# Patient Record
Sex: Female | Born: 1940 | Race: Black or African American | Hispanic: No | Marital: Married | Smoking: Never smoker
Health system: Southern US, Community
[De-identification: ages and names within clinical notes are randomized; demographics above are authoritative.]

## PROBLEM LIST (undated history)

## (undated) DIAGNOSIS — E785 Hyperlipidemia, unspecified: Secondary | ICD-10-CM

## (undated) DIAGNOSIS — I219 Acute myocardial infarction, unspecified: Secondary | ICD-10-CM

## (undated) DIAGNOSIS — I459 Conduction disorder, unspecified: Secondary | ICD-10-CM

## (undated) DIAGNOSIS — K219 Gastro-esophageal reflux disease without esophagitis: Secondary | ICD-10-CM

## (undated) DIAGNOSIS — I2510 Atherosclerotic heart disease of native coronary artery without angina pectoris: Secondary | ICD-10-CM

## (undated) DIAGNOSIS — I499 Cardiac arrhythmia, unspecified: Secondary | ICD-10-CM

## (undated) DIAGNOSIS — M545 Low back pain, unspecified: Secondary | ICD-10-CM

## (undated) DIAGNOSIS — I739 Peripheral vascular disease, unspecified: Secondary | ICD-10-CM

## (undated) DIAGNOSIS — I10 Essential (primary) hypertension: Secondary | ICD-10-CM

## (undated) DIAGNOSIS — E1142 Type 2 diabetes mellitus with diabetic polyneuropathy: Secondary | ICD-10-CM

## (undated) DIAGNOSIS — E119 Type 2 diabetes mellitus without complications: Secondary | ICD-10-CM

## (undated) DIAGNOSIS — H3500 Unspecified background retinopathy: Secondary | ICD-10-CM

## (undated) DIAGNOSIS — I447 Left bundle-branch block, unspecified: Secondary | ICD-10-CM

## (undated) DIAGNOSIS — R079 Chest pain, unspecified: Secondary | ICD-10-CM

## (undated) DIAGNOSIS — J309 Allergic rhinitis, unspecified: Secondary | ICD-10-CM

## (undated) DIAGNOSIS — I5040 Unspecified combined systolic (congestive) and diastolic (congestive) heart failure: Secondary | ICD-10-CM

## (undated) DIAGNOSIS — D649 Anemia, unspecified: Secondary | ICD-10-CM

## (undated) HISTORY — PX: ABDOMINAL HYSTERECTOMY: SHX81

## (undated) HISTORY — DX: Peripheral vascular disease, unspecified: I73.9

## (undated) HISTORY — DX: Type 2 diabetes mellitus with diabetic polyneuropathy: E11.42

## (undated) HISTORY — DX: Conduction disorder, unspecified: I45.9

## (undated) HISTORY — DX: Allergic rhinitis, unspecified: J30.9

## (undated) HISTORY — DX: Low back pain, unspecified: M54.50

## (undated) HISTORY — DX: Unspecified background retinopathy: H35.00

---

## 2003-03-07 ENCOUNTER — Inpatient Hospital Stay (HOSPITAL_COMMUNITY): Admission: EM | Admit: 2003-03-07 | Discharge: 2003-03-11 | Payer: Self-pay

## 2003-09-19 ENCOUNTER — Encounter: Payer: Self-pay | Admitting: Emergency Medicine

## 2003-09-20 ENCOUNTER — Observation Stay (HOSPITAL_COMMUNITY): Admission: EM | Admit: 2003-09-20 | Discharge: 2003-09-21 | Payer: Self-pay | Admitting: Emergency Medicine

## 2003-09-21 ENCOUNTER — Encounter (INDEPENDENT_AMBULATORY_CARE_PROVIDER_SITE_OTHER): Payer: Self-pay | Admitting: Cardiology

## 2005-01-01 ENCOUNTER — Ambulatory Visit: Payer: Self-pay | Admitting: Cardiology

## 2005-01-02 ENCOUNTER — Encounter (INDEPENDENT_AMBULATORY_CARE_PROVIDER_SITE_OTHER): Payer: Self-pay | Admitting: Cardiology

## 2005-01-02 ENCOUNTER — Inpatient Hospital Stay (HOSPITAL_COMMUNITY): Admission: EM | Admit: 2005-01-02 | Discharge: 2005-01-05 | Payer: Self-pay | Admitting: Emergency Medicine

## 2005-01-30 ENCOUNTER — Ambulatory Visit: Payer: Self-pay | Admitting: Cardiology

## 2005-07-17 ENCOUNTER — Ambulatory Visit: Payer: Self-pay | Admitting: Cardiology

## 2005-12-15 ENCOUNTER — Ambulatory Visit (HOSPITAL_COMMUNITY): Admission: RE | Admit: 2005-12-15 | Discharge: 2005-12-15 | Payer: Self-pay | Admitting: Cardiovascular Disease

## 2006-01-03 ENCOUNTER — Encounter: Admission: RE | Admit: 2006-01-03 | Discharge: 2006-01-03 | Payer: Self-pay | Admitting: Cardiovascular Disease

## 2009-04-09 ENCOUNTER — Emergency Department (HOSPITAL_COMMUNITY): Admission: EM | Admit: 2009-04-09 | Discharge: 2009-04-09 | Payer: Self-pay | Admitting: Emergency Medicine

## 2011-03-07 LAB — DIFFERENTIAL
Eosinophils Absolute: 0.1 10*3/uL (ref 0.0–0.7)
Lymphocytes Relative: 30 % (ref 12–46)
Lymphs Abs: 1.2 10*3/uL (ref 0.7–4.0)
Neutro Abs: 2.3 10*3/uL (ref 1.7–7.7)
Neutrophils Relative %: 59 % (ref 43–77)

## 2011-03-07 LAB — BASIC METABOLIC PANEL
Calcium: 9 mg/dL (ref 8.4–10.5)
Creatinine, Ser: 0.8 mg/dL (ref 0.4–1.2)
GFR calc Af Amer: >60 mL/min (ref >60)
Potassium: 3.7 mEq/L (ref 3.5–5.1)

## 2011-03-07 LAB — POCT CARDIAC MARKERS
CKMB, poc: 1.6 ng/mL (ref 1.0–8.0)
Myoglobin, poc: 57.6 ng/mL (ref 12–200)
Myoglobin, poc: 86 ng/mL (ref 12–200)
Troponin i, poc: <0.05 ng/mL (ref 0.00–0.09)

## 2011-03-07 LAB — BRAIN NATRIURETIC PEPTIDE: Pro B Natriuretic peptide (BNP): 66 pg/mL (ref 0.0–100.0)

## 2011-03-07 LAB — CBC
MCHC: 34.6 g/dL (ref 30.0–36.0)
Platelets: 187 10*3/uL (ref 150–400)
RBC: 4.16 MIL/uL (ref 3.87–5.11)
WBC: 3.9 10*3/uL — ABNORMAL LOW (ref 4.0–10.5)

## 2011-03-07 LAB — CULTURE, BLOOD (ROUTINE X 2): Culture: NO GROWTH

## 2011-04-14 NOTE — Cardiovascular Report (Signed)
NAME:  Amanda Stewart, Amanda Stewart                   ACCOUNT NO.:  [id number]   MEDICAL RECORD NO.:  [id number]          PATIENT TYPE:  INP   LOCATION:  2001                         FACILITY:  [facility]   PHYSICIAN:  Jonelle Sidle, M.D. LHCDATE OF BIRTH:  07/23/1945   DATE OF PROCEDURE:  01/04/2005  DATE OF DISCHARGE:                              CARDIAC CATHETERIZATION   CARDIOLOGIST:  Maisie Fus C. Wall, M.D.   INDICATION:  [name] is a 70 year old woman with a history of  hypertension, left bundle branch block pattern, dyslipidemia, type 2  diabetes mellitus, and probable diastolic heart failure.  She recently  presented with reported acute pulmonary edema and had mildly abnormal  troponin I levels.  She is referred now for diagnostic coronary angiography  to reevaluate coronary anatomy and assess left ventricular function.  The  patient reports a possible remote SHELLFISH allergy and has been pretreated  prior to the procedure.   PROCEDURES:  1.  Left heart catheterization.  2.  Selective coronary angiography.  3.  Left ventriculography.  4.  Selective renal arteriography.   ACCESS AND EQUIPMENT:  The area about the right femoral artery was  anesthetized with 1% lidocaine.  A 6-French sheath was placed in the right  femoral artery via the modified Seldinger technique.  Standard preformed 6-  [place] JL4 and JR4 catheters were used for selective coronary angiography.  The JR4 catheter was used for selective renal atriography as well.  An  angled pigtail catheter was used for left heart catheterization and left  ventriculography.  All exchanges were made over wire, and the patient  tolerated the procedure well without immediate complications.   HEMODYNAMICS:  1.  Left ventricle 168/23 mmHg.  2.  Aorta 168/83 mmHg.   ANGIOGRAPHIC FINDINGS:  1.  Left main coronary artery is free of significant flow-limiting coronary      atherosclerosis.  2.  The left anterior descending is a medium caliber  vessel with four very      small diagonal branches, the first of which is the largest.  There is no      significant flow-limiting coronary atherosclerosis within the system.  3.  There is a large bifurcating ramus intermedius branch noted without any      significant flow-limiting coronary atherosclerosis.  4.  The circumflex coronary artery is a large vessel that provides two large      obtuse marginal branches and posterolateral branch.  There is no      significant flow-limiting coronary atherosclerosis within this system.  5.  The right renal artery is medium in caliber and dominant.  There is no      significant flow-limiting coronary atherosclerosis within this system.  6.  Selective renal arteriography showed no significant proximal stenosis in      the left or right renal arteries.  7.  Left ventriculography was performed in the RAO projection and revealed      an ejection fraction calculated at 45% with global hypokinesis and no      significant mitral regurgitation.   DIAGNOSES:  1.  No  significant flow-limiting coronary atherosclerosis noted within the      major epicardial vessels as outlined.  2.  Left ventricular ejection fraction calculated at 45% with global      hypokinesis, no significant mitral regurgitation, and an elevated left      ventricular end-diastolic pressure of 23 mmHg.  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  3.  No significant proximal renal artery stenosis noted.   RECOMMENDATIONS:  Would continue risk factor modifications and intensify  medical therapy.      SGM/MEDQ  D:  01/04/2005  T:  01/04/2005  Job:  [id number]   cc:   Thomas C. Wall, M.D.

## 2011-04-14 NOTE — Consult Note (Signed)
NAME:  Amanda Stewart, Amanda Stewart                               ACCOUNT NO.:  [id number]   MEDICAL RECORD NO.:  [id number]                   PATIENT TYPE:  INP   LOCATION:  5524                                 FACILITY:  [facility]   PHYSICIAN:  Thomas C. Wall, M.D.                DATE OF BIRTH:  07/23/1945   DATE OF CONSULTATION:  DATE OF DISCHARGE:                                   CONSULTATION   REFERRING PHYSICIAN:  Madaline Guthrie, M.D., Gary Fleet, M.D.   REASON FOR CONSULTATION:  We were asked by the medical teaching service,  specifically Dr. Wyonia Hough and Dr. Aundria Rud, to evaluate Franne Grip with  congestive heart failure.   She is a 70 year old African-American female who was admitted on October 23  to the emergency room with dyspnea, nausea and vomiting, and diaphoresis.  She had a blood pressure of 211/108.  She had not been taking her  medications and had actually been taking some decongestants and cold  medicines.   She was hospitalized here on April 10th, 2004 with acute pulmonary edema.  This was felt to be secondary to hypertensive crisis.  She had a negative CT  for pulmonary embolus and dissection, and had an echo that showed an  ejection fraction of  40-50%.  Catheterization showed nonobstructive coronary artery disease.  The  patient was requested to follow up with [facility] and internal  medicine but did not follow up.   PAST MEDICAL HISTORY:  She has a chronic left bundle branch block pattern.  Diabetes was diagnosed in April 2004. She has gastroesophageal reflux and  chronic anemia.   ALLERGIES:  SHE IS INTOLERANCE TO SHRIMP, WHICH CAUSES ANAPHYLAXIS.   MEDICATIONS:  1. Clonidine 0.3 q.8h. here in-hospital.  2. Prinivil 20 mg a day.  3. Lasix 40 mg  a day.  4. She has been covered with Lovenox 40 mg a day and Protonix 40 mg a day as     well.   SOCIAL HISTORY:  She is widowed and lives in [place]. She has 4  daughters. She does not smoke, she occasionally  drinks alcohol.   She has allot of canned foods including canned soup, and does not seem to  know much about salt restriction.   FAMILY HISTORY:  Completely noncontributory.   REVIEW OF SYSTEMS:  Noncontributory.   PHYSICAL EXAMINATION:  VITAL SIGNS:  Today, blood pressure is 90/52, pulse  70 and regular, afebrile, respiratory rate 18.  GENERAL:  She is in no acute distress.  SKIN:  Warm and dry.  HEENT:  Unremarkable.  NECK:  Carotid upstrokes are equal bilaterally without bruits. No JVD.  LUNGS:  Clear.  CARDIOVASCULAR:  Regular rate and rhythm, no murmurs, rubs or gallops.  ABDOMEN:  Soft, good bowel sounds.  EXTREMITIES:  Pulses are decreased but present. There is no edema.  NEUROLOGIC:  Grossly intact.  LABORATORY DATA:  X-ray on October 23 showed congestive heart failure.  EKG  shows sinus tachycardia on admission at 108 beats per minute, she has a left  bundle with what looks like different P wave morphologies with question of  fusion beats. Repeat EKG by myself today shows minimal electrical variation  in her QRS's.  She is in normal sinus rhythm.   Cardiac enzymes were negative.  TSH is normal.  Hemoglobin A1c 7.3.  Cholesterol 248, LDL 171.  BNP 55.4.   CONCLUSION:  1. Diastolic congestive heart failure with acute exacerbation secondary to     hypertensive crisis, secondary to noncompliance and taking over-the-     counter decongestants most likely.  2. Hyperlipidemia.  With her other risk factors, I would probably use a low     dose statin if she will comply.  I would aim for an LDL at least less     than 130 if possible.  Crestor 5 mg a day or Lipitor 10 mg a day would     probably accomplish this.  3. Noncompliance. She also does not know much about salt restriction.  I     reviewed some of the basics with her.  Outpatient dietary consultation     might be helpful.   She had an echocardiogram done today. We will make sure she does not have a  pericardial  effusion, explained there are minimal electrical variations in  her QRS complexes.   MEDICATIONS RECOMMENDED FOR OUTPATIENT THERAPY:  1. Clonidine 0.2 b.i.d.  2. Enalapril 5 mg p.o. b.i.d. (This will decrease her cough substantially).   Thank you very much for the consultation.                                                Thomas C. Wall, M.D.    TCW/MEDQ  D:  09/21/2003  T:  09/21/2003  Job:  [id number]   cc:   McGehee Bing, M.D.   Gary Fleet, M.D.  [address]  [place]  [place] [address]  Fax: [phone]   Madaline Guthrie, M.D.  [address]  [place] [address]  Fax: [phone]   Larina Bras, Dr.

## 2011-04-14 NOTE — H&P (Signed)
NAME:  Robben, Nahlia                   ACCOUNT NO.:  [id number]   MEDICAL RECORD NO.:  [id number]          PATIENT TYPE:  INP   LOCATION:  1827                         FACILITY:  [facility]   PHYSICIAN:  Thomas C. Wall, M.D.   DATE OF BIRTH:  07/23/1945   DATE OF ADMISSION:  01/01/2005  DATE OF DISCHARGE:                                HISTORY & PHYSICAL   HISTORY OF PRESENT ILLNESS:  The patient was admitted to [facility]  Emergency Room on January 01, 2005, for acute pulmonary edema.  The patient  presented to the emergency department around 7 p.m. by EMS from home.  She  as at home with sudden onset of diaphoresis, feeling warm all over.  She  experienced some chest tightness with this and wheezing.  She states she  could not breath.  Went outside to get some fresh air.  Continued to feel a  smothering sensation, exact same situation that occurred in October 2004.  The patient states she sleeps on 3-4 pillows each evening.  This has not  changed.  Normally, the patient is very active.  She works 8 hour shifts at  [employer].  She uses the stairs when possible and plays golf.  This  weekend, however, she experienced increased stress at work, complained of a  headache, feeling nervous all day, unable to relax on Sunday.  Sunday  evening, a pot pie from [facility] and then a pre-Super Bowl Party, had salted  peanuts and a lot of water.  Her symptoms began about an hour later.   ALLERGIES:  No known drug allergies.  There was a questionable of shrimp  anaphylactics in the past; however, the patient experienced an episode of  acute pulmonary edema in October 2004 and on the same day she had eaten  shrimp, and it was thought, at that time, that she was having a reaction to  the shrimp.  However, patient assures me she has continued to eat shrimp and  other seafood since this occurrence without any side effects whatsoever.   MEDICATIONS:  1.  Clonidine 0.2 mg daily.  The patient states she takes  this occasionally.  2.  Fiber diet pill, over-the-counter.  3.  Enalapril 5 mg p.o. b.i.d.  The patient states she takes this      occasionally also.   HOSPITAL MEDICATIONS:  1.  Pepcid.  2.  Aspirin 325 mg.  3.  Enalapril 5 mg p.o. b.i.d.  4.  Clonidine 0.1 mg p.o. b.i.d.  5.  She received 100 mg of Lasix IV at 1945 last night and then 40 mg of IV      Lasix around 0900 this morning.   PAST MEDICAL HISTORY:  1.  Prior cardiac catheterization in October 2004, negative for disease.  No      history of CABG.  Her LVEF was 55-65% by echo in October 2004.  2.  Hypertension.  3.  Urgency in October 2004.  4.  Left bundle branch block on EKG.  5.  Cardiomyopathy.  6.  CHF with diastolic dysfunction.  7.  Diabetes mellitus type 2 which the patient is not being treated for due      to her own request not to take medication.  8.  Hypercholesterolemia in the past.   SOCIAL HISTORY:  Patient is a widow with two adult daughters alive and well.  She lives here in [place] alone.  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  She denies any tobacco use, ETOH  use, drug or herbal medication use.  Exercise:  Golf and walks with  daughters occasionally.  Diet:  The patient does not follow any diet  restrictions.  She actually has a somewhat high sodium diet intake and does  not watch her sugar intake.   FAMILY HISTORY:  Unknown as the patient is adopted.   REVIEW OF SYSTEMS:  Positive for sweat.  Cardiopulmonary:  Positive for  chest pain as described above.  Positive for shortness of breath and dyspnea  on exertion as described above.  As far as edema, the patient denies any  peripheral edema.  However, complains of occasional swelling and tightness  in her hands.  The patient complains of presyncope with BP medications.  However, there is no documented syncopal episodes.  Denies any claudication  or coughing.  Has had an episode of wheezing last p.m. on  this episode.  Endocrine:  Complains of heat intolerance.  Also, complains of hot flashes.   PHYSICAL EXAMINATION:  VITAL SIGNS:  Temperature 98, heart rate initially on  arrival to the emergency room was 133, currently she is 91.  Respirations  were initially 25.  She is currently 1.  Blood pressure initially 197/116.  She is currently 165/79.  She has voided 2700 cc since being admitted at 7  p.m. last night.  GENERAL APPEARANCE:  She is alert and oriented, no acute distress.  Resting  quietly in hospital bed.  HEENT:  Unremarkable.  HEART:  Regular rate and rhythm, no murmurs, rubs or gallops.  CHEST:  Decreased bilateral breast sounds throughout.  No significant  wheeziness or crackles audible.  ABDOMEN:  Soft and nontender, positive bowel sounds.  EXTREMITIES:  No cyanosis, clubbing or edema.  No spine or CVA tenderness.  NEUROLOGICAL:  She is alert and oriented x3.  Cranial nerves II-XII grossly  intact.  Strength 5/5 all extremities.   LABORATORY DATA:  Initial chest x-ray showed right lower lobe opacity, read  as right basilar infiltrate.  Follow up CT of the chest showed cardiomegaly  with diffuse bilateral air spaces, infiltrates bilaterally.  Negative for  pulmonary embolism.  EKG showing normal sinus rhythm with a rate of 83, PR  171, QRS 142, QTC 509.  Lab work showing a white blood cell of 10.9,  hemoglobin 14.4, hematocrit 41.3, platelets 292,000.  Sodium 140, potassium  3.8, BUN 21, creatinine 1.2 with a glucose of 226.  Liver enzymes normal.  First set of enzymes showing a CK MB of 13.2 and a troponin of 0.71.  D.  dimer of 0.93.  BNP done and initial lab work showing 33.  Dr. Daleen Squibb into  examine and evaluate the patient.   PROBLEMS:  #1 - ACUTE PULMONARY EDEMA.  History of diastolic dysfunction.  #2 - ELEVATED CARDIAC ENZYMES.  History of cardiac catheterization that  showed no obstructive coronary artery disease. #3 - MEDICAL COMPLIANCE.  Not taking medications due  to feeling funny with  blood pressure changes.  #4 - DIABETES.  Noncompliant with diet.  #5 - HYPERTENSION.  Uncontrolled.  This episode is probably related to  increased stress and high sodium diet with no blood pressure medication over  the last several days.  Similar episode in October 2004.   CONCLUSION:  Intermittently, the patient has been doing well.  No  exacerbations since 2004.  However, with her noncompliance due to the  effects of medication, it is my recommendation to discontinue the Clonidine  at discharge and place the patient on a low dose diuretic, maybe HCTZ 12.5  and combine with Lisinopril 10-20 mg.  The patient will probably tolerate  this medication regimen better and be more compliant with her medications.  She will definitely need follow up diet education.  We also recommend  checking the echocardiogram, cycling the cardiac enzymes.  If her CK MB's  are positive, she will definitely need to have a cardiac catheterization  done this admission.  Also, recommend repeating BNP.  Also, spoke with  patient and Dr. Daleen Squibb also spoke with the patient concerning her  follow up appointment.  She was suppose to be seen by our office in 2004  when she was discharged.  However, the patient never followed up for  outpatient evaluation.  We definitely need to make sure appointment is  arranged for her to see Dr. Daleen Squibb at the office upon discharge.      MB/MEDQ  D:  01/02/2005  T:  01/02/2005  Job:  [id number]

## 2011-04-14 NOTE — Discharge Summary (Signed)
NAME:  Amanda Stewart, Amanda Stewart                               ACCOUNT NO.:  [id number]   MEDICAL RECORD NO.:  [id number]                   PATIENT TYPE:  INP   LOCATION:  5524                                 FACILITY:  [facility]   PHYSICIAN:  Fransisco Hertz, M.D.               DATE OF BIRTH:  07/23/1945   DATE OF ADMISSION:  09/19/2003  DATE OF DISCHARGE:  09/21/2003                                 DISCHARGE SUMMARY   PRIMARY CARE PHYSICIAN:  Unassigned.   DISCHARGE DIAGNOSES:  1. Hypertension.  2. Hypertensive urgency.  3. Congestive heart failure.  4. Diabetes mellitus type 2, glycemia.  5. Hypercholesterolemia.   DISCHARGE MEDICATIONS:  1. Clonidine 0.2 mg p.o. b.i.d.  2. Enalapril 5 mg b.i.d.  She is to start taking this after she finishes her     home Avapro 300 mg p.o. daily.  3. Aspirin 325 mg p.o. daily.   DISCHARGE DIET:  She is to decrease her salt intake and try to limit  carbohydrates and sugars.  She will be provided a packet as an outpatient of  the ADA diet and she will first try to control her diabetes with lifestyle  and diet modification.   FOLLOWUP:  1. Ace Gins, MD at [facility] on Wednesday, November     17 at 2:45.  2. She is to have cardiac MRI.  Debra from [facility], M.D. office will     contact her to schedule this.  The number for [facility] is [phone].   FOLLOWUP RECOMMENDATIONS:  1. The patient with new diagnosis of diabetes mellitus and elects not to be     started on metformin 500 b.i.d., but would rather try lifestyle     modifications at this time.  Will recheck laboratories and consider     medication in the future.  2. The patient is getting a cardiac MRI for echocardiogram finding of     echodense areas of the pericardium.  3. Podiatrist consult.  The patient was found to have equal bilateral masses     on plantar surfaces of both feet.  Unsure of etiology of these lesions.     If this continues to bother her will refer her to  podiatrist.  4. Hyperlipidemia.  In April 2004 total cholesterol 191, TG 118, HDL 52, LDL     115.  She was discharged at that time on Zocor 20 mg q.h.s.  Will need to     restart this.  The patient does not like to take medications and plan to     add this as an outpatient.  5. The patient previously diagnosed with GERD and on Protonix 40 mg daily.     No evidence of this at the time, but just to be aware.   BRIEF HISTORY:  Amanda Stewart is a 70 year old black female who presented to the  emergency  department with chief complaint of shortness of breath.  She has  had a viral syndrome for the past three days, but an acute onset of  shortness of breath an hour before presentation to the ED associated with  nausea and vomiting and diarrhea.  She was gasping for breath and had had  chills and subjective fever, but no cough, no headache, no chest pain, no  vision changes.  In the ED was feeling much better with Lasix and oxygen  therapy.  At the time was found to have a blood pressure of 211/108 and a  pulse of 106.  She was saturating 94% on room air.  She gave the additional  history of not taking any of her previous discharge medications for the past  several months except for clonidine which she stopped taking two to three  weeks ago.  Admission medications were multivitamin, vitamin C, D, and iron  daily, and clonidine 0.1 mg t.i.d. as reported.  She has never smoked.  Occasionally uses alcohol.  She is widowed x6 years.  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  She lives alone, has four adult daughters,  and is very active in her community.   FAMILY HISTORY:  Noncontributory.   REVIEW OF SYSTEMS:  As above.   HOSPITAL COURSE:  Problem 1 - The patient admitted for CHF exacerbation as  chest x-ray showed increased bilateral cephalization, but without infiltrate  even though her BNP was only 55.4.  Also, admitted secondary to her  hypertensive urgency.  Hypertension was  relieved by her home dose of  clonidine as well as lisinopril 10 mg.  No other intervention was needed.  It was felt that her CHF was secondary to hypertension which may have been  increased secondary to some cold medicine that she had taken earlier in the  evening, likely worsening without medication for the past few weeks, but  acutely worse secondary to the medication.  Upon admission she was ruled out  for an MI with serial CK-MB, troponins.  Her echocardiogram showed sinus  tachycardia with right atrial enlargement, left bundle branch block, and  left axis deviation with some electrical alternans present.  She was seen by  Jesse Sans. Wall, M.D. of [facility] and above medications were agreed  upon and felt to be adequate for Ms. Andreoli.   Problem 2 - Upon admission her blood glucose was found to be elevated 175.  Her previous hemoglobin A1C in April was 7.1.  Hemoglobin A1C this  hospitalization found to be 7.3.  Her blood sugars were checked and ranged  from 149 to 198.  This hyperglycemia was decided upon by M.D. and patient  not to be treated at this time, but to try lifestyle modifications and  possibly place on Metformin as an outpatient.   PERTINENT LABORATORIES:  The patient's BMET was within normal limits with a  potassium 3.3, BUN 10, creatinine 0.8, normal glucose at 175.  Hemoglobin  13.9, hematocrit 41.6, white blood cells 6.6, platelets 253,000.  She had an  MCV of 90.8.  Liver function tests were normal including alkaline  phosphatase, SGOT, and SGPT.  Chest x-ray was as noted above.   CONSULTS:  [facility], Jesse Sans. Wall, M.D.   PROCEDURE:  Transthoracic echocardiogram which showed left ventricular  ejection fraction between 55-65%.  No wall motion abnormalities.  Wall  thickness moderately to markedly increased with relative increase contribution of atrial contraction to the left ventricular filling.  There  was a mild increase of aortic valve  thickness and mild mitral annular  calcification.  The visceral pericardium overlying the RV free wall and apex  appears somewhat echodense and recommended cardiac MRI for further  evaluation and there was a trivial pericardial effusion at the apex.   CONDITION ON DISCHARGE:  The patient was discharged in good and stable  condition and will follow up as above.     Ace Gins, MD                        Fransisco Hertz, M.D.   JS/MEDQ  D:  09/21/2003  T:  09/22/2003  Job:  [id number]

## 2011-04-14 NOTE — H&P (Signed)
NAME:  Amanda Stewart, Amanda Stewart                   ACCOUNT NO.:  [id number]   MEDICAL RECORD NO.:  [id number]          PATIENT TYPE:  EMS   LOCATION:  MAJO                         FACILITY:  [facility]   PHYSICIAN:  Gertha Calkin, M.D.DATE OF BIRTH:  07/23/1945   DATE OF ADMISSION:  01/01/2005  DATE OF DISCHARGE:                                HISTORY & PHYSICAL   PRIMARY CARE PHYSICIAN:  Unassigned.   HISTORY OF PRESENT ILLNESS:  This is a 70 year old African-American female  with limited understanding of her own medical problems, who has documented  left bundle branch block, hypertension, diastolic dysfunction,  nonobstructive cardiomyopathy, diabetes type 2, hypercholesterolemia, who  presents with acute shortness of breath approximately one hour prior to  admission.  She states she has had an identical admission with similar  symptoms in October of 2004.  Today, she claims that she has not taken her  Clonidine which is her only prescription in the last five days because this  prescription has run out from her previous hospitalization a year ago and  she has not followed up in an outpatient setting.  In her previous admission  in October of 2004, she had a complete thorough workup done and was admitted  by the internal medicine teaching service who consulted cardiology and  recommendations were made for medical management at that time.   PAST MEDICAL HISTORY:  1.  Hypertension.  2.  Left bundle branch block.  3.  Nonobstructive cardiac dysfunction (diastolic dysfunction).  4.  Diabetes type 2.  5.  Hyperlipidemia.   MEDICATIONS:  Currently only taking Clonidine 0.1 mg p.o. daily.   ALLERGIES:  No known drug allergies.   SOCIAL HISTORY:  She lives in [place].  She is a physical therapist and  denies tobacco, drug, or alcohol use.   FAMILY HISTORY:  The patient does not know of any family history.   REVIEW OF SYSTEMS:  Positive only for shortness of breath, no chest  tightness, fevers,  chills, cough, nausea and vomiting, constipation, or  diarrhea.  No headaches, weight loss, night sweats.  No sick contacts.  No  trauma history.  No blurry vision, no chest pain or chest tightness.  No  dizziness, vertigo, or falls.  Other review of systems are also negative.   PHYSICAL EXAMINATION:  VITAL SIGNS:  Temperature 98.8, blood pressure on  admission to the ED 197/116, most recent blood pressure is 153/77, pulse on  admission 133 and currently 98, her respirations are running in the low to  mid 20's, saturating approximately 100% on room air.  GENERAL:  She is lying in no acute distress.  HEENT:  Unremarkable.  HEART:  Regular rate and rhythm, no murmurs, rubs, or gallops.  CHEST:  Decreased bilateral breath sounds with rales approximately 1/3 up  both lung fields.  No significant wheezing noted.  ABDOMEN:  Soft, nontender, and nondistended with positive bowel sounds.  EXTREMITIES:  Without cyanosis, clubbing, or edema.  Pulses are intact, 2+,  symmetric upper and lower extremities.  NEUROLOGY:  Cranial nerves II-XII grossly intact without focal deficits.  Strength  deficits.   LABORATORY DATA:  Admission chest x-ray showed positive only for right lower  lobe opacity.  The impression was read as right basilar infiltrate.  Follow-  up CT of chest with contrast showed cardiomegaly with diffuse bilateral  essential air space infiltrates in both lungs, likely pulmonary edema.  No  evidence of pulmonary embolism.   White count 10.9, hemoglobin 14.4, hematocrit 41.3, MCV 91, platelets 292.  She has no neutrophilia or leukocytosis.  Her ISTAT showed a sodium of 140,  potassium 2.8, chloride 105, BUN 20, glucose 226, creatinine 1.4.  Point of  care markers x1 were negative.  D-dimer was elevated at 0.93 and a BNP of  33.  PT 12, INR 0.8, and PTT 28.   ASSESSMENT:  1.  Hypertensive urgency.  2.  Acute pulmonary edema.  3.  Diabetes x2.  4.  Hypercholesterolemia.  5.   Hyperlipidemia.  6.  Diastolic dysfunction.  7.  Left bundle branch block.  8.  Deep venous thrombosis and gastrointestinal prophylaxis.   PLAN:  Admit and rule out with enzymes, although, I do not think this is an  acute ischemic event.  Most likely this is similar to her previous admission  which was due to hypertension resulting in diastolic dysfunction and acute  pulmonary edema.  Two-dimensional echocardiogram will be ordered as well as  EKG to follow up admission EKG's (admission EKG's showed only normal sinus  rhythm with left bundle branch block, however, initially admission EKG was  noted to have atrial fibrillation with RVR rate in the 132).  Also check  CBG's, cover with sliding scale.  Will control her blood pressure using ACE  inhibitor and Clonidine.  Will diurese her with Lasix.  Follow up chest x-  ray in the morning and labs.  Also check a TSH level.  Will provide DVT and  GI prophylaxis during this admission.  We may need to consult cardiology if  there is a question regarding any abnormalities on her cardiac findings.      JD/MEDQ  D:  01/02/2005  T:  01/02/2005  Job:  [id number]   cc:   Thomas C. Wall, M.D.

## 2011-04-14 NOTE — Discharge Summary (Signed)
NAME:  Amanda Stewart, Amanda Stewart                   ACCOUNT NO.:  [id number]   MEDICAL RECORD NO.:  [id number]          PATIENT TYPE:  INP   LOCATION:  2001                         FACILITY:  [facility]   PHYSICIAN:  Mobolaji B. Bakare, M.D.DATE OF BIRTH:  07/23/1945   DATE OF ADMISSION:  01/01/2005  DATE OF DISCHARGE:  01/05/2005                                 DISCHARGE SUMMARY   FINAL DIAGNOSIS:  1.  Nonischemic cardiomyopathy.  2.  Congestive heart failure.  3.  Dyslipidemia.  4.  Hypertension.  5.  Diabetes mellitus.  6.  Diastolic dysfunction.  7.  Diabetic nephropathy.  8.  Left bundle branch block on electrocardiogram.   CONSULTATIONS:  Thomas C. Wall, M.D., cardiology   PROCEDURE:  1.  Cardiac catheterization which showed no significant flow limiting      coronary arteriosclerosis.  2.  Left ventricular ejection fraction of 45% with global hypokinesis.  No      significant mitral regurgitation.  Elevated left ventricular  end      diastolic pressure of 22 mmHg suggestive of nonischemic cardiomyopathy.      There was no significant proximal renal artery stenosis.   BRIEF HISTORY:  Please refer to admission history and physical.  In  addition, Ms. Straker has two medical record numbers, the second medical  record number is [id number], which is for a previous admission in April 2004.   Ms. Macfarlane presented to the emergency department with acute onset of  shortness of breath.  She is a lady who has not taken proper care of her  medical condition.  According to previous discharge summary in October 2004,  the patient was diagnosed with CHF, diabetes mellitus, and  hypercholesterolemia, diastolic dysfunction.  Since discharge from the  hospital, she has not followed up with any primary care physician.  She has  not been compliant with medications.  She intermittently takes her  medications which include Clonidine for high blood pressure.  She ran out of  Clonidine some days prior to presenting  with shortness of breath at the  emergency department and at that time, blood pressure was 197/116.  She was  heard to be in acute pulmonary congestion, clinically and radiologically.  She was dialyzed with IV Lasix and her symptoms improved.  She was admitted  for further evaluation and treatment.  Of note, is that she had an angiogram  done in April 2004, according to the second medical record, which did not  show any significant lesion.  In addition, the evening prior to  hospitalization, the patient had dietary indiscretion during the [employer]  game, she had lots of salty food.   PERTINENT PHYSICAL FINDINGS:  Initial vital signs with temperature 98.8,  blood pressure 197/116 which later improved to 153/77, pulse 153 which later  improved to 98, and O2 saturations 100% on non-rebreather oxygen at initial  evaluation.  The main findings were on respiratory examination, showed  decreased breath sounds bilaterally and crackles up to lower 1/3 on both  lung fields, there was no wheezing.  CVS:  S1 and S2 regular,  no murmurs,  gallops, and rubs.  Abdomen was nontender, nondistended, no palpable  organomegaly.  The rest of physical examination was within normal limits.   RADIOLOGICAL DATA:  Chest x-ray showed right lower lobe opacity and this was  followed up with a CT scan of the chest which showed cardiomegaly with  diffuse bilateral air space infiltrates in both lungs compatible with  pulmonary edema, no evidence of pulmonary embolism.  EKG showed normal sinus  rhythm with left bundle branch block, this is similar to old EKG in 2004.  WBC 10.9, hemoglobin 14.4, hematocrit 41.3, MCV 91, platelets 292,  neutrophils 50, lymphocytes 41.  BNP 33.  Cardiac markers showed slight bump  in troponin, initial troponin high was 0.60 which dropped to 0.38 and  initial increase in CK MB to 15.3 which dropped to 3.6.  Creatinine 1.4 and  BUN 20.  Glucose 226, chloride 105, potassium 3.8, sodium 140.   D-dimer  0.93.  TSH 1.456.  Liver function tests within normal limits.  Hemoglobin  A1C 7.8.  Urine drug screen negative.  Fasting lipid profile total  cholesterol 259, triglycerides 110, HDL 63, LDL 174.   HOSPITAL COURSE:  Ms. Chrismer was admitted for further evaluation after  receiving treatment in the emergency department with intravenous Lasix.  She  received about 100 mg Lasix in total and she dialyzed approximately 3 liters  of urine.  She felt quite well thereafter.  She was seen by cardiologist,  Dr. Valera Castle, and it was appropriate for her to be further evaluated for  myocardial ischemia in view of the bumped troponin and risk factors which  have been out of control.  She had cardiac catheterization and results are  as stated above.  She has a nonischemic cardiomyopathy and the plan is to  manage risk factors.  She was seen by diabetic educator and the patient was  counseled appropriately.  She was also set up to have outpatient diabetic  education.  In addition, the patient was started on Avandia while in the  hospital.  She would benefit from adding Glucophage, as well, but this was  deferred until she is 48 hours post cardiac catheterization.  She was  discharged home to start Glucophage as an outpatient.  She was started on  Lipitor.  The patient's blood pressure was controlled subsequently after  hospitalization.  In addition, she had a hypotensive episode with a blood  pressure in the 90s.  She was asymptomatic and this was as a result of over  aggressive diuresis, Lasix was discontinued and the patient was given 200 mL  normal saline and blood pressure improved to 110 to 120 systolic.  It was  noted on urinalysis that the patient has a proteinuria of greater than 300  and it was suspected that she has diabetic nephropathy.  Overall, the  patient did well and she was discharged home in stable condition, ambulating without any chest pain, hemodynamically stable.  Blood  pressure at discharge  was 141/75 and creatinine was 1, BUN 19.   DISCHARGE MEDICATIONS:  Lisinopril 10 mg p.o. daily, Coreg 3.125 mg b.i.d.,  hydrochlorothiazide 12.5 mg daily, Avandia 4 mg daily, Glucophage 850 mg  daily, Lipitor 20 mg daily.   DISCHARGE INSTRUCTIONS:  She was instructed on low salt, low cholesterol,  low carbohydrate diet.  Follow up with Dr. Renaye Rakers next week.  The  patient actually scheduled an appointment with Dr. Renaye Rakers as a new  patient.  Follow up  with Dr. Daleen Squibb on February 28 at 4:15 p.m.  Follow up  with diabetic education as instructed.      MBB/MEDQ  D:  01/10/2005  T:  01/10/2005  Job:  [id number]   cc:   Renaye Rakers, M.D.  [phone] [address], [address]  [place]  [place] [address]  Fax: [phone]   Jesse Sans. Wall, M.D.

## 2014-05-23 ENCOUNTER — Inpatient Hospital Stay (HOSPITAL_COMMUNITY)
Admission: EM | Admit: 2014-05-23 | Discharge: 2014-06-01 | DRG: 280 | Disposition: A | Discharge status: Home Health | Payer: Medicare Other | Attending: Cardiovascular Disease | Admitting: Cardiovascular Disease

## 2014-05-23 ENCOUNTER — Emergency Department (HOSPITAL_COMMUNITY): Payer: Medicare Other

## 2014-05-23 DIAGNOSIS — N179 Acute kidney failure, unspecified: Secondary | ICD-10-CM | POA: Diagnosis present

## 2014-05-23 DIAGNOSIS — Z91013 Allergy to seafood: Secondary | ICD-10-CM

## 2014-05-23 DIAGNOSIS — I509 Heart failure, unspecified: Secondary | ICD-10-CM | POA: Diagnosis present

## 2014-05-23 DIAGNOSIS — E119 Type 2 diabetes mellitus without complications: Secondary | ICD-10-CM | POA: Diagnosis present

## 2014-05-23 DIAGNOSIS — J95811 Postprocedural pneumothorax: Secondary | ICD-10-CM | POA: Diagnosis present

## 2014-05-23 DIAGNOSIS — I4901 Ventricular fibrillation: Secondary | ICD-10-CM | POA: Diagnosis present

## 2014-05-23 DIAGNOSIS — R570 Cardiogenic shock: Secondary | ICD-10-CM | POA: Diagnosis present

## 2014-05-23 DIAGNOSIS — N289 Disorder of kidney and ureter, unspecified: Secondary | ICD-10-CM

## 2014-05-23 DIAGNOSIS — I214 Non-ST elevation (NSTEMI) myocardial infarction: Principal | ICD-10-CM | POA: Diagnosis present

## 2014-05-23 DIAGNOSIS — I4460 Unspecified fascicular block: Secondary | ICD-10-CM | POA: Diagnosis present

## 2014-05-23 DIAGNOSIS — Z9119 Patient's noncompliance with other medical treatment and regimen: Secondary | ICD-10-CM

## 2014-05-23 DIAGNOSIS — E872 Acidosis, unspecified: Secondary | ICD-10-CM | POA: Diagnosis present

## 2014-05-23 DIAGNOSIS — J9601 Acute respiratory failure with hypoxia: Secondary | ICD-10-CM

## 2014-05-23 DIAGNOSIS — I10 Essential (primary) hypertension: Secondary | ICD-10-CM | POA: Diagnosis present

## 2014-05-23 DIAGNOSIS — I5021 Acute systolic (congestive) heart failure: Secondary | ICD-10-CM | POA: Diagnosis present

## 2014-05-23 DIAGNOSIS — I469 Cardiac arrest, cause unspecified: Secondary | ICD-10-CM

## 2014-05-23 DIAGNOSIS — J690 Pneumonitis due to inhalation of food and vomit: Secondary | ICD-10-CM | POA: Diagnosis present

## 2014-05-23 DIAGNOSIS — E785 Hyperlipidemia, unspecified: Secondary | ICD-10-CM | POA: Diagnosis present

## 2014-05-23 DIAGNOSIS — J9600 Acute respiratory failure, unspecified whether with hypoxia or hypercapnia: Secondary | ICD-10-CM | POA: Diagnosis present

## 2014-05-23 DIAGNOSIS — R51 Headache: Secondary | ICD-10-CM | POA: Diagnosis not present

## 2014-05-23 DIAGNOSIS — I447 Left bundle-branch block, unspecified: Secondary | ICD-10-CM | POA: Diagnosis present

## 2014-05-23 DIAGNOSIS — I442 Atrioventricular block, complete: Secondary | ICD-10-CM

## 2014-05-23 DIAGNOSIS — Z7982 Long term (current) use of aspirin: Secondary | ICD-10-CM

## 2014-05-23 DIAGNOSIS — Z8674 Personal history of sudden cardiac arrest: Secondary | ICD-10-CM

## 2014-05-23 DIAGNOSIS — K219 Gastro-esophageal reflux disease without esophagitis: Secondary | ICD-10-CM | POA: Diagnosis present

## 2014-05-23 DIAGNOSIS — I428 Other cardiomyopathies: Secondary | ICD-10-CM | POA: Diagnosis present

## 2014-05-23 DIAGNOSIS — E876 Hypokalemia: Secondary | ICD-10-CM | POA: Diagnosis not present

## 2014-05-23 DIAGNOSIS — I2510 Atherosclerotic heart disease of native coronary artery without angina pectoris: Secondary | ICD-10-CM | POA: Diagnosis present

## 2014-05-23 DIAGNOSIS — Z91199 Patient's noncompliance with other medical treatment and regimen due to unspecified reason: Secondary | ICD-10-CM

## 2014-05-23 HISTORY — DX: Left bundle-branch block, unspecified: I44.7

## 2014-05-23 HISTORY — DX: Gastro-esophageal reflux disease without esophagitis: K21.9

## 2014-05-23 HISTORY — DX: Unspecified combined systolic (congestive) and diastolic (congestive) heart failure: I50.40

## 2014-05-23 HISTORY — DX: Acute myocardial infarction, unspecified: I21.9

## 2014-05-23 HISTORY — DX: Chest pain, unspecified: R07.9

## 2014-05-23 HISTORY — DX: Type 2 diabetes mellitus without complications: E11.9

## 2014-05-23 HISTORY — DX: Atherosclerotic heart disease of native coronary artery without angina pectoris: I25.10

## 2014-05-23 HISTORY — DX: Essential (primary) hypertension: I10

## 2014-05-23 HISTORY — DX: Hyperlipidemia, unspecified: E78.5

## 2014-05-23 HISTORY — DX: Cardiac arrhythmia, unspecified: I49.9

## 2014-05-23 HISTORY — DX: Anemia, unspecified: D64.9

## 2014-05-23 LAB — I-STAT ARTERIAL BLOOD GAS, ED
Acid-base deficit: 18 mmol/L — ABNORMAL HIGH (ref 0.0–2.0)
Bicarbonate: 15.4 mEq/L — ABNORMAL LOW (ref 20.0–24.0)
O2 SAT: 96 %
PCO2 ART: 78.3 mmHg — AB (ref 35.0–45.0)
PH ART: 6.902 — AB (ref 7.350–7.450)
TCO2: 18 mmol/L (ref 0–100)
pO2, Arterial: 144 mmHg — ABNORMAL HIGH (ref 80.0–100.0)

## 2014-05-23 LAB — I-STAT CHEM 8, ED
BUN: 14 mg/dL (ref 6–23)
Calcium, Ion: 1.09 mmol/L — ABNORMAL LOW (ref 1.13–1.30)
Chloride: 106 mEq/L (ref 96–112)
Creatinine, Ser: 1.2 mg/dL — ABNORMAL HIGH (ref 0.50–1.10)
GLUCOSE: 408 mg/dL — AB (ref 70–99)
HCT: 39 % (ref 36.0–46.0)
HEMOGLOBIN: 13.3 g/dL (ref 12.0–15.0)
Potassium: 3.6 mEq/L — ABNORMAL LOW (ref 3.7–5.3)
SODIUM: 139 meq/L (ref 137–147)
TCO2: 18 mmol/L (ref 0–100)

## 2014-05-23 LAB — COMPREHENSIVE METABOLIC PANEL
ALT: 70 U/L — ABNORMAL HIGH (ref 0–35)
AST: 108 U/L — ABNORMAL HIGH (ref 0–37)
Albumin: 3 g/dL — ABNORMAL LOW (ref 3.5–5.2)
Alkaline Phosphatase: 144 U/L — ABNORMAL HIGH (ref 39–117)
BUN: 13 mg/dL (ref 6–23)
CALCIUM: 8.5 mg/dL (ref 8.4–10.5)
CO2: 15 meq/L — AB (ref 19–32)
Chloride: 99 mEq/L (ref 96–112)
Creatinine, Ser: 1.09 mg/dL (ref 0.50–1.10)
GFR calc Af Amer: 58 mL/min — ABNORMAL LOW (ref >90)
GFR, EST NON AFRICAN AMERICAN: 50 mL/min — AB (ref >90)
GLUCOSE: 417 mg/dL — AB (ref 70–99)
Potassium: 3.9 mEq/L (ref 3.7–5.3)
Sodium: 141 mEq/L (ref 137–147)
TOTAL PROTEIN: 6.4 g/dL (ref 6.0–8.3)
Total Bilirubin: <0.2 mg/dL — ABNORMAL LOW (ref 0.3–1.2)

## 2014-05-23 LAB — I-STAT TROPONIN, ED: TROPONIN I, POC: 0.06 ng/mL (ref 0.00–0.08)

## 2014-05-23 MED ORDER — PROPOFOL 10 MG/ML IV EMUL
INTRAVENOUS | Status: AC
  Administered 2014-05-23: 10 ug/kg/min via INTRAVENOUS
  Filled 2014-05-23: qty 100

## 2014-05-23 MED ORDER — EPINEPHRINE HCL 0.1 MG/ML IJ SOSY
PREFILLED_SYRINGE | INTRAMUSCULAR | Status: DC | PRN
  Administered 2014-05-23: 1 mg via INTRAVENOUS

## 2014-05-23 MED ORDER — SODIUM CHLORIDE 0.9 % IV BOLUS (SEPSIS): 1000.0000 mL | Freq: Once | INTRAVENOUS | Status: DC

## 2014-05-23 MED ORDER — PROPOFOL 10 MG/ML IV EMUL
5.0000 ug/kg/min | Freq: Once | INTRAVENOUS | Status: DC
  Administered 2014-05-23 – 2014-05-24 (×3): 10 ug/kg/min via INTRAVENOUS
  Administered 2014-05-24: 5 ug/kg/min via INTRAVENOUS
  Administered 2014-05-24 (×4): 10 ug/kg/min via INTRAVENOUS

## 2014-05-23 NOTE — Progress Notes (Signed)
Chaplain escorted family to consultation room B per request.  Chaplain updated attending physician of family location for update.  Chaplain also checked in with family and they said they "were all right for right now."  Chaplain services are available in the future if need be but, initial assessment is that family seems reserved and not in need of spiritual support during this time.

## 2014-05-23 NOTE — ED Notes (Signed)
Pt in sinus tachycardia on the monitor, pulses reflect the same.  Dr.  Zachery Conch and Dr. Silverio Lay aware.

## 2014-05-23 NOTE — ED Notes (Addendum)
Pt asystole on the monitor, Dr. Zachery Conch at the bedside able to palpate pulse.  Asystole lasts 5 seconds, pacing resumed.

## 2014-05-23 NOTE — ED Notes (Signed)
Pt called out to EMS for SOB, while EMS on scene, pt had "seizure like activity, eyes rolled in the back of her head and she didn't have a pulse."  CPR started, 3 rounds of epinephrine, 2 shocks and after 15 minutes of CPR, pulse was regained.  Pt arrives unconscious, artificial airway airway present and pt is being paced at a rhythm of 60 beats per minute.

## 2014-05-23 NOTE — ED Provider Notes (Addendum)
CSN: [id number]     Arrival date & time 05/23/14  2237 History   First MD Initiated Contact with Patient 05/23/14 2250     Chief Complaint  Patient presents with  . Cardiac Arrest     (Consider location/radiation/quality/duration/timing/severity/associated sxs/prior Treatment) The history is provided by the patient.  Amanda Stewart is a 73 y.o. female hx of CAD, CHF, DM here with post cardiac arrest. Patient had shortness of breath. EMS noticed that her eyes rolled back. She then had cardiac arrest. EMS performed 15 min CPR. Then developed V fib and was shocked twice. She had king airway in on arrival. She known CAD.   Level V caveat- AMS, condition of patient    No past medical history on file. No past surgical history on file. No family history on file. History  Substance Use Topics  . Smoking status: Not on file  . Smokeless tobacco: Not on file  . Alcohol Use: Not on file   OB History   No data available     Review of Systems  Unable to perform ROS: Intubated      Allergies  Review of patient's allergies indicates no known allergies.  Home Medications   Prior to Admission medications   Medication Sig Start Date End Date Taking? Authorizing Provider  aspirin EC 81 MG tablet Take 81 mg by mouth daily.   Yes Historical Provider, MD  carvedilol (COREG) 12.5 MG tablet Take 12.5 mg by mouth 2 (two) times daily with a meal.   Yes Historical Provider, MD  linagliptin (TRADJENTA) 5 MG TABS tablet Take 5 mg by mouth daily.   Yes Historical Provider, MD  lisinopril-hydrochlorothiazide (PRINZIDE,ZESTORETIC) 10-12.5 MG per tablet Take 1 tablet by mouth daily.   Yes Historical Provider, MD  metFORMIN (GLUCOPHAGE) 500 MG tablet Take 500 mg by mouth 2 (two) times daily with a meal.   Yes Historical Provider, MD   BP 125/81  Pulse 98  Temp(Src) 97.3 F (36.3 C) (Axillary)  Resp 35  Ht 5\' 4"  (1.626 m)  Wt 180 lb (81.647 kg)  BMI 30.88 kg/m2  SpO2 98% Physical Exam  Nursing note  and vitals reviewed. Constitutional:  Altered. King airway in place   HENT:  Head: Normocephalic.  Eyes: Pupils are equal, round, and reactive to light.  Neck: Neck supple.  Cardiovascular: Regular rhythm and normal heart sounds.   Pulmonary/Chest: Effort normal.  Bilateral breath sounds   Abdominal: Soft. Bowel sounds are normal. She exhibits no distension. There is no tenderness. There is no rebound.  Musculoskeletal: Normal range of motion. She exhibits edema.  Neurological:  Sedated   Skin: Skin is warm.  Psychiatric:  Unable     ED Course  Procedures (including critical care time)  INTUBATION Performed by: Silverio Lay, DAVID  Required items: required blood products, implants, devices, and special equipment available Patient identity confirmed: provided demographic data and hospital-assigned identification number Time out: Immediately prior to procedure a "time out" was called to verify the correct patient, procedure, equipment, support staff and site/side marked as required.  Indications: hypoxia, post arrest  Intubation method:  Direct Laryngoscopy   Preoxygenation: BVM   Tube Size: 7.5 cuffed  Post-procedure assessment: chest rise and ETCO2 monitor Breath sounds: equal and absent over the epigastrium Tube secured with: ETT holder Chest x-ray interpreted by radiologist and me.  Chest x-ray findings: endotracheal tube in appropriate position  Patient tolerated the procedure well with no immediate complications.   CRITICAL CARE Performed by: Chaney Malling  Total critical care time: 30 min   Critical care time was exclusive of separately billable procedures and treating other patients.  Critical care was necessary to treat or prevent imminent or life-threatening deterioration.  Critical care was time spent personally by me on the following activities: development of treatment plan with patient and/or surrogate as well as nursing, discussions with consultants,  evaluation of patient's response to treatment, examination of patient, obtaining history from patient or surrogate, ordering and performing treatments and interventions, ordering and review of laboratory studies, ordering and review of radiographic studies, pulse oximetry and re-evaluation of patient's condition.  Cardiopulmonary Resuscitation (CPR) Procedure Note Directed/Performed by: Chaney MallingYAO, DAVID I personally directed ancillary staff and/or performed CPR in an effort to regain return of spontaneous circulation and to maintain cardiac, neuro and systemic perfusion.    Labs Review Labs Reviewed  CBC WITH DIFFERENTIAL - Abnormal; Notable for the following:    WBC 14.6 (*)    RBC 3.77 (*)    Hemoglobin 11.7 (*)    MCV 101.3 (*)    All other components within normal limits  COMPREHENSIVE METABOLIC PANEL - Abnormal; Notable for the following:    CO2 15 (*)    Glucose, Bld 417 (*)    Albumin 3.0 (*)    AST 108 (*)    ALT 70 (*)    Alkaline Phosphatase 144 (*)    Total Bilirubin <0.2 (*)    GFR calc non Af Amer 50 (*)    GFR calc Af Amer 58 (*)    All other components within normal limits  I-STAT ARTERIAL BLOOD GAS, ED - Abnormal; Notable for the following:    pH, Arterial 6.902 (*)    pCO2 arterial 78.3 (*)    pO2, Arterial 144.0 (*)    Bicarbonate 15.4 (*)    Acid-base deficit 18.0 (*)    All other components within normal limits  I-STAT CHEM 8, ED - Abnormal; Notable for the following:    Potassium 3.6 (*)    Creatinine, Ser 1.20 (*)    Glucose, Bld 408 (*)    Calcium, Ion 1.09 (*)    All other components within normal limits  BLOOD GAS, ARTERIAL  LACTIC ACID, PLASMA  I-STAT TROPOININ, ED    Imaging Review No results found.   EKG Interpretation   Date/Time:  Saturday May 23 2014 22:48:20 EDT Ventricular Rate:  90 PR Interval:    QRS Duration: 140 QT Interval:  472 QTC Calculation: 578 R Axis:   124 Text Interpretation:  Complete AV block with wide QRS complex  RBBB and  LPFB Baseline wander in lead(s) I V2 V3 paced. complete heart block   Confirmed by YAO  MD, DAVID ([id number]) on 05/23/2014 11:46:51 PM      MDM   Final diagnoses:  None   Amanda Stewart is a 73 y.o. female hx of CAD here s/p arrest. Patient intubated by me. Briefly off pacing and became asystolic and had one round of epi and about one minute of CPR. Patient was pacer dependent and has underlying complete heart block. I called cardiology fellow initially. Patient turned out to be Dr. Roseanne KaufmanKadakia's patient. I called Dr. Sharyn LullHarwani. He will come to assess the patient. Will admit for post cardiac arrest, complete heart block, likely cardiogenic shock.      Richardean Canalavid H Yao, MD 05/23/14 2358  Richardean Canalavid H Yao, MD 05/24/14 [phone]

## 2014-05-23 NOTE — ED Notes (Signed)
Pt intubated with 7.5 ET tube, 24cm at the teeth with positive color change and lung sounds auscultated.

## 2014-05-24 ENCOUNTER — Inpatient Hospital Stay (HOSPITAL_COMMUNITY): Payer: Medicare Other

## 2014-05-24 ENCOUNTER — Encounter (HOSPITAL_COMMUNITY): Payer: Self-pay | Admitting: Emergency Medicine

## 2014-05-24 DIAGNOSIS — Z9119 Patient's noncompliance with other medical treatment and regimen: Secondary | ICD-10-CM | POA: Diagnosis not present

## 2014-05-24 DIAGNOSIS — E119 Type 2 diabetes mellitus without complications: Secondary | ICD-10-CM | POA: Diagnosis present

## 2014-05-24 DIAGNOSIS — Z8674 Personal history of sudden cardiac arrest: Secondary | ICD-10-CM | POA: Diagnosis not present

## 2014-05-24 DIAGNOSIS — K219 Gastro-esophageal reflux disease without esophagitis: Secondary | ICD-10-CM | POA: Diagnosis present

## 2014-05-24 DIAGNOSIS — I447 Left bundle-branch block, unspecified: Secondary | ICD-10-CM | POA: Diagnosis present

## 2014-05-24 DIAGNOSIS — R51 Headache: Secondary | ICD-10-CM | POA: Diagnosis not present

## 2014-05-24 DIAGNOSIS — R570 Cardiogenic shock: Secondary | ICD-10-CM | POA: Diagnosis present

## 2014-05-24 DIAGNOSIS — I509 Heart failure, unspecified: Secondary | ICD-10-CM | POA: Diagnosis present

## 2014-05-24 DIAGNOSIS — I10 Essential (primary) hypertension: Secondary | ICD-10-CM | POA: Diagnosis present

## 2014-05-24 DIAGNOSIS — N179 Acute kidney failure, unspecified: Secondary | ICD-10-CM | POA: Diagnosis present

## 2014-05-24 DIAGNOSIS — I428 Other cardiomyopathies: Secondary | ICD-10-CM | POA: Diagnosis present

## 2014-05-24 DIAGNOSIS — I469 Cardiac arrest, cause unspecified: Secondary | ICD-10-CM | POA: Diagnosis present

## 2014-05-24 DIAGNOSIS — J9601 Acute respiratory failure with hypoxia: Secondary | ICD-10-CM

## 2014-05-24 DIAGNOSIS — R6521 Severe sepsis with septic shock: Secondary | ICD-10-CM

## 2014-05-24 DIAGNOSIS — E872 Acidosis, unspecified: Secondary | ICD-10-CM | POA: Diagnosis present

## 2014-05-24 DIAGNOSIS — A419 Sepsis, unspecified organism: Secondary | ICD-10-CM

## 2014-05-24 DIAGNOSIS — Z91199 Patient's noncompliance with other medical treatment and regimen due to unspecified reason: Secondary | ICD-10-CM | POA: Diagnosis not present

## 2014-05-24 DIAGNOSIS — N289 Disorder of kidney and ureter, unspecified: Secondary | ICD-10-CM

## 2014-05-24 DIAGNOSIS — Z91013 Allergy to seafood: Secondary | ICD-10-CM | POA: Diagnosis not present

## 2014-05-24 DIAGNOSIS — J95811 Postprocedural pneumothorax: Secondary | ICD-10-CM | POA: Diagnosis present

## 2014-05-24 DIAGNOSIS — E785 Hyperlipidemia, unspecified: Secondary | ICD-10-CM | POA: Diagnosis present

## 2014-05-24 DIAGNOSIS — I442 Atrioventricular block, complete: Secondary | ICD-10-CM | POA: Diagnosis present

## 2014-05-24 DIAGNOSIS — E876 Hypokalemia: Secondary | ICD-10-CM | POA: Diagnosis not present

## 2014-05-24 DIAGNOSIS — J690 Pneumonitis due to inhalation of food and vomit: Secondary | ICD-10-CM

## 2014-05-24 DIAGNOSIS — I4460 Unspecified fascicular block: Secondary | ICD-10-CM | POA: Diagnosis present

## 2014-05-24 DIAGNOSIS — I2510 Atherosclerotic heart disease of native coronary artery without angina pectoris: Secondary | ICD-10-CM | POA: Diagnosis present

## 2014-05-24 DIAGNOSIS — I5021 Acute systolic (congestive) heart failure: Secondary | ICD-10-CM | POA: Diagnosis present

## 2014-05-24 DIAGNOSIS — I214 Non-ST elevation (NSTEMI) myocardial infarction: Secondary | ICD-10-CM | POA: Diagnosis present

## 2014-05-24 DIAGNOSIS — J9600 Acute respiratory failure, unspecified whether with hypoxia or hypercapnia: Secondary | ICD-10-CM | POA: Diagnosis present

## 2014-05-24 DIAGNOSIS — Z7982 Long term (current) use of aspirin: Secondary | ICD-10-CM | POA: Diagnosis not present

## 2014-05-24 DIAGNOSIS — I4901 Ventricular fibrillation: Secondary | ICD-10-CM | POA: Diagnosis present

## 2014-05-24 LAB — BASIC METABOLIC PANEL
BUN: 15 mg/dL (ref 6–23)
BUN: 21 mg/dL (ref 6–23)
CALCIUM: 8.6 mg/dL (ref 8.4–10.5)
CO2: 17 mEq/L — ABNORMAL LOW (ref 19–32)
CO2: 22 mEq/L (ref 19–32)
CREATININE: 0.85 mg/dL (ref 0.50–1.10)
Calcium: 9 mg/dL (ref 8.4–10.5)
Chloride: 100 mEq/L (ref 96–112)
Chloride: 100 mEq/L (ref 96–112)
Creatinine, Ser: 1.09 mg/dL (ref 0.50–1.10)
GFR calc Af Amer: 57 mL/min — ABNORMAL LOW (ref >90)
GFR calc non Af Amer: 66 mL/min — ABNORMAL LOW (ref >90)
GFR, EST AFRICAN AMERICAN: 76 mL/min — AB (ref >90)
GFR, EST NON AFRICAN AMERICAN: 49 mL/min — AB (ref >90)
GLUCOSE: 122 mg/dL — AB (ref 70–99)
Glucose, Bld: 383 mg/dL — ABNORMAL HIGH (ref 70–99)
POTASSIUM: 5.8 meq/L — AB (ref 3.7–5.3)
Potassium: 4.6 mEq/L (ref 3.7–5.3)
SODIUM: 139 meq/L (ref 137–147)
Sodium: 138 mEq/L (ref 137–147)

## 2014-05-24 LAB — CBC WITH DIFFERENTIAL/PLATELET
BASOS PCT: 0 % (ref 0–1)
Basophils Absolute: 0 10*3/uL (ref 0.0–0.1)
EOS ABS: 0.1 10*3/uL (ref 0.0–0.7)
EOS PCT: 1 % (ref 0–5)
HEMATOCRIT: 38.2 % (ref 36.0–46.0)
HEMOGLOBIN: 11.7 g/dL — AB (ref 12.0–15.0)
Lymphocytes Relative: 57 % — ABNORMAL HIGH (ref 12–46)
Lymphs Abs: 8.4 10*3/uL — ABNORMAL HIGH (ref 0.7–4.0)
MCH: 31 pg (ref 26.0–34.0)
MCHC: 30.6 g/dL (ref 30.0–36.0)
MCV: 101.3 fL — ABNORMAL HIGH (ref 78.0–100.0)
MONO ABS: 0.4 10*3/uL (ref 0.1–1.0)
Monocytes Relative: 3 % (ref 3–12)
Neutro Abs: 5.7 10*3/uL (ref 1.7–7.7)
Neutrophils Relative %: 39 % — ABNORMAL LOW (ref 43–77)
Platelets: 227 10*3/uL (ref 150–400)
RBC: 3.77 MIL/uL — ABNORMAL LOW (ref 3.87–5.11)
RDW: 13.2 % (ref 11.5–15.5)
WBC: 14.6 10*3/uL — ABNORMAL HIGH (ref 4.0–10.5)

## 2014-05-24 LAB — LACTIC ACID, PLASMA
Lactic Acid, Venous: 1.4 mmol/L (ref 0.5–2.2)
Lactic Acid, Venous: 11.8 mmol/L — ABNORMAL HIGH (ref 0.5–2.2)

## 2014-05-24 LAB — POCT I-STAT 3, ART BLOOD GAS (G3+)
Acid-base deficit: 6 mmol/L — ABNORMAL HIGH (ref 0.0–2.0)
BICARBONATE: 21.8 meq/L (ref 20.0–24.0)
O2 Saturation: 94 %
PCO2 ART: 53.7 mmHg — AB (ref 35.0–45.0)
PH ART: 7.222 — AB (ref 7.350–7.450)
PO2 ART: 92 mmHg (ref 80.0–100.0)
TCO2: 23 mmol/L (ref 0–100)

## 2014-05-24 LAB — GLUCOSE, CAPILLARY
GLUCOSE-CAPILLARY: 133 mg/dL — AB (ref 70–99)
GLUCOSE-CAPILLARY: 146 mg/dL — AB (ref 70–99)
GLUCOSE-CAPILLARY: 148 mg/dL — AB (ref 70–99)
GLUCOSE-CAPILLARY: 146 mg/dL — AB (ref 70–99)
Glucose-Capillary: 407 mg/dL — ABNORMAL HIGH (ref 70–99)
Glucose-Capillary: 395 mg/dL — ABNORMAL HIGH (ref 70–99)
Glucose-Capillary: 357 mg/dL — ABNORMAL HIGH (ref 70–99)
Glucose-Capillary: 284 mg/dL — ABNORMAL HIGH (ref 70–99)
Glucose-Capillary: 254 mg/dL — ABNORMAL HIGH (ref 70–99)
Glucose-Capillary: 183 mg/dL — ABNORMAL HIGH (ref 70–99)
Glucose-Capillary: 114 mg/dL — ABNORMAL HIGH (ref 70–99)
Glucose-Capillary: 113 mg/dL — ABNORMAL HIGH (ref 70–99)
Glucose-Capillary: 115 mg/dL — ABNORMAL HIGH (ref 70–99)
Glucose-Capillary: 122 mg/dL — ABNORMAL HIGH (ref 70–99)
Glucose-Capillary: 151 mg/dL — ABNORMAL HIGH (ref 70–99)
Glucose-Capillary: 136 mg/dL — ABNORMAL HIGH (ref 70–99)

## 2014-05-24 LAB — TROPONIN I
Troponin I: 0.4 ng/mL (ref <0.30)
Troponin I: 0.42 ng/mL (ref <0.30)

## 2014-05-24 LAB — BLOOD GAS, ARTERIAL
Acid-base deficit: 5.1 mmol/L — ABNORMAL HIGH (ref 0.0–2.0)
Bicarbonate: 19.4 mEq/L — ABNORMAL LOW (ref 20.0–24.0)
DRAWN BY: 331761
FIO2: 0.8 %
O2 SAT: 97.7 %
PATIENT TEMPERATURE: 100.4
PEEP: 5 cmH2O
RATE: 32 resp/min
TCO2: 20.5 mmol/L (ref 0–100)
VT: 400 mL
pCO2 arterial: 37.2 mmHg (ref 35.0–45.0)
pH, Arterial: 7.342 — ABNORMAL LOW (ref 7.350–7.450)
pO2, Arterial: 193 mmHg — ABNORMAL HIGH (ref 80.0–100.0)

## 2014-05-24 LAB — I-STAT ARTERIAL BLOOD GAS, ED
ACID-BASE DEFICIT: 8 mmol/L — AB (ref 0.0–2.0)
BICARBONATE: 19.1 meq/L — AB (ref 20.0–24.0)
O2 SAT: 97 %
PH ART: 7.231 — AB (ref 7.350–7.450)
TCO2: 21 mmol/L (ref 0–100)
pCO2 arterial: 45.4 mmHg — ABNORMAL HIGH (ref 35.0–45.0)
pO2, Arterial: 107 mmHg — ABNORMAL HIGH (ref 80.0–100.0)

## 2014-05-24 LAB — PROCALCITONIN: Procalcitonin: 7.06 ng/mL

## 2014-05-24 LAB — PROTIME-INR
INR: 0.99 (ref 0.00–1.49)
Prothrombin Time: 13.1 seconds (ref 11.6–15.2)

## 2014-05-24 LAB — HEPARIN LEVEL (UNFRACTIONATED): Heparin Unfractionated: 0.78 IU/mL — ABNORMAL HIGH (ref 0.30–0.70)

## 2014-05-24 LAB — PHOSPHORUS: Phosphorus: 2.4 mg/dL (ref 2.3–4.6)

## 2014-05-24 LAB — MRSA PCR SCREENING: MRSA by PCR: NEGATIVE

## 2014-05-24 LAB — MAGNESIUM: Magnesium: 1.6 mg/dL (ref 1.5–2.5)

## 2014-05-24 LAB — PRO B NATRIURETIC PEPTIDE: PRO B NATRI PEPTIDE: 1419 pg/mL — AB (ref 0–125)

## 2014-05-24 MED ORDER — DEXTROSE 10 % IV SOLN: INTRAVENOUS | Status: DC | PRN

## 2014-05-24 MED ORDER — INSULIN GLARGINE 100 UNIT/ML ~~LOC~~ SOLN
10.0000 [IU] | Freq: Every day | SUBCUTANEOUS | Status: DC
  Administered 2014-05-24 – 2014-05-31 (×8): 10 [IU] via SUBCUTANEOUS
  Filled 2014-05-24 (×9): qty 0.1

## 2014-05-24 MED ORDER — ACETAMINOPHEN 160 MG/5ML PO SOLN
650.0000 mg | Freq: Once | ORAL | Status: AC
  Administered 2014-05-24: 650 mg
  Filled 2014-05-24: qty 20.3

## 2014-05-24 MED ORDER — BIOTENE DRY MOUTH MT LIQD
15.0000 mL | Freq: Four times a day (QID) | OROMUCOSAL | Status: DC
  Administered 2014-05-24 – 2014-06-01 (×30): 15 mL via OROMUCOSAL

## 2014-05-24 MED ORDER — SODIUM CHLORIDE 0.9 % IV SOLN
25.0000 ug/h | INTRAVENOUS | Status: DC
  Administered 2014-05-24: 100 ug/h via INTRAVENOUS
  Filled 2014-05-24 (×2): qty 50

## 2014-05-24 MED ORDER — NOREPINEPHRINE BITARTRATE 1 MG/ML IV SOLN
0.5000 ug/min | INTRAVENOUS | Status: DC
  Filled 2014-05-24 (×2): qty 4

## 2014-05-24 MED ORDER — FENTANYL CITRATE 0.05 MG/ML IJ SOLN: 100.0000 ug | INTRAMUSCULAR | Status: DC | PRN

## 2014-05-24 MED ORDER — POTASSIUM CHLORIDE ER 10 MEQ PO TBCR
20.0000 meq | EXTENDED_RELEASE_TABLET | Freq: Every day | ORAL | Status: DC
  Administered 2014-05-24: 20 meq via ORAL
  Filled 2014-05-24 (×2): qty 2

## 2014-05-24 MED ORDER — PANTOPRAZOLE SODIUM 40 MG IV SOLR
40.0000 mg | INTRAVENOUS | Status: DC
  Administered 2014-05-24 – 2014-05-29 (×6): 40 mg via INTRAVENOUS
  Filled 2014-05-24 (×9): qty 40

## 2014-05-24 MED ORDER — METOPROLOL TARTRATE 25 MG PO TABS
25.0000 mg | ORAL_TABLET | Freq: Two times a day (BID) | ORAL | Status: DC
Start: 2014-05-24 — End: 2014-05-24
  Administered 2014-05-24: 25 mg via ORAL
  Filled 2014-05-24: qty 1

## 2014-05-24 MED ORDER — ARTIFICIAL TEARS OP OINT: 1.0000 "application " | TOPICAL_OINTMENT | Freq: Three times a day (TID) | OPHTHALMIC | Status: DC

## 2014-05-24 MED ORDER — ASPIRIN 81 MG PO CHEW
81.0000 mg | CHEWABLE_TABLET | Freq: Every day | ORAL | Status: DC
  Administered 2014-05-24 – 2014-06-01 (×9): 81 mg via ORAL
  Filled 2014-05-24 (×9): qty 1

## 2014-05-24 MED ORDER — CISATRACURIUM BOLUS VIA INFUSION
0.0500 mg/kg | INTRAVENOUS | Status: DC | PRN
  Filled 2014-05-24: qty 5

## 2014-05-24 MED ORDER — INSULIN ASPART 100 UNIT/ML ~~LOC~~ SOLN
0.0000 [IU] | SUBCUTANEOUS | Status: DC
Start: 2014-05-24 — End: 2014-05-29
  Administered 2014-05-24: 2 [IU] via SUBCUTANEOUS
  Administered 2014-05-25: 5 [IU] via SUBCUTANEOUS
  Administered 2014-05-25: 2 [IU] via SUBCUTANEOUS
  Administered 2014-05-25: 5 [IU] via SUBCUTANEOUS
  Administered 2014-05-25: 2 [IU] via SUBCUTANEOUS
  Administered 2014-05-26 (×2): 5 [IU] via SUBCUTANEOUS
  Administered 2014-05-26: 3 [IU] via SUBCUTANEOUS
  Administered 2014-05-26: 5 [IU] via SUBCUTANEOUS
  Administered 2014-05-26: 3 [IU] via SUBCUTANEOUS
  Administered 2014-05-26: 8 [IU] via SUBCUTANEOUS
  Administered 2014-05-27: 3 [IU] via SUBCUTANEOUS
  Administered 2014-05-27: 8 [IU] via SUBCUTANEOUS
  Administered 2014-05-27: 3 [IU] via SUBCUTANEOUS
  Administered 2014-05-27: 5 [IU] via SUBCUTANEOUS
  Administered 2014-05-28: 2 [IU] via SUBCUTANEOUS
  Administered 2014-05-28 – 2014-05-29 (×6): 3 [IU] via SUBCUTANEOUS

## 2014-05-24 MED ORDER — SODIUM CHLORIDE 0.9 % IV SOLN: INTRAVENOUS | Status: DC

## 2014-05-24 MED ORDER — FENTANYL CITRATE 0.05 MG/ML IJ SOLN
50.0000 ug | INTRAMUSCULAR | Status: DC | PRN
  Administered 2014-05-25 (×4): 50 ug via INTRAVENOUS
  Administered 2014-05-25: 100 ug via INTRAVENOUS
  Administered 2014-05-26: 150 ug via INTRAVENOUS
  Administered 2014-05-26: 50 ug via INTRAVENOUS
  Administered 2014-05-26 (×2): 150 ug via INTRAVENOUS
  Administered 2014-05-26: 100 ug via INTRAVENOUS
  Administered 2014-05-27 (×2): 200 ug via INTRAVENOUS
  Administered 2014-05-28: 50 ug via INTRAVENOUS
  Administered 2014-05-28 – 2014-05-29 (×2): 100 ug via INTRAVENOUS
  Filled 2014-05-24: qty 4
  Filled 2014-05-24 (×2): qty 2
  Filled 2014-05-24 (×3): qty 4
  Filled 2014-05-24 (×5): qty 2
  Filled 2014-05-24: qty 4
  Filled 2014-05-24: qty 2

## 2014-05-24 MED ORDER — SODIUM CHLORIDE 0.9 % IV SOLN: 2000.0000 mL | Freq: Once | INTRAVENOUS | Status: DC

## 2014-05-24 MED ORDER — SODIUM CHLORIDE 0.9 % IV SOLN
INTRAVENOUS | Status: DC
  Administered 2014-05-24: 3.5 [IU]/h via INTRAVENOUS
  Filled 2014-05-24: qty 1

## 2014-05-24 MED ORDER — INSULIN ASPART 100 UNIT/ML ~~LOC~~ SOLN: 2.0000 [IU] | SUBCUTANEOUS | Status: DC

## 2014-05-24 MED ORDER — HEPARIN SODIUM (PORCINE) 5000 UNIT/ML IJ SOLN
5000.0000 [IU] | Freq: Three times a day (TID) | INTRAMUSCULAR | Status: DC
  Administered 2014-05-24: 5000 [IU] via SUBCUTANEOUS
  Filled 2014-05-24 (×4): qty 1

## 2014-05-24 MED ORDER — HEPARIN BOLUS VIA INFUSION
4000.0000 [IU] | Freq: Once | INTRAVENOUS | Status: AC
  Administered 2014-05-24: 4000 [IU] via INTRAVENOUS
  Filled 2014-05-24: qty 4000

## 2014-05-24 MED ORDER — HEPARIN (PORCINE) IN NACL 100-0.45 UNIT/ML-% IJ SOLN
850.0000 [IU]/h | INTRAMUSCULAR | Status: DC
  Administered 2014-05-24: 1000 [IU]/h via INTRAVENOUS
  Administered 2014-05-25: 750 [IU]/h via INTRAVENOUS
  Filled 2014-05-24 (×4): qty 250

## 2014-05-24 MED ORDER — SODIUM CHLORIDE 0.9 % IV SOLN
1.0000 ug/kg/min | INTRAVENOUS | Status: DC
  Filled 2014-05-24: qty 20

## 2014-05-24 MED ORDER — ALBUTEROL SULFATE (2.5 MG/3ML) 0.083% IN NEBU: 2.5000 mg | INHALATION_SOLUTION | RESPIRATORY_TRACT | Status: DC | PRN

## 2014-05-24 MED ORDER — INSULIN GLARGINE 100 UNIT/ML ~~LOC~~ SOLN
10.0000 [IU] | SUBCUTANEOUS | Status: DC
Start: 2014-05-24 — End: 2014-05-24
  Administered 2014-05-24: 10 [IU] via SUBCUTANEOUS
  Filled 2014-05-24 (×2): qty 0.1

## 2014-05-24 MED ORDER — FENTANYL CITRATE 0.05 MG/ML IJ SOLN: 100.0000 ug | Freq: Once | INTRAMUSCULAR | Status: DC

## 2014-05-24 MED ORDER — PIPERACILLIN-TAZOBACTAM 3.375 G IVPB
3.3750 g | Freq: Three times a day (TID) | INTRAVENOUS | Status: DC
  Administered 2014-05-24: 3.375 g via INTRAVENOUS
  Filled 2014-05-24 (×3): qty 50

## 2014-05-24 MED ORDER — INSULIN ASPART 100 UNIT/ML ~~LOC~~ SOLN
2.0000 [IU] | SUBCUTANEOUS | Status: DC
Start: 2014-05-24 — End: 2014-05-24

## 2014-05-24 MED ORDER — CISATRACURIUM BOLUS VIA INFUSION
0.1000 mg/kg | Freq: Once | INTRAVENOUS | Status: DC
  Filled 2014-05-24: qty 9

## 2014-05-24 MED ORDER — CHLORHEXIDINE GLUCONATE 0.12 % MT SOLN
OROMUCOSAL | Status: AC
  Filled 2014-05-24: qty 15

## 2014-05-24 MED ORDER — FENTANYL BOLUS VIA INFUSION
50.0000 ug | INTRAVENOUS | Status: DC | PRN
  Filled 2014-05-24: qty 50

## 2014-05-24 MED ORDER — CHLORHEXIDINE GLUCONATE 0.12 % MT SOLN
15.0000 mL | Freq: Two times a day (BID) | OROMUCOSAL | Status: DC
  Administered 2014-05-24 – 2014-05-28 (×10): 15 mL via OROMUCOSAL
  Filled 2014-05-24 (×10): qty 15

## 2014-05-24 MED ORDER — SODIUM CHLORIDE 0.9 % IV SOLN
3.0000 g | Freq: Four times a day (QID) | INTRAVENOUS | Status: DC
  Administered 2014-05-24 – 2014-05-25 (×4): 3 g via INTRAVENOUS
  Filled 2014-05-24 (×7): qty 3

## 2014-05-24 MED ORDER — METOPROLOL TARTRATE 50 MG PO TABS
50.0000 mg | ORAL_TABLET | Freq: Two times a day (BID) | ORAL | Status: DC
  Administered 2014-05-24 – 2014-05-28 (×10): 50 mg via ORAL
  Filled 2014-05-24: qty 1
  Filled 2014-05-24: qty 2
  Filled 2014-05-24 (×11): qty 1

## 2014-05-24 MED ORDER — PROPOFOL 10 MG/ML IV EMUL
5.0000 ug/kg/min | INTRAVENOUS | Status: DC
  Administered 2014-05-24 (×3): 30 ug/kg/min via INTRAVENOUS
  Administered 2014-05-24: 40 ug/kg/min via INTRAVENOUS
  Administered 2014-05-25: 20 ug/kg/min via INTRAVENOUS
  Administered 2014-05-25: 40 ug/kg/min via INTRAVENOUS
  Filled 2014-05-24 (×9): qty 100

## 2014-05-24 MED ORDER — ASPIRIN 300 MG RE SUPP
300.0000 mg | RECTAL | Status: AC
  Filled 2014-05-24: qty 1

## 2014-05-24 MED ORDER — FUROSEMIDE 10 MG/ML IJ SOLN
40.0000 mg | Freq: Every day | INTRAMUSCULAR | Status: DC
  Administered 2014-05-24: 40 mg via INTRAVENOUS
  Filled 2014-05-24 (×2): qty 4

## 2014-05-24 MED FILL — Medication: Qty: 1 | Status: AC

## 2014-05-24 NOTE — Progress Notes (Addendum)
Pt transitioned to to ICU hyperglycemia phase 3.  Pt had 6 CBGs less than 180 and insulin rate less than 4 for 6 hours.Jule Economy followed, 10u lantus ordered, and standard scale for q4 coverage ordered.  Will keep insulin gtt on at present rate for 2 more hours after lantus is given.  Will continue to monitor closely and update as needed. Witnessed by 2 RNs.

## 2014-05-24 NOTE — Procedures (Signed)
Chest Tube Insertion Procedure Note  Indications:  Clinically significant left pneumothorax  Pre-operative Diagnosis: Left pneumothorax  Post-operative Diagnosis: Left pneumothorax  Procedure Details  Procedure was done as an emergency. No informed consent was obtained.  After sterile skin prep, using Seldinger technique, a 14 French pigtail catheter was placed at the level of the anterior axillary line and 5th intercostal space.  Findings: None  Estimated Blood Loss:  Minimal         Specimens:  None              Complications:  None; patient tolerated the procedure well.         Disposition: ICU - intubated and critically ill.         Condition: stable  Overton Mam, M.D. Pulmonary and Critical Care Medicine Call E-link with questions [phone]

## 2014-05-24 NOTE — Progress Notes (Signed)
  Echocardiogram 2D Echocardiogram has been performed.  Beautiful Amanda Stewart 05/24/2014, 8:55 AM

## 2014-05-24 NOTE — Progress Notes (Signed)
ANTICOAGULATION CONSULT NOTE - Follow Up Consult  Pharmacy Consult for Heparin Indication: chest pain/ACS  Allergies  Allergen Reactions  . 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    Patient Measurements: Height: 5\' 4"  (162.6 cm) Weight: 180 lb (81.647 kg) IBW/kg (Calculated) : 54.7 Heparin Dosing Weight: 72.3kg  Vital Signs: Temp: 101.1 F (38.4 C) (06/28 1945) Temp src: Core (Comment) (06/28 1600) BP: 167/92 mmHg (06/28 1900) Pulse Rate: 92 (06/28 1945)  Labs:  Recent Labs  05/23/14 2310 05/23/14 2319 05/24/14 0117 05/24/14 0118 05/24/14 0235 05/24/14 0632 05/24/14 0745 05/24/14 1253 05/24/14 1837  HGB 11.7* 13.3  --   --   --   --   --   --   --   HCT 38.2 39.0  --   --   --   --   --   --   --   PLT 227  --   --   --   --   --   --   --   --   LABPROT  --   --   --   --   --   --  13.1  --   --   INR  --   --   --   --   --   --  0.99  --   --   HEPARINUNFRC  --   --   --   --   --   --   --   --  0.78*  CREATININE 1.09 1.20* 1.09  --  0.85  --   --   --   --   TROPONINI  --   --   --  <0.30  --  0.40*  --  0.42*  --     Estimated Creatinine Clearance: 60 ml/min (by C-G formula based on Cr of 0.85).   Medications:  Heparin 1000 units/hr  Assessment: 72yof on heparin for positive troponins, r/o ACS. Heparin level (0.78) is supratherapeutic - will decrease rate and check 8 hour heparin level. - CBC wnl - No significant bleeding reported per RN  Goal of Therapy:  Heparin level 0.3-0.7 units/ml Monitor platelets by anticoagulation protocol: Yes   Plan:  1. Decrease heparin drip to 900 units/hr (9 ml/hr) 2. Check heparin level 8 hours after rate decrease  Cleon Dew [phone] 05/24/2014,8:03 PM

## 2014-05-24 NOTE — Progress Notes (Signed)
eLink Physician-Brief Progress Note Patient Name: Chrystie Dimos DOB: 1941-03-14 MRN: [id number]  Date of Service  05/24/2014   HPI/Events of Note   SUP  eICU Interventions  Order PRotonix.   Intervention Category Major Interventions: Other:  SOOD,VINEET 05/24/2014, 3:58 AM

## 2014-05-24 NOTE — Progress Notes (Signed)
MD paged about pt's increasing temperature. Procalcitonin, lactic acid, respiratory culture, urine culture, and 650mg  tylenol per tube ordered per Dr. Marchelle Gearing. Will continue to monitor closely and update as needed.

## 2014-05-24 NOTE — Progress Notes (Addendum)
ANTIBIOTIC CONSULT NOTE - INITIAL  Pharmacy Consult for Zosyn>>unasyn Indication: Aspiration PNA  Allergies  Allergen Reactions  . 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    Patient Measurements: Height: 5\' 4"  (162.6 cm) Weight: 180 lb (81.647 kg) IBW/kg (Calculated) : 54.7  Vital Signs: Temp: 99.7 F (37.6 C) (06/28 0800) Temp src: Core (Comment) (06/28 0800) BP: 142/84 mmHg (06/28 0800) Pulse Rate: 110 (06/28 0800)  Labs:  Recent Labs  05/23/14 2310 05/23/14 2319 05/24/14 0117 05/24/14 0235  WBC 14.6*  --   --   --   HGB 11.7* 13.3  --   --   PLT 227  --   --   --   CREATININE 1.09 1.20* 1.09 0.85   Estimated Creatinine Clearance: 60 ml/min (by C-G formula based on Cr of 0.85).  Medical History: Past Medical History  Diagnosis Date  . Diabetes mellitus without complication   . Hypertension   . Chest pain   . Arrhythmia   . Left bundle branch block   . CAD (coronary artery disease)   . Hyperlipidemia   . Combined systolic and diastolic heart failure   . GERD (gastroesophageal reflux disease)   . Anemia    Assessment: 73 y/o F to start Zosyn for aspiration PNA early this morning. WBC 14.6, Scr now 0.8, pt is s/p cardiac arrest. New orders received to narrow abx slightly to unasyn this morning. No dose adjustments warranted at this time.  Plan:  -D/c Zosyn  -Unasyn 3g q6 hours -Trend WBC, temp, renal function   Sheppard Coil PharmD., BCPS Clinical Pharmacist Pager [phone] 05/24/2014 8:42 AM  Addendum:  Mild troponin bump (0.4) s/p cardiac arrest. No STeMI noted. Orders to start IV heparin for r/o acs. Echo pending. Hypothermia not initiated.  Plan: Heparin bolus of 4000 units x1 Heparin gtt at 1000 units/hr Check 8 hour HL then daily  05/24/2014 8:47 AM

## 2014-05-24 NOTE — ED Notes (Signed)
Pacing discontinued due to patient perfusing on own, HR 100

## 2014-05-24 NOTE — Progress Notes (Signed)
PULMONARY / CRITICAL CARE MEDICINE   Name: Amanda Stewart MRN: [id number] DOB: [birth year]    ADMISSION DATE:  05/23/2014  PRIMARY SERVICE: PCCM  CHIEF COMPLAINT:  Witnessed cardiac arrest at home  BRIEF PATIENT DESCRIPTION:  73 years old with PMH relevant for CAD, CHF with LVEF of 40%, HTN, DM. Sustained witnessed cardiac arrest requiring 15 minutes of CPR and 4 rounds of epi and 2 shocks before returning to spontaneous circulation. Initial rhythm was V. Fib. Got a King airway and was intubated at arrival to the ED. Patient remained unresponsive. As per her daughter, she is non compliant with her medications. She was doing well 05/23/14  morning and at about 9:30 pm called daughter complaining of severe SOB asking for an ambulance. At arrival of EMS she was in the floor but still with pulse but then became unresponsive and went into V. Fib. Down time of about 15 minutes. At the time of PCCM examination 05/23/14 in ER (note done 05/24/14)  the patient is intubated, opens her eyes on voice command and moves all 4 extremities. There are reports of significant vomit and aspiration of vomit in ER EKG showed normal sinus rhythm with left anterior fascicular block Q-wave in V1 to V3 and ST-T wave changes in lateral leads. No STEMI. Cardiology involved since admission to ED. On arrival to ICU: she was noticed to be following commands open eyes to verbal stimulii and so induced hypothermia not initiated.     SIGNIFICANT EVENTS / STUDIES:  - 05/23/2014:   LINES / TUBES: - Peripheral IV's - ETT - Foley catheter - left subclavian 6/28  - left chest tube (ptx following line) 6/28 >> - right radial aline 6/28 >>  CULTURES: - Blood cultures sent  ANTIBIOTICS: Anti-infectives   Start     Dose/Rate Route Frequency Ordered Stop   05/24/14 0300  piperacillin-tazobactam (ZOSYN) IVPB 3.375 g     3.375 g 12.5 mL/hr over 240 Minutes Intravenous 3 times per day 05/24/14 0245        Unasyn 05/24/14  >>   SUBJECTIVE:   05/24/14: On diprivan: when weaned agitation + and grimace to mouth care. Not on pressors. Not been to cath lab. On 70% fi02. ECHO in progress  VITAL SIGNS: Temp:  [97.3 F (36.3 C)-100.4 F (38 C)] 99.7 F (37.6 C) (06/28 0800) Pulse Rate:  [39-123] 110 (06/28 0800) Resp:  [0-35] 32 (06/28 0800) BP: (92-183)/(32-165) 142/84 mmHg (06/28 0800) SpO2:  [91 %-100 %] 100 % (06/28 0800) Arterial Line BP: (97-169)/(55-99) 141/73 mmHg (06/28 0800) FiO2 (%):  [70 %-100 %] 70 % (06/28 0800) Weight:  [81.647 kg (180 lb)] 81.647 kg (180 lb) (06/27 2320) HEMODYNAMICS: CVP:  [6 mmHg-15 mmHg] 7 mmHg VENTILATOR SETTINGS: Vent Mode:  [-] PRVC FiO2 (%):  [70 %-100 %] 70 % Set Rate:  [18 bmp-35 bmp] 32 bmp Vt Set:  [330 mL-500 mL] 400 mL PEEP:  [5 cmH20] 5 cmH20 Plateau Pressure:  [20 cmH20-31 cmH20] 20 cmH20 INTAKE / OUTPUT: Intake/Output     06/27 0701 - 06/28 0700 06/28 0701 - 06/29 0700   I.V. (mL/kg) 88.1 (1.1) 30 (0.4)   Total Intake(mL/kg) 88.1 (1.1) 30 (0.4)   Urine (mL/kg/hr)  40 (0.4)   Total Output   40   Net +88.1 -10          PHYSICAL EXAMINATION: General: Intubated, looks critically ill. Eyes: Anicteric sclerae. Pupils are equal but sluggish.  ENT: ETT in place. Trachea at midline.  Lymph:  No cervical, supraclavicular, or axillary lymphadenopathy. Heart: Normal S1, S2. No murmurs, rubs, or gallops appreciated. No bruits, equal pulses. Lungs: Normal excursion, no dullness to percussion. Good air movement bilaterally, without wheezes or crackles.  Abdomen: Abdomen soft, non-tender and not distended, normoactive bowel sounds. No hepatosplenomegaly or masses. Musculoskeletal: No clubbing or synovitis. No LE edema Skin: No rashes or lesions Neuro: RASS -3 on diprivan  LABS:  PULMONARY  Recent Labs Lab 05/23/14 2302 05/23/14 2319 05/24/14 0018 05/24/14 0426 05/24/14 0606  PHART 6.902*  --  7.231* 7.222* 7.342*  PCO2ART 78.3*  --  45.4* 53.7* 37.2   PO2ART 144.0*  --  107.0* 92.0 193.0*  HCO3 15.4*  --  19.1* 21.8 19.4*  TCO2 18 18 21 23  20.5  O2SAT 96.0  --  97.0 94.0 97.7    CBC  Recent Labs Lab 05/23/14 2310 05/23/14 2319  HGB 11.7* 13.3  HCT 38.2 39.0  WBC 14.6*  --   PLT 227  --     COAGULATION  Recent Labs Lab 05/24/14 0745  INR 0.99    CARDIAC   Recent Labs Lab 05/24/14 0118 05/24/14 0632  TROPONINI <0.30 0.40*   No results found for this basename: PROBNP,  in the last 168 hours   CHEMISTRY  Recent Labs Lab 05/23/14 2310 05/23/14 2319 05/24/14 0117 05/24/14 0235  NA 141 139 139 138  K 3.9 3.6* 5.8* 4.6  CL 99 106 100 100  CO2 15*  --  17* 22  GLUCOSE 417* 408* 383* 122*  BUN 13 14 15 21   CREATININE 1.09 1.20* 1.09 0.85  CALCIUM 8.5  --  8.6 9.0   Estimated Creatinine Clearance: 60 ml/min (by C-G formula based on Cr of 0.85).   LIVER  Recent Labs Lab 05/23/14 2310 05/24/14 0745  AST 108*  --   ALT 70*  --   ALKPHOS 144*  --   BILITOT <0.2*  --   PROT 6.4  --   ALBUMIN 3.0*  --   INR  --  0.99     INFECTIOUS  Recent Labs Lab 05/23/14 2331  LATICACIDVEN 11.8*     ENDOCRINE CBG (last 3)   Recent Labs  05/24/14 0311 05/24/14 0503 05/24/14 0609  GLUCAP 407* 395* 357*         IMAGING x48h  Dg Chest Port 1 View  05/24/2014   CLINICAL DATA:  Chest tube placement  EXAM: PORTABLE CHEST - 1 VIEW  COMPARISON:  Chest x-ray from earlier the same day at 2:24 a.m.  FINDINGS: Endotracheal tube remains at the level of the mid thoracic trachea. Left subclavian central line and orogastric tube remain in good position. There is a new small bore left-sided chest tube, with retention curve overlapping the left hilum. Left-sided pneumothorax is no longer visible. The left diaphragm is not well seen, likely atelectasis given the timing. New subcutaneous emphysema in the left chest wall. Perihilar airspace opacity persists.  IMPRESSION: 1. New left chest tube.  No residual  pneumothorax. 2. New retrocardiac atelectasis. 3. Perihilar pulmonary edema.   Electronically Signed   By: Tiburcio PeaJonathan  Watts M.D.   On: 05/24/2014 05:38   Dg Chest Port 1 View  05/24/2014   CLINICAL DATA:  Central line placement.  Concern for pneumothorax.  EXAM: PORTABLE CHEST - 1 VIEW  COMPARISON:  05/23/2014.  FINDINGS: New left subclavian central line, tip at the level of the upper SVC. Unchanged positioning of endotracheal and orogastric tubes, both in good position.  There is an unexpected interface at the left apex and lateral upper chest compatible with pneumothorax. Estimation of pneumothorax volume is limited in the supine position. Ongoing perihilar airspace disease, pattern favoring edema. Mild cardiomegaly is stable from prior.  Critical Value/emergent results were called by telephone at the time of interpretation on 05/24/2014 at 3:32 AM to RN May, who verbally acknowledged these results and will relay to critical care fellow.  IMPRESSION: 1. Small left pneumothorax. 2. New left subclavian central line is in good position. 3. Persistent perihilar edema.   Electronically Signed   By: Tiburcio Pea M.D.   On: 05/24/2014 03:33   Dg Chest Portable 1 View  05/24/2014   CLINICAL DATA:  Cardiac arrest.  EXAM: PORTABLE CHEST - 1 VIEW  COMPARISON:  None.  FINDINGS: Endotracheal tube is in grossly good position with tip approximately 4 cm above the carina. Nasogastric tube is seen entering the stomach. Defibrillator pads are seen. No pneumothorax or significant pleural effusion is noted. Bilateral perihilar opacities are noted most consistent with edema or less likely pneumonia. Bony thorax appears intact.  IMPRESSION: Endotracheal tube in grossly good position. Bilateral perihilar opacities are noted most consistent with edema or less likely pneumonia.   Electronically Signed   By: Roque Lias M.D.   On: 05/24/2014 00:27       ASSESSMENT / PLAN:  PULMONARY A: 1) Acute respiratory failure  secondary to cardiac arrest. Likely primary cardiac event.  2) Aspiration event   - on 70% fio2 but PO2 is 193   P:   - Mechanical ventilation   - PRVC, Vt: 6cc/kg, PEEP: 5, RR: 30, FiO2: 100% and adjust to keep O2 sat > 92%   - VAP prevention order set   - Daily awakening and SBT when meets criteria - Albuterol PRN - Zosyn  CARDIOVASCULAR A:  1) Witnessed cardiac arrest. No evidence of STEMI on EKG. Hx of NICM.   - mild trop bump. Per cards: no need for cath now P:  - - Echocardiogram in am ongoing; await result - start IV heparin gtt   RENAL A:   1) Acute renal failure likely secondary to shock / arrest  - improved P:   - - Will follow BMP ; check mag and phos - Electrolyte replenishment  as indicated.  GASTROINTESTINAL A:   1) GERD P:   - IV protonix - start tube feeds 05/25/14  HEMATOLOGIC A:   1) No issues P:  - Will follow CBC  INFECTIOUS A:   1) Aspirated at admit in ER 05/23/2014 P:   Unasyn to cover for aspiration pneumonia  ENDOCRINE A:   1) DM P:   - Novolog sliding scale per ICU protocol  NEUROLOGIC A:   1) Post cardiac arrest. At admission: Opening eyes on voice command and moving all 4 extremities after 10 minutes off propofol.    P:   - Sedation with propofol and fentanyl prn   TODAY'S SUMMARY:  Try to wean off fio2. Start IV heparin but cards to decide if to continue or dc (paged them, awaiting response). Supportive care. Check mag and phos. No family at bedside     The patient is critically ill with multiple organ systems failure and requires high complexity decision making for assessment and support, frequent evaluation and titration of therapies, application of advanced monitoring technologies and extensive interpretation of multiple databases.   Critical Care Time devoted to patient care services described in this note is  31  Minutes.  Dr. Kalman Shan, M.D., [facility].C.P Pulmonary and Critical Care Medicine Staff  Physician [facility] Pager: [phone], If no answer or between  15:00h - 7:00h: call [phone]  05/24/2014 8:39 AM

## 2014-05-24 NOTE — ED Notes (Signed)
MD at bedside. 

## 2014-05-24 NOTE — Procedures (Signed)
Central Venous Catheter Insertion Procedure Note Rether Isaiah [id number] [birth year]  Procedure: Insertion of Central Venous Catheter Indications: Assessment of intravascular volume, Drug and/or fluid administration and Frequent blood sampling  Procedure Details Consent: Risks of procedure as well as the alternatives and risks of each were explained to the (patient/caregiver).  Consent for procedure obtained. Time Out: Verified patient identification, verified procedure, site/side was marked, verified correct patient position, special equipment/implants available, medications/allergies/relevent history reviewed, required imaging and test results available.  Performed  Maximum sterile technique was used including antiseptics, cap, gloves, gown, hand hygiene, mask and sheet. Skin prep: Chlorhexidine; local anesthetic administered A antimicrobial bonded/coated triple lumen catheter was placed in the left subclavian vein using the Seldinger technique.  Evaluation Blood flow good Complications: Small left pneumothorax Patient did tolerate procedure well. Chest X-ray ordered to verify placement.  CXR: Small left pneumothorax.  Overton Mam 05/24/2014, 4:53 AM

## 2014-05-24 NOTE — Procedures (Signed)
Arterial Catheter Insertion Procedure Note Amanda Stewart [id number] 1941-07-26  Procedure: Insertion of Arterial Catheter  Indications: Blood pressure monitoring and Frequent blood sampling  Procedure Details Consent: Risks of procedure as well as the alternatives and risks of each were explained to the (patient/caregiver).  Consent for procedure obtained. Time Out: Verified patient identification, verified procedure, site/side was marked, verified correct patient position, special equipment/implants available, medications/allergies/relevent history reviewed, required imaging and test results available.  Performed  Maximum sterile technique was used including antiseptics, cap, gloves, gown, hand hygiene, mask and sheet. Skin prep: Chlorhexidine; local anesthetic administered 20 gauge catheter was inserted into right radial artery using the Seldinger technique.  Evaluation Blood flow good; BP tracing good. Complications: No apparent complications   Assisted by [facility]    Tacy Learn 05/24/2014

## 2014-05-24 NOTE — Progress Notes (Signed)
Utilization Review Completed.Dowell, Deborah T6/28/2015  

## 2014-05-24 NOTE — Progress Notes (Signed)
ANTIBIOTIC CONSULT NOTE - INITIAL  Pharmacy Consult for Zosyn Indication: Aspiration PNA  Allergies  Allergen Reactions  . 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    Patient Measurements: Height: 5\' 4"  (162.6 cm) Weight: 180 lb (81.647 kg) IBW/kg (Calculated) : 54.7  Vital Signs: Temp: 99.7 F (37.6 C) (06/28 0215) Temp src: Core (Comment) (06/28 0215) BP: 143/88 mmHg (06/28 0215) Pulse Rate: 107 (06/28 0215)  Labs:  Recent Labs  05/23/14 2310 05/23/14 2319 05/24/14 0117  WBC 14.6*  --   --   HGB 11.7* 13.3  --   PLT 227  --   --   CREATININE 1.09 1.20* 1.09   Estimated Creatinine Clearance: 46.8 ml/min (by C-G formula based on Cr of 1.09).  Medical History: Past Medical History  Diagnosis Date  . Diabetes mellitus without complication   . Hypertension   . Chest pain   . Arrhythmia   . Left bundle branch block   . CAD (coronary artery disease)   . Hyperlipidemia   . Combined systolic and diastolic heart failure   . GERD (gastroesophageal reflux disease)   . Anemia    Assessment: 73 y/o F to start Zosyn for aspiration PNA. WBC 14.6, Scr 1.09, pt is s/p cardiac arrest, so renal function could decline.   Plan:  -Zosyn 3.375G IV q8h to be infused over 4 hours -Trend WBC, temp, renal function   Amanda Stewart 05/24/2014,2:42 AM

## 2014-05-24 NOTE — Consult Note (Signed)
Amanda Stewart is an 73 y.o. female.  HER:DEYCX Complaint: Witnessed out of hospital questionable V. fib cardiac arrest HPI: Patient is 73 year old female with past medical history significant for mild coronary artery disease with mildly depressed LV systolic function, history of congestive heart failure secondary to depressed LV systolic function, hypertension, diabetes mellitus, came to the ER by EMS as patient had witnessed cardiac arrest at home. Patient complained of sudden onset of shortness of breath around 9:35 PM called her daughter to call the EMS when EMS arrived patient was in respiratory distress and suddenly collapsed had CPR for approximately 15 minutes in total 4 rounds of epi. noted to have  V. fib requiring 2 shocks and intubation in the field. Patient also and brief episode of complete heart block requiring transcutaneous pacer. EKG showed normal sinus rhythm with left anterior fascicular block Q-wave in V1 to V3 and ST-T wave changes in lateral leads. There were no acute ischemic changes suggestive of ST elevation MI. Patient denied any chest pain prior to cardiac arrest except for complaining of shortness of breath. As per family patient had similar episodes approximately 8-9 years ago and had cardiac catheterization and was noted to have mild coronary artery disease.  Past Medical History  Diagnosis Date  . Diabetes mellitus without complication   . Hypertension   . Chest pain   . Arrhythmia   . Left bundle branch block   . CAD (coronary artery disease)   . Hyperlipidemia   . Combined systolic and diastolic heart failure   . GERD (gastroesophageal reflux disease)   . Anemia     No past surgical history on file.  No family history on file.  Social History:  has no tobacco, alcohol, and drug history on file.  Allergies:  Allergies  Allergen Reactions  . 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    Medications: I have reviewed the patient's current medications.  Results  for orders placed during the hospital encounter of 05/23/14 (from the past 48 hour(s))  I-STAT ARTERIAL BLOOD GAS, ED     Status: Abnormal   Collection Time    05/23/14 11:02 PM      Result Value Ref Range   pH, Arterial 6.902 (*) 7.350 - 7.450   pCO2 arterial 78.3 (*) 35.0 - 45.0 mmHg   pO2, Arterial 144.0 (*) 80.0 - 100.0 mmHg   Bicarbonate 15.4 (*) 20.0 - 24.0 mEq/L   TCO2 18  0 - 100 mmol/L   O2 Saturation 96.0     Acid-base deficit 18.0 (*) 0.0 - 2.0 mmol/L   Patient temperature 98.6 F     Collection site RADIAL, ALLEN'S TEST ACCEPTABLE     Drawn by RT     Sample type ARTERIAL     Comment NOTIFIED PHYSICIAN    CBC WITH DIFFERENTIAL     Status: Abnormal   Collection Time    05/23/14 11:10 PM      Result Value Ref Range   WBC 14.6 (*) 4.0 - 10.5 K/uL   RBC 3.77 (*) 3.87 - 5.11 MIL/uL   Hemoglobin 11.7 (*) 12.0 - 15.0 g/dL   HCT 38.2  36.0 - 46.0 %   MCV 101.3 (*) 78.0 - 100.0 fL   MCH 31.0  26.0 - 34.0 pg   MCHC 30.6  30.0 - 36.0 g/dL   RDW 13.2  11.5 - 15.5 %   Platelets 227  150 - 400 K/uL   Neutrophils Relative % 39 (*) 43 - 77 %  Lymphocytes Relative 57 (*) 12 - 46 %   Monocytes Relative 3  3 - 12 %   Eosinophils Relative 1  0 - 5 %   Basophils Relative 0  0 - 1 %   Neutro Abs 5.7  1.7 - 7.7 K/uL   Lymphs Abs 8.4 (*) 0.7 - 4.0 K/uL   Monocytes Absolute 0.4  0.1 - 1.0 K/uL   Eosinophils Absolute 0.1  0.0 - 0.7 K/uL   Basophils Absolute 0.0  0.0 - 0.1 K/uL   WBC Morphology ATYPICAL LYMPHOCYTES    COMPREHENSIVE METABOLIC PANEL     Status: Abnormal   Collection Time    05/23/14 11:10 PM      Result Value Ref Range   Sodium 141  137 - 147 mEq/L   Potassium 3.9  3.7 - 5.3 mEq/L   Chloride 99  96 - 112 mEq/L   CO2 15 (*) 19 - 32 mEq/L   Glucose, Bld 417 (*) 70 - 99 mg/dL   BUN 13  6 - 23 mg/dL   Creatinine, Ser 1.09  0.50 - 1.10 mg/dL   Calcium 8.5  8.4 - 10.5 mg/dL   Total Protein 6.4  6.0 - 8.3 g/dL   Albumin 3.0 (*) 3.5 - 5.2 g/dL   AST 108 (*) 0 - 37 U/L    ALT 70 (*) 0 - 35 U/L   Alkaline Phosphatase 144 (*) 39 - 117 U/L   Total Bilirubin <0.2 (*) 0.3 - 1.2 mg/dL   GFR calc non Af Amer 50 (*) >90 mL/min   GFR calc Af Amer 58 (*) >90 mL/min   Comment: (NOTE)     The eGFR has been calculated using the CKD EPI equation.     This calculation has not been validated in all clinical situations.     eGFR's persistently <90 mL/min signify possible Chronic Kidney     Disease.  Randolm Idol, ED     Status: None   Collection Time    05/23/14 11:15 PM      Result Value Ref Range   Troponin i, poc 0.06  0.00 - 0.08 ng/mL   Comment 3            Comment: Due to the release kinetics of cTnI,     a negative result within the first hours     of the onset of symptoms does not rule out     myocardial infarction with certainty.     If myocardial infarction is still suspected,     repeat the test at appropriate intervals.  I-STAT CHEM 8, ED     Status: Abnormal   Collection Time    05/23/14 11:19 PM      Result Value Ref Range   Sodium 139  137 - 147 mEq/L   Potassium 3.6 (*) 3.7 - 5.3 mEq/L   Chloride 106  96 - 112 mEq/L   BUN 14  6 - 23 mg/dL   Creatinine, Ser 1.20 (*) 0.50 - 1.10 mg/dL   Glucose, Bld 408 (*) 70 - 99 mg/dL   Calcium, Ion 1.09 (*) 1.13 - 1.30 mmol/L   TCO2 18  0 - 100 mmol/L   Hemoglobin 13.3  12.0 - 15.0 g/dL   HCT 39.0  36.0 - 46.0 %  LACTIC ACID, PLASMA     Status: Abnormal   Collection Time    05/23/14 11:31 PM      Result Value Ref Range   Lactic Acid, Venous 11.8 (*)  0.5 - 2.2 mmol/L  I-STAT ARTERIAL BLOOD GAS, ED     Status: Abnormal   Collection Time    05/24/14 12:18 AM      Result Value Ref Range   pH, Arterial 7.231 (*) 7.350 - 7.450   pCO2 arterial 45.4 (*) 35.0 - 45.0 mmHg   pO2, Arterial 107.0 (*) 80.0 - 100.0 mmHg   Bicarbonate 19.1 (*) 20.0 - 24.0 mEq/L   TCO2 21  0 - 100 mmol/L   O2 Saturation 97.0     Acid-base deficit 8.0 (*) 0.0 - 2.0 mmol/L   Patient temperature 97.8 F     Collection site RADIAL,  ALLEN'S TEST ACCEPTABLE     Drawn by Operator     Sample type ARTERIAL      Dg Chest Portable 1 View  05/24/2014   CLINICAL DATA:  Cardiac arrest.  EXAM: PORTABLE CHEST - 1 VIEW  COMPARISON:  None.  FINDINGS: Endotracheal tube is in grossly good position with tip approximately 4 cm above the carina. Nasogastric tube is seen entering the stomach. Defibrillator pads are seen. No pneumothorax or significant pleural effusion is noted. Bilateral perihilar opacities are noted most consistent with edema or less likely pneumonia. Bony thorax appears intact.  IMPRESSION: Endotracheal tube in grossly good position. Bilateral perihilar opacities are noted most consistent with edema or less likely pneumonia.   Electronically Signed   By: Sabino Dick M.D.   On: 05/24/2014 00:27    Review of Systems  Unable to perform ROS: intubated   Blood pressure 133/84, pulse 105, temperature 98.4 F (36.9 C), temperature source Rectal, resp. rate 35, height 5' 4"  (1.626 m), weight 81.647 kg (180 lb), SpO2 99.00%. Physical Exam  Eyes: Conjunctivae are normal. Left eye exhibits no discharge. No scleral icterus.  Neck: Normal range of motion. Neck supple. JVD present.  Cardiovascular:  Tachycardic S1 and S2 soft soft S3 gallop noted  Respiratory:  Bilateral coarse breath sounds  and rales noted  GI: Soft. Bowel sounds are normal.  Musculoskeletal: She exhibits no edema and no tenderness.  Neurological:  Patient intubated sedated on paralytics.    Assessment/Plan: Witnessed cardiac arrest rule out ischemia Acute pulmonary edema rule out ischemia rule out MI Acute respiratory failure secondary to above Metabolic acidosis Hypertension Diabetes mellitus History of nonischemic cardiomyopathy Plan Check serial enzymes and EKG Check 2-D echo Start aspirin heparin low-dose beta blockers diuretics statins as per orders     HARWANI,MOHAN N 05/24/2014, 12:41 AM

## 2014-05-24 NOTE — Progress Notes (Signed)
Insulin gtt turned off at 1800 per glucostabilizer.

## 2014-05-24 NOTE — Progress Notes (Signed)
Lantus given at 1630. Insulin gtt to be turned off at 1830 per protocol.

## 2014-05-24 NOTE — Progress Notes (Addendum)
225cc of fentanyl wasted in sink; witnessed by 2 RNs.

## 2014-05-24 NOTE — Progress Notes (Signed)
Dr. Sharyn Lull paged about pt's increasing hypertension; Lopressor PO increased to 50mg  BID, first dose now; will continue to monitor closely and update as needed.

## 2014-05-24 NOTE — Progress Notes (Signed)
Subjective:  Patient remains intubated sedated. Events of earlier this morning noted patient had small pneumothorax requiring left chest tube. Consult noted to have minimally elevated troponin I. probably secondary to CPR/hypotension/defibrillation doubt significant MI   Objective:  Vital Signs in the last 24 hours: Temp:  [97.3 F (36.3 C)-100.4 F (38 C)] 99.7 F (37.6 C) (06/28 0900) Pulse Rate:  [39-123] 110 (06/28 0900) Resp:  [0-35] 32 (06/28 0900) BP: (92-183)/(32-165) 128/77 mmHg (06/28 0900) SpO2:  [91 %-100 %] 100 % (06/28 0900) Arterial Line BP: (97-169)/(55-99) 125/66 mmHg (06/28 0900) FiO2 (%):  [70 %-100 %] 70 % (06/28 0900) Weight:  [81.647 kg (180 lb)] 81.647 kg (180 lb) (06/27 2320)  Intake/Output from previous day: 06/27 0701 - 06/28 0700 In: 88.1 [I.V.:88.1] Out: -  Intake/Output from this shift: Total I/O In: 169.4 [I.V.:69.4; IV Piggyback:100] Out: 315 [Urine:315]  Physical Exam: Neck: no adenopathy, no carotid bruit and supple, symmetrical, trachea midline Lungs: Decreased breath sound at bases clear anteriorly air entry improved Heart: regular rate and rhythm, S1, S2 normal and Soft S3 gallop Abdomen: soft, non-tender; bowel sounds normal; no masses,  no organomegaly Extremities: extremities normal, atraumatic, no cyanosis or edema  Lab Results:  Recent Labs  05/23/14 2310 05/23/14 2319  WBC 14.6*  --   HGB 11.7* 13.3  PLT 227  --     Recent Labs  05/24/14 0117 05/24/14 0235  NA 139 138  K 5.8* 4.6  CL 100 100  CO2 17* 22  GLUCOSE 383* 122*  BUN 15 21  CREATININE 1.09 0.85    Recent Labs  05/24/14 0118 05/24/14 0632  TROPONINI <0.30 0.40*   Hepatic Function Panel  Recent Labs  05/23/14 2310  PROT 6.4  ALBUMIN 3.0*  AST 108*  ALT 70*  ALKPHOS 144*  BILITOT <0.2*   No results found for this basename: CHOL,  in the last 72 hours No results found for this basename: PROTIME,  in the last 72 hours  Imaging: Imaging  results have been reviewed and Dg Chest Port 1 View  05/24/2014   CLINICAL DATA:  Chest tube placement  EXAM: PORTABLE CHEST - 1 VIEW  COMPARISON:  Chest x-ray from earlier the same day at 2:24 a.m.  FINDINGS: Endotracheal tube remains at the level of the mid thoracic trachea. Left subclavian central line and orogastric tube remain in good position. There is a new small bore left-sided chest tube, with retention curve overlapping the left hilum. Left-sided pneumothorax is no longer visible. The left diaphragm is not well seen, likely atelectasis given the timing. New subcutaneous emphysema in the left chest wall. Perihilar airspace opacity persists.  IMPRESSION: 1. New left chest tube.  No residual pneumothorax. 2. New retrocardiac atelectasis. 3. Perihilar pulmonary edema.   Electronically Signed   By: Tiburcio Pea M.D.   On: 05/24/2014 05:38   Dg Chest Port 1 View  05/24/2014   CLINICAL DATA:  Central line placement.  Concern for pneumothorax.  EXAM: PORTABLE CHEST - 1 VIEW  COMPARISON:  05/23/2014.  FINDINGS: New left subclavian central line, tip at the level of the upper SVC. Unchanged positioning of endotracheal and orogastric tubes, both in good position.  There is an unexpected interface at the left apex and lateral upper chest compatible with pneumothorax. Estimation of pneumothorax volume is limited in the supine position. Ongoing perihilar airspace disease, pattern favoring edema. Mild cardiomegaly is stable from prior.  Critical Value/emergent results were called by telephone at the time of  interpretation on 05/24/2014 at 3:32 AM to RN May, who verbally acknowledged these results and will relay to critical care fellow.  IMPRESSION: 1. Small left pneumothorax. 2. New left subclavian central line is in good position. 3. Persistent perihilar edema.   Electronically Signed   By: Tiburcio PeaJonathan  Watts M.D.   On: 05/24/2014 03:33   Dg Chest Portable 1 View  05/24/2014   CLINICAL DATA:  Cardiac arrest.  EXAM:  PORTABLE CHEST - 1 VIEW  COMPARISON:  None.  FINDINGS: Endotracheal tube is in grossly good position with tip approximately 4 cm above the carina. Nasogastric tube is seen entering the stomach. Defibrillator pads are seen. No pneumothorax or significant pleural effusion is noted. Bilateral perihilar opacities are noted most consistent with edema or less likely pneumonia. Bony thorax appears intact.  IMPRESSION: Endotracheal tube in grossly good position. Bilateral perihilar opacities are noted most consistent with edema or less likely pneumonia.   Electronically Signed   By: Roque LiasJames  Green M.D.   On: 05/24/2014 00:27    Cardiac Studies:  Assessment/Plan:  Status post witnessed cardiac arrest Resolving decompensated systolic heart failure Minimally elevated troponin I. a secondary to CPR/defibrillation/hypertension doubt significant MI  Acute respiratory failure secondary to above/aspiration Status post left small pneumothorax status post chest tube Hypertension Diabetes mellitus History of nonischemic cardiomyopathy Plan Continue present management per CCM We'll add low-dose beta blockers Check 2-D echo Dr. Algie CofferKadakia will follow from tomorrow  LOS: 1 day    [facility] CenterARWANI,MOHAN N 05/24/2014, 9:47 AM

## 2014-05-24 NOTE — H&P (Addendum)
PULMONARY / CRITICAL CARE MEDICINE   Name: Amanda Stewart MRN: [id number] DOB: 06-19-1941    ADMISSION DATE:  05/23/2014  PRIMARY SERVICE: PCCM  CHIEF COMPLAINT:  Witnessed cardiac arrest at home  BRIEF PATIENT DESCRIPTION:  73 years old with PMH relevant for CAD, CHF with LVEF of 40%, HTN, DM. Sustained witnessed cardiac arrest requiring 15 minutes of CPR and 4 rounds of epi and 2 shocks before returning to spontaneous circulation. Initial rhythm was V. Fib. Got a King airway and was intubated at arrival to the ED. Patient opening eyes on voice command and moving all 4 extremities.  SIGNIFICANT EVENTS / STUDIES:  - Chest X ray with bilateral hilar infiltrates likely secondary to pulmonary edema.   LINES / TUBES: - Peripheral IV's - ETT - Foley catheter  CULTURES: - Blood cultures sent  ANTIBIOTICS: No antibiotics  HISTORY OF PRESENT ILLNESS:   73 years old with PMH relevant for CAD, CHF with LVEF of 40%, HTN, DM. Sustained witnessed cardiac arrest requiring 15 minutes of CPR and 4 rounds of epi and 2 shocks before returning to spontaneous circulation. Initial rhythm was V. Fib. Got a King airway and was intubated at arrival to the ED. Patient remained unresponsive. As per her daughter, she is non compliant with her medications. She was doing well this morning and at about 9:30 pm called daughter complaining of severe SOB asking for an ambulance. At arrival of EMS she was in the floor but still with pulse but then became unresponsive and went into V. Fib. Down time of about 15 minutes. At the time of my examination the patient is intubated, opens her eyes on voice command and moves all 4 extremities, does not follow commands, BP stable, saturating 100% on minimal vent settings. EKG showed normal sinus rhythm with left anterior fascicular block Q-wave in V1 to V3 and ST-T wave changes in lateral leads. No STEMI. Cardiology involved since admission to ED.    PAST MEDICAL HISTORY :  Past  Medical History  Diagnosis Date  . Diabetes mellitus without complication   . Hypertension   . Chest pain   . Arrhythmia   . Left bundle branch block   . CAD (coronary artery disease)   . Hyperlipidemia   . Combined systolic and diastolic heart failure   . GERD (gastroesophageal reflux disease)   . Anemia    No past surgical history on file. Prior to Admission medications   Medication Sig Start Date End Date Taking? Authorizing Provider  aspirin EC 81 MG tablet Take 81 mg by mouth daily.   Yes Historical Provider, MD  carvedilol (COREG) 12.5 MG tablet Take 12.5 mg by mouth 2 (two) times daily with a meal.   Yes Historical Provider, MD  linagliptin (TRADJENTA) 5 MG TABS tablet Take 5 mg by mouth daily.   Yes Historical Provider, MD  lisinopril-hydrochlorothiazide (PRINZIDE,ZESTORETIC) 10-12.5 MG per tablet Take 1 tablet by mouth daily.   Yes Historical Provider, MD  metFORMIN (GLUCOPHAGE) 500 MG tablet Take 500 mg by mouth 2 (two) times daily with a meal.   Yes Historical Provider, MD   Allergies  Allergen Reactions  . 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    FAMILY HISTORY:  No family history on file. SOCIAL HISTORY:  has no tobacco, alcohol, and drug history on file.  REVIEW OF SYSTEMS:  Unable to provide  SUBJECTIVE:   VITAL SIGNS: Temp:  [97.3 F (36.3 C)-98.4 F (36.9 C)] 98.4 F (36.9 C) (06/28 0025) Pulse Rate:  [  39-105] 105 (06/28 0030) Resp:  [18-35] 35 (06/28 0030) BP: (107-180)/(36-110) 133/84 mmHg (06/28 0030) SpO2:  [97 %-100 %] 99 % (06/28 0030) FiO2 (%):  [100 %] 100 % (06/27 2250) Weight:  [180 lb (81.647 kg)] 180 lb (81.647 kg) (06/27 2320) HEMODYNAMICS:   VENTILATOR SETTINGS: Vent Mode:  [-] PRVC FiO2 (%):  [100 %] 100 % Set Rate:  [18 bmp-35 bmp] 35 bmp Vt Set:  [500 mL] 500 mL PEEP:  [5 cmH20] 5 cmH20 INTAKE / OUTPUT: Intake/Output   None     PHYSICAL EXAMINATION: General: Intubated, unresponsive, no acute distress. Eyes: Anicteric  sclerae. Pupils are equal but sluggish.  ENT: ETT in place. Trachea at midline.  Lymph: No cervical, supraclavicular, or axillary lymphadenopathy. Heart: Normal S1, S2. No murmurs, rubs, or gallops appreciated. No bruits, equal pulses. Lungs: Normal excursion, no dullness to percussion. Good air movement bilaterally, without wheezes or crackles.  Abdomen: Abdomen soft, non-tender and not distended, normoactive bowel sounds. No hepatosplenomegaly or masses. Musculoskeletal: No clubbing or synovitis. No LE edema Skin: No rashes or lesions Neuro: Patient is unresponsive.   LABS:  CBC  Recent Labs Lab 05/23/14 2310 05/23/14 2319  WBC 14.6*  --   HGB 11.7* 13.3  HCT 38.2 39.0  PLT 227  --    Coag's No results found for this basename: APTT, INR,  in the last 168 hours BMET  Recent Labs Lab 05/23/14 2310 05/23/14 2319  NA 141 139  K 3.9 3.6*  CL 99 106  CO2 15*  --   BUN 13 14  CREATININE 1.09 1.20*  GLUCOSE 417* 408*   Electrolytes  Recent Labs Lab 05/23/14 2310  CALCIUM 8.5   Sepsis Markers  Recent Labs Lab 05/23/14 2331  LATICACIDVEN 11.8*   ABG  Recent Labs Lab 05/23/14 2302 05/24/14 0018  PHART 6.902* 7.231*  PCO2ART 78.3* 45.4*  PO2ART 144.0* 107.0*   Liver Enzymes  Recent Labs Lab 05/23/14 2310  AST 108*  ALT 70*  ALKPHOS 144*  BILITOT <0.2*  ALBUMIN 3.0*   Cardiac Enzymes No results found for this basename: TROPONINI, PROBNP,  in the last 168 hours Glucose No results found for this basename: GLUCAP,  in the last 168 hours  Imaging Dg Chest Portable 1 View  05/24/2014   CLINICAL DATA:  Cardiac arrest.  EXAM: PORTABLE CHEST - 1 VIEW  COMPARISON:  None.  FINDINGS: Endotracheal tube is in grossly good position with tip approximately 4 cm above the carina. Nasogastric tube is seen entering the stomach. Defibrillator pads are seen. No pneumothorax or significant pleural effusion is noted. Bilateral perihilar opacities are noted most  consistent with edema or less likely pneumonia. Bony thorax appears intact.  IMPRESSION: Endotracheal tube in grossly good position. Bilateral perihilar opacities are noted most consistent with edema or less likely pneumonia.   Electronically Signed   By: Roque Lias M.D.   On: 05/24/2014 00:27     CXR:  Endotracheal tube in grossly good position. Bilateral perihilar  opacities are noted most consistent with edema or less likely  pneumonia.   ASSESSMENT / PLAN:  PULMONARY A: 1) Acute respiratory failure secondary to cardiac arrest. Likely primary cardiac event.  2) Aspiration event  P:   - Mechanical ventilation   - PRVC, Vt: 6cc/kg, PEEP: 5, RR: 30, FiO2: 100% and adjust to keep O2 sat > 94%   - VAP prevention order set   - Daily awakening and SBT - Albuterol PRN - Zosyn  CARDIOVASCULAR  A:  1) Witnessed cardiac arrest. No evidence of STEMI on EKG P:  - Cardiology input appreciated. No need for cardiac catheterization for now. - Will follow serial cardiac enzymes - Echocardiogram in am - Insert A. line  RENAL A:   1) Acute renal failure likely secondary to shock / arrest P:   - IVF resuscitation - Will follow BMP  - Electrolyte replenishment  as indicated.  GASTROINTESTINAL A:   1) GERD P:   - IV protonix  HEMATOLOGIC A:   1) No issues P:  - Will follow CBC  INFECTIOUS A:   1) No evidence of acute infection P:   Zosyn to cover for aspiration pneumonia  ENDOCRINE A:   1) DM P:   - Novolog sliding scale per ICU protocol  NEUROLOGIC A:   1) Post cardiac arrest. Opening eyes on voice command and moving all 4 extremities after 10 minutes off propofol.  We will not start hypothermia protocol/  P:   - Sedation with propofol and fentanyl   TODAY'S SUMMARY:   I have personally obtained a history, examined the patient, evaluated laboratory and imaging results, formulated the assessment and plan and placed orders. CRITICAL CARE: The patient is  critically ill with multiple organ systems failure and requires high complexity decision making for assessment and support, frequent evaluation and titration of therapies, application of advanced monitoring technologies and extensive interpretation of multiple databases. Critical Care Time devoted to patient care services described in this note is 60 minutes.   Overton Mam, MD Pulmonary and Critical Care Medicine [facility] Pager: [phone]  05/24/2014, 12:57 AM

## 2014-05-25 ENCOUNTER — Inpatient Hospital Stay (HOSPITAL_COMMUNITY): Payer: Medicare Other

## 2014-05-25 ENCOUNTER — Other Ambulatory Visit: Payer: Self-pay

## 2014-05-25 DIAGNOSIS — N289 Disorder of kidney and ureter, unspecified: Secondary | ICD-10-CM

## 2014-05-25 DIAGNOSIS — I4901 Ventricular fibrillation: Secondary | ICD-10-CM | POA: Diagnosis not present

## 2014-05-25 DIAGNOSIS — J9600 Acute respiratory failure, unspecified whether with hypoxia or hypercapnia: Secondary | ICD-10-CM | POA: Diagnosis not present

## 2014-05-25 DIAGNOSIS — I469 Cardiac arrest, cause unspecified: Secondary | ICD-10-CM

## 2014-05-25 DIAGNOSIS — I442 Atrioventricular block, complete: Secondary | ICD-10-CM

## 2014-05-25 DIAGNOSIS — I214 Non-ST elevation (NSTEMI) myocardial infarction: Secondary | ICD-10-CM | POA: Diagnosis not present

## 2014-05-25 DIAGNOSIS — R570 Cardiogenic shock: Secondary | ICD-10-CM | POA: Diagnosis not present

## 2014-05-25 LAB — BASIC METABOLIC PANEL
BUN: 18 mg/dL (ref 6–23)
CALCIUM: 7.3 mg/dL — AB (ref 8.4–10.5)
CO2: 20 mEq/L (ref 19–32)
Chloride: 105 mEq/L (ref 96–112)
Creatinine, Ser: 1.08 mg/dL (ref 0.50–1.10)
GFR calc Af Amer: 57 mL/min — ABNORMAL LOW (ref >90)
GFR, EST NON AFRICAN AMERICAN: 49 mL/min — AB (ref >90)
Glucose, Bld: 178 mg/dL — ABNORMAL HIGH (ref 70–99)
Potassium: 5.6 mEq/L — ABNORMAL HIGH (ref 3.7–5.3)
SODIUM: 146 meq/L (ref 137–147)

## 2014-05-25 LAB — CBC WITH DIFFERENTIAL/PLATELET
BASOS PCT: 0 % (ref 0–1)
Basophils Absolute: 0 10*3/uL (ref 0.0–0.1)
Eosinophils Absolute: 0 10*3/uL (ref 0.0–0.7)
Eosinophils Relative: 0 % (ref 0–5)
HEMATOCRIT: 32.6 % — AB (ref 36.0–46.0)
HEMOGLOBIN: 11 g/dL — AB (ref 12.0–15.0)
Lymphocytes Relative: 15 % (ref 12–46)
Lymphs Abs: 1.9 10*3/uL (ref 0.7–4.0)
MCH: 31.2 pg (ref 26.0–34.0)
MCHC: 33.7 g/dL (ref 30.0–36.0)
MCV: 92.4 fL (ref 78.0–100.0)
MONO ABS: 0.8 10*3/uL (ref 0.1–1.0)
Monocytes Relative: 6 % (ref 3–12)
NEUTROS ABS: 9.7 10*3/uL — AB (ref 1.7–7.7)
Neutrophils Relative %: 78 % — ABNORMAL HIGH (ref 43–77)
Platelets: 192 10*3/uL (ref 150–400)
RBC: 3.53 MIL/uL — ABNORMAL LOW (ref 3.87–5.11)
RDW: 13.9 % (ref 11.5–15.5)
WBC: 12.4 10*3/uL — ABNORMAL HIGH (ref 4.0–10.5)

## 2014-05-25 LAB — HEPATIC FUNCTION PANEL
ALBUMIN: 2.9 g/dL — AB (ref 3.5–5.2)
ALT: 58 U/L — AB (ref 0–35)
AST: 33 U/L (ref 0–37)
Alkaline Phosphatase: 82 U/L (ref 39–117)
BILIRUBIN INDIRECT: 0.4 mg/dL (ref 0.3–0.9)
Bilirubin, Direct: 0.2 mg/dL (ref 0.0–0.3)
TOTAL PROTEIN: 6.3 g/dL (ref 6.0–8.3)
Total Bilirubin: 0.6 mg/dL (ref 0.3–1.2)

## 2014-05-25 LAB — GLUCOSE, CAPILLARY
GLUCOSE-CAPILLARY: 108 mg/dL — AB (ref 70–99)
Glucose-Capillary: 115 mg/dL — ABNORMAL HIGH (ref 70–99)
Glucose-Capillary: 149 mg/dL — ABNORMAL HIGH (ref 70–99)
Glucose-Capillary: 137 mg/dL — ABNORMAL HIGH (ref 70–99)
Glucose-Capillary: 106 mg/dL — ABNORMAL HIGH (ref 70–99)
Glucose-Capillary: 207 mg/dL — ABNORMAL HIGH (ref 70–99)

## 2014-05-25 LAB — HEPARIN LEVEL (UNFRACTIONATED)
HEPARIN UNFRACTIONATED: 0.78 [IU]/mL — AB (ref 0.30–0.70)
HEPARIN UNFRACTIONATED: 0.43 [IU]/mL (ref 0.30–0.70)
Heparin Unfractionated: 0.34 IU/mL (ref 0.30–0.70)

## 2014-05-25 LAB — PATHOLOGIST SMEAR REVIEW

## 2014-05-25 LAB — PROCALCITONIN: Procalcitonin: 5.44 ng/mL

## 2014-05-25 LAB — PHOSPHORUS: Phosphorus: 2.7 mg/dL (ref 2.3–4.6)

## 2014-05-25 LAB — LACTIC ACID, PLASMA: Lactic Acid, Venous: 1.2 mmol/L (ref 0.5–2.2)

## 2014-05-25 LAB — MAGNESIUM: Magnesium: 1.5 mg/dL (ref 1.5–2.5)

## 2014-05-25 LAB — PRO B NATRIURETIC PEPTIDE: Pro B Natriuretic peptide (BNP): 1748 pg/mL — ABNORMAL HIGH (ref 0–125)

## 2014-05-25 MED ORDER — FUROSEMIDE 10 MG/ML IJ SOLN
40.0000 mg | Freq: Two times a day (BID) | INTRAMUSCULAR | Status: DC
Start: 2014-05-25 — End: 2014-05-28
  Administered 2014-05-25 – 2014-05-28 (×6): 40 mg via INTRAVENOUS
  Filled 2014-05-25 (×8): qty 4

## 2014-05-25 MED ORDER — PIPERACILLIN-TAZOBACTAM 3.375 G IVPB: 3.3750 g | Freq: Three times a day (TID) | INTRAVENOUS | Status: DC

## 2014-05-25 MED ORDER — VITAL 1.5 CAL PO LIQD: 1000.0000 mL | ORAL | Status: DC

## 2014-05-25 MED ORDER — PRO-STAT SUGAR FREE PO LIQD
30.0000 mL | Freq: Four times a day (QID) | ORAL | Status: DC
  Administered 2014-05-25 – 2014-05-26 (×5): 30 mL
  Filled 2014-05-25 (×10): qty 30

## 2014-05-25 MED ORDER — ACETAMINOPHEN 160 MG/5ML PO SOLN
650.0000 mg | Freq: Four times a day (QID) | ORAL | Status: DC | PRN
  Filled 2014-05-25: qty 20.3

## 2014-05-25 MED ORDER — VITAL 1.5 CAL PO LIQD
1000.0000 mL | ORAL | Status: DC
  Administered 2014-05-25 – 2014-05-26 (×2): 1000 mL
  Filled 2014-05-25 (×4): qty 1000

## 2014-05-25 MED ORDER — PIPERACILLIN-TAZOBACTAM 3.375 G IVPB
3.3750 g | Freq: Three times a day (TID) | INTRAVENOUS | Status: DC
  Filled 2014-05-25 (×3): qty 50

## 2014-05-25 MED ORDER — PIPERACILLIN-TAZOBACTAM 3.375 G IVPB
3.3750 g | Freq: Three times a day (TID) | INTRAVENOUS | Status: AC
  Administered 2014-05-25 – 2014-06-01 (×21): 3.375 g via INTRAVENOUS
  Filled 2014-05-25 (×21): qty 50

## 2014-05-25 MED ORDER — LABETALOL HCL 5 MG/ML IV SOLN
10.0000 mg | INTRAVENOUS | Status: DC | PRN
  Administered 2014-05-25 – 2014-05-27 (×6): 10 mg via INTRAVENOUS
  Filled 2014-05-25 (×6): qty 4

## 2014-05-25 MED ORDER — ADULT MULTIVITAMIN LIQUID CH
5.0000 mL | Freq: Every day | ORAL | Status: DC
  Administered 2014-05-25 – 2014-06-01 (×8): 5 mL
  Filled 2014-05-25 (×8): qty 5

## 2014-05-25 MED ORDER — MAGNESIUM SULFATE 4000MG/100ML IJ SOLN
4.0000 g | Freq: Once | INTRAMUSCULAR | Status: AC
  Administered 2014-05-25: 4 g via INTRAVENOUS
  Filled 2014-05-25 (×2): qty 100

## 2014-05-25 NOTE — Progress Notes (Signed)
PULMONARY / CRITICAL CARE MEDICINE   Name: Amanda Stewart MRN: [id number] DOB: [birth year]    ADMISSION DATE:  05/23/2014  PRIMARY SERVICE: PCCM  CHIEF COMPLAINT:  Witnessed cardiac arrest at home  BRIEF PATIENT DESCRIPTION:  73 years old with PMH relevant for CAD, CHF with LVEF of 40%, HTN, DM. Sustained witnessed cardiac arrest requiring 15 minutes of CPR and 4 rounds of epi and 2 shocks before returning to spontaneous circulation. Initial rhythm was V. Fib. Got a King airway and was intubated at arrival to the ED. Patient remained unresponsive. As per her daughter, she is non compliant with her medications. She was doing well 05/23/14  morning and at about 9:30 pm called daughter complaining of severe SOB asking for an ambulance. At arrival of EMS she was in the floor but still with pulse but then became unresponsive and went into V. Fib. Down time of about 15 minutes. At the time of PCCM examination 05/23/14 in ER (note done 05/24/14)  the patient is intubated, opens her eyes on voice command and moves all 4 extremities. There are reports of significant vomit and aspiration of vomit in ER EKG showed normal sinus rhythm with left anterior fascicular block Q-wave in V1 to V3 and ST-T wave changes in lateral leads. No STEMI. Cardiology involved since admission to ED. On arrival to ICU: she was noticed to be following commands open eyes to verbal stimulii and so induced hypothermia not initiated.   LINES / TUBES: - Peripheral IV's - ETT - Foley catheter - left subclavian 6/28  - left chest tube (ptx following line) 6/28 >> - right radial aline 6/28 >>  CULTURES: - Blood cultures sent  ANTIBIOTICS: Anti-infectives   Start     Dose/Rate Route Frequency Ordered Stop   05/24/14 1000  Ampicillin-Sulbactam (UNASYN) 3 g in sodium chloride 0.9 % 100 mL IVPB     3 g 100 mL/hr over 60 Minutes Intravenous Every 6 hours 05/24/14 0839     05/24/14 0300  piperacillin-tazobactam (ZOSYN) IVPB 3.375 g  Status:   Discontinued     3.375 g 12.5 mL/hr over 240 Minutes Intravenous 3 times per day 05/24/14 0245 05/24/14 0839      Unasyn 05/24/14 (aspn)  >>05/25/14       SIGNIFICANT EVENTS / STUDIES:  - 05/23/2014:  - 05/24/14: On diprivan: when weaned agitation + and grimace to mouth care. Not on pressors. Not been to cath lab. On 70% fi02. ECHO in progress   SUBJECTIVE/OVERNIGHT/INTERVAL HX 05/25/14: Daughters x 3 at beside. Agitated without following commands on wua. Hypertensive needing diprivan. Spiking fevers despite unasyn    VITAL SIGNS: Temp:  [99.7 F (37.6 C)-101.5 F (38.6 C)] 100.4 F (38 C) (06/29 0800) Pulse Rate:  [41-120] 84 (06/29 0800) Resp:  [0-32] 0 (06/29 0800) BP: (116-221)/(63-110) 195/98 mmHg (06/29 0800) SpO2:  [98 %-100 %] 100 % (06/29 0800) Arterial Line BP: (120-195)/(57-99) 195/99 mmHg (06/29 0800) FiO2 (%):  [40 %-70 %] 40 % (06/29 0744) Weight:  [75.5 kg (166 lb 7.2 oz)] 75.5 kg (166 lb 7.2 oz) (06/29 0400) HEMODYNAMICS: CVP:  [6 mmHg-8 mmHg] 8 mmHg VENTILATOR SETTINGS: Vent Mode:  [-] PRVC FiO2 (%):  [40 %-70 %] 40 % Set Rate:  [32 bmp] 32 bmp Vt Set:  [400 mL] 400 mL PEEP:  [5 cmH20] 5 cmH20 Plateau Pressure:  [19 cmH20-21 cmH20] 21 cmH20 INTAKE / OUTPUT: Intake/Output     06/28 0701 - 06/29 0700 06/29 0701 - 06/30 0700  I.V. (mL/kg) 802.5 (10.6) 13.5 (0.2)   IV Piggyback 400    Total Intake(mL/kg) 1202.5 (15.9) 13.5 (0.2)   Urine (mL/kg/hr) 2930 (1.6)    Chest Tube 32 (0)    Total Output 2962     Net -1759.5 +13.5          PHYSICAL EXAMINATION: General: Intubated, looks critically ill. Eyes: Anicteric sclerae. Pupils are equal but sluggish.  ENT: ETT in place. Trachea at midline.  Lymph: No cervical, supraclavicular, or axillary lymphadenopathy. Heart: Normal S1, S2. No murmurs, rubs, or gallops appreciated. No bruits, equal pulses. Lungs: Normal excursion, no dullness to percussion. Good air movement bilaterally, without wheezes or  crackles.  Abdomen: Abdomen soft, non-tender and not distended, normoactive bowel sounds. No hepatosplenomegaly or masses. Musculoskeletal: No clubbing or synovitis. No LE edema Skin: No rashes or lesions Neuro: RASS -3 on diprivan,. Agitated on WUA  LABS:  PULMONARY  Recent Labs Lab 05/23/14 2302 05/23/14 2319 05/24/14 0018 05/24/14 0426 05/24/14 0606  PHART 6.902*  --  7.231* 7.222* 7.342*  PCO2ART 78.3*  --  45.4* 53.7* 37.2  PO2ART 144.0*  --  107.0* 92.0 193.0*  HCO3 15.4*  --  19.1* 21.8 19.4*  TCO2 18 18 21 23  20.5  O2SAT 96.0  --  97.0 94.0 97.7    CBC  Recent Labs Lab 05/23/14 2310 05/23/14 2319 05/25/14 0455  HGB 11.7* 13.3 11.0*  HCT 38.2 39.0 32.6*  WBC 14.6*  --  12.4*  PLT 227  --  192    COAGULATION  Recent Labs Lab 05/24/14 0745  INR 0.99    CARDIAC    Recent Labs Lab 05/24/14 0118 05/24/14 0632 05/24/14 1253  TROPONINI <0.30 0.40* 0.42*    Recent Labs Lab 05/24/14 0632 05/25/14 0401  PROBNP 1419.0* 1748.0*     CHEMISTRY  Recent Labs Lab 05/23/14 2310 05/23/14 2319 05/24/14 0117 05/24/14 0235 05/24/14 0632 05/25/14 0400 05/25/14 0401  NA 141 139 139 138  --  146  --   K 3.9 3.6* 5.8* 4.6  --  5.6*  --   CL 99 106 100 100  --  105  --   CO2 15*  --  17* 22  --  20  --   GLUCOSE 417* 408* 383* 122*  --  178*  --   BUN 13 14 15 21   --  18  --   CREATININE 1.09 1.20* 1.09 0.85  --  1.08  --   CALCIUM 8.5  --  8.6 9.0  --  7.3*  --   MG  --   --   --   --  1.6  --  1.5  PHOS  --   --   --   --  2.4  --  2.7   Estimated Creatinine Clearance: 45.5 ml/min (by C-G formula based on Cr of 1.08).   LIVER  Recent Labs Lab 05/23/14 2310 05/24/14 0745 05/25/14 0401  AST 108*  --  33  ALT 70*  --  58*  ALKPHOS 144*  --  82  BILITOT <0.2*  --  0.6  PROT 6.4  --  6.3  ALBUMIN 3.0*  --  2.9*  INR  --  0.99  --      INFECTIOUS  Recent Labs Lab 05/23/14 2331 05/24/14 1500 05/24/14 1556 05/25/14 0400  05/25/14 0401  LATICACIDVEN 11.8*  --  1.4 1.2  --   PROCALCITON  --  7.06  --   --  5.44  ENDOCRINE CBG (last 3)   Recent Labs  05/24/14 2332 05/25/14 0357 05/25/14 0747  GLUCAP 115* 149* 137*         IMAGING x48h  Dg Chest Port 1 View  05/25/2014   CLINICAL DATA:  Assess endotracheal tube.  EXAM: PORTABLE CHEST - 1 VIEW  COMPARISON:  05/24/2014.  FINDINGS: The endotracheal tube is stable. It is 3 cm above the carina. The external pacer paddles are again demonstrated. A left-sided drainage catheter is unchanged. The lungs show improved aeration with resolving pulmonary edema. No pleural effusion or pneumothorax.  IMPRESSION: The endotracheal tube is 3 cm above the carina.  Stable support apparatus.  Improved lung aeration with resolving perihilar airspace process, likely pulmonary edema.   Electronically Signed   By: Loralie ChampagneMark  Gallerani M.D.   On: 05/25/2014 07:55   Dg Chest Port 1 View  05/24/2014   CLINICAL DATA:  Chest tube placement  EXAM: PORTABLE CHEST - 1 VIEW  COMPARISON:  Chest x-ray from earlier the same day at 2:24 a.m.  FINDINGS: Endotracheal tube remains at the level of the mid thoracic trachea. Left subclavian central line and orogastric tube remain in good position. There is a new small bore left-sided chest tube, with retention curve overlapping the left hilum. Left-sided pneumothorax is no longer visible. The left diaphragm is not well seen, likely atelectasis given the timing. New subcutaneous emphysema in the left chest wall. Perihilar airspace opacity persists.  IMPRESSION: 1. New left chest tube.  No residual pneumothorax. 2. New retrocardiac atelectasis. 3. Perihilar pulmonary edema.   Electronically Signed   By: Tiburcio PeaJonathan  Watts M.D.   On: 05/24/2014 05:38   Dg Chest Port 1 View  05/24/2014   CLINICAL DATA:  Central line placement.  Concern for pneumothorax.  EXAM: PORTABLE CHEST - 1 VIEW  COMPARISON:  05/23/2014.  FINDINGS: New left subclavian central line, tip  at the level of the upper SVC. Unchanged positioning of endotracheal and orogastric tubes, both in good position.  There is an unexpected interface at the left apex and lateral upper chest compatible with pneumothorax. Estimation of pneumothorax volume is limited in the supine position. Ongoing perihilar airspace disease, pattern favoring edema. Mild cardiomegaly is stable from prior.  Critical Value/emergent results were called by telephone at the time of interpretation on 05/24/2014 at 3:32 AM to RN May, who verbally acknowledged these results and will relay to critical care fellow.  IMPRESSION: 1. Small left pneumothorax. 2. New left subclavian central line is in good position. 3. Persistent perihilar edema.   Electronically Signed   By: Tiburcio PeaJonathan  Watts M.D.   On: 05/24/2014 03:33   Dg Chest Portable 1 View  05/24/2014   CLINICAL DATA:  Cardiac arrest.  EXAM: PORTABLE CHEST - 1 VIEW  COMPARISON:  None.  FINDINGS: Endotracheal tube is in grossly good position with tip approximately 4 cm above the carina. Nasogastric tube is seen entering the stomach. Defibrillator pads are seen. No pneumothorax or significant pleural effusion is noted. Bilateral perihilar opacities are noted most consistent with edema or less likely pneumonia. Bony thorax appears intact.  IMPRESSION: Endotracheal tube in grossly good position. Bilateral perihilar opacities are noted most consistent with edema or less likely pneumonia.   Electronically Signed   By: Roque LiasJames  Green M.D.   On: 05/24/2014 00:27       ASSESSMENT / PLAN:  PULMONARY A: 1) Acute respiratory failure secondary to cardiac arrest. Likely primary cardiac event.  2) Aspiration event   - on  40% fio2 , failed SBT due to agitation   P:   - Mechanical ventilation;   - PRVC, Vt: 6cc/kg, PEEP: 5, RR: 30, FiO2: 100% and adjust to keep O2 sat > 92%   - VAP prevention order set   - Daily awakening and SBT when meets criteria - Albuterol PRN -  Zosyn  CARDIOVASCULAR A:  1) Witnessed cardiac arrest. No evidence of STEMI on EKG. Hx of NICM.   - mild trop bump. Per cards: no need for cath now. On Iv Heparin per cards P:  - - Echocardiogram in am ongoing; await result -  IV heparin gtt per cards - increase lasix  RENAL A:   1) Acute renal failure likely secondary to shock / arrest  - improved but has low mag, high k -on kcl  P:   -dc kcl - increase lasix - replete mag - - Will follow BMP ; check mag and phos - Electrolyte replenishment  as indicated.  GASTROINTESTINAL A:   1) GERD P:   - IV protonix - start tube feeds 05/25/14  HEMATOLOGIC A:   1) No issues P:  - Will follow CBC  INFECTIOUS A:   1) Aspirated at admit in ER 05/23/2014  - still with fevers despite unasyn P:   Change to zosyn to cover for aspiration pneumonia  ENDOCRINE A:   1) DM P:   - Novolog sliding scale per ICU protocol  NEUROLOGIC A:   1) Post cardiac arrest. At admission: Opening eyes on voice command and moving all 4 extremities after 10 minutes off propofol.    P:   - Sedation with propofol and fentanyl prn   TODAY'S SUMMARY:  Discussed with 3 daughters - one of them is cardiac nurse. Describe patient as stubborn, independent and already planned her funeral. They report she will be angry that they got her intubated and had cpr done on her. So, moving forward No CPR, NO DEFIB. Continue current medical care for fe to several days to see if mental status improves and she can be extubated. Definite NO TRACH, NO LTACH     The patient is critically ill with multiple organ systems failure and requires high complexity decision making for assessment and support, frequent evaluation and titration of therapies, application of advanced monitoring technologies and extensive interpretation of multiple databases.   Critical Care Time devoted to patient care services described in this note is  35  Minutes.  Dr. Kalman Shan, M.D.,  [facility].C.P Pulmonary and Critical Care Medicine Staff Physician [facility] [place] Pulmonary and Critical Care Pager: [phone], If no answer or between  15:00h - 7:00h: call [phone]  05/25/2014 8:51 AM

## 2014-05-25 NOTE — Progress Notes (Addendum)
Suctioned small pieces of food from the back of patient's mouth during mouth care. [place], Mitzi Hansen, RN

## 2014-05-25 NOTE — Progress Notes (Signed)
INITIAL NUTRITION ASSESSMENT  DOCUMENTATION CODES Per approved criteria  -Not Applicable   INTERVENTION:  Initiate Vital 1.5 formula at 20 ml/hr and increase by 10 ml in 4 hours to goal rate of 30 ml/hr with Prostat liquid protein 30 ml QID to provide 1480 kcals, 109 gm protein, 552 ml of free water -- rest of estimated kcals to be met with current Propofol infusion Liquid MVI daily via tube RD to follow for nutrition care plan  NUTRITION DIAGNOSIS: Inadequate oral intake related to inability to eat as evidenced by NPO status  Goal: Pt to meet >/= 90% of their estimated nutrition needs   Monitor:  TF regimen & tolerance, respiratory status, weight, labs, I/O's  Reason for Assessment: Consult  73 y.o. female  Admitting Dx: cardiac arrest  ASSESSMENT: 73 years old with PMH relevant for CAD, CHF with LVEF of 40%, HTN, DM. Sustained witnessed cardiac arrest requiring 15 minutes of CPR and 4 rounds of epi and 2 shocks before returning to spontaneous circulation. Initial rhythm was V. Fib. Got a King airway and was intubated at arrival to the ED. Patient opening eyes on voice command and moving all 4 extremities.  Patient is currently intubated on ventilator support -- OGT in place MV: 13 L/min Temp (24hrs), Avg:100.7 F (38.2 C), Min:100 F (37.8 C), Max:101.5 F (38.6 C)   Propofol: 9.8 ml/hr -----> 259 fat kcals  RD consulted for TF initiation & management.  Height: Ht Readings from Last 1 Encounters:  05/23/14 5' 4"  (1.626 m)    Weight: Wt Readings from Last 1 Encounters:  05/25/14 166 lb 7.2 oz (75.5 kg)    Ideal Body Weight: 120 lb  % Ideal Body Weight: 138%  Wt Readings from Last 10 Encounters:  05/25/14 166 lb 7.2 oz (75.5 kg)    Usual Body Weight: unable to obtain  % Usual Body Weight: ---  BMI:  Body mass index is 28.56 kg/(m^2).  Estimated Nutritional Needs: Kcal: 1610 Protein: 110-120 gm Fluid: per MD  Skin: Intact  Diet Order:  NPO  EDUCATION NEEDS: -No education needs identified at this time   Intake/Output Summary (Last 24 hours) at 05/25/14 1404 Last data filed at 05/25/14 1300  Gross per 24 hour  Intake 1253.06 ml  Output   1687 ml  Net -433.94 ml    Labs:   Recent Labs Lab 05/24/14 0117 05/24/14 0235 05/24/14 0632 05/25/14 0400 05/25/14 0401  NA 139 138  --  146  --   K 5.8* 4.6  --  5.6*  --   CL 100 100  --  105  --   CO2 17* 22  --  20  --   BUN 15 21  --  18  --   CREATININE 1.09 0.85  --  1.08  --   CALCIUM 8.6 9.0  --  7.3*  --   MG  --   --  1.6  --  1.5  PHOS  --   --  2.4  --  2.7  GLUCOSE 383* 122*  --  178*  --     CBG (last 3)   Recent Labs  05/25/14 0357 05/25/14 0747 05/25/14 1226  GLUCAP 149* 137* 106*    Scheduled Meds: . antiseptic oral rinse  15 mL Mouth Rinse QID  . aspirin  81 mg Oral Daily  . chlorhexidine  15 mL Mouth Rinse BID  . furosemide  40 mg Intravenous BID  . insulin aspart  0-15 Units  Subcutaneous 6 times per day  . insulin glargine  10 Units Subcutaneous QHS  . metoprolol tartrate  50 mg Oral BID  . pantoprazole (PROTONIX) IV  40 mg Intravenous Q24H  . piperacillin-tazobactam (ZOSYN)  IV  3.375 g Intravenous 3 times per day    Continuous Infusions: . sodium chloride 10 mL/hr at 05/25/14 0700  . heparin 750 Units/hr (05/25/14 0700)  . propofol 20 mcg/kg/min (05/25/14 1012)    Past Medical History  Diagnosis Date  . Diabetes mellitus without complication   . Hypertension   . Chest pain   . Arrhythmia   . Left bundle branch block   . CAD (coronary artery disease)   . Hyperlipidemia   . Combined systolic and diastolic heart failure   . GERD (gastroesophageal reflux disease)   . Anemia     No past surgical history on file.  Arthur Holms, RD, LDN Pager #: [phone] After-Hours Pager #: [phone]

## 2014-05-25 NOTE — Progress Notes (Signed)
PCCMD  Informed about potassium of 5.6.

## 2014-05-25 NOTE — Clinical Documentation Improvement (Signed)
Please clarify level and acuity of CHF this admission.Thank you.  Chronic Systolic Congestive Heart Failure Chronic Diastolic Congestive Heart Failure Chronic Systolic & Diastolic Congestive Heart Failure Acute Systolic Congestive Heart Failure Acute Diastolic Congestive Heart Failure Acute Systolic & Diastolic Congestive Heart Failure Acute on Chronic Systolic Congestive Heart Failure Acute on Chronic Diastolic Congestive Heart Failure Acute on Chronic Systolic & Diastolic Congestive Heart Failure Other Condition________________________________________ Cannot Clinically Determine  Supporting Information: "Acute pulmonary edema rule out ischemia rule out MI" 05/24/2014 "Resolving decompensated systolic heart failure"   Risk Factors: PMH: Combined systolic and diastolic heart failure   Signs & Symptoms:Respiratory:  Bilateral coarse breath sounds and rales noted   Diagnostics:EXAM: PORTABLE CHEST - 1 VIEW COMPARISONBilateral perihilar opacities are noted most consistent with edema or less likely pneumonia. Bony thorax appears intact. IMPRESSION: Endotracheal tube in grossly good position. Bilateral perihilar opacities are noted most consistent with edema or less likely pneumonia   Treatment:2-D echo, serial enzymes and EKG, Start aspirin heparin low-dose beta blockers diuretics statins      Thank You, Andy Gauss ,RN Clinical Documentation Specialist:  [phone]  [facility]- Health Information Management

## 2014-05-25 NOTE — Progress Notes (Signed)
Transported to CT and back without issues. Patient back in room doing well.

## 2014-05-25 NOTE — Progress Notes (Signed)
ANTICOAGULATION/ANTIBIOTIC CONSULT NOTE - Follow Up Consult  Pharmacy Consult for Heparin  Indication: chest pain/ACS   Allergies  Allergen Reactions  . 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    Patient Measurements: Height: 5\' 4"  (162.6 cm) Weight: 166 lb 7.2 oz (75.5 kg) IBW/kg (Calculated) : 54.7 Heparin Dosing Weight: ~70kg  Vital Signs: Temp: 100.8 F (38.2 C) (06/29 2000) BP: 194/84 mmHg (06/29 1900) Pulse Rate: 85 (06/29 2000)  Labs:  Recent Labs  05/23/14 2310 05/23/14 2319 05/24/14 0117 05/24/14 0118 05/24/14 0235 05/24/14 0350 05/24/14 0745 05/24/14 1253  05/25/14 0400 05/25/14 0402 05/25/14 0455 05/25/14 1240 05/25/14 1942  HGB 11.7* 13.3  --   --   --   --   --   --   --   --   --  11.0*  --   --   HCT 38.2 39.0  --   --   --   --   --   --   --   --   --  32.6*  --   --   PLT 227  --   --   --   --   --   --   --   --   --   --  192  --   --   LABPROT  --   --   --   --   --   --  13.1  --   --   --   --   --   --   --   INR  --   --   --   --   --   --  0.99  --   --   --   --   --   --   --   HEPARINUNFRC  --   --   --   --   --   --   --   --   < >  --  0.78*  --  0.43 0.34  CREATININE 1.09 1.20* 1.09  --  0.85  --   --   --   --  1.08  --   --   --   --   TROPONINI  --   --   --  <0.30  --  0.40*  --  0.42*  --   --   --   --   --   --   < > = values in this interval not displayed.  Estimated Creatinine Clearance: 45.5 ml/min (by C-G formula based on Cr of 1.08).   Medications:  Heparin @ 750 units/hr  Assessment: 72yof s/p cardiac arrest continues on heparin for positive troponins, though cardiology doubts significant MI. Heparin level continues to be therapeutic on current rate. CBC stable. No bleeding reported.  Goal of Therapy:  Heparin level 0.3-0.7 units/ml Monitor platelets by anticoagulation protocol: Yes   Plan:  1) Continue heparin at 750 units/hr 2) Check heparin level daily with CBC  Sheppard Coil PharmD., BCPS Clinical  Pharmacist Pager [phone] 05/25/2014 8:50 PM

## 2014-05-25 NOTE — Care Management Note (Signed)
    Page 1 of 2   06/01/2014     11:47:00 AM CARE MANAGEMENT NOTE 06/01/2014  Patient:  Amanda Stewart, Amanda Stewart   Account Number:  [id number]  Date Initiated:  05/25/2014  Documentation initiated by:  AMERSON,JULIE  Subjective/Objective Assessment:   Pt s/p witnessed cardiac arrest with acute resp failure. Pt has 3 daughters at bedside.     Action/Plan:   Family states pt would not want trach or LTAC, per progress notes.  Will monitor progress.   Anticipated DC Date:  06/04/2014   Anticipated DC Plan:  SKILLED NURSING FACILITY         Choice offered to / List presented to:  C-4 Adult Children   DME arranged  3-N-1  Levan Hurst      DME agency  [facility]     [facility] arranged  HH-1 RN  HH-10 DISEASE MANAGEMENT  HH-2 PT  HH-3 OT  HH-4 NURSE'S AIDE      HH agency  [facility]   Status of service:  Completed, signed off Medicare Important Message given?  YES (If response is "NO", the following Medicare IM given date fields will be blank) Date Medicare IM given:  05/29/2014 Medicare IM given by:  Junius Creamer Date Additional Medicare IM given:  06/01/2014 Additional Medicare IM given by:  Hajer Dwyer GRAVES-BIGELOW  Discharge Disposition:  HOME [facility] SERVICES  Per UR Regulation:  Reviewed for med. necessity/level of care/duration of stay  If discussed at Long Length of Stay Meetings, dates discussed:    Comments:  06-01-14 1145 Tomi Bamberger, RN,BSN [phone] Referral made with [facility] [place]. SOC to begin within 24-48 hours post d/c. No further needs from CM at this time.

## 2014-05-25 NOTE — Progress Notes (Signed)
ANTICOAGULATION/ANTIBIOTIC CONSULT NOTE - Follow Up Consult  Pharmacy Consult for Heparin and Zosyn Indication: chest pain/ACS and aspiration pneumonia  Allergies  Allergen Reactions  . 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    Patient Measurements: Height: 5\' 4"  (162.6 cm) Weight: 166 lb 7.2 oz (75.5 kg) IBW/kg (Calculated) : 54.7 Heparin Dosing Weight: ~70kg  Vital Signs: Temp: 100.2 F (37.9 C) (06/29 1200) BP: 148/88 mmHg (06/29 1300) Pulse Rate: 77 (06/29 1300)  Labs:  Recent Labs  05/23/14 2310 05/23/14 2319 05/24/14 0117 05/24/14 0118 05/24/14 0235 05/24/14 8891 05/24/14 0745 05/24/14 1253 05/24/14 1837 05/25/14 0400 05/25/14 0402 05/25/14 0455 05/25/14 1240  HGB 11.7* 13.3  --   --   --   --   --   --   --   --   --  11.0*  --   HCT 38.2 39.0  --   --   --   --   --   --   --   --   --  32.6*  --   PLT 227  --   --   --   --   --   --   --   --   --   --  192  --   LABPROT  --   --   --   --   --   --  13.1  --   --   --   --   --   --   INR  --   --   --   --   --   --  0.99  --   --   --   --   --   --   HEPARINUNFRC  --   --   --   --   --   --   --   --  0.78*  --  0.78*  --  0.43  CREATININE 1.09 1.20* 1.09  --  0.85  --   --   --   --  1.08  --   --   --   TROPONINI  --   --   --  <0.30  --  0.40*  --  0.42*  --   --   --   --   --     Estimated Creatinine Clearance: 45.5 ml/min (by C-G formula based on Cr of 1.08).   Medications:  Heparin @ 750 units/hr  Assessment: 72yof s/p cardiac arrest continues on heparin for positive troponins, though cardiology doubts significant MI. Heparin level is therapeutic. CBC stable. No bleeding reported.  She was also on unasyn for suspected aspiration pneumonia but has had persistent fevers so will broaden back to zosyn. Renal function is stable. 6/28 Zosyn x 1 dose 6/28 Unasyn>>6/29 6/29 Zosyn>>  Goal of Therapy:  Heparin level 0.3-0.7 units/ml Monitor platelets by anticoagulation protocol: Yes    Plan:  1) Continue heparin at 750 units/hr 2) Check heparin level in 8 hours to confirm 3) Zosyn 3.375g IV q8 (4 hour infusion)  Fredrik Rigger 05/25/2014,1:26 PM

## 2014-05-25 NOTE — Progress Notes (Signed)
Ref: [name], MD   Subjective:  Remains intubated and sedated. LV function with 40 % EF. Acute systolic heart failure and acute respiratory failure. Some agitation and eye opening at times. No head injury suspected. T max 101.1 F  Monitor Sinus rhythm with 1st degree AV block, LBBB.  Objective:  Vital Signs in the last 24 hours: Temp:  [94.1 F (34.5 C)-101.1 F (38.4 C)] 100.2 F (37.9 C) (06/29 1800) Pulse Rate:  [41-115] 75 (06/29 1800) Cardiac Rhythm:  [-] Normal sinus rhythm (06/29 0800) Resp:  [0-32] 32 (06/29 1800) BP: (112-221)/(46-110) 130/56 mmHg (06/29 1800) SpO2:  [98 %-100 %] 100 % (06/29 1800) Arterial Line BP: (114-195)/(46-99) 127/48 mmHg (06/29 1800) FiO2 (%):  [40 %] 40 % (06/29 1649) Weight:  [75.5 kg (166 lb 7.2 oz)] 75.5 kg (166 lb 7.2 oz) (06/29 0400)  Physical Exam: BP Readings from Last 1 Encounters:  05/25/14 130/56    Wt Readings from Last 1 Encounters:  05/25/14 75.5 kg (166 lb 7.2 oz)    Weight change: -6.147 kg (-13 lb 8.8 oz)  HEENT: [place]/AT, Eyes-Brown, 3 mm pupil, reacting to light. EOMI, Conjunctiva-Pink, Sclera-Non-icteric Neck: No JVD, No bruit, Trachea midline. Lungs:  Clearing, Bilateral. Cardiac: Irregular rhythm, normal S1 and S2, no S3. II/VI systolic murmur. Abdomen:  Soft, non-tender. Extremities:  No edema present. No cyanosis. No clubbing. CNS: AxOx0, Cranial nerves grossly intact. Random extremity movement. Skin: Warm and dry.   Intake/Output from previous day: 06/28 0701 - 06/29 0700 In: 1202.5 [I.V.:802.5; IV Piggyback:400] Out: 2962 [Urine:2930; Chest Tube:32]    Lab Results: BMET    Component Value Date/Time   NA 146 05/25/2014 0400   NA 138 05/24/2014 0235   NA 139 05/24/2014 0117   K 5.6* 05/25/2014 0400   K 4.6 05/24/2014 0235   K 5.8* 05/24/2014 0117   CL 105 05/25/2014 0400   CL 100 05/24/2014 0235   CL 100 05/24/2014 0117   CO2 20 05/25/2014 0400   CO2 22 05/24/2014 0235   CO2 17* 05/24/2014 0117   GLUCOSE  178* 05/25/2014 0400   GLUCOSE 122* 05/24/2014 0235   GLUCOSE 383* 05/24/2014 0117   BUN 18 05/25/2014 0400   BUN 21 05/24/2014 0235   BUN 15 05/24/2014 0117   CREATININE 1.08 05/25/2014 0400   CREATININE 0.85 05/24/2014 0235   CREATININE 1.09 05/24/2014 0117   CALCIUM 7.3* 05/25/2014 0400   CALCIUM 9.0 05/24/2014 0235   CALCIUM 8.6 05/24/2014 0117   GFRNONAA 49* 05/25/2014 0400   GFRNONAA 66* 05/24/2014 0235   GFRNONAA 49* 05/24/2014 0117   GFRAA 57* 05/25/2014 0400   GFRAA 76* 05/24/2014 0235   GFRAA 57* 05/24/2014 0117   CBC    Component Value Date/Time   WBC 12.4* 05/25/2014 0455   RBC 3.53* 05/25/2014 0455   HGB 11.0* 05/25/2014 0455   HCT 32.6* 05/25/2014 0455   PLT 192 05/25/2014 0455   MCV 92.4 05/25/2014 0455   MCH 31.2 05/25/2014 0455   MCHC 33.7 05/25/2014 0455   RDW 13.9 05/25/2014 0455   LYMPHSABS 1.9 05/25/2014 0455   MONOABS 0.8 05/25/2014 0455   EOSABS 0.0 05/25/2014 0455   BASOSABS 0.0 05/25/2014 0455   HEPATIC Function Panel  Recent Labs  05/23/14 2310 05/25/14 0401  PROT 6.4 6.3   HEMOGLOBIN A1C No components found with this basename: HGA1C,  MPG   CARDIAC ENZYMES Lab Results  Component Value Date   TROPONINI 0.42* 05/24/2014   TROPONINI 0.40* 05/24/2014  TROPONINI <0.30 05/24/2014   BNP  Recent Labs  05/24/14 0632 05/25/14 0401  PROBNP 1419.0* 1748.0*   TSH No results found for this basename: TSH,  in the last 8760 hours CHOLESTEROL No results found for this basename: CHOL,  in the last 8760 hours  Scheduled Meds: . antiseptic oral rinse  15 mL Mouth Rinse QID  . aspirin  81 mg Oral Daily  . chlorhexidine  15 mL Mouth Rinse BID  . feeding supplement (PRO-STAT SUGAR FREE 64)  30 mL Per Tube QID  . feeding supplement (VITAL 1.5 CAL)  1,000 mL Per Tube Q24H  . furosemide  40 mg Intravenous BID  . insulin aspart  0-15 Units Subcutaneous 6 times per day  . insulin glargine  10 Units Subcutaneous QHS  . metoprolol tartrate  50 mg Oral BID  . multivitamin  5  mL Per Tube Daily  . pantoprazole (PROTONIX) IV  40 mg Intravenous Q24H  . piperacillin-tazobactam (ZOSYN)  IV  3.375 g Intravenous 3 times per day   Continuous Infusions: . sodium chloride 10 mL/hr at 05/25/14 0700  . heparin 750 Units/hr (05/25/14 0700)  . propofol 20 mcg/kg/min (05/25/14 1408)   PRN Meds:.acetaminophen (TYLENOL) oral liquid 160 mg/5 mL, albuterol, fentaNYL, labetalol  Assessment/Plan: Status post witnessed cardiac arrest  Resolving decompensated systolic heart failure  Minimally elevated troponin I.  secondary to CPR/defibrillation/hypertension doubt significant MI  Acute respiratory failure secondary to above/aspiration  Status post left small pneumothorax status post chest tube  Hypertension  Diabetes mellitus, II History of nonischemic cardiomyopathy  CT brain for altered mentation. Continue medical treatment. Appreciate critical care management    LOS: 2 days    Orpah CobbAjay Kadakia  MD  05/25/2014, 6:52 PM

## 2014-05-25 NOTE — Progress Notes (Signed)
ANTICOAGULATION CONSULT NOTE - Follow Up Consult  Pharmacy Consult for Heparin  Indication: chest pain/ACS  Allergies  Allergen Reactions  . 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    Patient Measurements: Height: 5\' 4"  (162.6 cm) Weight: 166 lb 7.2 oz (75.5 kg) IBW/kg (Calculated) : 54.7  Vital Signs: Temp: 100.4 F (38 C) (06/29 0430) BP: 175/86 mmHg (06/29 0400) Pulse Rate: 107 (06/29 0430)  Labs:  Recent Labs  05/23/14 2310 05/23/14 2319 05/24/14 0117 05/24/14 0118 05/24/14 0235 05/24/14 6294 05/24/14 0745 05/24/14 1253 05/24/14 1837 05/25/14 0400 05/25/14 0402  HGB 11.7* 13.3  --   --   --   --   --   --   --   --   --   HCT 38.2 39.0  --   --   --   --   --   --   --   --   --   PLT 227  --   --   --   --   --   --   --   --   --   --   LABPROT  --   --   --   --   --   --  13.1  --   --   --   --   INR  --   --   --   --   --   --  0.99  --   --   --   --   HEPARINUNFRC  --   --   --   --   --   --   --   --  0.78*  --  0.78*  CREATININE 1.09 1.20* 1.09  --  0.85  --   --   --   --  1.08  --   TROPONINI  --   --   --  <0.30  --  0.40*  --  0.42*  --   --   --     Estimated Creatinine Clearance: 45.5 ml/min (by C-G formula based on Cr of 1.08).   Medications:  Heparin 900 units/hr  Assessment: 73 y/o F on heparin for elevated troponin. HL is 0.78 despite rate increase. Other labs as above. No issues per RN.   Goal of Therapy:  Heparin level 0.3-0.7 units/ml Monitor platelets by anticoagulation protocol: Yes   Plan:  -Decrease heparin drip to 750 units/hr -1300 HL -Daily CBC/HL -Monitor for bleeding  Abran Duke 05/25/2014,4:48 AM

## 2014-05-25 NOTE — Plan of Care (Signed)
Problem: Consults Goal: Ventilated Patients Patient Education See Patient Education Module for education specifics. Outcome: Not Met (add Reason) sedated

## 2014-05-26 ENCOUNTER — Encounter (HOSPITAL_COMMUNITY)
Admission: EM | Disposition: A | Discharge status: Home Health | Payer: Self-pay | Source: Home / Self Care | Attending: Internal Medicine

## 2014-05-26 ENCOUNTER — Inpatient Hospital Stay (HOSPITAL_COMMUNITY): Payer: Medicare Other

## 2014-05-26 DIAGNOSIS — I214 Non-ST elevation (NSTEMI) myocardial infarction: Secondary | ICD-10-CM | POA: Diagnosis not present

## 2014-05-26 DIAGNOSIS — R570 Cardiogenic shock: Secondary | ICD-10-CM | POA: Diagnosis not present

## 2014-05-26 DIAGNOSIS — J9600 Acute respiratory failure, unspecified whether with hypoxia or hypercapnia: Secondary | ICD-10-CM | POA: Diagnosis not present

## 2014-05-26 DIAGNOSIS — I4901 Ventricular fibrillation: Secondary | ICD-10-CM | POA: Diagnosis not present

## 2014-05-26 DIAGNOSIS — I469 Cardiac arrest, cause unspecified: Secondary | ICD-10-CM | POA: Diagnosis not present

## 2014-05-26 HISTORY — PX: LEFT AND RIGHT HEART CATHETERIZATION WITH CORONARY ANGIOGRAM: SHX5449

## 2014-05-26 LAB — HEPARIN LEVEL (UNFRACTIONATED): HEPARIN UNFRACTIONATED: 0.24 [IU]/mL — AB (ref 0.30–0.70)

## 2014-05-26 LAB — POCT I-STAT 7, (LYTES, BLD GAS, ICA,H+H)
Acid-Base Excess: 4 mmol/L — ABNORMAL HIGH (ref 0.0–2.0)
BICARBONATE: 27.5 meq/L — AB (ref 20.0–24.0)
Calcium, Ion: 1.1 mmol/L — ABNORMAL LOW (ref 1.13–1.30)
HCT: 32 % — ABNORMAL LOW (ref 36.0–46.0)
HEMOGLOBIN: 10.9 g/dL — AB (ref 12.0–15.0)
O2 Saturation: 100 %
PH ART: 7.471 — AB (ref 7.350–7.450)
POTASSIUM: 3.4 meq/L — AB (ref 3.7–5.3)
SODIUM: 141 meq/L (ref 137–147)
TCO2: 29 mmol/L (ref 0–100)
pCO2 arterial: 37.7 mmHg (ref 35.0–45.0)
pO2, Arterial: 194 mmHg — ABNORMAL HIGH (ref 80.0–100.0)

## 2014-05-26 LAB — CBC WITH DIFFERENTIAL/PLATELET
Basophils Absolute: 0 10*3/uL (ref 0.0–0.1)
Basophils Relative: 0 % (ref 0–1)
Eosinophils Absolute: 0.1 10*3/uL (ref 0.0–0.7)
Eosinophils Relative: 1 % (ref 0–5)
HCT: 30.5 % — ABNORMAL LOW (ref 36.0–46.0)
Hemoglobin: 10.2 g/dL — ABNORMAL LOW (ref 12.0–15.0)
Lymphocytes Relative: 11 % — ABNORMAL LOW (ref 12–46)
Lymphs Abs: 1.3 10*3/uL (ref 0.7–4.0)
MCH: 31 pg (ref 26.0–34.0)
MCHC: 33.4 g/dL (ref 30.0–36.0)
MCV: 92.7 fL (ref 78.0–100.0)
Monocytes Absolute: 0.8 10*3/uL (ref 0.1–1.0)
Monocytes Relative: 7 % (ref 3–12)
Neutro Abs: 9.4 10*3/uL — ABNORMAL HIGH (ref 1.7–7.7)
Neutrophils Relative %: 81 % — ABNORMAL HIGH (ref 43–77)
Platelets: 178 10*3/uL (ref 150–400)
RBC: 3.29 MIL/uL — ABNORMAL LOW (ref 3.87–5.11)
RDW: 14 % (ref 11.5–15.5)
WBC: 11.7 10*3/uL — ABNORMAL HIGH (ref 4.0–10.5)

## 2014-05-26 LAB — BASIC METABOLIC PANEL
BUN: 21 mg/dL (ref 6–23)
CALCIUM: 8 mg/dL — AB (ref 8.4–10.5)
CO2: 23 mEq/L (ref 19–32)
Chloride: 100 mEq/L (ref 96–112)
Creatinine, Ser: 1.24 mg/dL — ABNORMAL HIGH (ref 0.50–1.10)
GFR calc Af Amer: 48 mL/min — ABNORMAL LOW (ref >90)
GFR, EST NON AFRICAN AMERICAN: 42 mL/min — AB (ref >90)
GLUCOSE: 245 mg/dL — AB (ref 70–99)
Potassium: 2.9 mEq/L — CL (ref 3.7–5.3)
Sodium: 141 mEq/L (ref 137–147)

## 2014-05-26 LAB — POCT I-STAT 3, ART BLOOD GAS (G3+)
Acid-Base Excess: 7 mmol/L — ABNORMAL HIGH (ref 0.0–2.0)
BICARBONATE: 29.6 meq/L — AB (ref 20.0–24.0)
O2 Saturation: 100 %
TCO2: 31 mmol/L (ref 0–100)
pCO2 arterial: 31.5 mmHg — ABNORMAL LOW (ref 35.0–45.0)
pH, Arterial: 7.58 — ABNORMAL HIGH (ref 7.350–7.450)
pO2, Arterial: 489 mmHg — ABNORMAL HIGH (ref 80.0–100.0)

## 2014-05-26 LAB — PHOSPHORUS: Phosphorus: 3.5 mg/dL (ref 2.3–4.6)

## 2014-05-26 LAB — POCT I-STAT 3, VENOUS BLOOD GAS (G3P V)
Acid-Base Excess: 2 mmol/L (ref 0.0–2.0)
Bicarbonate: 25.9 mEq/L — ABNORMAL HIGH (ref 20.0–24.0)
O2 Saturation: 65 %
PCO2 VEN: 34.7 mmHg — AB (ref 45.0–50.0)
TCO2: 27 mmol/L (ref 0–100)
pH, Ven: 7.481 — ABNORMAL HIGH (ref 7.250–7.300)
pO2, Ven: 31 mmHg (ref 30.0–45.0)

## 2014-05-26 LAB — GLUCOSE, CAPILLARY
GLUCOSE-CAPILLARY: 173 mg/dL — AB (ref 70–99)
GLUCOSE-CAPILLARY: 219 mg/dL — AB (ref 70–99)
GLUCOSE-CAPILLARY: 239 mg/dL — AB (ref 70–99)
Glucose-Capillary: 205 mg/dL — ABNORMAL HIGH (ref 70–99)
Glucose-Capillary: 194 mg/dL — ABNORMAL HIGH (ref 70–99)
Glucose-Capillary: 241 mg/dL — ABNORMAL HIGH (ref 70–99)
Glucose-Capillary: 262 mg/dL — ABNORMAL HIGH (ref 70–99)

## 2014-05-26 LAB — PROCALCITONIN: PROCALCITONIN: 3.38 ng/mL

## 2014-05-26 LAB — URINE CULTURE
COLONY COUNT: NO GROWTH
Culture: NO GROWTH

## 2014-05-26 LAB — POCT ACTIVATED CLOTTING TIME: Activated Clotting Time: 135 seconds

## 2014-05-26 LAB — CK TOTAL AND CKMB (NOT AT ARMC)
CK, MB: <1 ng/mL (ref 0.3–4.0)
Total CK: 185 U/L — ABNORMAL HIGH (ref 7–177)

## 2014-05-26 LAB — LACTIC ACID, PLASMA: Lactic Acid, Venous: 1.4 mmol/L (ref 0.5–2.2)

## 2014-05-26 LAB — MAGNESIUM: Magnesium: 2.3 mg/dL (ref 1.5–2.5)

## 2014-05-26 SURGERY — LEFT AND RIGHT HEART CATHETERIZATION WITH CORONARY ANGIOGRAM
Anesthesia: LOCAL

## 2014-05-26 MED ORDER — NITROGLYCERIN IN D5W 200-5 MCG/ML-% IV SOLN
2.0000 ug/min | INTRAVENOUS | Status: DC
  Administered 2014-05-26: 10 ug/min via INTRAVENOUS
  Administered 2014-05-27: 60 ug/min via INTRAVENOUS
  Filled 2014-05-26: qty 250

## 2014-05-26 MED ORDER — SODIUM CHLORIDE 0.9 % IV SOLN: 250.0000 mL | INTRAVENOUS | Status: DC | PRN

## 2014-05-26 MED ORDER — NITROGLYCERIN IN D5W 200-5 MCG/ML-% IV SOLN
INTRAVENOUS | Status: AC
  Administered 2014-05-26: 10 ug/min via INTRAVENOUS
  Filled 2014-05-26: qty 250

## 2014-05-26 MED ORDER — POTASSIUM CHLORIDE 10 MEQ/50ML IV SOLN
INTRAVENOUS | Status: AC
  Filled 2014-05-26: qty 50

## 2014-05-26 MED ORDER — POTASSIUM CHLORIDE 10 MEQ/50ML IV SOLN
10.0000 meq | INTRAVENOUS | Status: AC
  Administered 2014-05-26 (×6): 10 meq via INTRAVENOUS
  Filled 2014-05-26 (×5): qty 50

## 2014-05-26 MED ORDER — SODIUM CHLORIDE 0.9 % IV SOLN: INTRAVENOUS | Status: DC

## 2014-05-26 MED ORDER — SODIUM CHLORIDE 0.9 % IV SOLN
INTRAVENOUS | Status: DC
  Administered 2014-05-26: 09:00:00 via INTRAVENOUS

## 2014-05-26 MED ORDER — ONDANSETRON HCL 4 MG/2ML IJ SOLN: 4.0000 mg | Freq: Four times a day (QID) | INTRAMUSCULAR | Status: DC | PRN

## 2014-05-26 MED ORDER — HEPARIN (PORCINE) IN NACL 100-0.45 UNIT/ML-% IJ SOLN
1100.0000 [IU]/h | INTRAMUSCULAR | Status: DC
Start: 2014-05-26 — End: 2014-05-27
  Administered 2014-05-27: 850 [IU]/h via INTRAVENOUS
  Filled 2014-05-26 (×2): qty 250

## 2014-05-26 MED ORDER — SODIUM CHLORIDE 0.9 % IJ SOLN: 3.0000 mL | INTRAMUSCULAR | Status: DC | PRN

## 2014-05-26 MED ORDER — HEPARIN (PORCINE) IN NACL 2-0.9 UNIT/ML-% IJ SOLN
INTRAMUSCULAR | Status: AC
  Filled 2014-05-26: qty 1000

## 2014-05-26 MED ORDER — SODIUM CHLORIDE 0.9 % IJ SOLN
3.0000 mL | Freq: Two times a day (BID) | INTRAMUSCULAR | Status: DC
  Administered 2014-05-26: 3 mL via INTRAVENOUS

## 2014-05-26 MED ORDER — LIDOCAINE HCL (PF) 1 % IJ SOLN
INTRAMUSCULAR | Status: AC
  Filled 2014-05-26: qty 30

## 2014-05-26 NOTE — Interval H&P Note (Signed)
History and Physical Interval Note:  05/26/2014 8:12 AM  Amanda Stewart  has presented today for surgery, with the diagnosis of cp  The various methods of treatment have been discussed with the patient and family. After consideration of risks, benefits and other options for treatment, the patient has consented to  Procedure(s): LEFT AND RIGHT HEART CATHETERIZATION WITH CORONARY ANGIOGRAM (N/A) as a surgical intervention .  The patient's history has been reviewed, patient examined, no change in status, stable for surgery.  I have reviewed the patient's chart and labs.  Questions were answered to the patient's satisfaction.     Jaquitta Dupriest S

## 2014-05-26 NOTE — Progress Notes (Signed)
ANTICOAGULATION CONSULT NOTE - Follow Up Consult  Pharmacy Consult for Heparin Indication: chest pain/ACS  Allergies  Allergen Reactions  . 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    Patient Measurements: Height: 5\' 4"  (162.6 cm) Weight: 163 lb 12.8 oz (74.3 kg) IBW/kg (Calculated) : 54.7 Heparin Dosing Weight: 70kg  Vital Signs: Temp: 99.5 F (37.5 C) (06/30 0600) Temp src: Core (Comment) (06/30 0600) BP: 201/112 mmHg (06/30 0800) Pulse Rate: 79 (06/30 1006)  Labs:  Recent Labs  05/23/14 2310  05/24/14 0118 05/24/14 0235 05/24/14 2706 05/24/14 0745 05/24/14 1253  05/25/14 0400  05/25/14 0455 05/25/14 1240 05/25/14 1942 05/26/14 05/26/14 0427 05/26/14 0930  HGB 11.7*  < >  --   --   --   --   --   --   --   --  11.0*  --   --   --  10.2* 10.9*  HCT 38.2  < >  --   --   --   --   --   --   --   --  32.6*  --   --   --  30.5* 32.0*  PLT 227  --   --   --   --   --   --   --   --   --  192  --   --   --  178  --   LABPROT  --   --   --   --   --  13.1  --   --   --   --   --   --   --   --   --   --   INR  --   --   --   --   --  0.99  --   --   --   --   --   --   --   --   --   --   HEPARINUNFRC  --   --   --   --   --   --   --   < >  --   < >  --  0.43 0.34  --  0.24*  --   CREATININE 1.09  < >  --  0.85  --   --   --   --  1.08  --   --   --   --  1.24*  --   --   CKTOTAL  --   --   --   --   --   --   --   --   --   --   --   --   --   --  185*  --   CKMB  --   --   --   --   --   --   --   --   --   --   --   --   --   --  <1.0  --   TROPONINI  --   --  <0.30  --  0.40*  --  0.42*  --   --   --   --   --   --   --   --   --   < > = values in this interval not displayed.  Estimated Creatinine Clearance: 39.3 ml/min (by C-G formula based on Cr of 1.24).  Assessment: 72yof s/p cath to resume heparin for medical management of her CAD. Spoke  to Dr. Algie CofferKadakia and he will likely continue heparin for another 24 hours. Heparin to resume 8 hour post-sheath removal. Sheath  removed at 1054. Heparin rate was increased this morning to 850 units/hr prior to cath but a follow up level was never checked. Will resume at this rate.  Goal of Therapy:  Heparin level 0.3-0.7 units/ml Monitor platelets by anticoagulation protocol: Yes   Plan:  1) At 1900, resume heparin at 850 units/hr 2) Check 8 hour heparin level after resumption  Fredrik RiggerMarkle, Reeve Turnley Sue 05/26/2014,11:39 AM

## 2014-05-26 NOTE — Progress Notes (Signed)
ANTICOAGULATION CONSULT NOTE - Follow Up Consult  Pharmacy Consult for Heparin  Indication: chest pain/ACS  Allergies  Allergen Reactions  . 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    Patient Measurements: Height: 5\' 4"  (162.6 cm) Weight: 163 lb 12.8 oz (74.3 kg) IBW/kg (Calculated) : 54.7  Vital Signs: Temp: 100 F (37.8 C) (06/30 0000) Temp src: Core (Comment) (06/30 0000) BP: 144/57 mmHg (06/30 0332) Pulse Rate: 76 (06/30 0530)  Labs:  Recent Labs  05/23/14 2310 05/23/14 2319  05/24/14 0118 05/24/14 0235 05/24/14 [id number] 05/24/14 0745 05/24/14 1253  05/25/14 0400  05/25/14 0455 05/25/14 1240 05/25/14 1942 05/26/14 05/26/14 0427  HGB 11.7* 13.3  --   --   --   --   --   --   --   --   --  11.0*  --   --   --  10.2*  HCT 38.2 39.0  --   --   --   --   --   --   --   --   --  32.6*  --   --   --  30.5*  PLT 227  --   --   --   --   --   --   --   --   --   --  192  --   --   --  178  LABPROT  --   --   --   --   --   --  13.1  --   --   --   --   --   --   --   --   --   INR  --   --   --   --   --   --  0.99  --   --   --   --   --   --   --   --   --   HEPARINUNFRC  --   --   --   --   --   --   --   --   < >  --   < >  --  0.43 0.34  --  0.24*  CREATININE 1.09 1.20*  < >  --  0.85  --   --   --   --  1.08  --   --   --   --  1.24*  --   CKTOTAL  --   --   --   --   --   --   --   --   --   --   --   --   --   --   --  185*  CKMB  --   --   --   --   --   --   --   --   --   --   --   --   --   --   --  <1.0  TROPONINI  --   --   --  <0.30  --  0.40*  --  0.42*  --   --   --   --   --   --   --   --   < > = values in this interval not displayed.  Estimated Creatinine Clearance: 39.3 ml/min (by C-G formula based on Cr of 1.24).   Medications:  Heparin 750 units/hr  Assessment: 73 y/o F on heparin for elevated troponin. HL is 0.24.  Other labs as above. No issues per RN.   Goal of Therapy:  Heparin level 0.3-0.7 units/ml Monitor platelets by anticoagulation  protocol: Yes   Plan:  -Increase heparin drip to 850 units/hr -1400 HL -Daily CBC/HL -Monitor for bleeding  Abran Duke 05/26/2014,5:38 AM

## 2014-05-26 NOTE — Progress Notes (Signed)
   To Whom It May Concern  This is to state that Amanda Stewart with date of birth of November 05, 1941 is a critically ill patient at [facility] in [place], [place] since 05/23/2014. As of this writing her outcome is uncertain and her expected stay is easily a few weeks. Please do not hesitate to contact the critical care service or medical service in charge of her care at any point.  Please note providers for patient change on a constant basis  Sincerely yours     Dr. Kalman Shan, M.D., [facility].C.P Pulmonary and Critical Care Medicine Staff Physician [facility] Pulmonary and Critical Care Pager:  [phone]  05/26/2014 9:14 AM

## 2014-05-26 NOTE — Progress Notes (Signed)
Patients daugther's at bedside. Daughter Pam, wanted to speak with MD to make patient a full code. It was explained to daughters that changing her code status did not change the course of her care, and that patient is receiving maximum support and would continue to. If there was to be a change in the course of the patient care, then daughters would be notified and would have to give consent prior to changes made. MD was paged and made aware of daughters wishes. MD spoke with daughter, Elita Quick, on the phone. Patient full code for now, will readdress with MD in the am.

## 2014-05-26 NOTE — Progress Notes (Signed)
eLink Physician-Brief Progress Note Patient Name: Amanda Stewart DOB: December 27, 1940 MRN: [id number]  Date of Service  05/26/2014   HPI/Events of Note  Hypokalemia   eICU Interventions  Potassium replaced   Intervention Category Major Interventions: Electrolyte abnormality - evaluation and management  DETERDING,ELIZABETH 05/26/2014, 12:55 AM

## 2014-05-26 NOTE — H&P (View-Only) (Signed)
Ref: [name], MD   Subjective:  Remains intubated and sedated. LV function with 40 % EF. Acute systolic heart failure and acute respiratory failure. Some agitation and eye opening at times. No head injury suspected. T max 101.1 F  Monitor Sinus rhythm with 1st degree AV block, LBBB.  Objective:  Vital Signs in the last 24 hours: Temp:  [94.1 F (34.5 C)-101.1 F (38.4 C)] 100.2 F (37.9 C) (06/29 1800) Pulse Rate:  [41-115] 75 (06/29 1800) Cardiac Rhythm:  [-] Normal sinus rhythm (06/29 0800) Resp:  [0-32] 32 (06/29 1800) BP: (112-221)/(46-110) 130/56 mmHg (06/29 1800) SpO2:  [98 %-100 %] 100 % (06/29 1800) Arterial Line BP: (114-195)/(46-99) 127/48 mmHg (06/29 1800) FiO2 (%):  [40 %] 40 % (06/29 1649) Weight:  [75.5 kg (166 lb 7.2 oz)] 75.5 kg (166 lb 7.2 oz) (06/29 0400)  Physical Exam: BP Readings from Last 1 Encounters:  05/25/14 130/56    Wt Readings from Last 1 Encounters:  05/25/14 75.5 kg (166 lb 7.2 oz)    Weight change: -6.147 kg (-13 lb 8.8 oz)  HEENT: [place]/AT, Eyes-Brown, 3 mm pupil, reacting to light. EOMI, Conjunctiva-Pink, Sclera-Non-icteric Neck: No JVD, No bruit, Trachea midline. Lungs:  Clearing, Bilateral. Cardiac: Irregular rhythm, normal S1 and S2, no S3. II/VI systolic murmur. Abdomen:  Soft, non-tender. Extremities:  No edema present. No cyanosis. No clubbing. CNS: AxOx0, Cranial nerves grossly intact. Random extremity movement. Skin: Warm and dry.   Intake/Output from previous day: 06/28 0701 - 06/29 0700 In: 1202.5 [I.V.:802.5; IV Piggyback:400] Out: 2962 [Urine:2930; Chest Tube:32]    Lab Results: BMET    Component Value Date/Time   NA 146 05/25/2014 0400   NA 138 05/24/2014 0235   NA 139 05/24/2014 0117   K 5.6* 05/25/2014 0400   K 4.6 05/24/2014 0235   K 5.8* 05/24/2014 0117   CL 105 05/25/2014 0400   CL 100 05/24/2014 0235   CL 100 05/24/2014 0117   CO2 20 05/25/2014 0400   CO2 22 05/24/2014 0235   CO2 17* 05/24/2014 0117   GLUCOSE  178* 05/25/2014 0400   GLUCOSE 122* 05/24/2014 0235   GLUCOSE 383* 05/24/2014 0117   BUN 18 05/25/2014 0400   BUN 21 05/24/2014 0235   BUN 15 05/24/2014 0117   CREATININE 1.08 05/25/2014 0400   CREATININE 0.85 05/24/2014 0235   CREATININE 1.09 05/24/2014 0117   CALCIUM 7.3* 05/25/2014 0400   CALCIUM 9.0 05/24/2014 0235   CALCIUM 8.6 05/24/2014 0117   GFRNONAA 49* 05/25/2014 0400   GFRNONAA 66* 05/24/2014 0235   GFRNONAA 49* 05/24/2014 0117   GFRAA 57* 05/25/2014 0400   GFRAA 76* 05/24/2014 0235   GFRAA 57* 05/24/2014 0117   CBC    Component Value Date/Time   WBC 12.4* 05/25/2014 0455   RBC 3.53* 05/25/2014 0455   HGB 11.0* 05/25/2014 0455   HCT 32.6* 05/25/2014 0455   PLT 192 05/25/2014 0455   MCV 92.4 05/25/2014 0455   MCH 31.2 05/25/2014 0455   MCHC 33.7 05/25/2014 0455   RDW 13.9 05/25/2014 0455   LYMPHSABS 1.9 05/25/2014 0455   MONOABS 0.8 05/25/2014 0455   EOSABS 0.0 05/25/2014 0455   BASOSABS 0.0 05/25/2014 0455   HEPATIC Function Panel  Recent Labs  05/23/14 2310 05/25/14 0401  PROT 6.4 6.3   HEMOGLOBIN A1C No components found with this basename: HGA1C,  MPG   CARDIAC ENZYMES Lab Results  Component Value Date   TROPONINI 0.42* 05/24/2014   TROPONINI 0.40* 05/24/2014  TROPONINI <0.30 05/24/2014   BNP  Recent Labs  05/24/14 0632 05/25/14 0401  PROBNP 1419.0* 1748.0*   TSH No results found for this basename: TSH,  in the last 8760 hours CHOLESTEROL No results found for this basename: CHOL,  in the last 8760 hours  Scheduled Meds: . antiseptic oral rinse  15 mL Mouth Rinse QID  . aspirin  81 mg Oral Daily  . chlorhexidine  15 mL Mouth Rinse BID  . feeding supplement (PRO-STAT SUGAR FREE 64)  30 mL Per Tube QID  . feeding supplement (VITAL 1.5 CAL)  1,000 mL Per Tube Q24H  . furosemide  40 mg Intravenous BID  . insulin aspart  0-15 Units Subcutaneous 6 times per day  . insulin glargine  10 Units Subcutaneous QHS  . metoprolol tartrate  50 mg Oral BID  . multivitamin  5  mL Per Tube Daily  . pantoprazole (PROTONIX) IV  40 mg Intravenous Q24H  . piperacillin-tazobactam (ZOSYN)  IV  3.375 g Intravenous 3 times per day   Continuous Infusions: . sodium chloride 10 mL/hr at 05/25/14 0700  . heparin 750 Units/hr (05/25/14 0700)  . propofol 20 mcg/kg/min (05/25/14 1408)   PRN Meds:.acetaminophen (TYLENOL) oral liquid 160 mg/5 mL, albuterol, fentaNYL, labetalol  Assessment/Plan: Status post witnessed cardiac arrest  Resolving decompensated systolic heart failure  Minimally elevated troponin I.  secondary to CPR/defibrillation/hypertension doubt significant MI  Acute respiratory failure secondary to above/aspiration  Status post left small pneumothorax status post chest tube  Hypertension  Diabetes mellitus, II History of nonischemic cardiomyopathy  CT brain for altered mentation. Continue medical treatment. Appreciate critical care management    LOS: 2 days    Ajay Kadakia  MD  05/25/2014, 6:52 PM      

## 2014-05-26 NOTE — CV Procedure (Addendum)
PROCEDURE:  Right and Left heart catheterization with selective coronary angiography, left ventriculogram.  CLINICAL HISTORY:  This is a 73 year old female presented with cardiac arrest and CHF.   The risks, benefits, and details of the procedure were explained to the patient.  The patient verbalized understanding and wanted to proceed.  Informed written consent was obtained.  PROCEDURE TECHNIQUE:  The patient was approached from the right femoral vein using 7 French short sheath and right femoral artery using a 5 French short sheath. Right heart pressures, cardiac output and oxygen saturation were measured using a Swan Ganz catheter placed through right femoral vein. Left coronary angiography was done using a Judkins L4 guide catheter.  Right coronary angiography was done using a Judkins R4 guide catheter. Left ventriculography was done using a pigtail catheter.    CONTRAST:  Total of 40 cc.  COMPLICATIONS:  None.  At the end of the procedure a manual pressure device was used for hemostasis.    HEMODYNAMICS:  Pulmonary pressure was 37/22: Wedge pressure was : RV pressure was 43/13: RA pressure was 14.   Oxygen saturation of blood sample from PA was 65 and AO was 100.  Aortic pressure was 183/82; LV pressure was 182/16; LVEDP 17.  There was no gradient between the left ventricle and aorta.    Cardiac output was 2.79 L/min by Fick method and 4.9 L/Min by Thermodilution.  ANGIOGRAM/CORONARY ARTERIOGRAM:   The left main coronary artery is short and unremarkable.  The left anterior descending artery is unremarkable and wraps around the apex. Diagonal is unremarkable.  The left circumflex artery has mid vessel 99 % stenosis but is less than 1.5 mm vessel. OM is unremarkable  The right coronary artery is dominant and unremarkable.  LEFT VENTRICULOGRAM:  Left ventricular angiogram was not done to conserve dye [email or web address] LV systolic dysfunction with an estimated ejection fraction of 45% by  echocardiogram.  LVEDP was 17 mmHg.  IMPRESSION OF HEART CATHETERIZATION:   1. Normal left main coronary artery. 2. Normal left anterior descending artery and its branches. 3. 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 4. Normal right coronary artery. 5. Normal left ventricular systolic function.  LVEDP 17 mmHg.  Ej. fraction  45 %. 6. Mild to moderately elevated PA and wedge pressures.  RECOMMENDATION:   Medical treatment. NTG drip +/- inotropic support.

## 2014-05-26 NOTE — Progress Notes (Signed)
PULMONARY / CRITICAL CARE MEDICINE   Name: Amanda Stewart MRN: [id number] DOB: 05/12/1941    ADMISSION DATE:  05/23/2014  PRIMARY SERVICE: PCCM  CHIEF COMPLAINT:  Witnessed cardiac arrest at home  BRIEF PATIENT DESCRIPTION:  73 years old with PMH relevant for CAD, CHF with LVEF of 40%, HTN, DM. Sustained witnessed cardiac arrest requiring 15 minutes of CPR and 4 rounds of epi and 2 shocks before returning to spontaneous circulation. Initial rhythm was V. Fib. Got a King airway and was intubated at arrival to the ED. Patient remained unresponsive. As per her daughter, she is non compliant with her medications. She was doing well 05/23/14  morning and at about 9:30 pm called daughter complaining of severe SOB asking for an ambulance. At arrival of EMS she was in the floor but still with pulse but then became unresponsive and went into V. Fib. Down time of about 15 minutes. At the time of PCCM examination 05/23/14 in ER (note done 05/24/14)  the patient is intubated, opens her eyes on voice command and moves all 4 extremities. There are reports of significant vomit and aspiration of vomit in ER EKG showed normal sinus rhythm with left anterior fascicular block Q-wave in V1 to V3 and ST-T wave changes in lateral leads. No STEMI. Cardiology involved since admission to ED. On arrival to ICU: she was noticed to be following commands open eyes to verbal stimulii and so induced hypothermia not initiated.   LINES / TUBES: - Peripheral IV's - ETT - Foley catheter - left subclavian 6/28  - left chest tube (ptx following line) 6/28 >> - right radial aline 6/28 >>  CULTURES: - Blood cultures sent  ANTIBIOTICS:   Unasyn 05/24/14 (aspn)  >>05/25/14 Zosyn 56/29/15 (fevers despite unasyn) >>       SIGNIFICANT EVENTS / STUDIES:  - 05/23/2014:  - 05/24/14: On diprivan: when weaned agitation + and grimace to mouth care. Not on pressors. Not been to cath lab. On 70% fi02. ECHO in progress  - 05/25/14: Daughters x  3 at beside. Agitated without following commands on wua. Hypertensive needing diprivan. Spiking fevers despite unasyn    SUBJECTIVE/OVERNIGHT/INTERVAL HX 05/26/14: Hypertensive. More awake. Nodding and purposeful . Doing SBT. For cath today. FEver improving after abx change  VITAL SIGNS: Temp:  [94.1 F (34.5 C)-100.9 F (38.3 C)] 99.5 F (37.5 C) (06/30 0600) Pulse Rate:  [73-92] 84 (06/30 0800) Resp:  [0-33] 15 (06/30 0800) BP: (108-201)/(46-112) 201/112 mmHg (06/30 0800) SpO2:  [97 %-100 %] 100 % (06/30 0800) Arterial Line BP: (77-191)/(41-95) 190/77 mmHg (06/30 0815) FiO2 (%):  [40 %] 40 % (06/30 0757) Weight:  [74.3 kg (163 lb 12.8 oz)] 74.3 kg (163 lb 12.8 oz) (06/30 0426) HEMODYNAMICS: CVP:  [3 mmHg-9 mmHg] 5 mmHg VENTILATOR SETTINGS: Vent Mode:  [-] CPAP;PSV FiO2 (%):  [40 %] 40 % Set Rate:  [32 bmp] 32 bmp Vt Set:  [400 mL] 400 mL PEEP:  [5 cmH20] 5 cmH20 Pressure Support:  [12 cmH20] 12 cmH20 Plateau Pressure:  [18 cmH20-22 cmH20] 21 cmH20 INTAKE / OUTPUT: Intake/Output     06/29 0701 - 06/30 0700 06/30 0701 - 07/01 0700   I.V. (mL/kg) 692.8 (9.3) 24.5 (0.3)   NG/GT 786.5    IV Piggyback 500 50   Total Intake(mL/kg) 1979.3 (26.6) 74.5 (1)   Urine (mL/kg/hr) 3465 (1.9)    Stool 25 (0)    Chest Tube     Total Output 3490 0   Net -1510.7 +74.5  PHYSICAL EXAMINATION: General: Intubated, looks critically ill. Eyes: Anicteric sclerae. Pupils are equal but sluggish.  ENT: ETT in place. Trachea at midline.  Lymph: No cervical, supraclavicular, or axillary lymphadenopathy. Heart: Normal S1, S2. No murmurs, rubs, or gallops appreciated. No bruits, equal pulses. Lungs: Normal excursion, no dullness to percussion. Good air movement bilaterally, without wheezes or crackles.  Abdomen: Abdomen soft, non-tender and not distended, normoactive bowel sounds. No hepatosplenomegaly or masses. Musculoskeletal: No clubbing or synovitis. No LE edema Skin: No rashes or  lesions Neuro: RASS 0 offdiprivan,. Followed commands slowly  LABS:  PULMONARY  Recent Labs Lab 05/23/14 2302 05/23/14 2319 05/24/14 0018 05/24/14 0426 05/24/14 0606  PHART 6.902*  --  7.231* 7.222* 7.342*  PCO2ART 78.3*  --  45.4* 53.7* 37.2  PO2ART 144.0*  --  107.0* 92.0 193.0*  HCO3 15.4*  --  19.1* 21.8 19.4*  TCO2 18 18 21 23  20.5  O2SAT 96.0  --  97.0 94.0 97.7    CBC  Recent Labs Lab 05/23/14 2310 05/23/14 2319 05/25/14 0455 05/26/14 0427  HGB 11.7* 13.3 11.0* 10.2*  HCT 38.2 39.0 32.6* 30.5*  WBC 14.6*  --  12.4* 11.7*  PLT 227  --  192 178    COAGULATION  Recent Labs Lab 05/24/14 0745  INR 0.99    CARDIAC    Recent Labs Lab 05/24/14 0118 05/24/14 0632 05/24/14 1253  TROPONINI <0.30 0.40* 0.42*    Recent Labs Lab 05/24/14 0632 05/25/14 0401  PROBNP 1419.0* 1748.0*     CHEMISTRY  Recent Labs Lab 05/23/14 2310 05/23/14 2319 05/24/14 0117 05/24/14 0235 05/24/14 0632 05/25/14 0400 05/25/14 0401 05/26/14 05/26/14 0427  NA 141 139 139 138  --  146  --  141  --   K 3.9 3.6* 5.8* 4.6  --  5.6*  --  2.9*  --   CL 99 106 100 100  --  105  --  100  --   CO2 15*  --  17* 22  --  20  --  23  --   GLUCOSE 417* 408* 383* 122*  --  178*  --  245*  --   BUN 13 14 15 21   --  18  --  21  --   CREATININE 1.09 1.20* 1.09 0.85  --  1.08  --  1.24*  --   CALCIUM 8.5  --  8.6 9.0  --  7.3*  --  8.0*  --   MG  --   --   --   --  1.6  --  1.5  --  2.3  PHOS  --   --   --   --  2.4  --  2.7  --  3.5   Estimated Creatinine Clearance: 39.3 ml/min (by C-G formula based on Cr of 1.24).   LIVER  Recent Labs Lab 05/23/14 2310 05/24/14 0745 05/25/14 0401  AST 108*  --  33  ALT 70*  --  58*  ALKPHOS 144*  --  82  BILITOT <0.2*  --  0.6  PROT 6.4  --  6.3  ALBUMIN 3.0*  --  2.9*  INR  --  0.99  --      INFECTIOUS  Recent Labs Lab 05/24/14 1500 05/24/14 1556 05/25/14 0400 05/25/14 0401 05/26/14 05/26/14 0427  LATICACIDVEN  --  1.4  1.2  --  1.4  --   PROCALCITON 7.06  --   --  5.44  --  3.38  ENDOCRINE CBG (last 3)   Recent Labs  05/25/14 2330 05/26/14 0358 05/26/14 0735  GLUCAP 219* 205* 194*         IMAGING x48h  Ct Head Wo Contrast  05/25/2014   CLINICAL DATA:  Altered mental status  EXAM: CT HEAD WITHOUT CONTRAST  TECHNIQUE: Contiguous axial images were obtained from the base of the skull through the vertex without intravenous contrast.  COMPARISON:  None.  FINDINGS: There is no evidence of mass effect, midline shift, or extra-axial fluid collections. There is no evidence of a space-occupying lesion or intracranial hemorrhage. There is no evidence of a cortical-based area of acute infarction. There is generalized cerebral atrophy. There is periventricular white matter low attenuation likely secondary to microangiopathy.  The ventricles and sulci are appropriate for the patient's age. The basal cisterns are patent.  Visualized portions of the orbits are unremarkable. The visualized portions of the paranasal sinuses and mastoid air cells are unremarkable.  The osseous structures are unremarkable.  IMPRESSION: No acute intracranial pathology.   Electronically Signed   By: Elige KoHetal  Patel   On: 05/25/2014 20:43   Dg Chest Port 1 View  05/26/2014   CLINICAL DATA:  Check endotracheal tube.  Shortness of breath  EXAM: PORTABLE CHEST - 1 VIEW  COMPARISON:  05/25/2014  FINDINGS: Endotracheal tube ends at the level of the clavicular heads. There is a left subclavian central line, tip at the level of the upper SVC. The orogastric tube reaches the stomach at least. There is a small bore left-sided chest tube overlapping the base. Mild hypoaeration. No consolidation, edema, effusion, or pneumothorax.  IMPRESSION: 1. Stable positioning of tubes and central line. 2. No residual pneumothorax. 3. Resolved pulmonary edema.   Electronically Signed   By: Tiburcio PeaJonathan  Watts M.D.   On: 05/26/2014 07:02   Dg Chest Port 1  View  05/25/2014   CLINICAL DATA:  Assess endotracheal tube.  EXAM: PORTABLE CHEST - 1 VIEW  COMPARISON:  05/24/2014.  FINDINGS: The endotracheal tube is stable. It is 3 cm above the carina. The external pacer paddles are again demonstrated. A left-sided drainage catheter is unchanged. The lungs show improved aeration with resolving pulmonary edema. No pleural effusion or pneumothorax.  IMPRESSION: The endotracheal tube is 3 cm above the carina.  Stable support apparatus.  Improved lung aeration with resolving perihilar airspace process, likely pulmonary edema.   Electronically Signed   By: Loralie ChampagneMark  Gallerani M.D.   On: 05/25/2014 07:55       ASSESSMENT / PLAN:  PULMONARY A: 1) Acute respiratory failure secondary to cardiac arrest. Likely primary cardiac event.  2) Aspiration event   - on 40% fio2 ,doing sbt  P:   - Mechanical ventilation;   - PRVC, Vt: 6cc/kg, PEEP: 5, RR: 30, FiO2: 100% and adjust to keep O2 sat > 92%   - VAP prevention order set   - Daily awakening and SBT when meets criteria - Albuterol PRN   CARDIOVASCULAR A:  1) Witnessed cardiac arrest. No evidence of STEMI on EKG. Hx of NICM.  - ECHO 05/24/14: LVEF 45% and diffuse reduction   - mild trop bump.  On Iv Heparin per cards. For cath 05/26/14. Lasix held due to bump in creat but still normal range P:  - --  IV heparin gtt per cards - hold lasix per cards  RENAL A:   1) Acute renal failure likely secondary to shock / arrest  - improved but  Again slight bump and lasix  held  P:   - monitor creat with lasix hold  - Will follow BMP ; check mag and phos - Electrolyte replenishment  as indicated.  GASTROINTESTINAL A:   1) GERD P:   - IV protonix - start tube feeds 05/25/14  HEMATOLOGIC A:   1) No issues P:  - Will follow CBC  INFECTIOUS A:   1) Aspirated at admit in ER 05/23/2014  - improving fevers after change to zosyn. PCT profile suggests localized infection as in pneumonia P:   zosyn    ENDOCRINE A:   1) DM P:   - Novolog sliding scale per ICU protocol  NEUROLOGIC A:   1) Post cardiac arrest. At admission: Opening eyes on voice command and moving all 4 extremities after 10 minutes off propofol.     - Following some command and improved 05/26/14  P:   - Sedation with propofol and fentanyl prn   TODAY'S SUMMARY:  Discussed with 3 daughters - one of them is cardiac nurse. Describe patient as stubborn, independent and already planned her funeral. They report she will be angry that they got her intubated and had cpr done on her. So, moving forward No CPR, NO DEFIB. Continue current medical care for fe to several days to see if mental status improves and she can be extubated. Definite NO TRACH, NO LTACH     The patient is critically ill with multiple organ systems failure and requires high complexity decision making for assessment and support, frequent evaluation and titration of therapies, application of advanced monitoring technologies and extensive interpretation of multiple databases.   Critical Care Time devoted to patient care services described in this note is  35  Minutes.  Dr. Kalman Shan, M.D., [facility].C.P Pulmonary and Critical Care Medicine Staff Physician [facility] [place] [facility] Pager: [phone], If no answer or between  15:00h - 7:00h: call [phone]  05/26/2014 8:57 AM

## 2014-05-27 ENCOUNTER — Inpatient Hospital Stay (HOSPITAL_COMMUNITY): Payer: Medicare Other

## 2014-05-27 DIAGNOSIS — R570 Cardiogenic shock: Secondary | ICD-10-CM | POA: Diagnosis not present

## 2014-05-27 DIAGNOSIS — J9600 Acute respiratory failure, unspecified whether with hypoxia or hypercapnia: Secondary | ICD-10-CM | POA: Diagnosis not present

## 2014-05-27 DIAGNOSIS — I4901 Ventricular fibrillation: Secondary | ICD-10-CM | POA: Diagnosis not present

## 2014-05-27 DIAGNOSIS — I214 Non-ST elevation (NSTEMI) myocardial infarction: Secondary | ICD-10-CM | POA: Diagnosis not present

## 2014-05-27 LAB — CBC WITH DIFFERENTIAL/PLATELET
BASOS ABS: 0 10*3/uL (ref 0.0–0.1)
Basophils Relative: 0 % (ref 0–1)
EOS ABS: 0.2 10*3/uL (ref 0.0–0.7)
EOS PCT: 2 % (ref 0–5)
HCT: 27.6 % — ABNORMAL LOW (ref 36.0–46.0)
Hemoglobin: 9.2 g/dL — ABNORMAL LOW (ref 12.0–15.0)
LYMPHS PCT: 15 % (ref 12–46)
Lymphs Abs: 1.5 10*3/uL (ref 0.7–4.0)
MCH: 30.5 pg (ref 26.0–34.0)
MCHC: 33.3 g/dL (ref 30.0–36.0)
MCV: 91.4 fL (ref 78.0–100.0)
Monocytes Absolute: 0.8 10*3/uL (ref 0.1–1.0)
Monocytes Relative: 8 % (ref 3–12)
Neutro Abs: 7.7 10*3/uL (ref 1.7–7.7)
Neutrophils Relative %: 75 % (ref 43–77)
Platelets: 187 10*3/uL (ref 150–400)
RBC: 3.02 MIL/uL — ABNORMAL LOW (ref 3.87–5.11)
RDW: 14 % (ref 11.5–15.5)
WBC: 10.2 10*3/uL (ref 4.0–10.5)

## 2014-05-27 LAB — HEPARIN LEVEL (UNFRACTIONATED): Heparin Unfractionated: 0.12 IU/mL — ABNORMAL LOW (ref 0.30–0.70)

## 2014-05-27 LAB — GLUCOSE, CAPILLARY
GLUCOSE-CAPILLARY: 183 mg/dL — AB (ref 70–99)
Glucose-Capillary: 210 mg/dL — ABNORMAL HIGH (ref 70–99)
Glucose-Capillary: 244 mg/dL — ABNORMAL HIGH (ref 70–99)
Glucose-Capillary: 116 mg/dL — ABNORMAL HIGH (ref 70–99)
Glucose-Capillary: 175 mg/dL — ABNORMAL HIGH (ref 70–99)

## 2014-05-27 LAB — BASIC METABOLIC PANEL
BUN: 22 mg/dL (ref 6–23)
CO2: 23 meq/L (ref 19–32)
Calcium: 8.7 mg/dL (ref 8.4–10.5)
Chloride: 101 mEq/L (ref 96–112)
Creatinine, Ser: 1.14 mg/dL — ABNORMAL HIGH (ref 0.50–1.10)
GFR calc Af Amer: 54 mL/min — ABNORMAL LOW (ref >90)
GFR calc non Af Amer: 46 mL/min — ABNORMAL LOW (ref >90)
Glucose, Bld: 248 mg/dL — ABNORMAL HIGH (ref 70–99)
POTASSIUM: 2.9 meq/L — AB (ref 3.7–5.3)
SODIUM: 142 meq/L (ref 137–147)

## 2014-05-27 LAB — CULTURE, RESPIRATORY

## 2014-05-27 LAB — PHOSPHORUS: PHOSPHORUS: 3 mg/dL (ref 2.3–4.6)

## 2014-05-27 LAB — MAGNESIUM: MAGNESIUM: 2 mg/dL (ref 1.5–2.5)

## 2014-05-27 LAB — CULTURE, RESPIRATORY W GRAM STAIN

## 2014-05-27 MED ORDER — AMLODIPINE BESYLATE 5 MG PO TABS
5.0000 mg | ORAL_TABLET | Freq: Every day | ORAL | Status: DC
  Administered 2014-05-27: 5 mg via ORAL
  Filled 2014-05-27: qty 1

## 2014-05-27 MED ORDER — METOPROLOL TARTRATE 1 MG/ML IV SOLN
5.0000 mg | INTRAVENOUS | Status: DC
  Administered 2014-05-27: 5 mg via INTRAVENOUS
  Filled 2014-05-27: qty 5

## 2014-05-27 MED ORDER — LISINOPRIL 5 MG PO TABS
5.0000 mg | ORAL_TABLET | Freq: Every day | ORAL | Status: DC
  Administered 2014-05-27 – 2014-06-01 (×6): 5 mg via ORAL
  Filled 2014-05-27 (×7): qty 1

## 2014-05-27 MED ORDER — POTASSIUM CHLORIDE 10 MEQ/50ML IV SOLN
10.0000 meq | INTRAVENOUS | Status: AC
  Administered 2014-05-27 (×6): 10 meq via INTRAVENOUS
  Filled 2014-05-27 (×6): qty 50

## 2014-05-27 MED ORDER — NITROGLYCERIN 0.4 MG/HR TD PT24
0.4000 mg | MEDICATED_PATCH | Freq: Every day | TRANSDERMAL | Status: DC
  Administered 2014-05-27 – 2014-05-28 (×2): 0.4 mg via TRANSDERMAL
  Filled 2014-05-27 (×3): qty 1

## 2014-05-27 MED ORDER — HEPARIN SODIUM (PORCINE) 5000 UNIT/ML IJ SOLN
5000.0000 [IU] | Freq: Three times a day (TID) | INTRAMUSCULAR | Status: DC
Start: 2014-05-27 — End: 2014-06-01
  Administered 2014-05-27 – 2014-06-01 (×16): 5000 [IU] via SUBCUTANEOUS
  Filled 2014-05-27 (×18): qty 1

## 2014-05-27 MED ORDER — AMLODIPINE BESYLATE 5 MG PO TABS
5.0000 mg | ORAL_TABLET | Freq: Two times a day (BID) | ORAL | Status: DC
  Administered 2014-05-27 – 2014-05-28 (×3): 5 mg via ORAL
  Filled 2014-05-27 (×5): qty 1

## 2014-05-27 MED ORDER — LABETALOL HCL 5 MG/ML IV SOLN
10.0000 mg | INTRAVENOUS | Status: DC | PRN
  Administered 2014-05-27 – 2014-05-30 (×4): 20 mg via INTRAVENOUS
  Filled 2014-05-27 (×4): qty 4

## 2014-05-27 MED ORDER — ALPRAZOLAM 0.25 MG PO TABS
0.2500 mg | ORAL_TABLET | Freq: Two times a day (BID) | ORAL | Status: DC
Start: 2014-05-27 — End: 2014-05-30
  Administered 2014-05-27 – 2014-05-30 (×6): 0.25 mg via ORAL
  Filled 2014-05-27 (×6): qty 1

## 2014-05-27 MED ORDER — HYDRALAZINE HCL 25 MG PO TABS
25.0000 mg | ORAL_TABLET | Freq: Three times a day (TID) | ORAL | Status: DC
Start: 2014-05-27 — End: 2014-05-29
  Administered 2014-05-27 – 2014-05-28 (×6): 25 mg via ORAL
  Filled 2014-05-27 (×9): qty 1

## 2014-05-27 NOTE — Progress Notes (Signed)
eLink Physician-Brief Progress Note Patient Name: Amanda Stewart DOB: 12-21-1940 MRN: [id number]  Date of Service  05/27/2014   HPI/Events of Note   Pt extubated earlier today.  Passed bedside RN swallow assessment.   eICU Interventions   Will start clear liquid diet.   Intervention Category Major Interventions: Other:  Gricel Copen 05/27/2014, 6:08 PM

## 2014-05-27 NOTE — Progress Notes (Signed)
Inpatient Diabetes Program Recommendations  AACE/ADA: New Consensus Statement on Inpatient Glycemic Control (2013)  Target Ranges:  Prepandial:   less than 140 mg/dL      Peak postprandial:   less than 180 mg/dL (1-2 hours)      Critically ill patients:  140 - 180 mg/dL  Results for Rinks, Stassi (MRN [id number]) as of 05/27/2014 09:32  Ref. Range 05/26/2014 15:39 05/26/2014 19:50 05/26/2014 23:21 05/27/2014 03:54 05/27/2014 07:35  Glucose-Capillary Latest Range: 70-99 mg/dL 861 (H) 683 (H) 729 (H) 210 (H) 244 (H)   Consider using the ICU Hyperglycemia Protocol. Thank you  Piedad Climes BSN, RN,CDE Inpatient Diabetes Coordinator [phone] (team pager)

## 2014-05-27 NOTE — Progress Notes (Signed)
ANTICOAGULATION CONSULT NOTE  Pharmacy Consult for Heparin Indication: chest pain/ACS  Allergies  Allergen Reactions  . 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    Patient Measurements: Height: 5\' 4"  (162.6 cm) Weight: 161 lb 6 oz (73.2 kg) IBW/kg (Calculated) : 54.7 Heparin Dosing Weight: 70kg  Vital Signs: Temp: 100.6 F (38.1 C) (07/01 0400) Temp src: Core (Comment) (07/01 0400) BP: 188/77 mmHg (07/01 0411) Pulse Rate: 88 (07/01 0411)  Labs:  Recent Labs  05/24/14 3094 05/24/14 0745 05/24/14 1253  05/25/14 0400  05/25/14 0455  05/25/14 1942 05/26/14 05/26/14 0427 05/26/14 0930 05/27/14 0330  HGB  --   --   --   --   --   < > 11.0*  --   --   --  10.2* 10.9* 9.2*  HCT  --   --   --   --   --   < > 32.6*  --   --   --  30.5* 32.0* 27.6*  PLT  --   --   --   --   --   --  192  --   --   --  178  --  187  LABPROT  --  13.1  --   --   --   --   --   --   --   --   --   --   --   INR  --  0.99  --   --   --   --   --   --   --   --   --   --   --   HEPARINUNFRC  --   --   --   < >  --   < >  --   < > 0.34  --  0.24*  --  0.12*  CREATININE  --   --   --   --  1.08  --   --   --   --  1.24*  --   --  1.14*  CKTOTAL  --   --   --   --   --   --   --   --   --   --  185*  --   --   CKMB  --   --   --   --   --   --   --   --   --   --  <1.0  --   --   TROPONINI 0.40*  --  0.42*  --   --   --   --   --   --   --   --   --   --   < > = values in this interval not displayed.  Estimated Creatinine Clearance: 42.4 ml/min (by C-G formula based on Cr of 1.14).  Assessment: 73 yo female with CAD s/p cath for heparin  Goal of Therapy:  Heparin level 0.3-0.7 units/ml Monitor platelets by anticoagulation protocol: Yes   Plan:  Increase Heparin  1100 units/hr  Harmoni Lucus, Gary Fleet 05/27/2014,4:30 AM

## 2014-05-27 NOTE — Progress Notes (Signed)
Pt extubated at 1430. Bedside swallow performed by RN. Pt was able to drink water and take bites of applesauce. No coughing or choking noted. MD was called and made aware, diet order placed.

## 2014-05-27 NOTE — Progress Notes (Signed)
eLink Physician-Brief Progress Note Patient Name: Amanda Stewart DOB: [birth year] MRN: [id number]  Date of Service  05/27/2014   HPI/Events of Note  Hypertension Hypokalemia   eICU Interventions  K+ repleted PRN labetalol dose increased   Intervention Category Major Interventions: Hypertension - evaluation and management;Electrolyte abnormality - evaluation and management  Billy Fischer 05/27/2014, 4:48 AM

## 2014-05-27 NOTE — Progress Notes (Signed)
PULMONARY / CRITICAL CARE MEDICINE   Name: Amanda Stewart MRN: [id number] DOB: Feb 07, 1941    ADMISSION DATE:  05/23/2014  PRIMARY SERVICE: PCCM  CHIEF COMPLAINT:  Witnessed cardiac arrest at home  BRIEF PATIENT DESCRIPTION:  73 years old with PMH relevant for CAD, CHF with LVEF of 40%, HTN, DM. Sustained witnessed cardiac arrest requiring 15 minutes of CPR and 4 rounds of epi and 2 shocks before returning to spontaneous circulation. Initial rhythm was V. Fib. Got a King airway and was intubated at arrival to the ED. Patient remained unresponsive. As per her daughter, she is non compliant with her medications. She was doing well 05/23/14  morning and at about 9:30 pm called daughter complaining of severe SOB asking for an ambulance. At arrival of EMS she was in the floor but still with pulse but then became unresponsive and went into V. Fib. Down time of about 15 minutes. At the time of PCCM examination 05/23/14 in ER (note done 05/24/14)  the patient is intubated, opens her eyes on voice command and moves all 4 extremities. There are reports of significant vomit and aspiration of vomit in ER EKG showed normal sinus rhythm with left anterior fascicular block Q-wave in V1 to V3 and ST-T wave changes in lateral leads. No STEMI. Cardiology involved since admission to ED. On arrival to ICU: she was noticed to be following commands open eyes to verbal stimulii and so induced hypothermia not initiated.   LINES / TUBES: - Peripheral IV's - ETT - Foley catheter - left subclavian 6/28  - left chest tube (ptx following line) 6/28 >> - right radial aline 6/28 >>  CULTURES: - Blood cultures sent  ANTIBIOTICS: Unasyn 05/24/14 (aspn)  >>05/25/14 Zosyn 56/29/15 (fevers despite unasyn) >>       SIGNIFICANT EVENTS / STUDIES:  - 05/23/2014:  - 05/24/14: On diprivan: when weaned agitation + and grimace to mouth care. Not on pressors. Not been to cath lab. On 70% fi02. ECHO in progress  - 05/25/14: Daughters x 3  at beside. Agitated without following commands on wua. Hypertensive needing diprivan. Spiking fevers despite unasyn - 05/26/14: Hypertensive. More awake. Nodding and purposeful . Doing SBT. For cath today. FEver improving after abx change   SUBJECTIVE/OVERNIGHT/INTERVAL HX 05/27/14: normal mental status,. Doing SBT. Very hypertrensive despite lasix, nitro gtt and   VITAL SIGNS: Temp:  [99.3 F (37.4 C)-100.6 F (38.1 C)] 100.6 F (38.1 C) (07/01 0400) Pulse Rate:  [79-100] 88 (07/01 0715) Resp:  [17-38] 32 (07/01 0715) BP: (104-200)/(55-88) 115/63 mmHg (07/01 0715) SpO2:  [100 %] 100 % (07/01 0715) Arterial Line BP: (121-257)/(55-105) 140/55 mmHg (07/01 0715) FiO2 (%):  [40 %] 40 % (07/01 0827) Weight:  [73.2 kg (161 lb 6 oz)] 73.2 kg (161 lb 6 oz) (07/01 0410) HEMODYNAMICS: CVP:  [4 mmHg-9 mmHg] 5 mmHg VENTILATOR SETTINGS: Vent Mode:  [-] PSV;CPAP FiO2 (%):  [40 %] 40 % Set Rate:  [32 bmp] 32 bmp Vt Set:  [400 mL] 400 mL PEEP:  [5 cmH20] 5 cmH20 Pressure Support:  [10 cmH20] 10 cmH20 Plateau Pressure:  [17 cmH20-18 cmH20] 18 cmH20 INTAKE / OUTPUT: Intake/Output     06/30 0701 - 07/01 0700 07/01 0701 - 07/02 0700   I.V. (mL/kg) 626.4 (8.6)    Other 50    NG/GT 1022.5    IV Piggyback 300    Total Intake(mL/kg) 1998.9 (27.3)    Urine (mL/kg/hr) 2185 (1.2)    Stool 350 (0.2)    Chest  Tube 20 (0)    Total Output 2555     Net -556.1          Stool Occurrence 2 x      PHYSICAL EXAMINATION: General: Intubated,  Eyes: Anicteric sclerae. Pupils are equal but sluggish.  ENT: ETT in place. Trachea at midline.  Lymph: No cervical, supraclavicular, or axillary lymphadenopathy. Heart: Normal S1, S2. No murmurs, rubs, or gallops appreciated. No bruits, equal pulses. Lungs: Normal excursion, no dullness to percussion. Good air movement bilaterally, without wheezes or crackles.  Abdomen: Abdomen soft, non-tender and not distended, normoactive bowel sounds. No hepatosplenomegaly or  masses. Musculoskeletal: No clubbing or synovitis. No LE edema Skin: No rashes or lesions Neuro: RASS 0 . CAM-ICU negative for delirium. Moves all 4s. Good strength LABS:  PULMONARY  Recent Labs Lab 05/24/14 0018 05/24/14 0426 05/24/14 0606 05/26/14 0930 05/26/14 1032 05/26/14 1045  PHART 7.231* 7.222* 7.342* 7.471*  --  7.580*  PCO2ART 45.4* 53.7* 37.2 37.7  --  31.5*  PO2ART 107.0* 92.0 193.0* 194.0*  --  489.0*  HCO3 19.1* 21.8 19.4* 27.5* 25.9* 29.6*  TCO2 21 23 20.5 29 27 31   O2SAT 97.0 94.0 97.7 100.0 65.0 100.0    CBC  Recent Labs Lab 05/25/14 0455 05/26/14 0427 05/26/14 0930 05/27/14 0330  HGB 11.0* 10.2* 10.9* 9.2*  HCT 32.6* 30.5* 32.0* 27.6*  WBC 12.4* 11.7*  --  10.2  PLT 192 178  --  187    COAGULATION  Recent Labs Lab 05/24/14 0745  INR 0.99    CARDIAC    Recent Labs Lab 05/24/14 0118 05/24/14 0632 05/24/14 1253  TROPONINI <0.30 0.40* 0.42*    Recent Labs Lab 05/24/14 0632 05/25/14 0401  PROBNP 1419.0* 1748.0*     CHEMISTRY  Recent Labs Lab 05/24/14 0117 05/24/14 0235 05/24/14 0632 05/25/14 0400 05/25/14 0401 05/26/14 05/26/14 0427 05/26/14 0930 05/27/14 0330  NA 139 138  --  146  --  141  --  141 142  K 5.8* 4.6  --  5.6*  --  2.9*  --  3.4* 2.9*  CL 100 100  --  105  --  100  --   --  101  CO2 17* 22  --  20  --  23  --   --  23  GLUCOSE 383* 122*  --  178*  --  245*  --   --  248*  BUN 15 21  --  18  --  21  --   --  22  CREATININE 1.09 0.85  --  1.08  --  1.24*  --   --  1.14*  CALCIUM 8.6 9.0  --  7.3*  --  8.0*  --   --  8.7  MG  --   --  1.6  --  1.5  --  2.3  --  2.0  PHOS  --   --  2.4  --  2.7  --  3.5  --  3.0   Estimated Creatinine Clearance: 42.4 ml/min (by C-G formula based on Cr of 1.14).   LIVER  Recent Labs Lab 05/23/14 2310 05/24/14 0745 05/25/14 0401  AST 108*  --  33  ALT 70*  --  58*  ALKPHOS 144*  --  82  BILITOT <0.2*  --  0.6  PROT 6.4  --  6.3  ALBUMIN 3.0*  --  2.9*  INR  --   0.99  --      INFECTIOUS  Recent Labs Lab 05/24/14 1500 05/24/14 1556 05/25/14 0400 05/25/14 0401 05/26/14 05/26/14 0427  LATICACIDVEN  --  1.4 1.2  --  1.4  --   PROCALCITON 7.06  --   --  5.44  --  3.38     ENDOCRINE CBG (last 3)   Recent Labs  05/26/14 2321 05/27/14 0354 05/27/14 0735  GLUCAP 239* 210* 244*         IMAGING x48h  Ct Head Wo Contrast  05/25/2014   CLINICAL DATA:  Altered mental status  EXAM: CT HEAD WITHOUT CONTRAST  TECHNIQUE: Contiguous axial images were obtained from the base of the skull through the vertex without intravenous contrast.  COMPARISON:  None.  FINDINGS: There is no evidence of mass effect, midline shift, or extra-axial fluid collections. There is no evidence of a space-occupying lesion or intracranial hemorrhage. There is no evidence of a cortical-based area of acute infarction. There is generalized cerebral atrophy. There is periventricular white matter low attenuation likely secondary to microangiopathy.  The ventricles and sulci are appropriate for the patient's age. The basal cisterns are patent.  Visualized portions of the orbits are unremarkable. The visualized portions of the paranasal sinuses and mastoid air cells are unremarkable.  The osseous structures are unremarkable.  IMPRESSION: No acute intracranial pathology.   Electronically Signed   By: Elige Ko   On: 05/25/2014 20:43   Dg Chest Port 1 View  05/27/2014   CLINICAL DATA:  Check endotracheal tube  EXAM: PORTABLE CHEST - 1 VIEW  COMPARISON:  05/26/2014  FINDINGS: Endotracheal tube ends in the lower thoracic trachea, at least 4 cm above the carina. A left subclavian central line is in stable position, tip at the mid SVC. An orogastric tube crosses the diaphragm. Left chest tube is in similar position.  Normal heart size and mediastinal contours. Basilar lung aeration is improved. There is no consolidation or edema. No residual pneumothorax.  IMPRESSION: 1. Tubes and central  line remain in good position. 2. Normal lung aeration.  No residual left pneumothorax.   Electronically Signed   By: Tiburcio Pea M.D.   On: 05/27/2014 05:29   Dg Chest Port 1 View  05/26/2014   CLINICAL DATA:  Check endotracheal tube.  Shortness of breath  EXAM: PORTABLE CHEST - 1 VIEW  COMPARISON:  05/25/2014  FINDINGS: Endotracheal tube ends at the level of the clavicular heads. There is a left subclavian central line, tip at the level of the upper SVC. The orogastric tube reaches the stomach at least. There is a small bore left-sided chest tube overlapping the base. Mild hypoaeration. No consolidation, edema, effusion, or pneumothorax.  IMPRESSION: 1. Stable positioning of tubes and central line. 2. No residual pneumothorax. 3. Resolved pulmonary edema.   Electronically Signed   By: Tiburcio Pea M.D.   On: 05/26/2014 07:02       ASSESSMENT / PLAN:  PULMONARY A: 1) Acute respiratory failure secondary to cardiac arrest. Likely primary cardiac event.  2) Aspiration event   - on 40% fio2 ,started SBT P:   - extubate if does well on SBT - Mechanical ventilation;  If fails sBT   - VAP prevention order set   - Albuterol PRN   CARDIOVASCULAR A:  1) Witnessed cardiac arrest. No evidence of STEMI on EKG. Hx of NICM.  - ECHO 05/24/14: LVEF 45% and diffuse reduction   - mild trop bump.  Cath; normal coronaries 05/26/14. Very hypertensive P:  - --lasix, nitro gtt and loperssor  per cards; ccm will increase lopressor by adding IV   RENAL A:   1) Acute renal failure likely secondary to shock / arrest   -   improvedafter lasix held but due to high bp lasix restarted P:   - monitor creat with lasix hold  - Will follow BMP ; check mag and phos - Electrolyte replenishment  as indicated.  GASTROINTESTINAL A:   1) GERD P:   - IV protonix - hold tube feeds for possible extubation  HEMATOLOGIC A:   1) No issues P:  - Will follow CBC  INFECTIOUS A:   1) Aspirated at admit in  ER 05/23/2014  - improving fevers after change to zosyn. PCT profile suggests localized infection as in pneumonia P:   zosyn   ENDOCRINE A:   1) DM P:   - Novolog sliding scale per ICU protocol  NEUROLOGIC A:   1) Post cardiac arrest. At admission: Opening eyes on voice command and moving all 4 extremities after 10 minutes off propofol.     -normal mental status 7./1/15  P:   -dc propofol  - do  fentanyl prn  GLOBAL 6/29/1%: Discussed with 3 daughters - one of them is cardiac nurse. Describe patient as stubborn, independent and already planned her funeral. They report she will be angry that they got her intubated and had cpr done on her. So, moving forward No CPR, NO DEFIB. Continue current medical care for fe to several days to see if mental status improves and she can be extubated. Definite NO TRACH, NO LTACH   05/27/14: y esterday one  daugther changed code status to full. PCCM MD explained that DNR does not mean do  Not rx and goal is to get her better and that DNR means no cpr. She said she will confer with rest of family. Till then full code   The patient is critically ill with multiple organ systems failure and requires high complexity decision making for assessment and support, frequent evaluation and titration of therapies, application of advanced monitoring technologies and extensive interpretation of multiple databases.   Critical Care Time devoted to patient care services described in this note is  35  Minutes.  Dr. Kalman Shan, M.D., [facility].C.P Pulmonary and Critical Care Medicine Staff Physician [facility] [place] [facility] Pager: [phone], If no answer or between  15:00h - 7:00h: call [phone]  05/27/2014 8:43 AM

## 2014-05-27 NOTE — Progress Notes (Signed)
Gave VTO order to extubate. Walk rounds she is doing well.  Dr. Kalman Shan, M.D., [facility].C.P Pulmonary and Critical Care Medicine Staff Physician [place]  Pulmonary and Critical Care Pager: [phone], If no answer or between  15:00h - 7:00h: call [phone]  05/27/2014 3:50 PM

## 2014-05-27 NOTE — Progress Notes (Signed)
Extubation criteria obtained:Pt. able to lift/hold head off bed on command, NIF-20cmh20, FVC-1L, (+) cuff leak, small amount tan/white sputum being sx.'d, RN made aware, to call CCM with parameters.

## 2014-05-27 NOTE — Procedures (Signed)
Extubation Procedure Note  Patient Details:   Name: Amanda Stewart DOB: November 22, 1941 MRN: [id number]   Airway Documentation:     Evaluation  O2 sats: stable throughout Complications: No apparent complications Patient did tolerate procedure well. Bilateral Breath Sounds: Clear;Diminished;Rhonchi (rhonchi cleared some with sx.'ing) Suctioning: Airway Yes   Amanda Stewart 05/27/2014, 2:29 PM

## 2014-05-27 NOTE — Progress Notes (Signed)
Ref: Amanda RodriguezKADAKIA,Hollis Tuller S, MD   Subjective:  Possible extubation today. Down to 40 % FiO2. T max 100.6 F Monitor SR with PACs and LBBB.  Objective:  Vital Signs in the last 24 hours: Temp:  [99.3 F (37.4 C)-100.6 F (38.1 C)] 100.6 F (38.1 C) (07/01 0400) Pulse Rate:  [79-100] 89 (07/01 0901) Cardiac Rhythm:  [-] Normal sinus rhythm (07/01 0800) Resp:  [17-38] 25 (07/01 0900) BP: (104-200)/(55-88) 125/55 mmHg (07/01 0901) SpO2:  [100 %] 100 % (07/01 0900) Arterial Line BP: (117-257)/(55-105) 117/70 mmHg (07/01 0900) FiO2 (%):  [40 %] 40 % (07/01 0900) Weight:  [73.2 kg (161 lb 6 oz)] 73.2 kg (161 lb 6 oz) (07/01 0410)  Physical Exam: BP Readings from Last 1 Encounters:  05/27/14 125/55    Wt Readings from Last 1 Encounters:  05/27/14 73.2 kg (161 lb 6 oz)    Weight change: -1.1 kg (-2 lb 6.8 oz)  HEENT: [place]/AT, Eyes-Brown, PERL, EOMI, Conjunctiva-Pale pink, Sclera-Non-icteric Neck: No JVD, No bruit, Trachea midline. Lungs:  Clearing, Bilateral. Cardiac:  Regular rhythm, normal S1 and S2, no S3. II/VI systolic murmur. Abdomen:  Soft, non-tender. + bowel sounds. Extremities:  No edema present. No cyanosis. No clubbing. CNS: AxOx2, Cranial nerves grossly intact, moves all 4 extremities. Right handed. Skin: Warm and dry.   Intake/Output from previous day: 06/30 0701 - 07/01 0700 In: 1998.9 [I.V.:626.4; NG/GT:1022.5; IV Piggyback:300] Out: 2555 [Urine:2185; Stool:350; Chest Tube:20]    Lab Results: BMET    Component Value Date/Time   NA 142 05/27/2014 0330   NA 141 05/26/2014 0930   NA 141 05/26/2014 0000   K 2.9* 05/27/2014 0330   K 3.4* 05/26/2014 0930   K 2.9* 05/26/2014 0000   CL 101 05/27/2014 0330   CL 100 05/26/2014 0000   CL 105 05/25/2014 0400   CO2 23 05/27/2014 0330   CO2 23 05/26/2014 0000   CO2 20 05/25/2014 0400   GLUCOSE 248* 05/27/2014 0330   GLUCOSE 245* 05/26/2014 0000   GLUCOSE 178* 05/25/2014 0400   BUN 22 05/27/2014 0330   BUN 21 05/26/2014 0000   BUN 18 05/25/2014  0400   CREATININE 1.14* 05/27/2014 0330   CREATININE 1.24* 05/26/2014 0000   CREATININE 1.08 05/25/2014 0400   CALCIUM 8.7 05/27/2014 0330   CALCIUM 8.0* 05/26/2014 0000   CALCIUM 7.3* 05/25/2014 0400   GFRNONAA 46* 05/27/2014 0330   GFRNONAA 42* 05/26/2014 0000   GFRNONAA 49* 05/25/2014 0400   GFRAA 54* 05/27/2014 0330   GFRAA 48* 05/26/2014 0000   GFRAA 57* 05/25/2014 0400   CBC    Component Value Date/Time   WBC 10.2 05/27/2014 0330   RBC 3.02* 05/27/2014 0330   HGB 9.2* 05/27/2014 0330   HCT 27.6* 05/27/2014 0330   PLT 187 05/27/2014 0330   MCV 91.4 05/27/2014 0330   MCH 30.5 05/27/2014 0330   MCHC 33.3 05/27/2014 0330   RDW 14.0 05/27/2014 0330   LYMPHSABS 1.5 05/27/2014 0330   MONOABS 0.8 05/27/2014 0330   EOSABS 0.2 05/27/2014 0330   BASOSABS 0.0 05/27/2014 0330   HEPATIC Function Panel  Recent Labs  05/23/14 2310 05/25/14 0401  PROT 6.4 6.3   HEMOGLOBIN A1C No components found with this basename: HGA1C,  MPG   CARDIAC ENZYMES Lab Results  Component Value Date   CKTOTAL 185* 05/26/2014   CKMB <1.0 05/26/2014   TROPONINI 0.42* 05/24/2014   TROPONINI 0.40* 05/24/2014   TROPONINI <0.30 05/24/2014   BNP  Recent Labs  05/24/14 0632 05/25/14 0401  PROBNP 1419.0* 1748.0*   TSH No results found for this basename: TSH,  in the last 8760 hours CHOLESTEROL No results found for this basename: CHOL,  in the last 8760 hours  Scheduled Meds: . amLODipine  5 mg Oral Daily  . antiseptic oral rinse  15 mL Mouth Rinse QID  . aspirin  81 mg Oral Daily  . chlorhexidine  15 mL Mouth Rinse BID  . feeding supplement (PRO-STAT SUGAR FREE 64)  30 mL Per Tube QID  . feeding supplement (VITAL 1.5 CAL)  1,000 mL Per Tube Q24H  . furosemide  40 mg Intravenous BID  . heparin  5,000 Units Subcutaneous 3 times per day  . hydrALAZINE  25 mg Oral TID  . insulin aspart  0-15 Units Subcutaneous 6 times per day  . insulin glargine  10 Units Subcutaneous QHS  . lisinopril  5 mg Oral Daily  . metoprolol  5 mg  Intravenous Q3H  . metoprolol tartrate  50 mg Oral BID  . multivitamin  5 mL Per Tube Daily  . nitroGLYCERIN  0.4 mg Transdermal Daily  . pantoprazole (PROTONIX) IV  40 mg Intravenous Q24H  . piperacillin-tazobactam (ZOSYN)  IV  3.375 g Intravenous 3 times per day  . potassium chloride  10 mEq Intravenous Q1 Hr x 6   Continuous Infusions: . sodium chloride 10 mL/hr at 05/27/14 0718  . nitroGLYCERIN 90 mcg/min (05/27/14 0657)   PRN Meds:.acetaminophen (TYLENOL) oral liquid 160 mg/5 mL, albuterol, fentaNYL, labetalol, ondansetron (ZOFRAN) IV  Assessment/Plan: Status post witnessed cardiac arrest  Acute left heart systolic failure  Minimally elevated troponin I. secondary to CPR/defibrillation/hypertension doubt significant MI  Acute respiratory failure secondary to above/ Aspiration pneumonia Status post left small pneumothorax status post chest tube  Hypertension  Diabetes mellitus, II  One vessel, native vessel, CAD  Add lisinopril, amlodipine, hydralazine and NTG patch to improve systolic hypertension and wean off NTG drip. Increase activity post extubation.   LOS: 4 days    [name]  MD  05/27/2014, 9:31 AM

## 2014-05-28 ENCOUNTER — Inpatient Hospital Stay (HOSPITAL_COMMUNITY): Payer: Medicare Other

## 2014-05-28 ENCOUNTER — Encounter (HOSPITAL_COMMUNITY): Payer: Self-pay | Admitting: *Deleted

## 2014-05-28 LAB — BASIC METABOLIC PANEL
Anion gap: 17 — ABNORMAL HIGH (ref 5–15)
BUN: 20 mg/dL (ref 6–23)
CHLORIDE: 102 meq/L (ref 96–112)
CO2: 27 meq/L (ref 19–32)
CREATININE: 1.34 mg/dL — AB (ref 0.50–1.10)
Calcium: 8.8 mg/dL (ref 8.4–10.5)
GFR calc Af Amer: 44 mL/min — ABNORMAL LOW (ref >90)
GFR calc non Af Amer: 38 mL/min — ABNORMAL LOW (ref >90)
GLUCOSE: 141 mg/dL — AB (ref 70–99)
POTASSIUM: 3.5 meq/L — AB (ref 3.7–5.3)
Sodium: 146 mEq/L (ref 137–147)

## 2014-05-28 LAB — CBC WITH DIFFERENTIAL/PLATELET
Basophils Absolute: 0 10*3/uL (ref 0.0–0.1)
Basophils Relative: 0 % (ref 0–1)
EOS ABS: 0.2 10*3/uL (ref 0.0–0.7)
EOS PCT: 2 % (ref 0–5)
HCT: 28.2 % — ABNORMAL LOW (ref 36.0–46.0)
HEMOGLOBIN: 9.6 g/dL — AB (ref 12.0–15.0)
Lymphocytes Relative: 18 % (ref 12–46)
Lymphs Abs: 1.8 10*3/uL (ref 0.7–4.0)
MCH: 31.9 pg (ref 26.0–34.0)
MCHC: 34 g/dL (ref 30.0–36.0)
MCV: 93.7 fL (ref 78.0–100.0)
Monocytes Absolute: 1.1 10*3/uL — ABNORMAL HIGH (ref 0.1–1.0)
Monocytes Relative: 11 % (ref 3–12)
Neutro Abs: 7.2 10*3/uL (ref 1.7–7.7)
Neutrophils Relative %: 69 % (ref 43–77)
PLATELETS: 201 10*3/uL (ref 150–400)
RBC: 3.01 MIL/uL — ABNORMAL LOW (ref 3.87–5.11)
RDW: 14 % (ref 11.5–15.5)
WBC: 10.4 10*3/uL (ref 4.0–10.5)

## 2014-05-28 LAB — GLUCOSE, CAPILLARY
GLUCOSE-CAPILLARY: 117 mg/dL — AB (ref 70–99)
GLUCOSE-CAPILLARY: 193 mg/dL — AB (ref 70–99)
GLUCOSE-CAPILLARY: 151 mg/dL — AB (ref 70–99)
GLUCOSE-CAPILLARY: 157 mg/dL — AB (ref 70–99)
Glucose-Capillary: 155 mg/dL — ABNORMAL HIGH (ref 70–99)
Glucose-Capillary: 131 mg/dL — ABNORMAL HIGH (ref 70–99)
Glucose-Capillary: 139 mg/dL — ABNORMAL HIGH (ref 70–99)

## 2014-05-28 LAB — MAGNESIUM: Magnesium: 2 mg/dL (ref 1.5–2.5)

## 2014-05-28 LAB — PHOSPHORUS: Phosphorus: 5.8 mg/dL — ABNORMAL HIGH (ref 2.3–4.6)

## 2014-05-28 MED ORDER — POTASSIUM CHLORIDE 10 MEQ/50ML IV SOLN
10.0000 meq | INTRAVENOUS | Status: AC
  Administered 2014-05-28: 10 meq via INTRAVENOUS
  Filled 2014-05-28 (×2): qty 50

## 2014-05-28 MED ORDER — POTASSIUM CHLORIDE ER 10 MEQ PO TBCR
10.0000 meq | EXTENDED_RELEASE_TABLET | Freq: Two times a day (BID) | ORAL | Status: AC
  Administered 2014-05-28 (×2): 10 meq via ORAL
  Filled 2014-05-28 (×3): qty 1

## 2014-05-28 MED ORDER — POTASSIUM CHLORIDE 10 MEQ/50ML IV SOLN
10.0000 meq | Freq: Once | INTRAVENOUS | Status: AC
  Administered 2014-05-28: 10 meq via INTRAVENOUS

## 2014-05-28 MED ORDER — FUROSEMIDE 40 MG PO TABS
40.0000 mg | ORAL_TABLET | Freq: Every day | ORAL | Status: DC
  Administered 2014-05-28: 40 mg via ORAL
  Filled 2014-05-28 (×2): qty 1

## 2014-05-28 NOTE — Progress Notes (Signed)
NUTRITION FOLLOW UP  Intervention:    Diet advancement as tolerated, recommend CHO-modified, heart healthy diet.   If intake is inadequate once diet advanced, recommend Glucerna Shakes between meals, each supplement provides 220 kcal and 10 grams of protein.  Nutrition Dx:   Inadequate oral intake related to poor appetite with acute illness as evidenced by 50% meal completion of clear liquid diet.  Goal:   Intake to meet >90% of estimated nutrition needs.  Monitor:   PO intake, labs, weight trend.  Assessment:   73 years old with PMH relevant for CAD, CHF with LVEF of 40%, HTN, DM. Sustained witnessed cardiac arrest requiring 15 minutes of CPR and 4 rounds of epi and 2 shocks before returning to spontaneous circulation. Initial rhythm was V. Fib. Was intubated at arrival to the ED.  Extubated on 7/1. TF off since extubation. Diet has been advanced to clear liquids. Consumed 50% of supper yesterday.  Height: Ht Readings from Last 1 Encounters:  05/23/14 5\' 4"  (1.626 m)    Weight Status:  Down with negative fluid status Wt Readings from Last 1 Encounters:  05/28/14 159 lb 9.8 oz (72.4 kg)  05/25/14  166 lb 7.2 oz (75.5 kg)   Re-estimated needs:  Kcal: 1600-1800 Protein: 85-100 gm Fluid: 1.6-1.8 L  Skin: WDL  Diet Order: Clear Liquid   Intake/Output Summary (Last 24 hours) at 05/28/14 1305 Last data filed at 05/28/14 0830  Gross per 24 hour  Intake 515.83 ml  Output   1897 ml  Net -1381.17 ml    Last BM: 7/2 (rectal tube)   Labs:   Recent Labs Lab 05/26/14 05/26/14 0427 05/26/14 0930 05/27/14 0330 05/28/14 0430  NA 141  --  141 142 146  K 2.9*  --  3.4* 2.9* 3.5*  CL 100  --   --  101 102  CO2 23  --   --  23 27  BUN 21  --   --  22 20  CREATININE 1.24*  --   --  1.14* 1.34*  CALCIUM 8.0*  --   --  8.7 8.8  MG  --  2.3  --  2.0 2.0  PHOS  --  3.5  --  3.0 5.8*  GLUCOSE 245*  --   --  248* 141*    CBG (last 3)   Recent Labs  05/28/14 0429  05/28/14 0756 05/28/14 1150  GLUCAP 131* 117* 193*    Scheduled Meds: . ALPRAZolam  0.25 mg Oral BID  . amLODipine  5 mg Oral BID  . antiseptic oral rinse  15 mL Mouth Rinse QID  . aspirin  81 mg Oral Daily  . chlorhexidine  15 mL Mouth Rinse BID  . furosemide  40 mg Oral Daily  . heparin  5,000 Units Subcutaneous 3 times per day  . hydrALAZINE  25 mg Oral TID  . insulin aspart  0-15 Units Subcutaneous 6 times per day  . insulin glargine  10 Units Subcutaneous QHS  . lisinopril  5 mg Oral Daily  . metoprolol tartrate  50 mg Oral BID  . multivitamin  5 mL Per Tube Daily  . nitroGLYCERIN  0.4 mg Transdermal Daily  . pantoprazole (PROTONIX) IV  40 mg Intravenous Q24H  . piperacillin-tazobactam (ZOSYN)  IV  3.375 g Intravenous 3 times per day  . potassium chloride  10 mEq Oral BID    Continuous Infusions: . sodium chloride Stopped (05/28/14 0438)    Joaquin Courts, RD, LDN, CNSC  Pager [phone] After Hours Pager [phone]

## 2014-05-28 NOTE — Progress Notes (Signed)
Ref: Amanda Rodriguez, MD   Subjective:  Feeling better. Extubated. T max 100.8 F. Sinus rhythm with PACs.  Objective:  Vital Signs in the last 24 hours: Temp:  [98.5 F (36.9 C)-100.8 F (38.2 C)] 99.6 F (37.6 C) (07/02 0755) Pulse Rate:  [70-109] 77 (07/02 0900) Cardiac Rhythm:  [-] Normal sinus rhythm (07/02 0700) Resp:  [16-28] 19 (07/02 0900) BP: (105-228)/(46-98) 142/71 mmHg (07/02 0900) SpO2:  [98 %-100 %] 100 % (07/02 0900) Arterial Line BP: (133-237)/(47-106) 159/58 mmHg (07/02 0900) FiO2 (%):  [40 %] 40 % (07/01 1400) Weight:  [72.4 kg (159 lb 9.8 oz)] 72.4 kg (159 lb 9.8 oz) (07/02 0400)  Physical Exam: BP Readings from Last 1 Encounters:  05/28/14 142/71    Wt Readings from Last 1 Encounters:  05/28/14 72.4 kg (159 lb 9.8 oz)    Weight change: -0.8 kg (-1 lb 12.2 oz)  HEENT: [place]/AT, Eyes-Brown, PERL, EOMI, Conjunctiva-Pale pink, Sclera-Non-icteric Neck: No JVD, No bruit, Trachea midline. Lungs:  Clear, Bilateral. Cardiac:  Regular rhythm, normal S1 and S2, no S3. III/VI systolic murmur Abdomen:  Soft, non-tender. Extremities:  No edema present. No cyanosis. No clubbing. CNS: AxOx2, Cranial nerves grossly intact, moves all 4 extremities. Right handed. Skin: Warm and dry.   Intake/Output from previous day: 07/01 0701 - 07/02 0700 In: 1191.4 [P.O.:280; I.V.:293.4; NG/GT:268; IV Piggyback:350] Out: 2727 [Urine:2650; Stool:75; Chest Tube:2]    Lab Results: BMET    Component Value Date/Time   NA 146 05/28/2014 0430   NA 142 05/27/2014 0330   NA 141 05/26/2014 0930   K 3.5* 05/28/2014 0430   K 2.9* 05/27/2014 0330   K 3.4* 05/26/2014 0930   CL 102 05/28/2014 0430   CL 101 05/27/2014 0330   CL 100 05/26/2014 0000   CO2 27 05/28/2014 0430   CO2 23 05/27/2014 0330   CO2 23 05/26/2014 0000   GLUCOSE 141* 05/28/2014 0430   GLUCOSE 248* 05/27/2014 0330   GLUCOSE 245* 05/26/2014 0000   BUN 20 05/28/2014 0430   BUN 22 05/27/2014 0330   BUN 21 05/26/2014 0000   CREATININE 1.34* 05/28/2014  0430   CREATININE 1.14* 05/27/2014 0330   CREATININE 1.24* 05/26/2014 0000   CALCIUM 8.8 05/28/2014 0430   CALCIUM 8.7 05/27/2014 0330   CALCIUM 8.0* 05/26/2014 0000   GFRNONAA 38* 05/28/2014 0430   GFRNONAA 46* 05/27/2014 0330   GFRNONAA 42* 05/26/2014 0000   GFRAA 44* 05/28/2014 0430   GFRAA 54* 05/27/2014 0330   GFRAA 48* 05/26/2014 0000   CBC    Component Value Date/Time   WBC 10.4 05/28/2014 0430   RBC 3.01* 05/28/2014 0430   HGB 9.6* 05/28/2014 0430   HCT 28.2* 05/28/2014 0430   PLT 201 05/28/2014 0430   MCV 93.7 05/28/2014 0430   MCH 31.9 05/28/2014 0430   MCHC 34.0 05/28/2014 0430   RDW 14.0 05/28/2014 0430   LYMPHSABS 1.8 05/28/2014 0430   MONOABS 1.1* 05/28/2014 0430   EOSABS 0.2 05/28/2014 0430   BASOSABS 0.0 05/28/2014 0430   HEPATIC Function Panel  Recent Labs  05/23/14 2310 05/25/14 0401  PROT 6.4 6.3   HEMOGLOBIN A1C No components found with this basename: HGA1C,  MPG   CARDIAC ENZYMES Lab Results  Component Value Date   CKTOTAL 185* 05/26/2014   CKMB <1.0 05/26/2014   TROPONINI 0.42* 05/24/2014   TROPONINI 0.40* 05/24/2014   TROPONINI <0.30 05/24/2014   BNP  Recent Labs  05/24/14 0632 05/25/14 0401  PROBNP 1419.0* 1748.0*  TSH No results found for this basename: TSH,  in the last 8760 hours CHOLESTEROL No results found for this basename: CHOL,  in the last 8760 hours  Scheduled Meds: . ALPRAZolam  0.25 mg Oral BID  . amLODipine  5 mg Oral BID  . antiseptic oral rinse  15 mL Mouth Rinse QID  . aspirin  81 mg Oral Daily  . chlorhexidine  15 mL Mouth Rinse BID  . furosemide  40 mg Oral Daily  . heparin  5,000 Units Subcutaneous 3 times per day  . hydrALAZINE  25 mg Oral TID  . insulin aspart  0-15 Units Subcutaneous 6 times per day  . insulin glargine  10 Units Subcutaneous QHS  . lisinopril  5 mg Oral Daily  . metoprolol tartrate  50 mg Oral BID  . multivitamin  5 mL Per Tube Daily  . nitroGLYCERIN  0.4 mg Transdermal Daily  . pantoprazole (PROTONIX) IV  40 mg Intravenous  Q24H  . piperacillin-tazobactam (ZOSYN)  IV  3.375 g Intravenous 3 times per day  . potassium chloride  10 mEq Oral BID  . potassium chloride  10 mEq Intravenous Q1 Hr x 2   Continuous Infusions: . sodium chloride Stopped (05/28/14 0438)   PRN Meds:.acetaminophen (TYLENOL) oral liquid 160 mg/5 mL, albuterol, fentaNYL, labetalol, ondansetron (ZOFRAN) IV  Assessment/Plan: Status post witnessed cardiac arrest  Acute left heart systolic failure  Minimally elevated troponin I. secondary to CPR/defibrillation/hypertension doubt significant MI  Acute respiratory failure secondary to above/  Aspiration pneumonia  Status post left small pneumothorax status post chest tube  Hypertension  Diabetes mellitus, II  One vessel, native vessel, CAD Acute renal insufficiency Hypokalemia  Change IV lasix to PO and qd. Oral KCl supplement Increase activity. Possible removal of chest tube/Foley/Rectal tube.   LOS: 5 days    Amanda CobbAjay Mylia Pondexter  MD  05/28/2014, 9:10 AM

## 2014-05-28 NOTE — Progress Notes (Signed)
PULMONARY / CRITICAL CARE MEDICINE   Name: Amanda Stewart MRN: [id number] DOB: Jul 28, 1941    ADMISSION DATE:  05/23/2014  PRIMARY SERVICE: PCCM  CHIEF COMPLAINT:  Witnessed cardiac arrest at home  BRIEF PATIENT DESCRIPTION:  73 years old with PMH relevant for CAD, CHF with LVEF of 40%, HTN, DM. Sustained witnessed cardiac arrest requiring 15 minutes of CPR and 4 rounds of epi and 2 shocks before returning to spontaneous circulation. Initial rhythm was V. Fib. Got a King airway and was intubated at arrival to the ED. Patient remained unresponsive. As per her daughter, she is non compliant with her medications. She was doing well 05/23/14  morning and at about 9:30 pm called daughter complaining of severe SOB asking for an ambulance. At arrival of EMS she was in the floor but still with pulse but then became unresponsive and went into V. Fib. Down time of about 15 minutes. At the time of PCCM examination 05/23/14 in ER (note done 05/24/14)  the patient is intubated, opens her eyes on voice command and moves all 4 extremities. There are reports of significant vomit and aspiration of vomit in ER EKG showed normal sinus rhythm with left anterior fascicular block Q-wave in V1 to V3 and ST-T wave changes in lateral leads. No STEMI. Cardiology involved since admission to ED. On arrival to ICU: she was noticed to be following commands open eyes to verbal stimulii and so induced hypothermia not initiated.   LINES / TUBES: - Peripheral IV's - ETT 05/23/2014 >>05/28/14 - Foley catheter - left subclavian 6/28  - left chest tube (ptx following line) 6/28 >> - right radial aline 6/28 >>7/2  CULTURES: -  Results for orders placed during the hospital encounter of 05/23/14  MRSA PCR SCREENING     Status: None   Collection Time    05/24/14  2:05 AM      Result Value Ref Range Status   MRSA by PCR NEGATIVE  NEGATIVE Final   Comment:            The GeneXpert MRSA Assay (FDA     approved for NASAL specimens      only), is one component of a     comprehensive MRSA colonization     surveillance program. It is not     intended to diagnose MRSA     infection nor to guide or     monitor treatment for     MRSA infections.  CULTURE, RESPIRATORY (NON-EXPECTORATED)     Status: None   Collection Time    05/24/14  3:02 PM      Result Value Ref Range Status   Specimen Description TRACHEAL ASPIRATE   Final   Special Requests NONE   Final   Gram Stain     Final   Value: RARE WBC PRESENT, PREDOMINANTLY MONONUCLEAR     RARE SQUAMOUS EPITHELIAL CELLS PRESENT     FEW GRAM POSITIVE COCCI IN PAIRS     Performed at [facility]   Culture     Final   Value: Non-Pathogenic Oropharyngeal-type [place] Isolated.     Performed at [facility]   Report Status 05/27/2014 FINAL   Final  URINE CULTURE     Status: None   Collection Time    05/24/14  3:42 PM      Result Value Ref Range Status   Specimen Description URINE, CATHETERIZED   Final   Special Requests NONE   Final   Culture  Setup  Time     Final   Value: 05/25/2014 00:13     Performed at [facility] Count     Final   Value: NO GROWTH     Performed at [facility]   Culture     Final   Value: NO GROWTH     Performed at [facility]   Report Status 05/26/2014 FINAL   Final  CULTURE, BLOOD (ROUTINE X 2)     Status: None   Collection Time    05/24/14  4:13 PM      Result Value Ref Range Status   Specimen Description BLOOD RIGHT HAND   Final   Special Requests BOTTLES DRAWN AEROBIC ONLY [facility]   Final   Culture  Setup Time     Final   Value: 05/24/2014 22:50     Performed at [facility]   Culture     Final   Value:        BLOOD CULTURE RECEIVED NO GROWTH TO DATE CULTURE WILL BE HELD FOR 5 DAYS BEFORE ISSUING A FINAL NEGATIVE REPORT     Performed at [facility]   Report Status PENDING   Incomplete  CULTURE, BLOOD (ROUTINE X 2)     Status: None   Collection Time    05/24/14  6:35 PM       Result Value Ref Range Status   Specimen Description BLOOD LEFT HAND   Final   Special Requests BOTTLES DRAWN AEROBIC ONLY [facility]   Final   Culture  Setup Time     Final   Value: 05/25/2014 01:49     Performed at [facility]   Culture     Final   Value:        BLOOD CULTURE RECEIVED NO GROWTH TO DATE CULTURE WILL BE HELD FOR 5 DAYS BEFORE ISSUING A FINAL NEGATIVE REPORT     Performed at [facility]   Report Status PENDING   Incomplete    ANTIBIOTICS: Unasyn 05/24/14 (aspn)  >>05/25/14 Zosyn 56/29/15 (fevers despite unasyn) >>       SIGNIFICANT EVENTS / STUDIES:  - 05/23/2014:  - 05/24/14: On diprivan: when weaned agitation + and grimace to mouth care. Not on pressors. Not been to cath lab. On 70% fi02. ECHO in progress  - 05/25/14: Daughters x 3 at beside. Agitated without following commands on wua. Hypertensive needing diprivan. Spiking fevers despite unasyn - 05/26/14: Hypertensive. More awake. Nodding and purposeful . Doing SBT. For cath today. FEver improving after abx change  - 05/27/14: extubated   SUBJECTIVE/OVERNIGHT/INTERVAL HX 7/215: Doing well post extubation. Chest tube still place VITAL SIGNS: Temp:  [98.5 F (36.9 C)-100.8 F (38.2 C)] 99.6 F (37.6 C) (07/02 0755) Pulse Rate:  [70-109] 82 (07/02 1100) Resp:  [16-28] 21 (07/02 1100) BP: (105-228)/(46-98) 112/73 mmHg (07/02 1100) SpO2:  [98 %-100 %] 100 % (07/02 1100) Arterial Line BP: (133-237)/(47-106) 146/53 mmHg (07/02 1100) FiO2 (%):  [40 %] 40 % (07/01 1400) Weight:  [72.4 kg (159 lb 9.8 oz)] 72.4 kg (159 lb 9.8 oz) (07/02 0400) HEMODYNAMICS: CVP:  [2 mmHg-5 mmHg] 2 mmHg VENTILATOR SETTINGS: Vent Mode:  [-] PSV;CPAP FiO2 (%):  [40 %] 40 % Pressure Support:  [12 cmH20] 12 cmH20 Plateau Pressure:  [17 cmH20] 17 cmH20 INTAKE / OUTPUT: Intake/Output     07/01 0701 - 07/02 0700 07/02 0701 - 07/03 0700   P.O. 280    I.V. (mL/kg) 293.4 (  4.1)    Other 0    NG/GT 268    IV Piggyback 350 50    Total Intake(mL/kg) 1191.4 (16.5) 50 (0.7)   Urine (mL/kg/hr) 2650 (1.5)    Stool 75 (0)    Chest Tube 2 (0)    Total Output 2727     Net -1535.6 +50          PHYSICAL EXAMINATION: General: awake watching tv Eyes: Anicteric sclerae. Pupils are equal but sluggish.  ENT: ETT in place. Trachea at midline.  Lymph: No cervical, supraclavicular, or axillary lymphadenopathy. Heart: Normal S1, S2. No murmurs, rubs, or gallops appreciated. No bruits, equal pulses. Lungs: Normal excursion, no dullness to percussion. Good air movement bilaterally, without wheezes or crackles.  Abdomen: Abdomen soft, non-tender and not distended, normoactive bowel sounds. No hepatosplenomegaly or masses. Musculoskeletal: No clubbing or synovitis. No LE edema Skin: No rashes or lesions Neuro: RASS 0 . CAM-ICU negative for delirium. Moves all 4s. Good strength LABS:  PULMONARY  Recent Labs Lab 05/24/14 0018 05/24/14 0426 05/24/14 0606 05/26/14 0930 05/26/14 1032 05/26/14 1045  PHART 7.231* 7.222* 7.342* 7.471*  --  7.580*  PCO2ART 45.4* 53.7* 37.2 37.7  --  31.5*  PO2ART 107.0* 92.0 193.0* 194.0*  --  489.0*  HCO3 19.1* 21.8 19.4* 27.5* 25.9* 29.6*  TCO2 21 23 20.5 29 27 31   O2SAT 97.0 94.0 97.7 100.0 65.0 100.0    CBC  Recent Labs Lab 05/26/14 0427 05/26/14 0930 05/27/14 0330 05/28/14 0430  HGB 10.2* 10.9* 9.2* 9.6*  HCT 30.5* 32.0* 27.6* 28.2*  WBC 11.7*  --  10.2 10.4  PLT 178  --  187 201    COAGULATION  Recent Labs Lab 05/24/14 0745  INR 0.99    CARDIAC    Recent Labs Lab 05/24/14 0118 05/24/14 0632 05/24/14 1253  TROPONINI <0.30 0.40* 0.42*    Recent Labs Lab 05/24/14 0632 05/25/14 0401  PROBNP 1419.0* 1748.0*     CHEMISTRY  Recent Labs Lab 05/24/14 0235 05/24/14 0632 05/25/14 0400 05/25/14 0401 05/26/14 05/26/14 0427 05/26/14 0930 05/27/14 0330 05/28/14 0430  NA 138  --  146  --  141  --  141 142 146  K 4.6  --  5.6*  --  2.9*  --  3.4* 2.9* 3.5*   CL 100  --  105  --  100  --   --  101 102  CO2 22  --  20  --  23  --   --  23 27  GLUCOSE 122*  --  178*  --  245*  --   --  248* 141*  BUN 21  --  18  --  21  --   --  22 20  CREATININE 0.85  --  1.08  --  1.24*  --   --  1.14* 1.34*  CALCIUM 9.0  --  7.3*  --  8.0*  --   --  8.7 8.8  MG  --  1.6  --  1.5  --  2.3  --  2.0 2.0  PHOS  --  2.4  --  2.7  --  3.5  --  3.0 5.8*   Estimated Creatinine Clearance: 35.9 ml/min (by C-G formula based on Cr of 1.34).   LIVER  Recent Labs Lab 05/23/14 2310 05/24/14 0745 05/25/14 0401  AST 108*  --  33  ALT 70*  --  58*  ALKPHOS 144*  --  82  BILITOT <0.2*  --  0.6  PROT 6.4  --  6.3  ALBUMIN 3.0*  --  2.9*  INR  --  0.99  --      INFECTIOUS  Recent Labs Lab 05/24/14 1500 05/24/14 1556 05/25/14 0400 05/25/14 0401 05/26/14 05/26/14 0427  LATICACIDVEN  --  1.4 1.2  --  1.4  --   PROCALCITON 7.06  --   --  5.44  --  3.38     ENDOCRINE CBG (last 3)   Recent Labs  05/27/14 2313 05/28/14 0429 05/28/14 0756  GLUCAP 155* 131* 117*         IMAGING x48h  Dg Chest Port 1 View  05/27/2014   CLINICAL DATA:  Check endotracheal tube  EXAM: PORTABLE CHEST - 1 VIEW  COMPARISON:  05/26/2014  FINDINGS: Endotracheal tube ends in the lower thoracic trachea, at least 4 cm above the carina. A left subclavian central line is in stable position, tip at the mid SVC. An orogastric tube crosses the diaphragm. Left chest tube is in similar position.  Normal heart size and mediastinal contours. Basilar lung aeration is improved. There is no consolidation or edema. No residual pneumothorax.  IMPRESSION: 1. Tubes and central line remain in good position. 2. Normal lung aeration.  No residual left pneumothorax.   Electronically Signed   By: Tiburcio Pea M.D.   On: 05/27/2014 05:29       ASSESSMENT / PLAN:  PULMONARY A: 1) Acute respiratory failure secondary to cardiac arrest. Likely primary cardiac event.  With left ptx ? Post CPR 2)  Aspiration event   - doing well  - P:   - remove chest tube and check cxr 6pm   CARDIOVASCULAR A:  1) Witnessed cardiac arrest. No evidence of STEMI on EKG. Hx of NICM.  - ECHO 05/24/14: LVEF 45% and diffuse reduction   - mild trop bump.  Cath; normal coronaries 05/26/14. Very hypertensive P:  - --meds by cards   RENAL A:   1) Acute renal failure likely secondary to shock / arrest   -   improvedafter lasix held but due to high bp lasix restarted and creat up again P:   - lasix per cards to balance renal and chf issues  GASTROINTESTINAL A:   1) GERD P:   -po  protonix -diet  HEMATOLOGIC A:   1) No issues P:  - Will follow CBC  INFECTIOUS A:   1) Aspirated at admit in ER 05/23/2014  - improving fevers after change to zosyn. PCT profile suggests localized infection as in pneumonia P:   zosyn  X 7 days total =; stop date in place  ENDOCRINE A:   1) DM P:   - Novolog sliding scale per ICU protocol  NEUROLOGIC A:   1) Post cardiac arrest. At admission: Opening eyes on voice command and moving all 4 extremities after 10 minutes off propofol.     -normal mental status 7./1/15 and 05/28/14  P:   -dc propofol  - do  fentanyl prn  GLOBAL 6/29/1%: Discussed with 3 daughters - one of them is cardiac nurse. Describe patient as stubborn, independent and already planned her funeral. They report she will be angry that they got her intubated and had cpr done on her. So, moving forward No CPR, NO DEFIB. Continue current medical care for fe to several days to see if mental status improves and she can be extubated. Definite NO TRACH, NO LTACH   05/27/14: y esterday one  daugther changed code status  to full. PCCM MD explained that DNR does not mean do  Not rx and goal is to get her better and that DNR means no cpr. She said she will confer with rest of family. Till then full code   05/28/14: remove chest tube today. GEt post cxr. Tx service to Dr Algie CofferKadakia. PCCM sign  off     Dr. Kalman ShanMurali Petrina Melby, M.D., [facility].C.P Pulmonary and Critical Care Medicine Staff Physician [facility] Pager: [phone], If no answer or between  15:00h - 7:00h: call [phone]  05/28/2014 11:50 AM

## 2014-05-29 LAB — CBC WITH DIFFERENTIAL/PLATELET
BASOS ABS: 0.1 10*3/uL (ref 0.0–0.1)
Basophils Relative: 0 % (ref 0–1)
EOS PCT: 2 % (ref 0–5)
Eosinophils Absolute: 0.3 10*3/uL (ref 0.0–0.7)
HCT: 30.1 % — ABNORMAL LOW (ref 36.0–46.0)
Hemoglobin: 9.6 g/dL — ABNORMAL LOW (ref 12.0–15.0)
Lymphocytes Relative: 19 % (ref 12–46)
Lymphs Abs: 2.2 10*3/uL (ref 0.7–4.0)
MCH: 30.3 pg (ref 26.0–34.0)
MCHC: 31.9 g/dL (ref 30.0–36.0)
MCV: 95 fL (ref 78.0–100.0)
Monocytes Absolute: 1.1 10*3/uL — ABNORMAL HIGH (ref 0.1–1.0)
Monocytes Relative: 9 % (ref 3–12)
Neutro Abs: 8.2 10*3/uL — ABNORMAL HIGH (ref 1.7–7.7)
Neutrophils Relative %: 70 % (ref 43–77)
PLATELETS: 248 10*3/uL (ref 150–400)
RBC: 3.17 MIL/uL — ABNORMAL LOW (ref 3.87–5.11)
RDW: 13.7 % (ref 11.5–15.5)
WBC: 11.7 10*3/uL — ABNORMAL HIGH (ref 4.0–10.5)

## 2014-05-29 LAB — BASIC METABOLIC PANEL
Anion gap: 13 (ref 5–15)
BUN: 23 mg/dL (ref 6–23)
CALCIUM: 9 mg/dL (ref 8.4–10.5)
CO2: 28 meq/L (ref 19–32)
CREATININE: 1.31 mg/dL — AB (ref 0.50–1.10)
Chloride: 102 mEq/L (ref 96–112)
GFR calc Af Amer: 45 mL/min — ABNORMAL LOW (ref >90)
GFR, EST NON AFRICAN AMERICAN: 39 mL/min — AB (ref >90)
Glucose, Bld: 206 mg/dL — ABNORMAL HIGH (ref 70–99)
Potassium: 3.9 mEq/L (ref 3.7–5.3)
SODIUM: 143 meq/L (ref 137–147)

## 2014-05-29 LAB — IRON AND TIBC
Iron: 39 ug/dL — ABNORMAL LOW (ref 42–135)
Saturation Ratios: 16 % — ABNORMAL LOW (ref 20–55)
TIBC: 241 ug/dL — AB (ref 250–470)
UIBC: 202 ug/dL (ref 125–400)

## 2014-05-29 LAB — GLUCOSE, CAPILLARY
GLUCOSE-CAPILLARY: 180 mg/dL — AB (ref 70–99)
GLUCOSE-CAPILLARY: 159 mg/dL — AB (ref 70–99)
Glucose-Capillary: 109 mg/dL — ABNORMAL HIGH (ref 70–99)
Glucose-Capillary: 200 mg/dL — ABNORMAL HIGH (ref 70–99)
Glucose-Capillary: 167 mg/dL — ABNORMAL HIGH (ref 70–99)

## 2014-05-29 LAB — FERRITIN: FERRITIN: 546 ng/mL — AB (ref 10–291)

## 2014-05-29 LAB — PHOSPHORUS: Phosphorus: 4.1 mg/dL (ref 2.3–4.6)

## 2014-05-29 LAB — MAGNESIUM: MAGNESIUM: 2.1 mg/dL (ref 1.5–2.5)

## 2014-05-29 MED ORDER — CARVEDILOL 3.125 MG PO TABS
3.1250 mg | ORAL_TABLET | Freq: Two times a day (BID) | ORAL | Status: DC
  Administered 2014-05-29: 3.125 mg via ORAL
  Filled 2014-05-29 (×4): qty 1

## 2014-05-29 MED ORDER — AMLODIPINE BESYLATE 5 MG PO TABS
5.0000 mg | ORAL_TABLET | Freq: Every day | ORAL | Status: DC
  Administered 2014-05-29: 5 mg via ORAL
  Filled 2014-05-29 (×2): qty 1

## 2014-05-29 MED ORDER — INSULIN ASPART 100 UNIT/ML ~~LOC~~ SOLN
0.0000 [IU] | Freq: Three times a day (TID) | SUBCUTANEOUS | Status: DC
  Administered 2014-05-29 (×2): 3 [IU] via SUBCUTANEOUS
  Administered 2014-05-30: 5 [IU] via SUBCUTANEOUS
  Administered 2014-05-30 (×2): 3 [IU] via SUBCUTANEOUS
  Administered 2014-05-31 (×3): 2 [IU] via SUBCUTANEOUS
  Administered 2014-06-01: 3 [IU] via SUBCUTANEOUS

## 2014-05-29 MED ORDER — ISOSORBIDE MONONITRATE 15 MG HALF TABLET
15.0000 mg | ORAL_TABLET | Freq: Every day | ORAL | Status: DC
Start: 2014-05-29 — End: 2014-05-30
  Administered 2014-05-29: 15 mg via ORAL
  Filled 2014-05-29: qty 1

## 2014-05-29 MED ORDER — PANTOPRAZOLE SODIUM 40 MG PO TBEC
40.0000 mg | DELAYED_RELEASE_TABLET | Freq: Every day | ORAL | Status: DC
  Administered 2014-05-30 – 2014-06-01 (×3): 40 mg via ORAL
  Filled 2014-05-29 (×3): qty 1

## 2014-05-29 NOTE — Progress Notes (Signed)
Ref: Ricki RodriguezKADAKIA,Durene Dodge S, MD   Subjective:  Wants to get out of bed. T max 100.2 F  About 40 point difference between systolic by art line and cuff pressure.  Chest tube is out. Awaiting rectal tube removal.  Objective:  Vital Signs in the last 24 hours: Temp:  [99.5 F (37.5 C)-100.2 F (37.9 C)] 99.7 F (37.6 C) (07/03 0300) Pulse Rate:  [70-89] 70 (07/03 0300) Cardiac Rhythm:  [-] Normal sinus rhythm (07/03 0400) Resp:  [13-25] 20 (07/03 0700) BP: (96-200)/(39-116) 96/39 mmHg (07/03 0700) SpO2:  [98 %-100 %] 98 % (07/03 0300) Arterial Line BP: (146-167)/(53-66) 154/66 mmHg (07/02 1300)  Physical Exam: BP Readings from Last 1 Encounters:  05/29/14 96/39    Wt Readings from Last 1 Encounters:  05/28/14 72.4 kg (159 lb 9.8 oz)    Weight change:   HEENT: [place]/AT, Eyes-Brown, PERL, EOMI, Conjunctiva-Pale pink, Sclera-Non-icteric Neck: No JVD, No bruit, Trachea midline. Lungs:  Clear, Bilateral. Cardiac:  Regular rhythm, normal S1 and S2, no S3. III/VI systolic murmur. Abdomen:  Soft, non-tender. Extremities:  No edema present. No cyanosis. No clubbing. CNS: AxOx3, Cranial nerves grossly intact, moves all 4 extremities. Right handed. Skin: Warm and dry.   Intake/Output from previous day: 07/02 0701 - 07/03 0700 In: 690 [I.V.:440; IV Piggyback:250] Out: 1300 [Urine:1300]    Lab Results: BMET    Component Value Date/Time   NA 146 05/28/2014 0430   NA 142 05/27/2014 0330   NA 141 05/26/2014 0930   K 3.5* 05/28/2014 0430   K 2.9* 05/27/2014 0330   K 3.4* 05/26/2014 0930   CL 102 05/28/2014 0430   CL 101 05/27/2014 0330   CL 100 05/26/2014 0000   CO2 27 05/28/2014 0430   CO2 23 05/27/2014 0330   CO2 23 05/26/2014 0000   GLUCOSE 141* 05/28/2014 0430   GLUCOSE 248* 05/27/2014 0330   GLUCOSE 245* 05/26/2014 0000   BUN 20 05/28/2014 0430   BUN 22 05/27/2014 0330   BUN 21 05/26/2014 0000   CREATININE 1.34* 05/28/2014 0430   CREATININE 1.14* 05/27/2014 0330   CREATININE 1.24* 05/26/2014 0000   CALCIUM 8.8  05/28/2014 0430   CALCIUM 8.7 05/27/2014 0330   CALCIUM 8.0* 05/26/2014 0000   GFRNONAA 38* 05/28/2014 0430   GFRNONAA 46* 05/27/2014 0330   GFRNONAA 42* 05/26/2014 0000   GFRAA 44* 05/28/2014 0430   GFRAA 54* 05/27/2014 0330   GFRAA 48* 05/26/2014 0000   CBC    Component Value Date/Time   WBC 11.7* 05/29/2014 0500   RBC 3.17* 05/29/2014 0500   HGB 9.6* 05/29/2014 0500   HCT 30.1* 05/29/2014 0500   PLT 248 05/29/2014 0500   MCV 95.0 05/29/2014 0500   MCH 30.3 05/29/2014 0500   MCHC 31.9 05/29/2014 0500   RDW 13.7 05/29/2014 0500   LYMPHSABS 2.2 05/29/2014 0500   MONOABS 1.1* 05/29/2014 0500   EOSABS 0.3 05/29/2014 0500   BASOSABS 0.1 05/29/2014 0500   HEPATIC Function Panel  Recent Labs  05/23/14 2310 05/25/14 0401  PROT 6.4 6.3   HEMOGLOBIN A1C No components found with this basename: HGA1C,  MPG   CARDIAC ENZYMES Lab Results  Component Value Date   CKTOTAL 185* 05/26/2014   CKMB <1.0 05/26/2014   TROPONINI 0.42* 05/24/2014   TROPONINI 0.40* 05/24/2014   TROPONINI <0.30 05/24/2014   BNP  Recent Labs  05/24/14 0632 05/25/14 0401  PROBNP 1419.0* 1748.0*   TSH No results found for this basename: TSH,  in the last  8760 hours CHOLESTEROL No results found for this basename: CHOL,  in the last 8760 hours  Scheduled Meds: . ALPRAZolam  0.25 mg Oral BID  . antiseptic oral rinse  15 mL Mouth Rinse QID  . aspirin  81 mg Oral Daily  . heparin  5,000 Units Subcutaneous 3 times per day  . insulin aspart  0-15 Units Subcutaneous 6 times per day  . insulin glargine  10 Units Subcutaneous QHS  . lisinopril  5 mg Oral Daily  . multivitamin  5 mL Per Tube Daily  . pantoprazole (PROTONIX) IV  40 mg Intravenous Q24H  . piperacillin-tazobactam (ZOSYN)  IV  3.375 g Intravenous 3 times per day   Continuous Infusions: . sodium chloride 20 mL/hr at 05/28/14 0800   PRN Meds:.acetaminophen (TYLENOL) oral liquid 160 mg/5 mL, albuterol, fentaNYL, labetalol, ondansetron (ZOFRAN) IV  Assessment/Plan: Status post  witnessed cardiac arrest  Acute left heart systolic failure  Minimally elevated troponin I. secondary to CPR/defibrillation/hypertension doubt significant MI  Acute respiratory failure secondary to above/  Aspiration pneumonia  Status post left small pneumothorax status post chest tube  Hypertension  Diabetes mellitus, II  One vessel, native vessel, CAD  Acute renal insufficiency  Hypokalemia  PT/OT evaluation and treatment. Discontinue most antihypertensive for now and reintroduce as needed.   LOS: 6 days    [name]  MD  05/29/2014, 7:47 AM

## 2014-05-29 NOTE — Progress Notes (Signed)
Pt c/o new onset headache. Pt describes HA as sharp dull on the left side of head. RN noted pt holding head in hands with eyes closed, and grimacing. Pt rated pain 10/10. Neuro assessment done. Pupils are equal and reactive. Hand grip equal bilaterally and weak per pt's baseline. No arm drift or facial droop noted. Pt's BP 189/70. Labelalol 20mg  PRN and Fentanyl 100mg  PRN given. Will continue to monitor and assess pt's condition.

## 2014-05-29 NOTE — Progress Notes (Signed)
Dr. Algie Coffer paged about pt's hypertension; waiting new orders. Reported off to night RN; will continue to monitor closely and update as needed.

## 2014-05-30 ENCOUNTER — Inpatient Hospital Stay (HOSPITAL_COMMUNITY): Payer: Medicare Other

## 2014-05-30 LAB — CULTURE, BLOOD (ROUTINE X 2): Culture: NO GROWTH

## 2014-05-30 LAB — GLUCOSE, CAPILLARY
Glucose-Capillary: 206 mg/dL — ABNORMAL HIGH (ref 70–99)
Glucose-Capillary: 170 mg/dL — ABNORMAL HIGH (ref 70–99)
Glucose-Capillary: 175 mg/dL — ABNORMAL HIGH (ref 70–99)
Glucose-Capillary: 130 mg/dL — ABNORMAL HIGH (ref 70–99)

## 2014-05-30 MED ORDER — LORATADINE 10 MG PO TABS
10.0000 mg | ORAL_TABLET | Freq: Every day | ORAL | Status: DC
Start: 2014-05-30 — End: 2014-06-01
  Administered 2014-05-30 – 2014-06-01 (×3): 10 mg via ORAL
  Filled 2014-05-30 (×3): qty 1

## 2014-05-30 MED ORDER — ALPRAZOLAM 0.25 MG PO TABS
0.2500 mg | ORAL_TABLET | Freq: Every day | ORAL | Status: DC
  Administered 2014-06-01: 0.25 mg via ORAL
  Filled 2014-05-30: qty 1

## 2014-05-30 MED ORDER — HYDRALAZINE HCL 25 MG PO TABS
25.0000 mg | ORAL_TABLET | Freq: Four times a day (QID) | ORAL | Status: DC
  Administered 2014-05-30 – 2014-06-01 (×10): 25 mg via ORAL
  Filled 2014-05-30 (×12): qty 1

## 2014-05-30 MED ORDER — OXYCODONE HCL 5 MG PO TABS
5.0000 mg | ORAL_TABLET | ORAL | Status: DC | PRN
  Administered 2014-05-30 – 2014-06-01 (×5): 5 mg via ORAL
  Filled 2014-05-30 (×5): qty 1

## 2014-05-30 MED ORDER — CARVEDILOL 6.25 MG PO TABS
6.2500 mg | ORAL_TABLET | Freq: Two times a day (BID) | ORAL | Status: DC
Start: 2014-05-30 — End: 2014-06-01
  Administered 2014-05-30 – 2014-06-01 (×5): 6.25 mg via ORAL
  Filled 2014-05-30 (×7): qty 1

## 2014-05-30 MED ORDER — AMLODIPINE BESYLATE 5 MG PO TABS
5.0000 mg | ORAL_TABLET | Freq: Two times a day (BID) | ORAL | Status: DC
  Administered 2014-05-30 – 2014-06-01 (×5): 5 mg via ORAL
  Filled 2014-05-30 (×6): qty 1

## 2014-05-30 NOTE — Progress Notes (Signed)
Ref: Amanda Standage S, MD   Subjective:  Severe headache in spite of controlling blood pressure. No significant nausea or vomiting.  Objective:  Vital Signs in the last 24 hours: Temp:  [97.8 F (36.6 C)-99.4 F (37.4 C)] 99.4 F (37.4 C) (07/04 0700) Pulse Rate:  [77-88] 85 (07/04 0700) Cardiac Rhythm:  [-] Normal sinus rhythm;Bundle branch block (07/04 0700) Resp:  [15-24] 20 (07/04 0700) BP: (119-203)/(48-109) 144/57 mmHg (07/04 0700) SpO2:  [92 %-95 %] 95 % (07/04 0700)  Physical Exam: BP Readings from Last 1 Encounters:  05/30/14 144/57    Wt Readings from Last 1 Encounters:  05/28/14 72.4 kg (159 lb 9.8 oz)    Weight change:   HEENT: [place]/AT, Eyes-Brown, PERL, EOMI, Conjunctiva-Pale pink, Sclera-Non-icteric Neck: No JVD, No bruit, Trachea midline. Lungs:  Clear, Bilateral. Cardiac:  Regular rhythm, normal S1 and S2, no S3. III/VI systolic murmur. Abdomen:  Soft, non-tender. Extremities:  No edema present. No cyanosis. No clubbing. CNS: AxOx3, Cranial nerves grossly intact, moves all 4 extremities. Right handed. Skin: Warm and dry.   Intake/Output from previous day: 07/03 0701 - 07/04 0700 In: 210 [I.V.:60; IV Piggyback:150] Out: 250 [Urine:250]    Lab Results: BMET    Component Value Date/Time   NA 143 05/29/2014 0820   NA 146 05/28/2014 0430   NA 142 05/27/2014 0330   K 3.9 05/29/2014 0820   K 3.5* 05/28/2014 0430   K 2.9* 05/27/2014 0330   CL 102 05/29/2014 0820   CL 102 05/28/2014 0430   CL 101 05/27/2014 0330   CO2 28 05/29/2014 0820   CO2 27 05/28/2014 0430   CO2 23 05/27/2014 0330   GLUCOSE 206* 05/29/2014 0820   GLUCOSE 141* 05/28/2014 0430   GLUCOSE 248* 05/27/2014 0330   BUN 23 05/29/2014 0820   BUN 20 05/28/2014 0430   BUN 22 05/27/2014 0330   CREATININE 1.31* 05/29/2014 0820   CREATININE 1.34* 05/28/2014 0430   CREATININE 1.14* 05/27/2014 0330   CALCIUM 9.0 05/29/2014 0820   CALCIUM 8.8 05/28/2014 0430   CALCIUM 8.7 05/27/2014 0330   GFRNONAA 39* 05/29/2014 0820   GFRNONAA 38*  05/28/2014 0430   GFRNONAA 46* 05/27/2014 0330   GFRAA 45* 05/29/2014 0820   GFRAA 44* 05/28/2014 0430   GFRAA 54* 05/27/2014 0330   CBC    Component Value Date/Time   WBC 11.7* 05/29/2014 0500   RBC 3.17* 05/29/2014 0500   HGB 9.6* 05/29/2014 0500   HCT 30.1* 05/29/2014 0500   PLT 248 05/29/2014 0500   MCV 95.0 05/29/2014 0500   MCH 30.3 05/29/2014 0500   MCHC 31.9 05/29/2014 0500   RDW 13.7 05/29/2014 0500   LYMPHSABS 2.2 05/29/2014 0500   MONOABS 1.1* 05/29/2014 0500   EOSABS 0.3 05/29/2014 0500   BASOSABS 0.1 05/29/2014 0500   HEPATIC Function Panel  Recent Labs  05/23/14 2310 05/25/14 0401  PROT 6.4 6.3   HEMOGLOBIN A1C No components found with this basename: HGA1C,  MPG   CARDIAC ENZYMES Lab Results  Component Value Date   CKTOTAL 185* 05/26/2014   CKMB <1.0 05/26/2014   TROPONINI 0.42* 05/24/2014   TROPONINI 0.40* 05/24/2014   TROPONINI <0.30 05/24/2014   BNP  Recent Labs  05/24/14 0632 05/25/14 0401  PROBNP 1419.0* 1748.0*   TSH No results found for this basename: TSH,  in the last 8760 hours CHOLESTEROL No results found for this basename: CHOL,  in the last 8760 hours  Scheduled Meds: . ALPRAZolam  0.25 mg Oral  BID  . amLODipine  5 mg Oral BID  . antiseptic oral rinse  15 mL Mouth Rinse QID  . aspirin  81 mg Oral Daily  . carvedilol  6.25 mg Oral BID WC  . heparin  5,000 Units Subcutaneous 3 times per day  . hydrALAZINE  25 mg Oral QID  . insulin aspart  0-15 Units Subcutaneous TID WC  . insulin glargine  10 Units Subcutaneous QHS  . lisinopril  5 mg Oral Daily  . multivitamin  5 mL Per Tube Daily  . pantoprazole  40 mg Oral Daily  . piperacillin-tazobactam (ZOSYN)  IV  3.375 g Intravenous 3 times per day   Continuous Infusions: . sodium chloride Stopped (05/29/14 1700)   PRN Meds:.acetaminophen (TYLENOL) oral liquid 160 mg/5 mL, albuterol, fentaNYL, labetalol, ondansetron (ZOFRAN) IV, oxyCODONE  Assessment/Plan: Status post witnessed cardiac arrest  Acute left heart  systolic failure  Minimally elevated troponin I. secondary to CPR/defibrillation/hypertension doubt significant MI  Acute respiratory failure secondary to above/  Aspiration pneumonia  Status post left small pneumothorax status post chest tube  Hypertension  Diabetes mellitus, II  One vessel, native vessel, CAD  Acute renal insufficiency  Hypokalemia Severe headache  CT brain.     LOS: 7 days    [name]  MD  05/30/2014, 8:39 AM

## 2014-05-30 NOTE — Progress Notes (Signed)
PT Cancellation Note  Patient Details Name: Amanda Stewart MRN: [id number] DOB: May 26, 1941   Cancelled Treatment:    Reason Eval/Treat Not Completed: Medical issues which prohibited therapy (Pt with HA and just back from stat CT.  Will check back in pm as able.) Thanks.  INGOLD,Jessyka [place] 05/30/2014, 10:14 AM [facility] [phone] (pager)

## 2014-05-30 NOTE — Progress Notes (Signed)
OT Cancellation Note  Patient Details Name: Amanda Stewart MRN: [id number] DOB: [birth year]   Cancelled Treatment:    Reason Eval/Treat Not Completed: Medical issues which prohibited therapy - pt with headache and awaiting STAT CT per RN.  Will reattempt later as schedule allows and pt medically appropriate.  Angelene Giovanni [place], OTR/L [phone]  05/30/2014, 9:26 AM

## 2014-05-30 NOTE — Progress Notes (Signed)
PT Cancellation Note  Patient Details Name: Amanda Stewart MRN: [id number] DOB: May 04, 1941   Cancelled Treatment:    Reason Eval Not Completed: Patient declined, no reason specified  Pt sleeping on arrival, however RN reports she has been asleep at least 4 hours and wanted PT to awaken her. Pt very drowsy, but nodded that she would get OOB. Once her covers were removed, she complained "it's too cold" and resisted all further attempts to persuade her or assist her to sit on EOB. Reviewed today's date and how long pt had been in bed and benefits of activity (along with risks of inactivity). Pt kept her eyes closed and placed her hands over her ears. Pt would not respond to any further questions/requests.   Geza Beranek 05/30/2014, 4:45 PM Pager [phone]

## 2014-05-30 NOTE — Progress Notes (Signed)
Pt now c/o of 10/10 "exploding headache." Neuro exam performed and found to be unchanged from previous assessment. BP 155/109 (118) . PRN labetalol given. Follow up BP 149/51 (87). Dr. Algie Coffer notified. Verbal orders given to discontinue Imdur and to order Oxycodone.

## 2014-05-31 LAB — CULTURE, BLOOD (ROUTINE X 2): CULTURE: NO GROWTH

## 2014-05-31 LAB — CBC
HCT: 31.1 % — ABNORMAL LOW (ref 36.0–46.0)
HEMOGLOBIN: 10.1 g/dL — AB (ref 12.0–15.0)
MCH: 30.1 pg (ref 26.0–34.0)
MCHC: 32.5 g/dL (ref 30.0–36.0)
MCV: 92.8 fL (ref 78.0–100.0)
Platelets: 305 10*3/uL (ref 150–400)
RBC: 3.35 MIL/uL — AB (ref 3.87–5.11)
RDW: 13.1 % (ref 11.5–15.5)
WBC: 8.4 10*3/uL (ref 4.0–10.5)

## 2014-05-31 LAB — IRON AND TIBC
IRON: 52 ug/dL (ref 42–135)
SATURATION RATIOS: 22 % (ref 20–55)
TIBC: 238 ug/dL — ABNORMAL LOW (ref 250–470)
UIBC: 186 ug/dL (ref 125–400)

## 2014-05-31 LAB — BASIC METABOLIC PANEL
ANION GAP: 17 — AB (ref 5–15)
BUN: 18 mg/dL (ref 6–23)
CHLORIDE: 99 meq/L (ref 96–112)
CO2: 24 meq/L (ref 19–32)
CREATININE: 1.14 mg/dL — AB (ref 0.50–1.10)
Calcium: 9.5 mg/dL (ref 8.4–10.5)
GFR calc Af Amer: 54 mL/min — ABNORMAL LOW (ref >90)
GFR calc non Af Amer: 46 mL/min — ABNORMAL LOW (ref >90)
Glucose, Bld: 154 mg/dL — ABNORMAL HIGH (ref 70–99)
Potassium: 3.6 mEq/L — ABNORMAL LOW (ref 3.7–5.3)
SODIUM: 140 meq/L (ref 137–147)

## 2014-05-31 LAB — GLUCOSE, CAPILLARY
GLUCOSE-CAPILLARY: 129 mg/dL — AB (ref 70–99)
Glucose-Capillary: 150 mg/dL — ABNORMAL HIGH (ref 70–99)
Glucose-Capillary: 134 mg/dL — ABNORMAL HIGH (ref 70–99)
Glucose-Capillary: 127 mg/dL — ABNORMAL HIGH (ref 70–99)

## 2014-05-31 MED ORDER — PHENOL 1.4 % MT LIQD
1.0000 | OROMUCOSAL | Status: DC | PRN
  Administered 2014-05-31: 1 via OROMUCOSAL
  Filled 2014-05-31: qty 177

## 2014-05-31 MED ORDER — POTASSIUM CHLORIDE CRYS ER 10 MEQ PO TBCR
10.0000 meq | EXTENDED_RELEASE_TABLET | Freq: Every day | ORAL | Status: DC
  Administered 2014-05-31 – 2014-06-01 (×2): 10 meq via ORAL
  Filled 2014-05-31 (×2): qty 1

## 2014-05-31 NOTE — Progress Notes (Signed)
Occupational Therapy Evaluation Patient Details Name: Amanda Stewart MRN: [id number] DOB: 1941-01-22 Today's Date: 05/31/2014    History of Present Illness 73 years old with PMH relevant for CAD, CHF with LVEF of 40%, HTN, DM. Sustained witnessed cardiac arrest requiring 15 minutes of CPR and 4 rounds of epi and 2 shocks before returning to spontaneous circulation.   Clinical Impression   PTA pt reports that she lived alone and was independent with ADLs and functional mobility. Pt with minimal conversation during therapy session and unsure whether this is behavioral vs. Cognition. No family present to determine baseline. At time of writing this note, PT informed OT that pt's daughters plan to assist at d/c, however they work during the day. Pt would benefit from [facility] to increase independence with ADLs at home. No further acute OT needs.     Follow Up Recommendations  Home health OT;Supervision/Assistance - 24 hour    Equipment Recommendations  None recommended by OT       Precautions / Restrictions Restrictions Weight Bearing Restrictions: No      Mobility Bed Mobility Overal bed mobility: Needs Assistance Bed Mobility: Supine to Sit;Sit to Supine     Supine to sit: Supervision;HOB elevated Sit to supine: Supervision (HOB flat, use of rails)   General bed mobility comments: Pt used rails for bed mobility and moved slowly  Transfers Overall transfer level: Needs assistance Equipment used: 1 person hand held assist Transfers: Sit to/from Stand Sit to Stand: Min assist         General transfer comment: Pt required min A to power up to standing. Pt responded to command to stand up straight, however required assist to maintain static standing balance.          ADL Overall ADL's : Needs assistance/impaired Eating/Feeding: Independent;Sitting   Grooming: Set up;Sitting   Upper Body Bathing: Set up;Sitting   Lower Body Bathing: Minimal assistance;Sit to/from stand   Upper  Body Dressing : Set up;Sitting   Lower Body Dressing: Minimal assistance;Sit to/from stand   Toilet Transfer: Minimal assistance;Stand-pivot (1 person hand held assist)   Toileting- Clothing Manipulation and Hygiene: Minimal assistance;Sit to/from stand       Functional mobility during ADLs:  (did not ambulate at this time) General ADL Comments: Pt reports HA and shakes head yes/no for questions. Pt is minimally conversive, however able to answer questions appropriately when pushed to answer. Pt closes eyes as though in pain when OT speaking, however pt has volume on TV quite loud with no apparent discomfort. Pt was able to reach Bil feet to don socks by crossing legs with no LOB in sitting.     Vision  Pt wears glasses for reading.   Pt reports no change from baseline.  No apparent visual deficits.                Perception Perception Perception Tested?: No   Praxis Praxis Praxis tested?: Within functional limits    Pertinent Vitals/Pain Pt reports HA which she has had x2 days. Pt closes eyes when OT is speaking. RN notified of HA.      Hand Dominance Right   Extremity/Trunk Assessment Upper Extremity Assessment Upper Extremity Assessment: Generalized weakness   Lower Extremity Assessment Lower Extremity Assessment: Defer to PT evaluation   Cervical / Trunk Assessment Cervical / Trunk Assessment: Normal   Communication Communication Communication: No difficulties;Other (comment) (pt minimally conversational, possibly due to headache)   Cognition Arousal/Alertness: Awake/alert Behavior During Therapy: WFL for tasks assessed/performed  Overall Cognitive Status: Difficult to assess                          Home Living Family/patient expects to be discharged to:: Private residence Living Arrangements: Alone Available Help at Discharge: Family;Available PRN/intermittently (daughter) Type of Home: Apartment Home Access: Stairs to enter ITT IndustriesEntrance  Stairs-Number of Steps: 10 (per pt report) Entrance Stairs-Rails: Right;Left;Can reach both Home Layout: One level     Bathroom Shower/Tub: Tub/shower unit Shower/tub characteristics: Engineer, building servicesCurtain Bathroom Toilet: Standard         Additional Comments: per PT communication at time of this note, pt's daughters plan to take her home however they work during the day.       Prior Functioning/Environment Level of Independence: Independent        Comments: pt reports she is independent and does not use AD to ambulate.                               End of Session Equipment Utilized During Treatment: Gait belt  Activity Tolerance: Patient tolerated treatment well Patient left: in bed;with call bell/phone within reach;with bed alarm set   Time: [phone] OT Time Calculation (min): 19 min Charges:  OT General Charges $OT Visit: 1 Procedure OT Evaluation $Initial OT Evaluation Tier I: 1 Procedure OT Treatments $Self Care/Home Management : 8-22 mins  Rae LipsMiller, [place] M [phone] 05/31/2014, 12:51 PM

## 2014-05-31 NOTE — Progress Notes (Signed)
Ref: Ricki RodriguezKADAKIA,Debra Calabretta S, MD   Subjective:  Feeling better. T max 99.8 F. CT head negative for bleed.  Objective:  Vital Signs in the last 24 hours: Temp:  [97.3 F (36.3 C)-99.8 F (37.7 C)] 98.5 F (36.9 C) (07/05 0523) Pulse Rate:  [76-92] 92 (07/05 0523) Cardiac Rhythm:  [-] Normal sinus rhythm;Bundle branch block (07/05 0854) Resp:  [16-18] 18 (07/05 0523) BP: (130-165)/(58-75) 130/58 mmHg (07/05 0947) SpO2:  [95 %-96 %] 95 % (07/05 0523) Weight:  [71.895 kg (158 lb 8 oz)-73.029 kg (161 lb)] 71.895 kg (158 lb 8 oz) (07/05 0523)  Physical Exam: BP Readings from Last 1 Encounters:  05/31/14 130/58    Wt Readings from Last 1 Encounters:  05/31/14 71.895 kg (158 lb 8 oz)    Weight change:   HEENT: [place]/AT, Eyes-Brown, PERL, EOMI, Conjunctiva-Pale pink, Sclera-Non-icteric Neck: No JVD, No bruit, Trachea midline. Lungs:  Clear, Bilateral. Cardiac:  Regular rhythm, normal S1 and S2, no S3. III/VI systolic murmur. Abdomen:  Soft, non-tender. Extremities:  No edema present. No cyanosis. No clubbing. CNS: AxOx3, Cranial nerves grossly intact, moves all 4 extremities. Right handed. Skin: Warm and dry.   Intake/Output from previous day: 07/04 0701 - 07/05 0700 In: 630 [P.O.:480; IV Piggyback:150] Out: 1025 [Urine:1025]    Lab Results: BMET    Component Value Date/Time   NA 140 05/31/2014 0410   NA 143 05/29/2014 0820   NA 146 05/28/2014 0430   K 3.6* 05/31/2014 0410   K 3.9 05/29/2014 0820   K 3.5* 05/28/2014 0430   CL 99 05/31/2014 0410   CL 102 05/29/2014 0820   CL 102 05/28/2014 0430   CO2 24 05/31/2014 0410   CO2 28 05/29/2014 0820   CO2 27 05/28/2014 0430   GLUCOSE 154* 05/31/2014 0410   GLUCOSE 206* 05/29/2014 0820   GLUCOSE 141* 05/28/2014 0430   BUN 18 05/31/2014 0410   BUN 23 05/29/2014 0820   BUN 20 05/28/2014 0430   CREATININE 1.14* 05/31/2014 0410   CREATININE 1.31* 05/29/2014 0820   CREATININE 1.34* 05/28/2014 0430   CALCIUM 9.5 05/31/2014 0410   CALCIUM 9.0 05/29/2014 0820   CALCIUM 8.8  05/28/2014 0430   GFRNONAA 46* 05/31/2014 0410   GFRNONAA 39* 05/29/2014 0820   GFRNONAA 38* 05/28/2014 0430   GFRAA 54* 05/31/2014 0410   GFRAA 45* 05/29/2014 0820   GFRAA 44* 05/28/2014 0430   CBC    Component Value Date/Time   WBC 8.4 05/31/2014 0410   RBC 3.35* 05/31/2014 0410   HGB 10.1* 05/31/2014 0410   HCT 31.1* 05/31/2014 0410   PLT 305 05/31/2014 0410   MCV 92.8 05/31/2014 0410   MCH 30.1 05/31/2014 0410   MCHC 32.5 05/31/2014 0410   RDW 13.1 05/31/2014 0410   LYMPHSABS 2.2 05/29/2014 0500   MONOABS 1.1* 05/29/2014 0500   EOSABS 0.3 05/29/2014 0500   BASOSABS 0.1 05/29/2014 0500   HEPATIC Function Panel  Recent Labs  05/23/14 2310 05/25/14 0401  PROT 6.4 6.3   HEMOGLOBIN A1C No components found with this basename: HGA1C,  MPG   CARDIAC ENZYMES Lab Results  Component Value Date   CKTOTAL 185* 05/26/2014   CKMB <1.0 05/26/2014   TROPONINI 0.42* 05/24/2014   TROPONINI 0.40* 05/24/2014   TROPONINI <0.30 05/24/2014   BNP  Recent Labs  05/24/14 0632 05/25/14 0401  PROBNP 1419.0* 1748.0*   TSH No results found for this basename: TSH,  in the last 8760 hours CHOLESTEROL No results found for this  basename: CHOL,  in the last 8760 hours  Scheduled Meds: . ALPRAZolam  0.25 mg Oral QHS  . amLODipine  5 mg Oral BID  . antiseptic oral rinse  15 mL Mouth Rinse QID  . aspirin  81 mg Oral Daily  . carvedilol  6.25 mg Oral BID WC  . heparin  5,000 Units Subcutaneous 3 times per day  . hydrALAZINE  25 mg Oral QID  . insulin aspart  0-15 Units Subcutaneous TID WC  . insulin glargine  10 Units Subcutaneous QHS  . lisinopril  5 mg Oral Daily  . loratadine  10 mg Oral Daily  . multivitamin  5 mL Per Tube Daily  . pantoprazole  40 mg Oral Daily  . piperacillin-tazobactam (ZOSYN)  IV  3.375 g Intravenous 3 times per day  . potassium chloride  10 mEq Oral Daily   Continuous Infusions: . sodium chloride Stopped (05/29/14 1700)   PRN Meds:.acetaminophen (TYLENOL) oral liquid 160 mg/5 mL, albuterol,  labetalol, ondansetron (ZOFRAN) IV, oxyCODONE, phenol  Assessment/Plan: Status post witnessed cardiac arrest  Acute left heart systolic failure  Minimally elevated troponin I. secondary to CPR/defibrillation/hypertension doubt significant MI  Acute respiratory failure secondary to above/  Aspiration pneumonia  Status post left small pneumothorax status post chest tube  Hypertension  Diabetes mellitus, II  One vessel, native vessel, CAD  Acute renal insufficiency  Hypokalemia  Severe headache  Increase activity.    LOS: 8 days    [name]  MD  05/31/2014, 11:07 AM

## 2014-05-31 NOTE — Evaluation (Signed)
Physical Therapy Evaluation Patient Details Name: Amanda Stewart MRN: [id number] DOB: 10-05-1941 Today's Date: 05/31/2014   History of Present Illness  73 years old with PMH relevant for CAD, CHF with LVEF of 40%, HTN, DM. Sustained witnessed cardiac arrest requiring 15 minutes of CPR and 4 rounds of epi and 2 shocks before returning to spontaneous circulation.  Clinical Impression   Pt admitted with above. Pt currently with functional limitations due to the deficits listed below (see PT Problem List). Noted pt also is quite slow to answer questions, and decr initiation for tasks.  Pt will benefit from skilled PT to increase their independence and safety with mobility to allow discharge to the venue listed below.       Follow Up Recommendations Home health PT (also HHOT, and HHAide)    Equipment Recommendations  Rolling walker with 5" wheels;3in1 (PT)    Recommendations for Other Services Speech consult     Precautions / Restrictions Restrictions Weight Bearing Restrictions: No      Mobility  Bed Mobility Overal bed mobility: Needs Assistance Bed Mobility: Supine to Sit     Supine to sit: Supervision;HOB elevated Sit to supine:  (HOB flat, use of rails)   General bed mobility comments: Pt used rails for bed mobility and moved slowly  Transfers Overall transfer level: Needs assistance Equipment used: Rolling walker (2 wheeled) Transfers: Sit to/from Stand Sit to Stand: Min assist         General transfer comment: Pt required min A to power up to standing.  Ambulation/Gait Ambulation/Gait assistance: Min guard Ambulation Distance (Feet): 75 Feet Assistive device: Rolling walker (2 wheeled) Gait Pattern/deviations: Step-through pattern Gait velocity: slowed   General Gait Details: Noted decr control and coordination with stepping and 2 losses of balance maneuvering in room with RW; Noted better steps in hallway; One standing rest break  Stairs             Wheelchair Mobility    Modified Rankin (Stroke Patients Only)       Balance Overall balance assessment: Needs assistance   Sitting balance-Leahy Scale: Fair     Standing balance support: Bilateral upper extremity supported Standing balance-Leahy Scale: Poor                               Pertinent Vitals/Pain 5/10 chest pain (likely related to CPR) RN provided medication to assist with pain control     Home Living Family/patient expects to be discharged to:: Private residence Living Arrangements: Alone (Plans to dc to daughter's home; house 3 steps) Available Help at Discharge: Family;Available PRN/intermittently (daughter) Type of Home: Apartment Home Access: Stairs to enter Entrance Stairs-Rails: Right;Left;Can reach both Entrance Stairs-Number of Steps: 10 (per pt report; daughter has 3 steps) Home Layout: One level Home Equipment: None Additional Comments: pt's daughters plan to take her home however they work during the day.     Prior Function Level of Independence: Independent         Comments: pt reports she is independent and does not use AD to ambulate.      Hand Dominance   Dominant Hand: Right    Extremity/Trunk Assessment   Upper Extremity Assessment: Defer to OT evaluation           Lower Extremity Assessment: Generalized weakness      Cervical / Trunk Assessment: Normal  Communication   Communication: No difficulties;Other (comment) (pt minimally conversational, possibly due to headache)  Cognition Arousal/Alertness: Awake/alert Behavior During Therapy: WFL for tasks assessed/performed Overall Cognitive Status: Difficult to assess                      General Comments General comments (skin integrity, edema, etc.): Noted  decr initiation of tasks and very slow to answer questions    Exercises        Assessment/Plan    PT Assessment Patient needs continued PT services  PT Diagnosis Difficulty  walking;Generalized weakness   PT Problem List Decreased strength;Decreased activity tolerance;Decreased balance;Decreased mobility;Decreased coordination;Decreased cognition;Decreased knowledge of use of DME;Decreased safety awareness;Pain  PT Treatment Interventions DME instruction;Gait training;Stair training;Functional mobility training;Therapeutic activities;Therapeutic exercise;Balance training;Cognitive remediation;Patient/family education   PT Goals (Current goals can be found in the Care Plan section) Acute Rehab PT Goals Patient Stated Goal: to go home PT Goal Formulation: With patient Time For Goal Achievement: 06/14/14 Potential to Achieve Goals: Good    Frequency Min 3X/week   Barriers to discharge   Plans to dc to daughter's home; pt will still need to be independent to safely dc home as she will be home alone for large protions of the day    Co-evaluation               End of Session Equipment Utilized During Treatment: Gait belt Activity Tolerance: Patient tolerated treatment well Patient left: in chair;with call bell/phone within reach;with family/visitor present (setup fro lunch) Nurse Communication: Mobility status         Time: [id number] PT Time Calculation (min): 30 min   Charges:   PT Evaluation $Initial PT Evaluation Tier I: 1 Procedure PT Treatments $Gait Training: 8-22 mins $Therapeutic Activity: 8-22 mins   PT G Codes:          Olen PelGarrigan, Trace Cederberg Hamff 05/31/2014, 2:29 PM Van ClinesHolly Anala Whisenant, [place]PT  Acute Rehabilitation Services Pager [phone] Office [phone]

## 2014-06-01 LAB — GLUCOSE, CAPILLARY
GLUCOSE-CAPILLARY: 175 mg/dL — AB (ref 70–99)
GLUCOSE-CAPILLARY: 121 mg/dL — AB (ref 70–99)
GLUCOSE-CAPILLARY: 134 mg/dL — AB (ref 70–99)

## 2014-06-01 MED ORDER — POTASSIUM CHLORIDE CRYS ER 10 MEQ PO TBCR: 10.0000 meq | EXTENDED_RELEASE_TABLET | Freq: Every day | ORAL | Status: DC

## 2014-06-01 MED ORDER — PANTOPRAZOLE SODIUM 40 MG PO TBEC: 40.0000 mg | DELAYED_RELEASE_TABLET | Freq: Every day | ORAL | Status: DC

## 2014-06-01 MED ORDER — LISINOPRIL 5 MG PO TABS
5.0000 mg | ORAL_TABLET | Freq: Every day | ORAL | Status: DC
Start: 2014-06-01 — End: 2021-08-10

## 2014-06-01 MED ORDER — LORATADINE 10 MG PO TABS: 10.0000 mg | ORAL_TABLET | Freq: Every day | ORAL | Status: DC

## 2014-06-01 MED ORDER — PHENOL 1.4 % MT LIQD: 1.0000 | OROMUCOSAL | Status: DC | PRN

## 2014-06-01 MED ORDER — AMLODIPINE BESYLATE 5 MG PO TABS: 5.0000 mg | ORAL_TABLET | Freq: Two times a day (BID) | ORAL | Status: DC

## 2014-06-01 MED ORDER — HYDRALAZINE HCL 25 MG PO TABS: 25.0000 mg | ORAL_TABLET | Freq: Four times a day (QID) | ORAL | Status: DC

## 2014-06-01 MED ORDER — INSULIN GLARGINE 100 UNIT/ML ~~LOC~~ SOLN: 10.0000 [IU] | Freq: Every day | SUBCUTANEOUS | Status: DC

## 2014-06-01 NOTE — Consult Note (Addendum)
WOC wound consult note Reason for Consult: Consult requested for sacrum.  Pt has a grey macerated area in the gluteal cleft; appearance is consistent with moisture-associated skin damage.   Pressure Ulcer POA: Present on admission; this is NOT a pressure ulcer. Measurement: approx 2X.2cm Plan:  Barrier cream to protect and repel moisture. Please re-consult if further assistance is needed.  Thank-you,  Cammie Mcgee MSN, RN, CWOCN, [place], CNS [phone]

## 2014-06-01 NOTE — Progress Notes (Signed)
Ref: Amanda RodriguezKADAKIA,Amanda Napier S, MD   Subjective:  Feeling better every day. Little slow to respond.  T max 99.2 F  Objective:  Vital Signs in the last 24 hours: Temp:  [98.5 F (36.9 C)-99.2 F (37.3 C)] 99.2 F (37.3 C) (07/06 0437) Pulse Rate:  [58-102] 102 (07/06 0815) Cardiac Rhythm:  [-]  Resp:  [18] 18 (07/06 0437) BP: (130-152)/(34-64) 139/62 mmHg (07/06 0815) SpO2:  [94 %-96 %] 94 % (07/06 0437) Weight:  [72 kg (158 lb 11.7 oz)] 72 kg (158 lb 11.7 oz) (07/06 0437)  Physical Exam: BP Readings from Last 1 Encounters:  06/01/14 139/62    Wt Readings from Last 1 Encounters:  06/01/14 72 kg (158 lb 11.7 oz)    Weight change: -1.029 kg (-2 lb 4.3 oz)  HEENT: [place]/AT, Eyes-Brown, PERL, EOMI, Conjunctiva-Pale pink, Sclera-Non-icteric Neck: No JVD, No bruit, Trachea midline. Lungs:  Clear, Bilateral. Cardiac:  Regular rhythm, normal S1 and S2, no S3. III/VI systolic murmur. Abdomen:  Soft, non-tender. Extremities:  No edema present. No cyanosis. No clubbing. CNS: AxOx3, Cranial nerves grossly intact, moves all 4 extremities. Right handed. Skin: Warm and dry.   Intake/Output from previous day: 07/05 0701 - 07/06 0700 In: 420 [P.O.:420] Out: 750 [Urine:750]    Lab Results: BMET    Component Value Date/Time   NA 140 05/31/2014 0410   NA 143 05/29/2014 0820   NA 146 05/28/2014 0430   K 3.6* 05/31/2014 0410   K 3.9 05/29/2014 0820   K 3.5* 05/28/2014 0430   CL 99 05/31/2014 0410   CL 102 05/29/2014 0820   CL 102 05/28/2014 0430   CO2 24 05/31/2014 0410   CO2 28 05/29/2014 0820   CO2 27 05/28/2014 0430   GLUCOSE 154* 05/31/2014 0410   GLUCOSE 206* 05/29/2014 0820   GLUCOSE 141* 05/28/2014 0430   BUN 18 05/31/2014 0410   BUN 23 05/29/2014 0820   BUN 20 05/28/2014 0430   CREATININE 1.14* 05/31/2014 0410   CREATININE 1.31* 05/29/2014 0820   CREATININE 1.34* 05/28/2014 0430   CALCIUM 9.5 05/31/2014 0410   CALCIUM 9.0 05/29/2014 0820   CALCIUM 8.8 05/28/2014 0430   GFRNONAA 46* 05/31/2014 0410   GFRNONAA 39* 05/29/2014  0820   GFRNONAA 38* 05/28/2014 0430   GFRAA 54* 05/31/2014 0410   GFRAA 45* 05/29/2014 0820   GFRAA 44* 05/28/2014 0430   CBC    Component Value Date/Time   WBC 8.4 05/31/2014 0410   RBC 3.35* 05/31/2014 0410   HGB 10.1* 05/31/2014 0410   HCT 31.1* 05/31/2014 0410   PLT 305 05/31/2014 0410   MCV 92.8 05/31/2014 0410   MCH 30.1 05/31/2014 0410   MCHC 32.5 05/31/2014 0410   RDW 13.1 05/31/2014 0410   LYMPHSABS 2.2 05/29/2014 0500   MONOABS 1.1* 05/29/2014 0500   EOSABS 0.3 05/29/2014 0500   BASOSABS 0.1 05/29/2014 0500   HEPATIC Function Panel  Recent Labs  05/23/14 2310 05/25/14 0401  PROT 6.4 6.3   HEMOGLOBIN A1C No components found with this basename: HGA1C,  MPG   CARDIAC ENZYMES Lab Results  Component Value Date   CKTOTAL 185* 05/26/2014   CKMB <1.0 05/26/2014   TROPONINI 0.42* 05/24/2014   TROPONINI 0.40* 05/24/2014   TROPONINI <0.30 05/24/2014   BNP  Recent Labs  05/24/14 0632 05/25/14 0401  PROBNP 1419.0* 1748.0*   TSH No results found for this basename: TSH,  in the last 8760 hours CHOLESTEROL No results found for this basename: CHOL,  in  the last 8760 hours  Scheduled Meds: . ALPRAZolam  0.25 mg Oral QHS  . amLODipine  5 mg Oral BID  . antiseptic oral rinse  15 mL Mouth Rinse QID  . aspirin  81 mg Oral Daily  . carvedilol  6.25 mg Oral BID WC  . heparin  5,000 Units Subcutaneous 3 times per day  . hydrALAZINE  25 mg Oral QID  . insulin aspart  0-15 Units Subcutaneous TID WC  . insulin glargine  10 Units Subcutaneous QHS  . lisinopril  5 mg Oral Daily  . loratadine  10 mg Oral Daily  . multivitamin  5 mL Per Tube Daily  . pantoprazole  40 mg Oral Daily  . potassium chloride  10 mEq Oral Daily   Continuous Infusions: . sodium chloride Stopped (05/29/14 1700)   PRN Meds:.acetaminophen (TYLENOL) oral liquid 160 mg/5 mL, albuterol, labetalol, ondansetron (ZOFRAN) IV, oxyCODONE, phenol  Assessment/Plan: Status post witnessed cardiac arrest  Acute left heart systolic failure   Minimally elevated troponin I. secondary to CPR/defibrillation/hypertension doubt significant MI  Acute respiratory failure secondary to above/  Aspiration pneumonia  Status post left small pneumothorax status post chest tube  Hypertension  Diabetes mellitus, II  One vessel, native vessel, CAD  Acute renal insufficiency  Hypokalemia  Severe headache  Decrease xanax further. Increase activity. Home if family support otherwise Skilled NF.     LOS: 9 days    [name]  MD  06/01/2014, 9:33 AM

## 2014-06-01 NOTE — Discharge Summary (Signed)
Physician Discharge Summary  Patient ID: Amanda Stewart MRN: [id number] DOB/AGE: [birth year] 73 y.o.  Admit date: 05/23/2014 Discharge date: 06/01/2014  Admission Diagnoses: Status post witnessed cardiac arrest  Acute left heart systolic failure  Minimally elevated troponin I. secondary to CPR/defibrillation/hypertension doubt significant MI  Acute respiratory failure secondary to above/  Aspiration pneumonia  Status post left small pneumothorax status post chest tube  Hypertension  Diabetes mellitus, II   Discharge Diagnoses:  Principle Problem: * Acute NSTEMI * Cardiac arrest Ventricular fibrillation Acute respiratory failure with hypoxia Acute respiratory failure secondary to above Aspiration pneumonia  Status post left small pneumothorax status post chest tube  Hypertension  Diabetes mellitus, II  One vessel, native vessel, CAD  Acute renal insufficiency  Hypokalemia  Severe headache  Discharged Condition: fair  Hospital Course: 73 years old with PMH relevant for CAD, CHF with LVEF of 40%, HTN, DM. sustained witnessed cardiac arrest requiring 15 minutes of CPR and 4 rounds of epinephrine and 2 shocks before returning to spontaneous circulation. Initial rhythm was V. Fib. Got a King airway and was intubated at arrival to the ED. She also had aspiration pneumonia treated with antibiotic Zosyn with good clinical improvement. She was extubated post cardiac cath on 3rd. Day. Her blood pressure gradually improved and required oral medications to be resumed. Her CT scan x 2 were negative for acute pathology or bleed. Her cardiac catheterization showed significant stenosis in narrow (1.5 mm) left circumflex artery and was left for medical treatment. Her elevated sugars were controlled with Lantus and sliding scale insulin. With good family support she was discharged home with follow up by me in 1 week.  Consults: cardiology and pulmonary/intensive care  Significant Diagnostic Studies: labs:  Near normal electrolytes, BUN/CR and elevated sugars. Decrease in Lasix dose with improving renal function.  EKG on admission-Accelerated junctional rhythm EKG in 48 hours-Sinus rhythm with 1st degree AV block and LBBB.  Cardiac cath- Significant one vessel CAD-namely proximal LCX.  Treatments: antibiotics: Zosyn and cardiac meds: lisinopril (Prinivil), carvedilol, amlodipine, furosemide and Hydralazine.  Discharge Exam: Blood pressure 139/62, pulse 102, temperature 99.2 F (37.3 C), temperature source Oral, resp. rate 18, height 5\' 4"  (1.626 m), weight 72 kg (158 lb 11.7 oz), SpO2 94.00%.  HEENT: [place]/AT, Eyes-Brown, PERL, EOMI, Conjunctiva-Pale pink, Sclera-Non-icteric  Neck: No JVD, No bruit, Trachea midline.  Lungs: Clear, Bilateral.  Cardiac: Regular rhythm, normal S1 and S2, no S3. III/VI systolic murmur.  Abdomen: Soft, non-tender.  Extremities: No edema present. No cyanosis. No clubbing.  CNS: AxOx3, Cranial nerves grossly intact, moves all 4 extremities. Right handed.  Skin: Warm and dry.  Disposition: Final discharge disposition not confirmed     Medication List    STOP taking these medications       lisinopril-hydrochlorothiazide 10-12.5 MG per tablet  Commonly known as:  PRINZIDE,ZESTORETIC     metFORMIN 500 MG tablet  Commonly known as:  GLUCOPHAGE      TAKE these medications       amLODipine 5 MG tablet  Commonly known as:  NORVASC  Take 1 tablet (5 mg total) by mouth 2 (two) times daily.     aspirin EC 81 MG tablet  Take 81 mg by mouth daily.     carvedilol 12.5 MG tablet  Commonly known as:  COREG  Take 12.5 mg by mouth 2 (two) times daily with a meal.     hydrALAZINE 25 MG tablet  Commonly known as:  APRESOLINE  Take 1 tablet (25  mg total) by mouth 4 (four) times daily.     insulin glargine 100 UNIT/ML injection  Commonly known as:  LANTUS  Inject 0.1 mLs (10 Units total) into the skin at bedtime.     linagliptin 5 MG Tabs tablet  Commonly  known as:  TRADJENTA  Take 5 mg by mouth daily.     lisinopril 5 MG tablet  Commonly known as:  PRINIVIL,ZESTRIL  Take 1 tablet (5 mg total) by mouth daily.     loratadine 10 MG tablet  Commonly known as:  CLARITIN  Take 1 tablet (10 mg total) by mouth daily.     pantoprazole 40 MG tablet  Commonly known as:  PROTONIX  Take 1 tablet (40 mg total) by mouth daily.     phenol 1.4 % Liqd  Commonly known as:  CHLORASEPTIC  Use as directed 1 spray in the mouth or throat as needed for throat irritation / pain.     potassium chloride 10 MEQ tablet  Commonly known as:  K-DUR,KLOR-CON  Take 1 tablet (10 mEq total) by mouth daily.           Follow-up Information   Follow up with [facility]. Social research officer, government, Physical/Occupational Therapy and Aide)    Specialty:  Home Health Services   Contact information:   [address] [place] [address] [phone]       Follow up with [facility]. Adult nurse and Bedside [employer])    Contact information:   [address] [place] [address] [phone]       Follow up with [facility] S, MD. Schedule an appointment as soon as possible for a visit in 1 week.   Specialty:  Cardiology   Contact information:   [address] [place] [address] [phone]       Signed: [name] 06/01/2014, 2:04 PM

## 2014-06-01 NOTE — Progress Notes (Signed)
Provided D/C instructions and home equipment to Daughter Marylene Land at bedside. Questions answered. Transferred out via w/c, alert and conversant.

## 2014-06-01 NOTE — Progress Notes (Signed)
Daughter Marylu Lund called and made aware of Pt being discharged. States will take "couple of hours" to be here around 1800 to transfer out. Pt notified.

## 2014-06-01 NOTE — Progress Notes (Signed)
Central line removed by Dr. [name].

## 2014-11-05 ENCOUNTER — Encounter (HOSPITAL_COMMUNITY): Payer: Self-pay | Admitting: Cardiovascular Disease

## 2014-12-10 ENCOUNTER — Observation Stay (HOSPITAL_COMMUNITY)
Admission: EM | Admit: 2014-12-10 | Discharge: 2014-12-12 | Disposition: A | Discharge status: Home / Self Care | Payer: Medicare HMO | Attending: Cardiovascular Disease | Admitting: Cardiovascular Disease

## 2014-12-10 ENCOUNTER — Encounter (HOSPITAL_COMMUNITY): Payer: Self-pay | Admitting: Emergency Medicine

## 2014-12-10 ENCOUNTER — Emergency Department (HOSPITAL_COMMUNITY): Payer: Medicare HMO

## 2014-12-10 DIAGNOSIS — K219 Gastro-esophageal reflux disease without esophagitis: Secondary | ICD-10-CM | POA: Diagnosis not present

## 2014-12-10 DIAGNOSIS — I10 Essential (primary) hypertension: Secondary | ICD-10-CM | POA: Diagnosis not present

## 2014-12-10 DIAGNOSIS — Z7982 Long term (current) use of aspirin: Secondary | ICD-10-CM | POA: Diagnosis not present

## 2014-12-10 DIAGNOSIS — I499 Cardiac arrhythmia, unspecified: Secondary | ICD-10-CM | POA: Insufficient documentation

## 2014-12-10 DIAGNOSIS — I252 Old myocardial infarction: Secondary | ICD-10-CM | POA: Insufficient documentation

## 2014-12-10 DIAGNOSIS — I447 Left bundle-branch block, unspecified: Secondary | ICD-10-CM | POA: Insufficient documentation

## 2014-12-10 DIAGNOSIS — E785 Hyperlipidemia, unspecified: Secondary | ICD-10-CM | POA: Diagnosis not present

## 2014-12-10 DIAGNOSIS — I5040 Unspecified combined systolic (congestive) and diastolic (congestive) heart failure: Secondary | ICD-10-CM | POA: Diagnosis not present

## 2014-12-10 DIAGNOSIS — R0789 Other chest pain: Secondary | ICD-10-CM | POA: Diagnosis not present

## 2014-12-10 DIAGNOSIS — E119 Type 2 diabetes mellitus without complications: Secondary | ICD-10-CM | POA: Diagnosis not present

## 2014-12-10 DIAGNOSIS — R0602 Shortness of breath: Principal | ICD-10-CM | POA: Insufficient documentation

## 2014-12-10 DIAGNOSIS — I2510 Atherosclerotic heart disease of native coronary artery without angina pectoris: Secondary | ICD-10-CM | POA: Insufficient documentation

## 2014-12-10 DIAGNOSIS — D649 Anemia, unspecified: Secondary | ICD-10-CM | POA: Diagnosis not present

## 2014-12-10 DIAGNOSIS — Z79899 Other long term (current) drug therapy: Secondary | ICD-10-CM | POA: Diagnosis not present

## 2014-12-10 DIAGNOSIS — Z23 Encounter for immunization: Secondary | ICD-10-CM | POA: Insufficient documentation

## 2014-12-10 DIAGNOSIS — R079 Chest pain, unspecified: Secondary | ICD-10-CM | POA: Diagnosis present

## 2014-12-10 DIAGNOSIS — Z9861 Coronary angioplasty status: Secondary | ICD-10-CM | POA: Insufficient documentation

## 2014-12-10 LAB — CBC
HCT: 38.4 % (ref 36.0–46.0)
Hemoglobin: 12.9 g/dL (ref 12.0–15.0)
MCH: 30.4 pg (ref 26.0–34.0)
MCHC: 33.6 g/dL (ref 30.0–36.0)
MCV: 90.4 fL (ref 78.0–100.0)
PLATELETS: 265 10*3/uL (ref 150–400)
RBC: 4.25 MIL/uL (ref 3.87–5.11)
RDW: 13.1 % (ref 11.5–15.5)
WBC: 6.4 10*3/uL (ref 4.0–10.5)

## 2014-12-10 LAB — D-DIMER, QUANTITATIVE: D-Dimer, Quant: 0.47 ug{FEU}/mL (ref 0.00–0.48)

## 2014-12-10 LAB — BASIC METABOLIC PANEL WITH GFR
Anion gap: 11 (ref 5–15)
BUN: 16 mg/dL (ref 6–23)
CO2: 25 mmol/L (ref 19–32)
Calcium: 9.9 mg/dL (ref 8.4–10.5)
Chloride: 104 meq/L (ref 96–112)
Creatinine, Ser: 0.91 mg/dL (ref 0.50–1.10)
GFR calc Af Amer: 70 mL/min — ABNORMAL LOW
GFR calc non Af Amer: 60 mL/min — ABNORMAL LOW
Glucose, Bld: 158 mg/dL — ABNORMAL HIGH (ref 70–99)
Potassium: 4.2 mmol/L (ref 3.5–5.1)
Sodium: 140 mmol/L (ref 135–145)

## 2014-12-10 LAB — TROPONIN I: Troponin I: <0.03 ng/mL (ref <0.031)

## 2014-12-10 LAB — I-STAT TROPONIN, ED: Troponin i, poc: 0 ng/mL (ref 0.00–0.08)

## 2014-12-10 LAB — BRAIN NATRIURETIC PEPTIDE: B Natriuretic Peptide: 42.8 pg/mL (ref 0.0–100.0)

## 2014-12-10 MED ORDER — LINAGLIPTIN 5 MG PO TABS
5.0000 mg | ORAL_TABLET | Freq: Every day | ORAL | Status: DC
  Administered 2014-12-10 – 2014-12-12 (×3): 5 mg via ORAL
  Filled 2014-12-10 (×4): qty 1

## 2014-12-10 MED ORDER — PHENOL 1.4 % MT LIQD
1.0000 | OROMUCOSAL | Status: DC | PRN
  Filled 2014-12-10: qty 177

## 2014-12-10 MED ORDER — HEPARIN BOLUS VIA INFUSION
3000.0000 [IU] | Freq: Once | INTRAVENOUS | Status: AC
  Administered 2014-12-10: 3000 [IU] via INTRAVENOUS
  Filled 2014-12-10: qty 3000

## 2014-12-10 MED ORDER — HEPARIN (PORCINE) IN NACL 100-0.45 UNIT/ML-% IJ SOLN
900.0000 [IU]/h | INTRAMUSCULAR | Status: DC
  Administered 2014-12-10 – 2014-12-11 (×2): 900 [IU]/h via INTRAVENOUS
  Filled 2014-12-10 (×2): qty 250

## 2014-12-10 MED ORDER — AMLODIPINE BESYLATE 5 MG PO TABS
5.0000 mg | ORAL_TABLET | Freq: Two times a day (BID) | ORAL | Status: DC
  Administered 2014-12-10 – 2014-12-12 (×4): 5 mg via ORAL
  Filled 2014-12-10 (×4): qty 1

## 2014-12-10 MED ORDER — CARVEDILOL 12.5 MG PO TABS
12.5000 mg | ORAL_TABLET | Freq: Two times a day (BID) | ORAL | Status: DC
  Administered 2014-12-11 (×2): 12.5 mg via ORAL
  Filled 2014-12-10 (×4): qty 1

## 2014-12-10 MED ORDER — GLIMEPIRIDE 1 MG PO TABS
2.0000 mg | ORAL_TABLET | Freq: Every day | ORAL | Status: DC
  Administered 2014-12-10 – 2014-12-12 (×3): 2 mg via ORAL
  Filled 2014-12-10: qty 2
  Filled 2014-12-10: qty 1
  Filled 2014-12-10 (×2): qty 2

## 2014-12-10 MED ORDER — INSULIN ASPART 100 UNIT/ML ~~LOC~~ SOLN: 0.0000 [IU] | Freq: Three times a day (TID) | SUBCUTANEOUS | Status: DC

## 2014-12-10 MED ORDER — ASPIRIN EC 81 MG PO TBEC
81.0000 mg | DELAYED_RELEASE_TABLET | Freq: Every day | ORAL | Status: DC
  Administered 2014-12-11 – 2014-12-12 (×2): 81 mg via ORAL
  Filled 2014-12-10 (×2): qty 1

## 2014-12-10 MED ORDER — LISINOPRIL 5 MG PO TABS
5.0000 mg | ORAL_TABLET | Freq: Every day | ORAL | Status: DC
  Administered 2014-12-10 – 2014-12-12 (×3): 5 mg via ORAL
  Filled 2014-12-10 (×3): qty 1

## 2014-12-10 MED ORDER — ONDANSETRON HCL 4 MG/2ML IJ SOLN: 4.0000 mg | Freq: Four times a day (QID) | INTRAMUSCULAR | Status: DC | PRN

## 2014-12-10 MED ORDER — ACETAMINOPHEN 325 MG PO TABS
650.0000 mg | ORAL_TABLET | ORAL | Status: DC | PRN
  Administered 2014-12-11 (×3): 650 mg via ORAL
  Filled 2014-12-10 (×3): qty 2

## 2014-12-10 MED ORDER — ASPIRIN 300 MG RE SUPP: 300.0000 mg | RECTAL | Status: AC

## 2014-12-10 MED ORDER — NITROGLYCERIN 0.4 MG SL SUBL: 0.4000 mg | SUBLINGUAL_TABLET | SUBLINGUAL | Status: DC | PRN

## 2014-12-10 MED ORDER — NITROGLYCERIN 0.4 MG SL SUBL
0.4000 mg | SUBLINGUAL_TABLET | SUBLINGUAL | Status: AC | PRN
  Administered 2014-12-10 (×3): 0.4 mg via SUBLINGUAL
  Filled 2014-12-10: qty 1

## 2014-12-10 MED ORDER — POTASSIUM CHLORIDE CRYS ER 10 MEQ PO TBCR
10.0000 meq | EXTENDED_RELEASE_TABLET | Freq: Every day | ORAL | Status: DC
  Administered 2014-12-10 – 2014-12-12 (×3): 10 meq via ORAL
  Filled 2014-12-10 (×3): qty 1

## 2014-12-10 MED ORDER — METFORMIN HCL 500 MG PO TABS
1000.0000 mg | ORAL_TABLET | Freq: Two times a day (BID) | ORAL | Status: DC
  Administered 2014-12-11 (×2): 1000 mg via ORAL
  Filled 2014-12-10 (×3): qty 2

## 2014-12-10 MED ORDER — ASPIRIN 81 MG PO CHEW
324.0000 mg | CHEWABLE_TABLET | ORAL | Status: AC
  Filled 2014-12-10: qty 4

## 2014-12-10 NOTE — ED Notes (Signed)
Dr. Harwani at bedside. 

## 2014-12-10 NOTE — ED Provider Notes (Addendum)
74 year old female had an episode of chest pain while watching television. She describes a pressure and heavy feeling which she rated at 9/10. There is associated dyspnea but no nausea or diaphoresis. There is no radiation of pain. She did not have any nitroglycerin at home. She called for EMS who gave her aspirin and pain did go away. Pain lasted about 25 minutes. Of note, she is a survivor of out of hospital cardiac arrest last June. Cardiac catheterization at that time showed significant narrowing in a.m. small vessel that was not amenable to stent placement. On exam, lungs are clear and heart has regular rate and rhythm. ECG shows left bundle branch block which is unchanged. Given her history and nature of her pain, she will need to be kept for serial troponins.  I saw and evaluated the patient, reviewed the resident's note and I agree with the findings and plan.   EKG Interpretation   Date/Time:  Thursday December 10 2014 15:05:05 EST Ventricular Rate:  105 PR Interval:  107 QRS Duration: 148 QT Interval:  462 QTC Calculation: 611 R Axis:   -65 Text Interpretation:  Age not entered, assumed to be  74 years old for  purpose of ECG interpretation Sinus tachycardia Left bundle branch block  When compared with ECG of 04/09/2009, No significant change was found  Confirmed by [facility]  MD, Joson Sapp ([id number]) on 12/10/2014 3:36:25 PM        Dione Booze, MD 12/10/14 1609  I saw and evaluated the patient, reviewed the resident's note and I agree with the findings and plan.   EKG Interpretation   Date/Time:  Thursday December 10 2014 17:16:31 EST Ventricular Rate:  104 PR Interval:  103 QRS Duration: 150 QT Interval:  425 QTC Calculation: 559 R Axis:   -59 Text Interpretation:  Sinus or ectopic atrial tachycardia Left bundle  branch block No significant change since last tracing Confirmed by [facility]   MD, Ramey Schiff ([id number]) on 12/10/2014 5:24:05 PM        Dione Booze, MD 12/10/14 1724

## 2014-12-10 NOTE — ED Notes (Signed)
Report attempt x2.

## 2014-12-10 NOTE — ED Notes (Signed)
Report attempt x 1 

## 2014-12-10 NOTE — Progress Notes (Signed)
ANTICOAGULATION CONSULT NOTE - Initial Consult  Pharmacy Consult for heparin Indication: chest pain/ACS  No Active Allergies  Patient Measurements: Height: 5\' 6"  (167.6 cm) Weight: 157 lb 9.6 oz (71.487 kg) IBW/kg (Calculated) : 59.3 Heparin Dosing Weight: 71kg  Vital Signs: Temp: 99.1 F (37.3 C) (01/14 1508) Temp Source: Oral (01/14 2018) BP: 134/56 mmHg (01/14 2018) Pulse Rate: 73 (01/14 1930)  Labs:  Recent Labs  12/10/14 1520  HGB 12.9  HCT 38.4  PLT 265  CREATININE 0.91    Estimated Creatinine Clearance: 54.1 mL/min (by C-G formula based on Cr of 0.91).   Medical History: Past Medical History  Diagnosis Date  . Diabetes mellitus without complication   . Hypertension   . Chest pain   . Arrhythmia   . Left bundle branch block   . CAD (coronary artery disease)   . Hyperlipidemia   . Combined systolic and diastolic heart failure   . GERD (gastroesophageal reflux disease)   . Anemia   . Myocardial infarction     Assessment: 25 YOF seen here with chest pain with some dyspnea without nausea or diaphoresis. Noted she is a cardiac arrest survivor from last June- cath at that time show significant narrowing that was not amenable to stent. Patient is not on anticoagulants PTA. Baseline Hgb 12.9, plts 265. No bleeding noted.  Goal of Therapy:  Heparin level 0.3-0.7 units/ml Monitor platelets by anticoagulation protocol: Yes   Plan:  -heparin bolus with 3000 units IV x1, then start infusion at 900 units/hr -heparin level in 8 hours -daily HL and CBC  -follow for s/s bleeding  Lundyn Coste D. Sofya Moustafa, PharmD, BCPS Clinical Pharmacist Pager: [phone] 12/10/2014 8:37 PM

## 2014-12-10 NOTE — ED Notes (Addendum)
Per ems, pt is status post code arrest x 6 months, here today c/o chest pain.  Received 324 asa pta, currently pain free

## 2014-12-10 NOTE — ED Provider Notes (Signed)
CSN: [id number]     Arrival date & time 12/10/14  1504 History   First MD Initiated Contact with Patient 12/10/14 1522     Chief Complaint  Patient presents with  . Chest Pain     (Consider location/radiation/quality/duration/timing/severity/associated sxs/prior Treatment) Patient is a 74 y.o. female presenting with chest pain. The history is provided by the patient and a relative. No language interpreter was used.  Chest Pain Pain location:  Substernal area Pain quality: sharp   Pain radiates to:  Does not radiate Pain radiates to the back: no   Pain severity:  Moderate Onset quality:  Sudden Duration:  10 minutes Timing:  Intermittent Progression:  Resolved Chronicity:  New Context: breathing   Context: not lifting, not raising an arm, not at rest and no stress   Relieved by:  Certain positions Worsened by:  Exertion and certain positions Ineffective treatments:  None tried Associated symptoms: shortness of breath   Associated symptoms: no abdominal pain, no anorexia, no back pain, no cough, no dizziness, no dysphagia, no nausea, no numbness, not vomiting and no weakness   Risk factors: coronary artery disease, diabetes mellitus, high cholesterol and hypertension   Risk factors: no aortic disease, no birth control and not female     Past Medical History  Diagnosis Date  . Diabetes mellitus without complication   . Hypertension   . Chest pain   . Arrhythmia   . Left bundle branch block   . CAD (coronary artery disease)   . Hyperlipidemia   . Combined systolic and diastolic heart failure   . GERD (gastroesophageal reflux disease)   . Anemia   . Myocardial infarction    Past Surgical History  Procedure Laterality Date  . Abdominal hysterectomy    . Left and right heart catheterization with coronary angiogram N/A 05/26/2014    Procedure: LEFT AND RIGHT HEART CATHETERIZATION WITH CORONARY ANGIOGRAM;  Surgeon: [name], MD;  Location: [facility] CATH LAB;  Service:  Cardiovascular;  Laterality: N/A;   History reviewed. No pertinent family history. History  Substance Use Topics  . Smoking status: Never Smoker   . Smokeless tobacco: Not on file  . Alcohol Use: 0.6 oz/week    1 Glasses of wine per week   OB History    No data available     Review of Systems  HENT: Negative for trouble swallowing.   Respiratory: Positive for shortness of breath. Negative for cough.   Cardiovascular: Positive for chest pain.  Gastrointestinal: Negative for nausea, vomiting, abdominal pain and anorexia.  Genitourinary: Negative for dysuria, urgency and frequency.  Musculoskeletal: Negative for back pain.  Neurological: Negative for dizziness, weakness and numbness.  All other systems reviewed and are negative.     Allergies  Review of patient's allergies indicates no active allergies.  Home Medications   Prior to Admission medications   Medication Sig Start Date End Date Taking? Authorizing Provider  amLODipine (NORVASC) 5 MG tablet Take 1 tablet (5 mg total) by mouth 2 (two) times daily. 06/01/14  Yes [name], MD  aspirin EC 81 MG tablet Take 81 mg by mouth daily.   Yes Historical Provider, MD  carvedilol (COREG) 12.5 MG tablet Take 12.5 mg by mouth 2 (two) times daily with a meal.   Yes Historical Provider, MD  glimepiride (AMARYL) 2 MG tablet Take 2 mg by mouth daily. 11/13/14  Yes Historical Provider, MD  hydrALAZINE (APRESOLINE) 25 MG tablet Take 1 tablet (25 mg total) by mouth  4 (four) times daily. 06/01/14  Yes [name], MD  linagliptin (TRADJENTA) 5 MG TABS tablet Take 5 mg by mouth daily.   Yes Historical Provider, MD  lisinopril (PRINIVIL,ZESTRIL) 5 MG tablet Take 1 tablet (5 mg total) by mouth daily. 06/01/14  Yes [name], MD  metFORMIN (GLUCOPHAGE) 1000 MG tablet Take 1,000 mg by mouth 2 (two) times daily with a meal.   Yes Historical Provider, MD  phenol (CHLORASEPTIC) 1.4 % LIQD Use as directed 1 spray in the mouth or throat as  needed for throat irritation / pain. 06/01/14  Yes [name], MD  potassium chloride (K-DUR,KLOR-CON) 10 MEQ tablet Take 1 tablet (10 mEq total) by mouth daily. 06/01/14  Yes [name], MD   BP 134/56 mmHg  Pulse 73  Temp(Src) 98.7 F (37.1 C) (Oral)  Resp 16  Ht 5\' 6"  (1.676 m)  Wt 157 lb 9.6 oz (71.487 kg)  BMI 25.45 kg/m2  SpO2 95% Physical Exam  Constitutional: She is oriented to person, place, and time. She appears well-developed and well-nourished. No distress.  HENT:  Head: Normocephalic and atraumatic.  Eyes: Pupils are equal, round, and reactive to light.  Neck: Normal range of motion.  Cardiovascular: Normal rate, regular rhythm, normal heart sounds and intact distal pulses.   Pulses:      Radial pulses are 2+ on the right side, and 2+ on the left side.  Pulmonary/Chest: Effort normal. No respiratory distress. She has no wheezes. She exhibits no tenderness.  Abdominal: Soft. Bowel sounds are normal. She exhibits no distension. There is no tenderness. There is no rebound and no guarding.  Neurological: She is alert and oriented to person, place, and time. She has normal strength. No cranial nerve deficit or sensory deficit. She exhibits normal muscle tone. Coordination and gait normal.  Skin: Skin is warm and dry.  Nursing note and vitals reviewed.   ED Course  Procedures (including critical care time) Labs Review Labs Reviewed  BASIC METABOLIC PANEL - Abnormal; Notable for the following:    Glucose, Bld 158 (*)    GFR calc non Af Amer 60 (*)    GFR calc Af Amer 70 (*)    All other components within normal limits  CBC  BRAIN NATRIURETIC PEPTIDE  D-DIMER, QUANTITATIVE  TROPONIN I  HEMOGLOBIN A1C  TROPONIN I  TROPONIN I  BASIC METABOLIC PANEL  LIPID PANEL  PROTIME-INR  HEPARIN LEVEL (UNFRACTIONATED)  CBC  I-STAT TROPOININ, ED    Imaging Review Dg Chest 2 View  12/10/2014   CLINICAL DATA:  Shortness of breath earlier today. Mid chest pain. History of  myocardial infarction in 05/2014.  EXAM: CHEST  2 VIEW  COMPARISON:  04/09/2009; 01/01/2005; chest CT - 01/01/2005  FINDINGS: Grossly unchanged borderline enlarged cardiac silhouette and mediastinal contours with atherosclerotic plaque within the thoracic aorta. Overall improved aeration of the lungs. No focal airspace opacities. No pleural effusion or pneumothorax. There is minimal pleural parenchymal thickening about the right minor fissure. No evidence of edema. Stigmata of dish within the thoracic spine. No acute osseus abnormalities.  IMPRESSION: Borderline cardiomegaly without acute cardiopulmonary disease.   Electronically Signed   By: Simonne Come M.D.   On: 12/10/2014 17:13     EKG Interpretation   Date/Time:  Thursday December 10 2014 17:16:31 EST Ventricular Rate:  104 PR Interval:  103 QRS Duration: 150 QT Interval:  425 QTC Calculation: 559 R Axis:   -59 Text Interpretation:  Sinus or  ectopic atrial tachycardia Left bundle  branch block No significant change since last tracing Confirmed by [facility]   MD, DAVID ([id number]) on 12/10/2014 5:24:05 PM      MDM   Final diagnoses:  Shortness of breath   74 y/o F with h/o CAD and cardiac arrest 6 months ago here with chest pain and shortness of breath.  Physical exam as above.  Chest pain is atypical, but patient has significant risk factors for ACS.  Review of old medical records demonstrates L CX with critical lesion, but was unable to be stented.  Will require admission for ACS r/o unless she is found to have another cause for her chest pain.  Shortness of breath is concerning for PE.  However [place] of 1.5 so D-dimer should be sufficient to r/o PE.  Initial work-up included CBC, D-DIMER, BMP, EKG, CXR, and istat troponin.  EKG as detailed above.  CXR with no consolidations, no cough, or fevers, doubt PNA.  No evidence of PTX.  CBC, unremarkable, BMP unremarkable, BNP 42.8.  I stat troponin negative.  D-dimer 0.47 doubt PE.  I do not feel  patient requires a CTA at this time.  Patient admitted to cardiology for ACS rule out in a good condition.  Labs and imaging reviewed by myself and considered in medical decision making.  Imaging interpreted by Radiology.  Care discussed with my attending Dr. Preston Fleeting.     Bethann Berkshire, MD 12/11/14 1914  Dione Booze, MD 12/18/14 [phone]

## 2014-12-10 NOTE — H&P (Signed)
Referring Physician:  [name] is an 74 y.o. female.                       Chief Complaint: Chest pain  HPI: 74 year old female had an episode of chest pain while watching television. She describes a pressure and heavy feeling which she rated at 9/10. There is associated dyspnea but no nausea or diaphoresis. There is no radiation of pain. She did not have any nitroglycerin at home. She called for EMS who gave her aspirin and pain did go away. Pain lasted about 25 minutes. Of note, she is a survivor of out of hospital cardiac arrest last June. Cardiac catheterization at that time showed significant narrowing in a.m. small vessel that was not amenable to stent placement.  Past Medical History  Diagnosis Date  . Diabetes mellitus without complication   . Hypertension   . Chest pain   . Arrhythmia   . Left bundle branch block   . CAD (coronary artery disease)   . Hyperlipidemia   . Combined systolic and diastolic heart failure   . GERD (gastroesophageal reflux disease)   . Anemia   . Myocardial infarction       Past Surgical History  Procedure Laterality Date  . Abdominal hysterectomy    . Left and right heart catheterization with coronary angiogram N/A 05/26/2014    Procedure: LEFT AND RIGHT HEART CATHETERIZATION WITH CORONARY ANGIOGRAM;  Surgeon: Birdie Riddle, MD;  Location: [facility];  Service: Cardiovascular;  Laterality: N/A;    History reviewed. No pertinent family history. Social History:  reports that she has never smoked. She does not have any smokeless tobacco history on file. She reports that she drinks about 0.6 oz of alcohol per week. She reports that she does not use illicit drugs.  Allergies: No Active Allergies   (Not in a hospital admission)  Results for orders placed or performed during the hospital encounter of 12/10/14 (from the past 48 hour(s))  CBC     Status: None   Collection Time: 12/10/14  3:20 PM  Result Value Ref Range   WBC 6.4 4.0 - 10.5 K/uL    RBC 4.25 3.87 - 5.11 MIL/uL   Hemoglobin 12.9 12.0 - 15.0 g/dL   HCT 38.4 36.0 - 46.0 %   MCV 90.4 78.0 - 100.0 fL   MCH 30.4 26.0 - 34.0 pg   MCHC 33.6 30.0 - 36.0 g/dL   RDW 13.1 11.5 - 15.5 %   Platelets 265 150 - 400 K/uL  Basic metabolic panel     Status: Abnormal   Collection Time: 12/10/14  3:20 PM  Result Value Ref Range   Sodium 140 135 - 145 mmol/L    Comment: Please note change in reference range.   Potassium 4.2 3.5 - 5.1 mmol/L    Comment: Please note change in reference range.   Chloride 104 96 - 112 mEq/L   CO2 25 19 - 32 mmol/L   Glucose, Bld 158 (H) 70 - 99 mg/dL   BUN 16 6 - 23 mg/dL   Creatinine, Ser 0.91 0.50 - 1.10 mg/dL   Calcium 9.9 8.4 - 10.5 mg/dL   GFR calc non Af Amer 60 (L) >90 mL/min   GFR calc Af Amer 70 (L) >90 mL/min    Comment: (NOTE) The eGFR has been calculated using the CKD EPI equation. This calculation has not been validated in all clinical situations. eGFR's persistently <90 mL/min signify possible  Chronic Kidney Disease.    Anion gap 11 5 - 15  BNP (order ONLY if patient complains of dyspnea/SOB AND you have documented it for THIS visit)     Status: None   Collection Time: 12/10/14  3:20 PM  Result Value Ref Range   B Natriuretic Peptide 42.8 0.0 - 100.0 pg/mL    Comment: Please note change in reference range.  I-stat troponin, ED (not at [facility])     Status: None   Collection Time: 12/10/14  3:30 PM  Result Value Ref Range   Troponin i, poc 0.00 0.00 - 0.08 ng/mL   Comment 3            Comment: Due to the release kinetics of cTnI, a negative result within the first hours of the onset of symptoms does not rule out myocardial infarction with certainty. If myocardial infarction is still suspected, repeat the test at appropriate intervals.   D-dimer, quantitative     Status: None   Collection Time: 12/10/14  4:11 PM  Result Value Ref Range   D-Dimer, Quant 0.47 0.00 - 0.48 ug/mL-FEU    Comment:        AT THE INHOUSE ESTABLISHED  CUTOFF VALUE OF 0.48 ug/mL FEU, THIS ASSAY HAS BEEN DOCUMENTED IN THE LITERATURE TO HAVE A SENSITIVITY AND NEGATIVE PREDICTIVE VALUE OF AT LEAST 98 TO 99%.  THE TEST RESULT SHOULD BE CORRELATED WITH AN ASSESSMENT OF THE CLINICAL PROBABILITY OF DVT / VTE.    Dg Chest 2 View  12/10/2014   CLINICAL DATA:  Shortness of breath earlier today. Mid chest pain. History of myocardial infarction in 05/2014.  EXAM: CHEST  2 VIEW  COMPARISON:  04/09/2009; 01/01/2005; chest CT - 01/01/2005  FINDINGS: Grossly unchanged borderline enlarged cardiac silhouette and mediastinal contours with atherosclerotic plaque within the thoracic aorta. Overall improved aeration of the lungs. No focal airspace opacities. No pleural effusion or pneumothorax. There is minimal pleural parenchymal thickening about the right minor fissure. No evidence of edema. Stigmata of dish within the thoracic spine. No acute osseus abnormalities.  IMPRESSION: Borderline cardiomegaly without acute cardiopulmonary disease.   Electronically Signed   By: Sandi Mariscal M.D.   On: 12/10/2014 17:13    Review Of Systems Positive for shortness of breath and chest tightness. No fevers, chills, cough, nausea and vomiting, constipation, or diarrhea. No headaches, weight loss, night sweats. No sick contacts. No trauma history. No blurry vision, no chest pain or chest tightness. No dizziness, vertigo, or falls. Other review of systems are also negative.  Blood pressure 96/61, pulse 91, temperature 99.1 F (37.3 C), temperature source Oral, resp. rate 22, SpO2 99 %.  HEENT: [place]/AT, Eyes-Brown, PERL, EOMI, Conjunctiva- pink, Sclera-Non-icteric  Neck: No JVD, No bruit, Trachea midline.  Lungs: Clear, Bilateral.  Cardiac: Regular rhythm, normal S1 and S2, no S3. III/VI systolic murmur.  Abdomen: Soft, non-tender.  Extremities: No edema present. No cyanosis. No clubbing.  CNS: AxOx3, Cranial nerves grossly intact, moves all 4 extremities. Right  handed.  Skin: Warm and dry.  Assessment/Plan Chest pain r/o MI Hypertension  Diabetes mellitus, II  One vessel, native vessel, CAD   Admit R/o MI  [facility] S, MD  12/10/2014, 7:31 PM

## 2014-12-11 DIAGNOSIS — E119 Type 2 diabetes mellitus without complications: Secondary | ICD-10-CM | POA: Diagnosis not present

## 2014-12-11 DIAGNOSIS — R0789 Other chest pain: Secondary | ICD-10-CM | POA: Diagnosis not present

## 2014-12-11 DIAGNOSIS — I10 Essential (primary) hypertension: Secondary | ICD-10-CM | POA: Diagnosis not present

## 2014-12-11 DIAGNOSIS — R0602 Shortness of breath: Secondary | ICD-10-CM | POA: Diagnosis not present

## 2014-12-11 LAB — GLUCOSE, CAPILLARY
GLUCOSE-CAPILLARY: 123 mg/dL — AB (ref 70–99)
GLUCOSE-CAPILLARY: 72 mg/dL (ref 70–99)
Glucose-Capillary: 132 mg/dL — ABNORMAL HIGH (ref 70–99)
Glucose-Capillary: 114 mg/dL — ABNORMAL HIGH (ref 70–99)
Glucose-Capillary: 72 mg/dL (ref 70–99)

## 2014-12-11 LAB — CBC
HCT: 36 % (ref 36.0–46.0)
Hemoglobin: 11.7 g/dL — ABNORMAL LOW (ref 12.0–15.0)
MCH: 29.8 pg (ref 26.0–34.0)
MCHC: 32.5 g/dL (ref 30.0–36.0)
MCV: 91.6 fL (ref 78.0–100.0)
PLATELETS: 268 10*3/uL (ref 150–400)
RBC: 3.93 MIL/uL (ref 3.87–5.11)
RDW: 13.2 % (ref 11.5–15.5)
WBC: 5.9 10*3/uL (ref 4.0–10.5)

## 2014-12-11 LAB — TROPONIN I
Troponin I: <0.03 ng/mL (ref <0.031)
Troponin I: <0.03 ng/mL (ref <0.031)

## 2014-12-11 LAB — LIPID PANEL
Cholesterol: 199 mg/dL (ref 0–200)
HDL: 37 mg/dL — ABNORMAL LOW (ref >39)
LDL CALC: 128 mg/dL — AB (ref 0–99)
Total CHOL/HDL Ratio: 5.4 RATIO
Triglycerides: 172 mg/dL — ABNORMAL HIGH (ref <150)
VLDL: 34 mg/dL (ref 0–40)

## 2014-12-11 LAB — BASIC METABOLIC PANEL
Anion gap: 13 (ref 5–15)
BUN: 18 mg/dL (ref 6–23)
CO2: 20 mmol/L (ref 19–32)
Calcium: 8.7 mg/dL (ref 8.4–10.5)
Chloride: 105 mEq/L (ref 96–112)
Creatinine, Ser: 1.17 mg/dL — ABNORMAL HIGH (ref 0.50–1.10)
GFR calc Af Amer: 51 mL/min — ABNORMAL LOW (ref >90)
GFR calc non Af Amer: 44 mL/min — ABNORMAL LOW (ref >90)
Glucose, Bld: 77 mg/dL (ref 70–99)
Potassium: 3.7 mmol/L (ref 3.5–5.1)
SODIUM: 138 mmol/L (ref 135–145)

## 2014-12-11 LAB — PROTIME-INR
INR: 1.36 (ref 0.00–1.49)
Prothrombin Time: 16.9 seconds — ABNORMAL HIGH (ref 11.6–15.2)

## 2014-12-11 LAB — HEPARIN LEVEL (UNFRACTIONATED)
HEPARIN UNFRACTIONATED: 0.5 [IU]/mL (ref 0.30–0.70)
Heparin Unfractionated: 0.47 IU/mL (ref 0.30–0.70)

## 2014-12-11 LAB — HEMOGLOBIN A1C
HEMOGLOBIN A1C: 6.9 % — AB (ref <5.7)
MEAN PLASMA GLUCOSE: 151 mg/dL — AB (ref <117)

## 2014-12-11 MED ORDER — INFLUENZA VAC SPLIT QUAD 0.5 ML IM SUSY
0.5000 mL | PREFILLED_SYRINGE | INTRAMUSCULAR | Status: AC
  Administered 2014-12-12: 0.5 mL via INTRAMUSCULAR
  Filled 2014-12-11: qty 0.5

## 2014-12-11 NOTE — Progress Notes (Signed)
ANTICOAGULATION CONSULT NOTE - Follow Up Consult  Pharmacy Consult for heparin Indication: chest pain/ACS  Labs:  Recent Labs  12/10/14 1520 12/10/14 2057 12/11/14 0400 12/11/14 0534  HGB 12.9  --   --   --   HCT 38.4  --   --   --   PLT 265  --   --   --   LABPROT  --   --  16.9*  --   INR  --   --  1.36  --   HEPARINUNFRC  --   --   --  0.47  CREATININE 0.91  --  1.17*  --   TROPONINI  --  <0.03 <0.03  --     Assessment/Plan:  74yo female therapeutic on heparin with initial dosing for CP. Will continue gtt at current rate and confirm stable with additional level.   Vernard Gambles, PharmD, BCPS  12/11/2014,7:12 AM

## 2014-12-11 NOTE — Care Management (Signed)
Medicare Important Message given? YES   (If response is "NO", the following Medicare IM given date fields will be blank)   Date Medicare IM given:   Medicare IM given by: Graves-Bigelow, Natalee Tomkiewicz  

## 2014-12-11 NOTE — Progress Notes (Signed)
UR Completed Myan Locatelli Graves-Bigelow, RN,BSN [phone]  

## 2014-12-11 NOTE — Progress Notes (Signed)
  Echocardiogram 2D Echocardiogram has been performed.  Amanda Stewart 12/11/2014, 4:04 PM

## 2014-12-11 NOTE — Progress Notes (Signed)
Ref: [name], MD   Subjective:  Feeling better. Echocardiogram with improved LV systolic function with severe LVH, mild AS. Cardiac enzymes negative x 3. Good blood sugar control.  Objective:  Vital Signs in the last 24 hours: Temp:  [98.7 F (37.1 C)-99.3 F (37.4 C)] 99.3 F (37.4 C) (01/15 1500) Pulse Rate:  [73-81] 81 (01/15 1500) Cardiac Rhythm:  [-] Normal sinus rhythm (01/15 0835) Resp:  [16-19] 18 (01/15 1500) BP: (128-149)/(55-88) 128/55 mmHg (01/15 1500) SpO2:  [95 %-100 %] 99 % (01/15 1500) Weight:  [71.487 kg (157 lb 9.6 oz)] 71.487 kg (157 lb 9.6 oz) (01/15 0439)  Physical Exam: BP Readings from Last 1 Encounters:  12/11/14 128/55    Wt Readings from Last 1 Encounters:  12/11/14 71.487 kg (157 lb 9.6 oz)    Weight change:   HEENT: [place]/AT, Eyes-Brown, PERL, EOMI, Conjunctiva-Pink, Sclera-Non-icteric Neck: No JVD, No bruit, Trachea midline. Lungs:  Clear, Bilateral. Cardiac:  Regular rhythm, normal S1 and S2, no S3. III/VI systolic  Murmur. Abdomen:  Soft, non-tender. Extremities:  No edema present. No cyanosis. No clubbing. CNS: AxOx3, Cranial nerves grossly intact, moves all 4 extremities. Right handed. Skin: Warm and dry.   Intake/Output from previous day:      Lab Results: BMET    Component Value Date/Time   NA 138 12/11/2014 0400   NA 140 12/10/2014 1520   NA 140 05/31/2014 0410   K 3.7 12/11/2014 0400   K 4.2 12/10/2014 1520   K 3.6* 05/31/2014 0410   CL 105 12/11/2014 0400   CL 104 12/10/2014 1520   CL 99 05/31/2014 0410   CO2 20 12/11/2014 0400   CO2 25 12/10/2014 1520   CO2 24 05/31/2014 0410   GLUCOSE 77 12/11/2014 0400   GLUCOSE 158* 12/10/2014 1520   GLUCOSE 154* 05/31/2014 0410   BUN 18 12/11/2014 0400   BUN 16 12/10/2014 1520   BUN 18 05/31/2014 0410   CREATININE 1.17* 12/11/2014 0400   CREATININE 0.91 12/10/2014 1520   CREATININE 1.14* 05/31/2014 0410   CALCIUM 8.7 12/11/2014 0400   CALCIUM 9.9 12/10/2014 1520   CALCIUM 9.5 05/31/2014 0410   GFRNONAA 44* 12/11/2014 0400   GFRNONAA 60* 12/10/2014 1520   GFRNONAA 46* 05/31/2014 0410   GFRAA 51* 12/11/2014 0400   GFRAA 70* 12/10/2014 1520   GFRAA 54* 05/31/2014 0410   CBC    Component Value Date/Time   WBC 5.9 12/11/2014 0810   RBC 3.93 12/11/2014 0810   HGB 11.7* 12/11/2014 0810   HCT 36.0 12/11/2014 0810   PLT 268 12/11/2014 0810   MCV 91.6 12/11/2014 0810   MCH 29.8 12/11/2014 0810   MCHC 32.5 12/11/2014 0810   RDW 13.2 12/11/2014 0810   LYMPHSABS 2.2 05/29/2014 0500   MONOABS 1.1* 05/29/2014 0500   EOSABS 0.3 05/29/2014 0500   BASOSABS 0.1 05/29/2014 0500   HEPATIC Function Panel  Recent Labs  05/23/14 2310 05/25/14 0401  PROT 6.4 6.3   HEMOGLOBIN A1C No components found for: HGA1C,  MPG CARDIAC ENZYMES Lab Results  Component Value Date   CKTOTAL 185* 05/26/2014   CKMB <1.0 05/26/2014   TROPONINI <0.03 12/11/2014   TROPONINI <0.03 12/11/2014   TROPONINI <0.03 12/10/2014   BNP  Recent Labs  05/24/14 0632 05/25/14 0401  PROBNP 1419.0* 1748.0*   TSH No results for input(s): TSH in the last 8760 hours. CHOLESTEROL  Recent Labs  12/11/14 0534  CHOL 199    Scheduled Meds: . amLODipine  5  mg Oral BID  . aspirin  324 mg Oral NOW   Or  . aspirin  300 mg Rectal NOW  . aspirin EC  81 mg Oral Daily  . carvedilol  12.5 mg Oral BID WC  . glimepiride  2 mg Oral Daily  . insulin aspart  0-9 Units Subcutaneous TID WC  . linagliptin  5 mg Oral Daily  . lisinopril  5 mg Oral Daily  . metFORMIN  1,000 mg Oral BID WC  . potassium chloride  10 mEq Oral Daily   Continuous Infusions: . heparin 900 Units/hr (12/10/14 2045)   PRN Meds:.acetaminophen, nitroGLYCERIN, ondansetron (ZOFRAN) IV, phenol  Assessment/Plan: Chest pain r/o MI Hypertension  Diabetes mellitus, II  One vessel, native vessel, CAD   Nuclear stress test in AM.    LOS: 1 day    [name]  MD  12/11/2014, 6:46 PM

## 2014-12-11 NOTE — Progress Notes (Signed)
ANTICOAGULATION CONSULT NOTE - Follow Up Consult  Pharmacy Consult for heparin Indication: chest pain/ACS  No Known Allergies  Patient Measurements: Height: 5\' 6"  (167.6 cm) Weight: 157 lb 9.6 oz (71.487 kg) IBW/kg (Calculated) : 59.3 Heparin Dosing Weight: 71 kg  Vital Signs: Temp: 98.8 F (37.1 C) (01/15 0439) Temp Source: Oral (01/15 0439) BP: 149/88 mmHg (01/15 0826) Pulse Rate: 78 (01/15 0800)  Labs:  Recent Labs  12/10/14 1520 12/10/14 2057 12/11/14 0400 12/11/14 0534 12/11/14 0810 12/11/14 0947  HGB 12.9  --   --   --  11.7*  --   HCT 38.4  --   --   --  36.0  --   PLT 265  --   --   --  268  --   LABPROT  --   --  16.9*  --   --   --   INR  --   --  1.36  --   --   --   HEPARINUNFRC  --   --   --  0.47  --  0.50  CREATININE 0.91  --  1.17*  --   --   --   TROPONINI  --  <0.03 <0.03  --  <0.03  --     Estimated Creatinine Clearance: 42.1 mL/min (by C-G formula based on Cr of 1.17).   Medications:  Infusions:  . heparin 900 Units/hr (12/10/14 2045)    Assessment: 73 YOF seen here with chest pain with some dyspnea without nausea or diaphoresis. Noted she is a cardiac arrest survivor from last June- cath at that time show significant narrowing that was not amenable to stent.  Heparin level is therapeutic at 0.5. No bleeding noted, CBC is stable.  LDL is 128 in patient with CAD, no allergy to statins listed.  Goal of Therapy:  Heparin level 0.3-0.7 units/ml Monitor platelets by anticoagulation protocol: Yes   Plan:  - Continue heparin drip at 900 units/hr - Daily heparin level, CBC  - Monitor for s/sx of bleeding - Consider adding a statin to this patient's therapy plan  [facility], Pharm.D., BCPS Clinical Pharmacist Pager: [phone] 12/11/2014 10:59 AM

## 2014-12-12 ENCOUNTER — Observation Stay (HOSPITAL_COMMUNITY): Payer: Medicare HMO

## 2014-12-12 DIAGNOSIS — R0602 Shortness of breath: Secondary | ICD-10-CM | POA: Diagnosis not present

## 2014-12-12 DIAGNOSIS — R0789 Other chest pain: Secondary | ICD-10-CM | POA: Diagnosis not present

## 2014-12-12 DIAGNOSIS — I10 Essential (primary) hypertension: Secondary | ICD-10-CM | POA: Diagnosis not present

## 2014-12-12 DIAGNOSIS — E119 Type 2 diabetes mellitus without complications: Secondary | ICD-10-CM | POA: Diagnosis not present

## 2014-12-12 LAB — CBC
HCT: 35.7 % — ABNORMAL LOW (ref 36.0–46.0)
Hemoglobin: 12 g/dL (ref 12.0–15.0)
MCH: 31.2 pg (ref 26.0–34.0)
MCHC: 33.6 g/dL (ref 30.0–36.0)
MCV: 92.7 fL (ref 78.0–100.0)
Platelets: 258 10*3/uL (ref 150–400)
RBC: 3.85 MIL/uL — ABNORMAL LOW (ref 3.87–5.11)
RDW: 13.2 % (ref 11.5–15.5)
WBC: 5.7 10*3/uL (ref 4.0–10.5)

## 2014-12-12 LAB — GLUCOSE, CAPILLARY
Glucose-Capillary: 120 mg/dL — ABNORMAL HIGH (ref 70–99)
Glucose-Capillary: 143 mg/dL — ABNORMAL HIGH (ref 70–99)

## 2014-12-12 LAB — HEPARIN LEVEL (UNFRACTIONATED): Heparin Unfractionated: 0.65 IU/mL (ref 0.30–0.70)

## 2014-12-12 MED ORDER — TECHNETIUM TC 99M SESTAMIBI GENERIC - CARDIOLITE
10.0000 | Freq: Once | INTRAVENOUS | Status: AC | PRN
  Administered 2014-12-12: 10 via INTRAVENOUS

## 2014-12-12 MED ORDER — TECHNETIUM TC 99M SESTAMIBI GENERIC - CARDIOLITE
30.0000 | Freq: Once | INTRAVENOUS | Status: AC | PRN
  Administered 2014-12-12: 30 via INTRAVENOUS

## 2014-12-12 MED ORDER — REGADENOSON 0.4 MG/5ML IV SOLN
INTRAVENOUS | Status: AC
  Filled 2014-12-12: qty 5

## 2014-12-12 MED ORDER — REGADENOSON 0.4 MG/5ML IV SOLN
0.4000 mg | Freq: Once | INTRAVENOUS | Status: AC
  Administered 2014-12-12: 0.4 mg via INTRAVENOUS
  Filled 2014-12-12: qty 5

## 2014-12-12 MED ORDER — NITROGLYCERIN 0.4 MG SL SUBL: 0.4000 mg | SUBLINGUAL_TABLET | SUBLINGUAL | Status: DC | PRN

## 2014-12-12 NOTE — Discharge Summary (Signed)
Physician Discharge Summary  Patient ID: Amanda Stewart MRN: [id number] DOB/AGE: [birth year] 74 y.o.  Admit date: 12/10/2014 Discharge date: 12/12/2014  Admission Diagnoses: Chest pain r/o MI Hypertension  Diabetes mellitus, II  One vessel, native vessel, CAD   Discharge Diagnoses:  Principle Problem: * Chest pain at rest * Hypertension  Diabetes mellitus, II  One vessel, native vessel, CAD Mild aortic valve stenosis  Severe left ventricular hypertrophy First degree AV heart block  Discharged Condition: fair  Hospital Course: 74 year old female had an episode of chest pain while watching television. She described it as a pressure and heavy feeling which she rated at 9/10. There was associated dyspnea but no nausea or diaphoresis. There was no radiation of pain. She did not have any nitroglycerin at home. She called for EMS who gave her aspirin and pain did go away. Pain lasted about 25 minutes. Of note, she is a survivor of out of hospital cardiac arrest last June. Cardiac catheterization at that time showed significant narrowing in left circumflex artery, a small vessel that was not amenable to stent placement. Her cardiac enzymes were normal. Her nuclear stress test showed inferior wall defect without reversibility. Her medications were adjusted and she was discharged home in stable condition.   Consults: cardiology  Significant Diagnostic Studies: labs: Near normal CBC and BMET. HgA1C of 6.9  Nuclear medicine: 1. Non reversible decreased myocardial perfusion involving inferolateral wall of the LEFT ventricle new since previous exam.  2. Global hypokinesia of the LEFT ventricle. 3. Left ventricular ejection fraction 26 %. 4. Low -risk stress test findings.  Treatments: cardiac meds: Aspirin, lisinopril (Zestril), carvedilol, amlodipine and potassium.  Discharge Exam: Blood pressure 128/61, pulse 112, temperature 98.6 F (37 C), temperature source Oral, resp. rate 18, height   (1.676 m), weight 73.528 kg (162 lb 1.6 oz), SpO2 98 %. HEENT: [place]/AT, Eyes-Brown, PERL, EOMI, Conjunctiva-Pink, Sclera-Non-icteric Neck: No JVD, No bruit, Trachea midline. Lungs: Clear, Bilateral. Cardiac: Regular rhythm, normal S1 and S2, no S3. III/VI systolic Murmur. Abdomen: Soft, non-tender. Extremities: No edema present. No cyanosis. No clubbing. CNS: AxOx3, Cranial nerves grossly intact, moves all 4 extremities. Right handed. Skin: Warm and dry.  Disposition: 01-Home or self care.     Medication List    STOP taking these medications        hydrALAZINE 25 MG tablet  Commonly known as:  APRESOLINE      TAKE these medications        amLODipine 5 MG tablet  Commonly known as:  NORVASC  Take 1 tablet (5 mg total) by mouth 2 (two) times daily.     aspirin EC 81 MG tablet  Take 81 mg by mouth daily.     carvedilol 12.5 MG tablet  Commonly known as:  COREG  Take 12.5 mg by mouth 2 (two) times daily with a meal.     glimepiride 2 MG tablet  Commonly known as:  AMARYL  Take 2 mg by mouth daily.     linagliptin 5 MG Tabs tablet  Commonly known as:  TRADJENTA  Take 5 mg by mouth daily.     lisinopril 5 MG tablet  Commonly known as:  PRINIVIL,ZESTRIL  Take 1 tablet (5 mg total) by mouth daily.     metFORMIN 1000 MG tablet  Commonly known as:  GLUCOPHAGE  Take 1,000 mg by mouth 2 (two) times daily with a meal.     nitroGLYCERIN 0.4 MG SL tablet  Commonly known as:  NITROSTAT  Place 1 tablet (0.4 mg total) under the tongue every 5 (five) minutes x 3 doses as needed for chest pain.     phenol 1.4 % Liqd  Commonly known as:  CHLORASEPTIC  Use as directed 1 spray in the mouth or throat as needed for throat irritation / pain.     potassium chloride 10 MEQ tablet  Commonly known as:  K-DUR,KLOR-CON  Take 1 tablet (10 mEq total) by mouth daily.           Follow-up Information    Follow up with [facility] S, MD. Schedule an appointment as soon as  possible for a visit in 1 month.   Specialty:  Cardiology   Why:  follow up   Contact information:   [address] [place] [address] [phone]       Signed: [name] 12/12/2014, 2:49 PM

## 2014-12-12 NOTE — Progress Notes (Signed)
UR completed 

## 2014-12-12 NOTE — Progress Notes (Signed)
ANTICOAGULATION CONSULT NOTE - Follow Up Consult  Pharmacy Consult for Heparin Indication: chest pain/ACS  No Known Allergies  Patient Measurements: Height: 5\' 6"  (167.6 cm) Weight: 162 lb 1.6 oz (73.528 kg) IBW/kg (Calculated) : 59.3 Heparin Dosing Weight: 73kg  Vital Signs: Temp: 98.6 F (37 C) (01/16 0400) Temp Source: Oral (01/16 0400) BP: 129/62 mmHg (01/16 0400) Pulse Rate: 75 (01/16 0400)  Labs:  Recent Labs  12/10/14 1520 12/10/14 2057 12/11/14 0400 12/11/14 0534 12/11/14 0810 12/11/14 0947 12/12/14 0520  HGB 12.9  --   --   --  11.7*  --  12.0  HCT 38.4  --   --   --  36.0  --  35.7*  PLT 265  --   --   --  268  --  258  LABPROT  --   --  16.9*  --   --   --   --   INR  --   --  1.36  --   --   --   --   HEPARINUNFRC  --   --   --  0.47  --  0.50 0.65  CREATININE 0.91  --  1.17*  --   --   --   --   TROPONINI  --  <0.03 <0.03  --  <0.03  --   --     Estimated Creatinine Clearance: 42.6 mL/min (by C-G formula based on Cr of 1.17).   Medications:  Heparin @ 900 units/hr  Assessment: Amanda Stewart continues on heparin for chest pain with plans for nuclear stress today. Heparin level is therapeutic. CBC is stable. No bleeding reported.  Goal of Therapy:  Heparin level 0.3-0.7 units/ml Monitor platelets by anticoagulation protocol: Yes   Plan:  1) Continue heparin @ 900 units/hr 2) Follow up after stress test  Fredrik Rigger 12/12/2014,8:02 AM

## 2015-08-12 IMAGING — CR DG CHEST 1V PORT
1 series · 1 of 1 positions shown · non-contrast
Comparison: None.

CLINICAL DATA: Cardiac arrest.

EXAM:
PORTABLE CHEST - 1 VIEW

[AP]
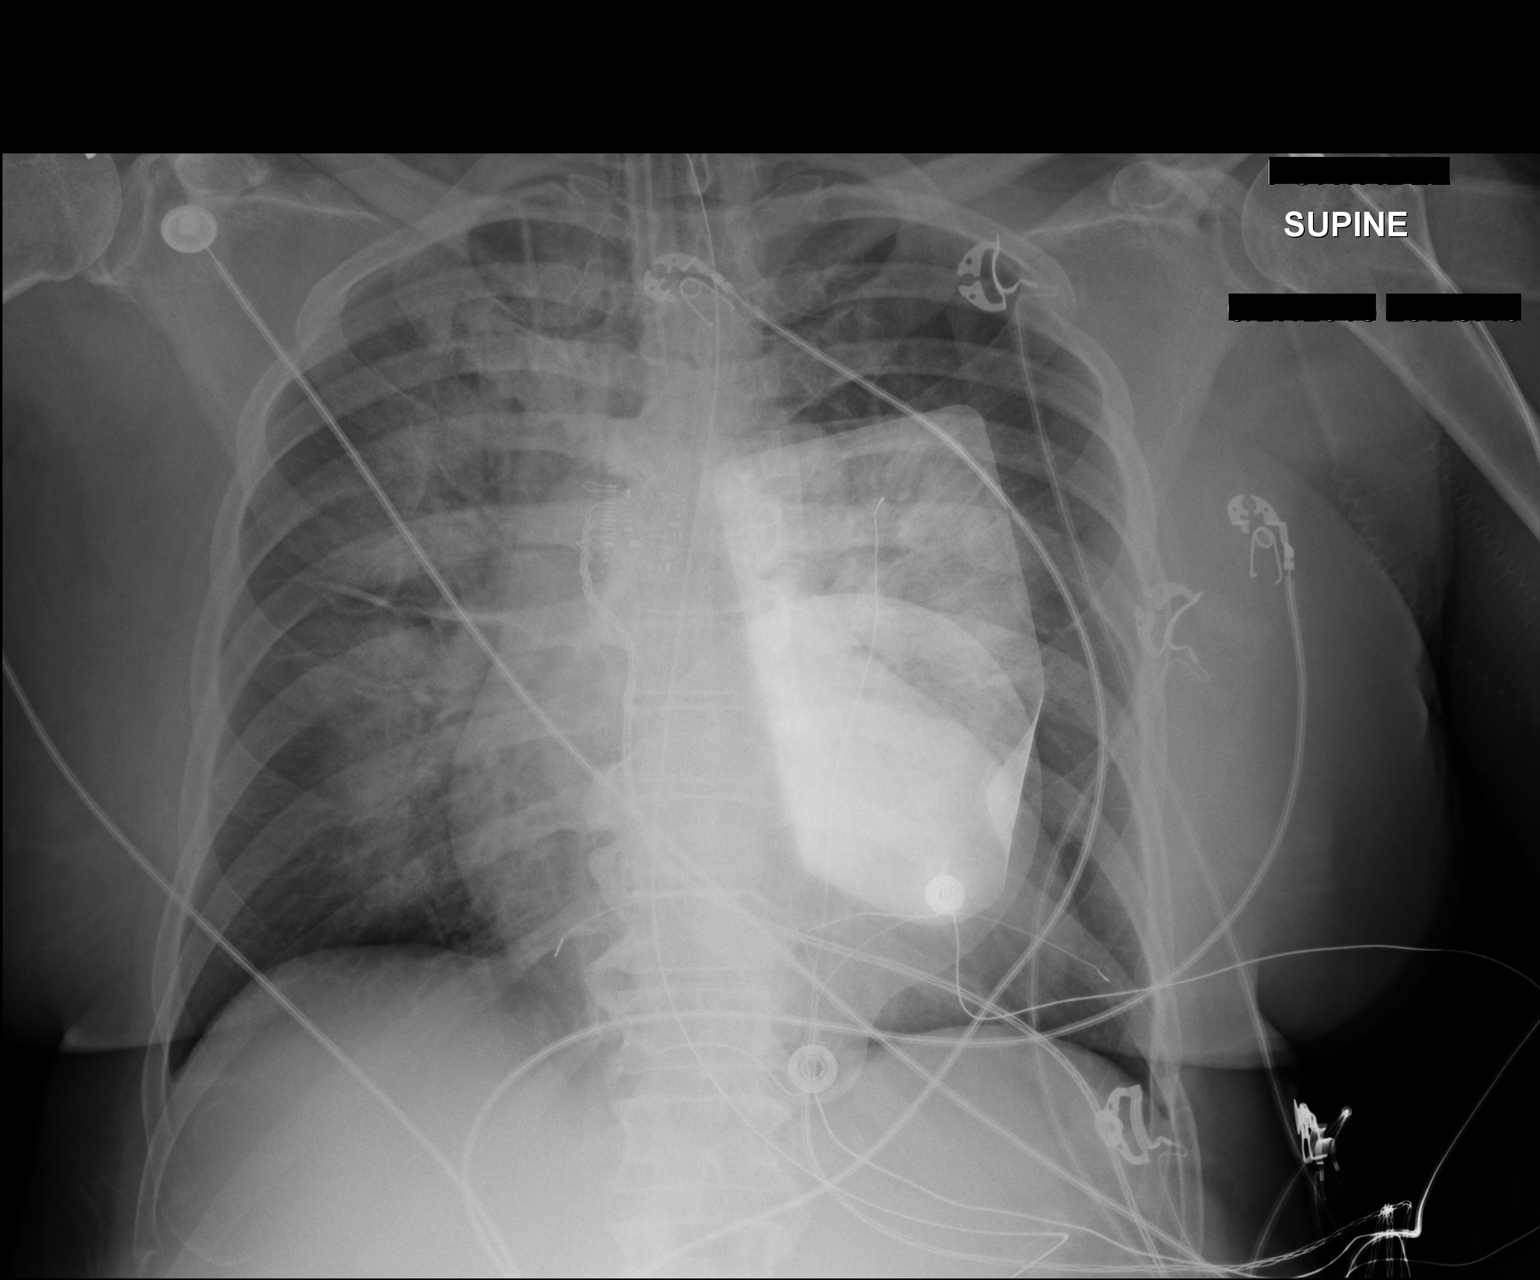

[1 of 1 positions shown; findings below may reference images not displayed]

FINDINGS: Endotracheal tube is in grossly good position with tip approximately
4 cm above the carina. Nasogastric tube is seen entering the
stomach. Defibrillator pads are seen. No pneumothorax or significant
pleural effusion is noted. Bilateral perihilar opacities are noted
most consistent with edema or less likely pneumonia. Bony thorax
appears intact.
IMPRESSION: Endotracheal tube in grossly good position. Bilateral perihilar
opacities are noted most consistent with edema or less likely
pneumonia.

## 2015-08-13 IMAGING — CR DG CHEST 1V PORT
2 series · 2 of 2 positions shown · non-contrast
Comparison: 05/23/2014.

CLINICAL DATA: Central line placement.  Concern for pneumothorax.

EXAM:
PORTABLE CHEST - 1 VIEW

[AP (1 of 2)]
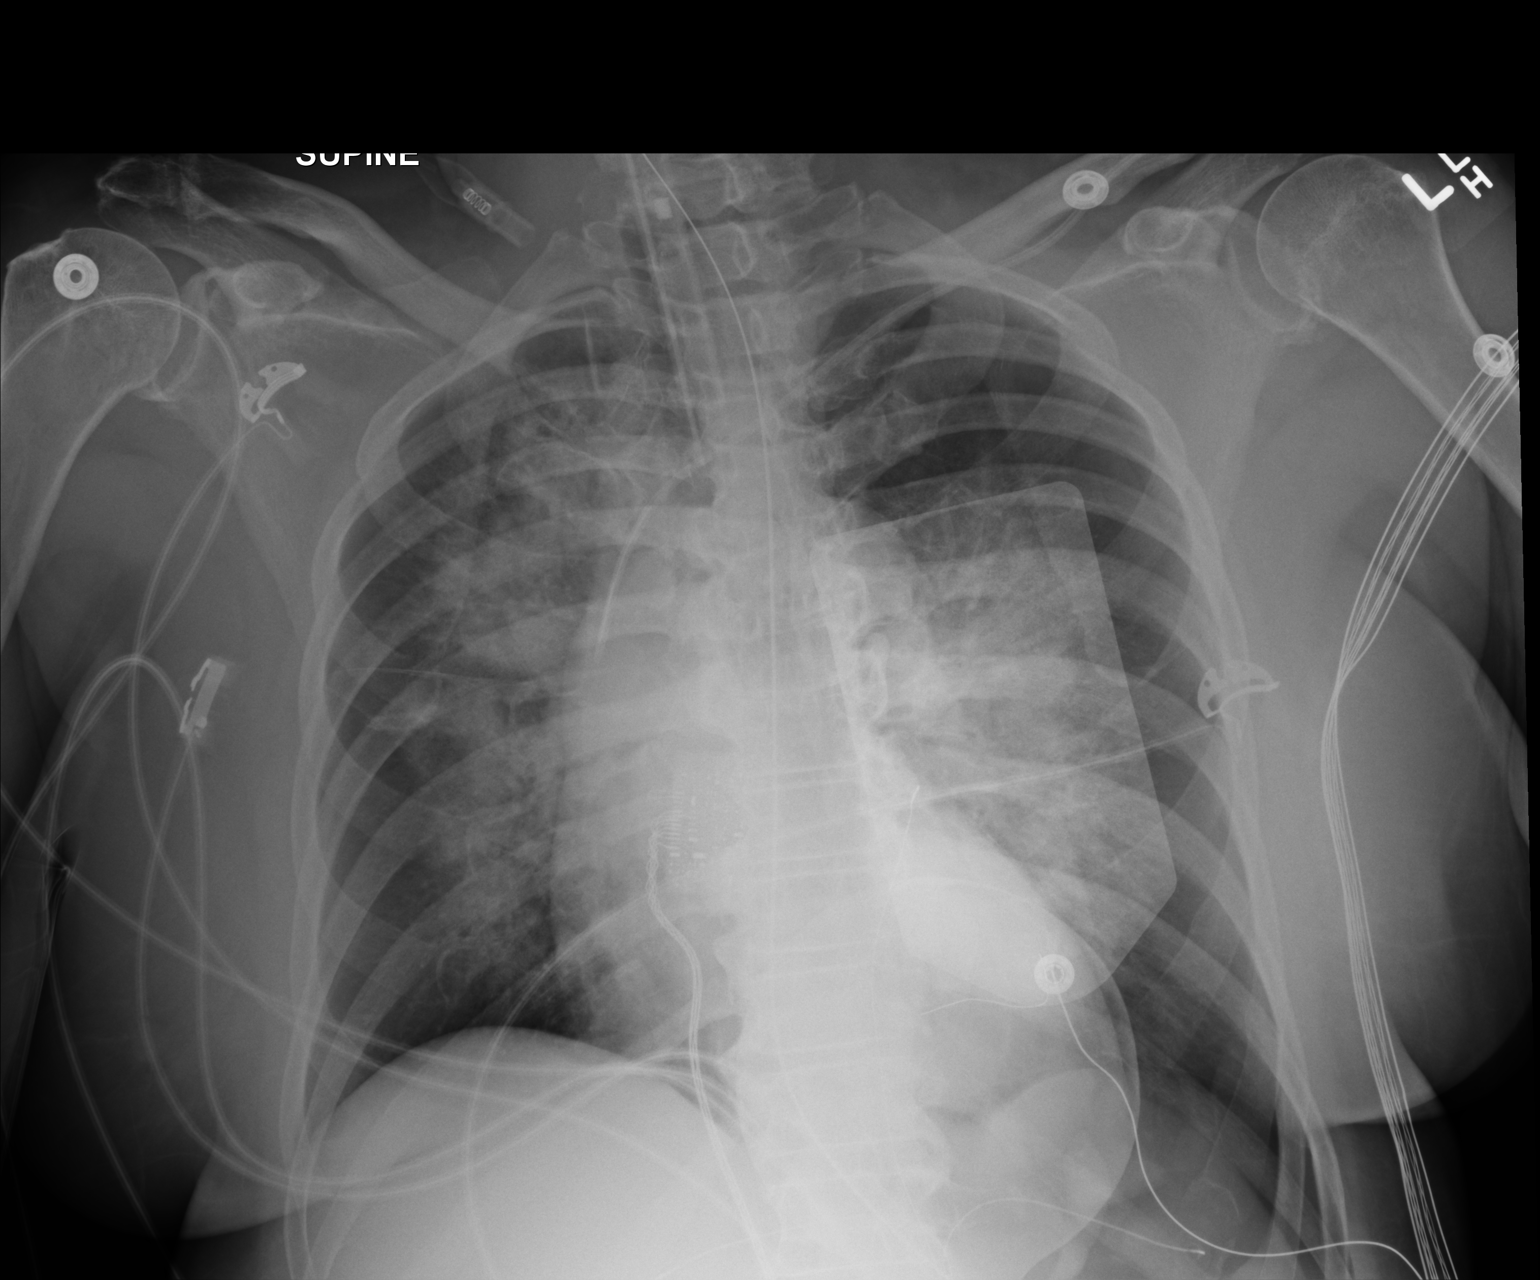

[AP (2 of 2)]
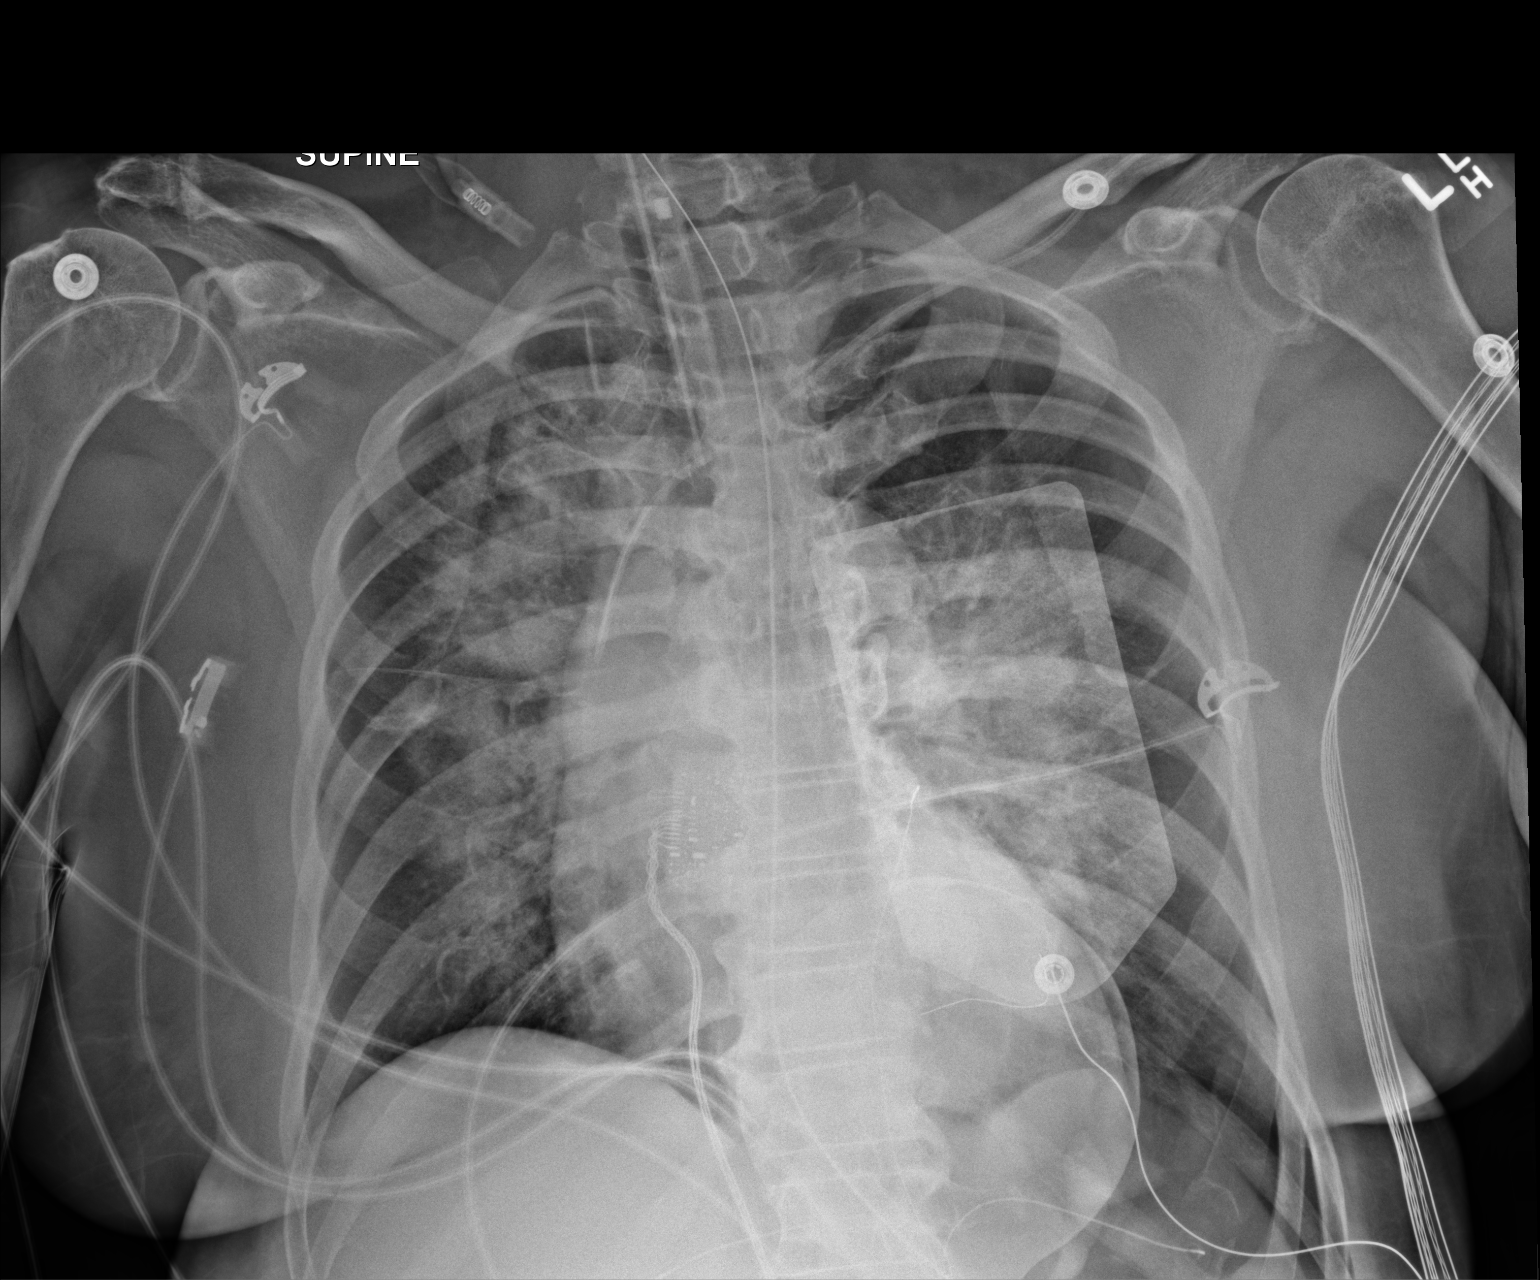

[2 of 2 positions shown; findings below may reference images not displayed]

FINDINGS: New left subclavian central line, tip at the level of the upper SVC.
Unchanged positioning of endotracheal and orogastric tubes, both in
good position.

There is an unexpected interface at the left apex and lateral upper
chest compatible with pneumothorax. Estimation of pneumothorax
volume is limited in the supine position. Ongoing perihilar airspace
disease, pattern favoring edema. Mild cardiomegaly is stable from
prior.

Critical Value/emergent results were called by telephone at the time
of interpretation on 05/24/2014 at [DATE] to RN May, who verbally
acknowledged these results and will relay to critical care fellow.
IMPRESSION: 1. Small left pneumothorax.
2. New left subclavian central line is in good position.
3. Persistent perihilar edema.

## 2015-08-15 IMAGING — CR DG CHEST 1V PORT
1 series · 1 of 1 positions shown · non-contrast
Comparison: 05/25/2014

CLINICAL DATA: Check endotracheal tube.  Shortness of breath

EXAM:
PORTABLE CHEST - 1 VIEW

[AP]
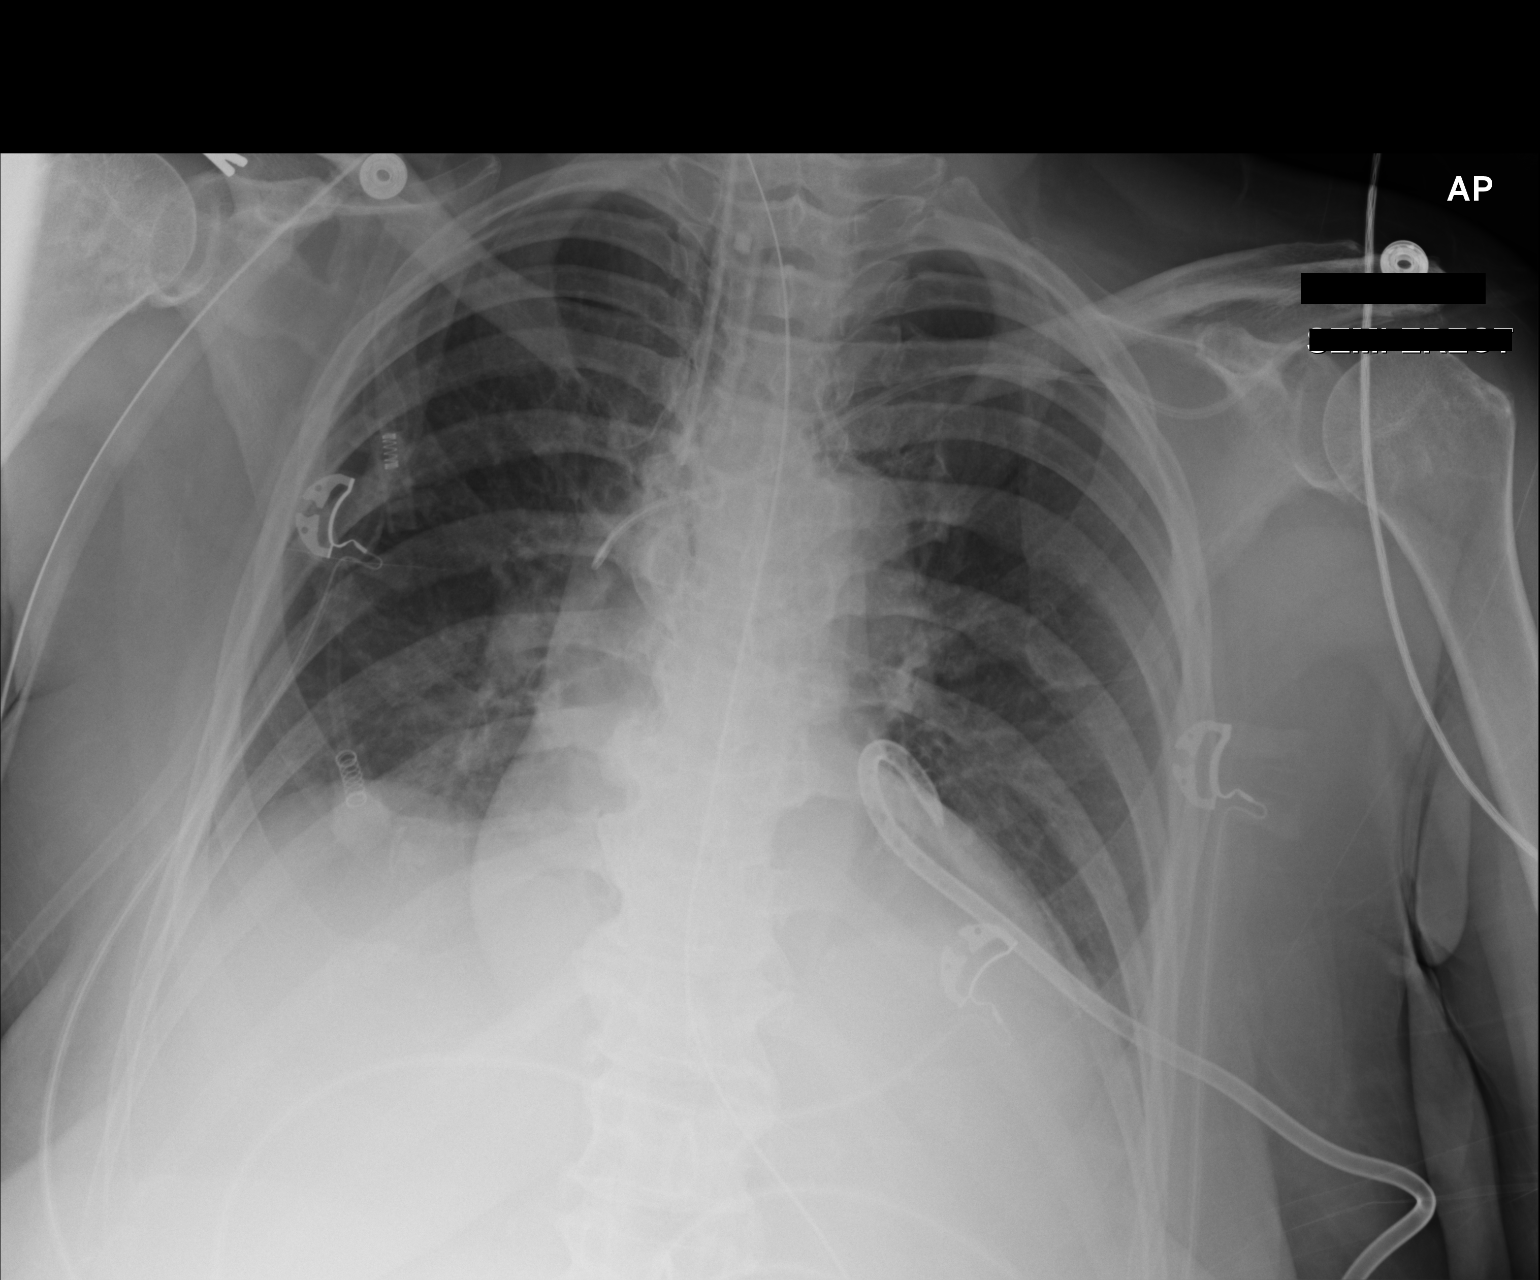

[1 of 1 positions shown; findings below may reference images not displayed]

FINDINGS: Endotracheal tube ends at the level of the clavicular heads. There
is a left subclavian central line, tip at the level of the upper
SVC. The orogastric tube reaches the stomach at least. There is a
small bore left-sided chest tube overlapping the base. Mild
hypoaeration. No consolidation, edema, effusion, or pneumothorax.
IMPRESSION: 1. Stable positioning of tubes and central line.
2. No residual pneumothorax.
3. Resolved pulmonary edema.

## 2015-08-16 IMAGING — CR DG CHEST 1V PORT
1 series · 1 of 1 positions shown · non-contrast
Comparison: 05/26/2014

CLINICAL DATA: Check endotracheal tube

EXAM:
PORTABLE CHEST - 1 VIEW

[AP]
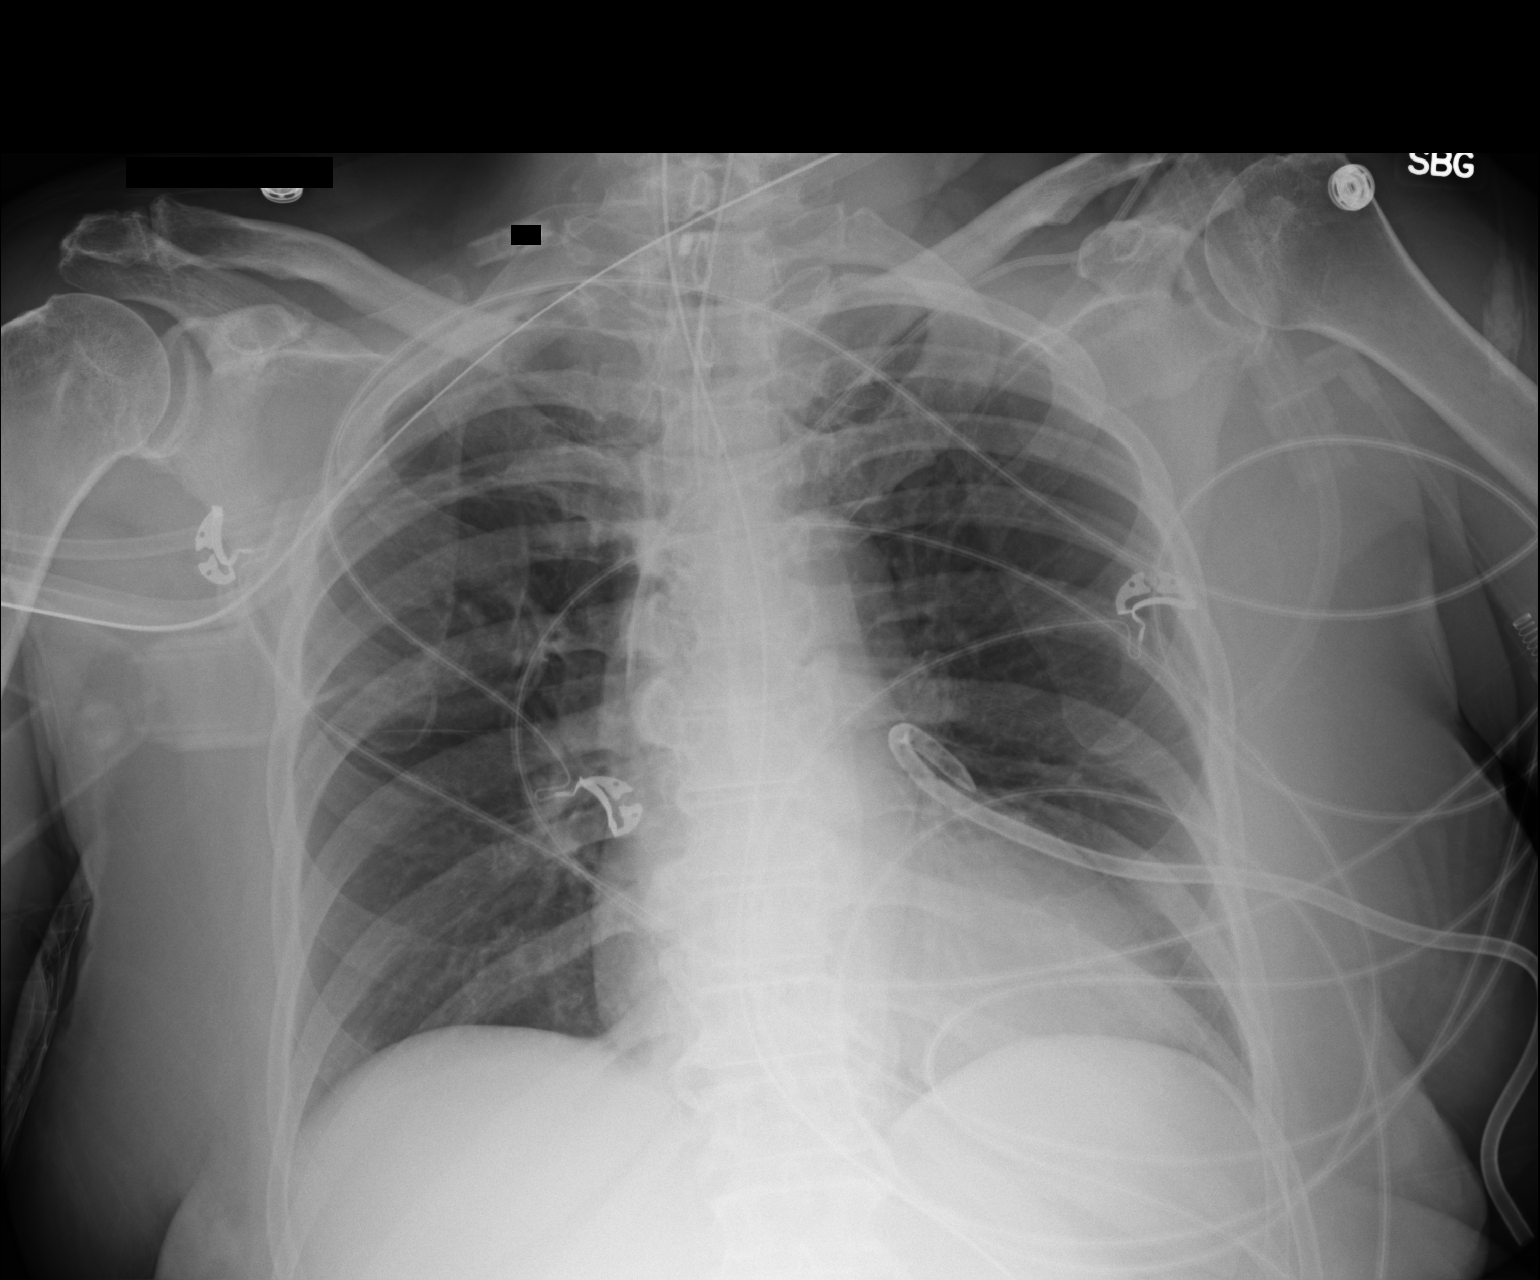

[1 of 1 positions shown; findings below may reference images not displayed]

FINDINGS: Endotracheal tube ends in the lower thoracic trachea, at least 4 cm
above the carina. A left subclavian central line is in stable
position, tip at the mid SVC. An orogastric tube crosses the
diaphragm. Left chest tube is in similar position.

Normal heart size and mediastinal contours. Basilar lung aeration is
improved. There is no consolidation or edema. No residual
pneumothorax.
IMPRESSION: 1. Tubes and central line remain in good position.
2. Normal lung aeration.  No residual left pneumothorax.

## 2015-11-04 ENCOUNTER — Inpatient Hospital Stay (HOSPITAL_COMMUNITY)
Admission: EM | Admit: 2015-11-04 | Discharge: 2015-11-07 | DRG: 558 | Disposition: A | Discharge status: Home / Self Care | Payer: Commercial Managed Care - HMO | Attending: Internal Medicine | Admitting: Internal Medicine

## 2015-11-04 ENCOUNTER — Encounter (HOSPITAL_COMMUNITY): Payer: Self-pay | Admitting: Emergency Medicine

## 2015-11-04 DIAGNOSIS — Z8674 Personal history of sudden cardiac arrest: Secondary | ICD-10-CM

## 2015-11-04 DIAGNOSIS — I447 Left bundle-branch block, unspecified: Secondary | ICD-10-CM | POA: Diagnosis present

## 2015-11-04 DIAGNOSIS — Z7984 Long term (current) use of oral hypoglycemic drugs: Secondary | ICD-10-CM

## 2015-11-04 DIAGNOSIS — K219 Gastro-esophageal reflux disease without esophagitis: Secondary | ICD-10-CM | POA: Diagnosis present

## 2015-11-04 DIAGNOSIS — I2510 Atherosclerotic heart disease of native coronary artery without angina pectoris: Secondary | ICD-10-CM | POA: Diagnosis present

## 2015-11-04 DIAGNOSIS — R740 Nonspecific elevation of levels of transaminase and lactic acid dehydrogenase [LDH]: Secondary | ICD-10-CM | POA: Diagnosis present

## 2015-11-04 DIAGNOSIS — R7401 Elevation of levels of liver transaminase levels: Secondary | ICD-10-CM

## 2015-11-04 DIAGNOSIS — Z7982 Long term (current) use of aspirin: Secondary | ICD-10-CM

## 2015-11-04 DIAGNOSIS — Z79899 Other long term (current) drug therapy: Secondary | ICD-10-CM

## 2015-11-04 DIAGNOSIS — D649 Anemia, unspecified: Secondary | ICD-10-CM | POA: Diagnosis present

## 2015-11-04 DIAGNOSIS — N390 Urinary tract infection, site not specified: Secondary | ICD-10-CM | POA: Diagnosis present

## 2015-11-04 DIAGNOSIS — E119 Type 2 diabetes mellitus without complications: Secondary | ICD-10-CM

## 2015-11-04 DIAGNOSIS — E875 Hyperkalemia: Secondary | ICD-10-CM | POA: Diagnosis not present

## 2015-11-04 DIAGNOSIS — E785 Hyperlipidemia, unspecified: Secondary | ICD-10-CM | POA: Diagnosis present

## 2015-11-04 DIAGNOSIS — I5042 Chronic combined systolic (congestive) and diastolic (congestive) heart failure: Secondary | ICD-10-CM | POA: Diagnosis present

## 2015-11-04 DIAGNOSIS — T50995A Adverse effect of other drugs, medicaments and biological substances, initial encounter: Secondary | ICD-10-CM | POA: Diagnosis present

## 2015-11-04 DIAGNOSIS — I252 Old myocardial infarction: Secondary | ICD-10-CM

## 2015-11-04 DIAGNOSIS — Z794 Long term (current) use of insulin: Secondary | ICD-10-CM

## 2015-11-04 DIAGNOSIS — T466X5A Adverse effect of antihyperlipidemic and antiarteriosclerotic drugs, initial encounter: Secondary | ICD-10-CM

## 2015-11-04 DIAGNOSIS — I10 Essential (primary) hypertension: Secondary | ICD-10-CM | POA: Diagnosis present

## 2015-11-04 DIAGNOSIS — M6282 Rhabdomyolysis: Secondary | ICD-10-CM | POA: Diagnosis not present

## 2015-11-04 LAB — CBC
HEMATOCRIT: 36.6 % (ref 36.0–46.0)
HEMOGLOBIN: 11.8 g/dL — AB (ref 12.0–15.0)
MCH: 30.3 pg (ref 26.0–34.0)
MCHC: 32.2 g/dL (ref 30.0–36.0)
MCV: 94.1 fL (ref 78.0–100.0)
Platelets: 246 10*3/uL (ref 150–400)
RBC: 3.89 MIL/uL (ref 3.87–5.11)
RDW: 13.2 % (ref 11.5–15.5)
WBC: 7.9 10*3/uL (ref 4.0–10.5)

## 2015-11-04 LAB — URINALYSIS, ROUTINE W REFLEX MICROSCOPIC
Bilirubin Urine: NEGATIVE
GLUCOSE, UA: NEGATIVE mg/dL
Ketones, ur: NEGATIVE mg/dL
Nitrite: NEGATIVE
Protein, ur: 30 mg/dL — AB
SPECIFIC GRAVITY, URINE: 1.019 (ref 1.005–1.030)
pH: 5 (ref 5.0–8.0)

## 2015-11-04 LAB — HEPATIC FUNCTION PANEL
ALBUMIN: 3.7 g/dL (ref 3.5–5.0)
ALT: 393 U/L — ABNORMAL HIGH (ref 14–54)
AST: 458 U/L — AB (ref 15–41)
Alkaline Phosphatase: 65 U/L (ref 38–126)
BILIRUBIN DIRECT: 0.2 mg/dL (ref 0.1–0.5)
BILIRUBIN TOTAL: 0.8 mg/dL (ref 0.3–1.2)
Indirect Bilirubin: 0.6 mg/dL (ref 0.3–0.9)
Total Protein: 7.1 g/dL (ref 6.5–8.1)

## 2015-11-04 LAB — BASIC METABOLIC PANEL
Anion gap: 9 (ref 5–15)
BUN: 24 mg/dL — AB (ref 6–20)
CO2: 25 mmol/L (ref 22–32)
CREATININE: 1 mg/dL (ref 0.44–1.00)
Calcium: 9.6 mg/dL (ref 8.9–10.3)
Chloride: 107 mmol/L (ref 101–111)
GFR calc Af Amer: >60 mL/min (ref >60)
GFR calc non Af Amer: 53 mL/min — ABNORMAL LOW (ref >60)
GLUCOSE: 219 mg/dL — AB (ref 65–99)
Potassium: 5.5 mmol/L — ABNORMAL HIGH (ref 3.5–5.1)
Sodium: 141 mmol/L (ref 135–145)

## 2015-11-04 LAB — CK TOTAL AND CKMB (NOT AT ARMC)
CK TOTAL: 23463 U/L — AB (ref 38–234)
CK, MB: 59.7 ng/mL — ABNORMAL HIGH (ref 0.5–5.0)
Relative Index: 0.3 (ref 0.0–2.5)

## 2015-11-04 LAB — URINE MICROSCOPIC-ADD ON

## 2015-11-04 LAB — POTASSIUM: Potassium: 4.7 mmol/L (ref 3.5–5.1)

## 2015-11-04 LAB — LIPASE, BLOOD: LIPASE: 46 U/L (ref 11–51)

## 2015-11-04 MED ORDER — ENOXAPARIN SODIUM 40 MG/0.4ML ~~LOC~~ SOLN
40.0000 mg | Freq: Every day | SUBCUTANEOUS | Status: DC
  Administered 2015-11-05 – 2015-11-07 (×3): 40 mg via SUBCUTANEOUS
  Filled 2015-11-04 (×3): qty 0.4

## 2015-11-04 MED ORDER — INSULIN ASPART 100 UNIT/ML ~~LOC~~ SOLN
0.0000 [IU] | Freq: Three times a day (TID) | SUBCUTANEOUS | Status: DC
  Administered 2015-11-05: 2 [IU] via SUBCUTANEOUS
  Administered 2015-11-05: 1 [IU] via SUBCUTANEOUS
  Administered 2015-11-05: 5 [IU] via SUBCUTANEOUS
  Administered 2015-11-06 (×2): 2 [IU] via SUBCUTANEOUS
  Administered 2015-11-06: 3 [IU] via SUBCUTANEOUS
  Administered 2015-11-07: 2 [IU] via SUBCUTANEOUS
  Administered 2015-11-07: 1 [IU] via SUBCUTANEOUS

## 2015-11-04 MED ORDER — DEXTROSE 50 % IV SOLN
1.0000 | Freq: Once | INTRAVENOUS | Status: AC
  Administered 2015-11-04: 50 mL via INTRAVENOUS
  Filled 2015-11-04: qty 50

## 2015-11-04 MED ORDER — SODIUM CHLORIDE 0.9 % IV BOLUS (SEPSIS)
1000.0000 mL | Freq: Once | INTRAVENOUS | Status: AC
  Administered 2015-11-04: 1000 mL via INTRAVENOUS

## 2015-11-04 MED ORDER — ONDANSETRON HCL 4 MG/2ML IJ SOLN: 4.0000 mg | Freq: Four times a day (QID) | INTRAMUSCULAR | Status: DC | PRN

## 2015-11-04 MED ORDER — AMLODIPINE BESYLATE 5 MG PO TABS
5.0000 mg | ORAL_TABLET | Freq: Two times a day (BID) | ORAL | Status: DC
  Administered 2015-11-04 – 2015-11-07 (×6): 5 mg via ORAL
  Filled 2015-11-04 (×6): qty 1

## 2015-11-04 MED ORDER — SODIUM CHLORIDE 0.9 % IV BOLUS (SEPSIS)
500.0000 mL | Freq: Once | INTRAVENOUS | Status: AC
  Administered 2015-11-04: 500 mL via INTRAVENOUS

## 2015-11-04 MED ORDER — ASPIRIN EC 81 MG PO TBEC
81.0000 mg | DELAYED_RELEASE_TABLET | Freq: Every day | ORAL | Status: DC
  Administered 2015-11-05 – 2015-11-07 (×3): 81 mg via ORAL
  Filled 2015-11-04 (×3): qty 1

## 2015-11-04 MED ORDER — SODIUM CHLORIDE 0.9 % IJ SOLN
3.0000 mL | Freq: Two times a day (BID) | INTRAMUSCULAR | Status: DC
  Administered 2015-11-05 – 2015-11-07 (×4): 3 mL via INTRAVENOUS

## 2015-11-04 MED ORDER — POLYETHYLENE GLYCOL 3350 17 G PO PACK: 17.0000 g | PACK | Freq: Every day | ORAL | Status: DC | PRN

## 2015-11-04 MED ORDER — SODIUM CHLORIDE 0.9 % IV SOLN
INTRAVENOUS | Status: DC
  Administered 2015-11-04 – 2015-11-07 (×5): via INTRAVENOUS

## 2015-11-04 MED ORDER — CARVEDILOL 12.5 MG PO TABS
12.5000 mg | ORAL_TABLET | Freq: Two times a day (BID) | ORAL | Status: DC
  Administered 2015-11-05 – 2015-11-07 (×5): 12.5 mg via ORAL
  Filled 2015-11-04 (×5): qty 1

## 2015-11-04 MED ORDER — ONDANSETRON HCL 4 MG PO TABS: 4.0000 mg | ORAL_TABLET | Freq: Four times a day (QID) | ORAL | Status: DC | PRN

## 2015-11-04 MED ORDER — INSULIN ASPART 100 UNIT/ML ~~LOC~~ SOLN
10.0000 [IU] | Freq: Once | SUBCUTANEOUS | Status: AC
Start: 2015-11-04 — End: 2015-11-04
  Administered 2015-11-04: 10 [IU] via SUBCUTANEOUS
  Filled 2015-11-04: qty 1

## 2015-11-04 NOTE — ED Notes (Signed)
Sent from Dr. Forestine Chute with positive CK, pt states has felt weak and muscles ache for two weeks since starting simvastatin. Stopped statin yesterday.

## 2015-11-04 NOTE — ED Notes (Signed)
HH meal tray ordered @ 1811

## 2015-11-04 NOTE — ED Notes (Signed)
Meal tray ordered 

## 2015-11-04 NOTE — ED Notes (Signed)
Attempted IV x 3 without success-- will consult IV team

## 2015-11-04 NOTE — Progress Notes (Signed)
Patient arrived on unit via stretcher with nurse tech. Patient alert and oriented x4. Patient oriented to staff, room and unit. Patient IV clean, dry and infusing. Patient placed on telemetry monitor, CCMD notified. Skin assessment completed, no skin issues noted. Patient rates pain 0/10. Safety Fall Prevention Plan was given, discussed and signed by patient. Orders have been reviewed and implemented. Will continue to monitor the patient. Call light has been placed within reach.  Rivka Barbara BSN, RN  Phone Number: [phone]

## 2015-11-04 NOTE — H&P (Signed)
[facility] History and Physical  Amanda Stewart QVZ:[id number] DOB: 1941-11-04 DOA: 11/04/2015  Referring physician: Emergency Department PCP: [name], MD   CHIEF COMPLAINT:  weakness    HPI: Amanda Stewart is a 74 y.o. female with a a PMH not limited to CAD. LBBB, HTN, and diabetes. She came to ED today after labs by PCP yesterday revealed an elevated CPK. Patient had not felt well for two weeks though denies specific complaints. She started a statin drug approximately two weeks ago. No other recent medications changes. She takes Vitamin D and a multiple vitamin along with prescribed home meds.   ED COURSE:    Labs:   CK 23463 AST 458  ALT 393  Urinalysis:  Large leukocytes, WBC TNTC    EKG:    11 [facility]/ED ROUTINE RECORD Sinus rhythm Atrial premature complexes Left bundle branch block 49mm/s 45mm/mV 100Hz  8.0 SP2 CID: [id number] Referred by: Unconfirmed Vent. rate 76 BPM PR interval 211 ms QRS duration 144 ms QT/QTc 439/494 ms P-R-T axes 30 -65 101 1941/07/27 (74   Medications  sodium chloride 0.9 % bolus 1,000 mL (not administered)  insulin aspart (novoLOG) injection 10 Units (not administered)  dextrose 50 % solution 50 mL (not administered)    Review of Systems  Constitutional: Positive for malaise/fatigue.  HENT: Negative.   Eyes: Negative.   Respiratory: Negative.   Cardiovascular: Negative.   Gastrointestinal: Negative.   Genitourinary: Negative.   Musculoskeletal: Negative.   Skin: Negative.   Neurological: Negative.   Endo/Heme/Allergies: Negative.   Psychiatric/Behavioral: Negative.     Past Medical History  Diagnosis Date  . Diabetes mellitus without complication (HCC)   . Hypertension   . Chest pain   . Arrhythmia   . Left bundle branch block   . CAD (coronary artery disease)   . Hyperlipidemia   . Combined systolic and diastolic heart failure (HCC)   . GERD (gastroesophageal reflux disease)   . Anemia   .  Myocardial infarction [facility])    Past Surgical History  Procedure Laterality Date  . Abdominal hysterectomy    . Left and right heart catheterization with coronary angiogram N/A 05/26/2014    Procedure: LEFT AND RIGHT HEART CATHETERIZATION WITH CORONARY ANGIOGRAM;  Surgeon: [name], MD;  [facility]ATH [facility];  Service: Cardiovascular;  Laterality: N/A;    SOCIAL HISTORY:  reports that she has never smoked. She does not have any smokeless tobacco history on file. She reports that she drinks about 0.6 oz of alcohol per week. She reports that she does not use illicit drugs. Lives:  Home alone   Assistive devices:   None needed for ambulation.   No Known Allergies  FMH: doesn't know family history   Prior to Admission medications   Medication Sig Start Date End Date Taking? Authorizing Provider  amLODipine (NORVASC) 5 MG tablet Take 1 tablet (5 mg total) by mouth 2 (two) times daily. 06/01/14  Yes [name], MD  aspirin EC 81 MG tablet Take 81 mg by mouth daily.   Yes Historical Provider, MD  carvedilol (COREG) 12.5 MG tablet Take 12.5 mg by mouth 2 (two) times daily with a meal.   Yes Historical Provider, MD  glimepiride (AMARYL) 2 MG tablet Take 2 mg by mouth daily. 11/13/14  Yes Historical Provider, MD  linagliptin (TRADJENTA) 5 MG TABS tablet Take 5 mg by mouth daily.   Yes Historical Provider, MD  lisinopril (PRINIVIL,ZESTRIL) 5 MG tablet Take 1 tablet (  5 mg total) by mouth daily. 06/01/14  Yes [name], MD  metFORMIN (GLUCOPHAGE) 1000 MG tablet Take 1,000 mg by mouth 2 (two) times daily with a meal.   Yes Historical Provider, MD  nitroGLYCERIN (NITROSTAT) 0.4 MG SL tablet Place 1 tablet (0.4 mg total) under the tongue every 5 (five) minutes x 3 doses as needed for chest pain. 12/12/14  Yes [name], MD  potassium chloride (K-DUR,KLOR-CON) 10 MEQ tablet Take 1 tablet (10 mEq total) by mouth daily. 06/01/14  Yes [name], MD   PHYSICAL EXAM: Filed Vitals:   11/04/15 1152    BP: 144/89  Pulse: 79  Temp: 98.9 F (37.2 C)  TempSrc: Oral  Resp: 16  SpO2: 100%    Wt Readings from Last 3 Encounters:  12/12/14 73.528 kg (162 lb 1.6 oz)  06/01/14 72 kg (158 lb 11.7 oz)    General:  Well developed black female. Appears calm and comfortable Eyes: PER, normal lids, irises & conjunctiva ENT: grossly normal hearing, lips & tongue Neck: no LAD, no masses Cardiovascular: RRR, no murmurs. No LE edema.  Respiratory: Respirations even and unlabored. Normal respiratory effort. Lungs CTA bilaterally, no wheezes / rales .   Abdomen: soft, non-distended, non-tender, active bowel sounds. No obvious masses.  Skin: no rash seen on limited exam Musculoskeletal: grossly normal tone BUE/BLE Psychiatric: grossly normal mood and affect, speech fluent and appropriate Neurologic: grossly non-focal.         LABS ON ADMISSION:    Basic Metabolic Panel:  Recent Labs Lab 11/04/15 1209  NA 141  K 5.5*  CL 107  CO2 25  GLUCOSE 219*  BUN 24*  CREATININE 1.00  CALCIUM 9.6   Liver Function Tests:  Recent Labs Lab 11/04/15 1209  AST 458*  ALT 393*  ALKPHOS 65  BILITOT 0.8  PROT 7.1  ALBUMIN 3.7    Recent Labs Lab 11/04/15 1209  LIPASE 46    CBC:  Recent Labs Lab 11/04/15 1209  WBC 7.9  HGB 11.8*  HCT 36.6  MCV 94.1  PLT 246    ASSESSMENT / PLAN    Rhabdomyolysis, suspect secondary to statin started two weeks ago. Renal function normal.  -admit to telemetry -IVF as soon as IV team can get access. Has one liter already ordered. Maintenance IVF for next 12 hours.  -repeat CK level in am.   Transaminitis. Likely related to Rhabo.  -am LFTs  Pyuria, asymptomatic. WBCs TNTC -send urine for culture  CAD, hx of cardiac arrest 2015. Admitted January 2016 with chest pain. Nuclear stress test revealed non-reversible myocardial perfusion involving inferolateral wall of LV, global hypokinesia of LV.   Hyperkalemia, k+ 5.5. Received insulin and  dextrose in ED.  -hold home K+  -recheck K+ this pm 6pm.   Hypertension. Stable -Continue home anti-hypertensives.   Diabetes mellitus, type 2.  -Hold home oral diabetic agents. -monitor CBGs, start SSI   CONSULTANTS:  none   Code Status: full DVT Prophylaxis: Lovenox  Family Communication:   Patient alert, oriented and understands plan of care.   Disposition Plan: Discharge to home in 24-48 hours   Time spent: 60 minutes Willette Cluster  NP [facility] Pager [phone]

## 2015-11-04 NOTE — ED Notes (Signed)
Patient reports several day history of general fatigue. Patient went to PCP yesterday and was told to come to ED for elevcated CPK. Patient denies any pain or any other symptoms. States she just feels weak and tired.

## 2015-11-04 NOTE — ED Provider Notes (Signed)
CSN: [id number]     Arrival date & time 11/04/15  1109 History   First MD Initiated Contact with Patient 11/04/15 1238     Chief Complaint  Patient presents with  . Fatigue  . Abnormal Lab     (Consider location/radiation/quality/duration/timing/severity/associated sxs/prior Treatment) HPI Comments: 2 weeks ago started statin After starting it 2 weeks ago began to develop severe fatigue, body aches, generalized weakness No CP/SOB/Abd pain/n/v/dysuria/fevers Had 2 tylenol yesterday Worsening fatigue PCP checked labs and found CK elevated   Past Medical History  Diagnosis Date  . Diabetes mellitus without complication (HCC)   . Hypertension   . Chest pain   . Arrhythmia   . Left bundle branch block   . CAD (coronary artery disease)   . Hyperlipidemia   . Combined systolic and diastolic heart failure (HCC)   . GERD (gastroesophageal reflux disease)   . Anemia   . Myocardial infarction [facility])    Past Surgical History  Procedure Laterality Date  . Abdominal hysterectomy    . Left and right heart catheterization with coronary angiogram N/A 05/26/2014    Procedure: LEFT AND RIGHT HEART CATHETERIZATION WITH CORONARY ANGIOGRAM;  Surgeon: [name], MD;  Location: [facility] CATH LAB;  Service: Cardiovascular;  Laterality: N/A;   History reviewed. No pertinent family history. Social History  Substance Use Topics  . Smoking status: Never Smoker   . Smokeless tobacco: None  . Alcohol Use: 0.6 oz/week    1 Glasses of wine per week   OB History    No data available     Review of Systems  Constitutional: Positive for fatigue. Negative for fever.  HENT: Negative for sore throat.   Eyes: Negative for visual disturbance.  Respiratory: Negative for cough and shortness of breath.   Cardiovascular: Negative for chest pain.  Gastrointestinal: Negative for nausea, vomiting and abdominal pain.  Genitourinary: Negative for difficulty urinating.  Musculoskeletal: Positive for myalgias.  Negative for back pain and neck pain.  Skin: Negative for rash.  Neurological: Negative for syncope and headaches.      Allergies  Review of patient's allergies indicates no known allergies.  Home Medications   Prior to Admission medications   Medication Sig Start Date End Date Taking? Authorizing Provider  amLODipine (NORVASC) 5 MG tablet Take 1 tablet (5 mg total) by mouth 2 (two) times daily. 06/01/14  Yes [name], MD  aspirin EC 81 MG tablet Take 81 mg by mouth daily.   Yes Historical Provider, MD  carvedilol (COREG) 12.5 MG tablet Take 12.5 mg by mouth 2 (two) times daily with a meal.   Yes Historical Provider, MD  glimepiride (AMARYL) 2 MG tablet Take 2 mg by mouth daily. 11/13/14  Yes Historical Provider, MD  linagliptin (TRADJENTA) 5 MG TABS tablet Take 5 mg by mouth daily.   Yes Historical Provider, MD  lisinopril (PRINIVIL,ZESTRIL) 5 MG tablet Take 1 tablet (5 mg total) by mouth daily. 06/01/14  Yes [name], MD  metFORMIN (GLUCOPHAGE) 1000 MG tablet Take 1,000 mg by mouth 2 (two) times daily with a meal.   Yes Historical Provider, MD  nitroGLYCERIN (NITROSTAT) 0.4 MG SL tablet Place 1 tablet (0.4 mg total) under the tongue every 5 (five) minutes x 3 doses as needed for chest pain. 12/12/14  Yes [name], MD  potassium chloride (K-DUR,KLOR-CON) 10 MEQ tablet Take 1 tablet (10 mEq total) by mouth daily. 06/01/14  Yes [name], MD   BP 160/77 mmHg  Pulse 81  Temp(Src) 98.6 F (37 C) (Oral)  Resp 16  Ht 5\' 5"  (1.651 m)  Wt 169 lb 6.4 oz (76.839 kg)  BMI 28.19 kg/m2  SpO2 99% Physical Exam  Constitutional: She is oriented to person, place, and time. She appears well-developed and well-nourished. No distress.  HENT:  Head: Normocephalic and atraumatic.  Eyes: Conjunctivae and EOM are normal.  Neck: Normal range of motion.  Cardiovascular: Normal rate, regular rhythm, normal heart sounds and intact distal pulses.  Exam reveals no gallop and no friction rub.   No  murmur heard. Pulmonary/Chest: Effort normal and breath sounds normal. No respiratory distress. She has no wheezes. She has no rales.  Abdominal: Soft. She exhibits no distension. There is no tenderness. There is no guarding.  Neg murphy's   Musculoskeletal: She exhibits no edema or tenderness.  Neurological: She is alert and oriented to person, place, and time.  Skin: Skin is warm and dry. No rash noted. She is not diaphoretic. No erythema.  Nursing note and vitals reviewed.   ED Course  Procedures (including critical care time) Labs Review Labs Reviewed  BASIC METABOLIC PANEL - Abnormal; Notable for the following:    Potassium 5.5 (*)    Glucose, Bld 219 (*)    BUN 24 (*)    GFR calc non Af Amer 53 (*)    All other components within normal limits  CBC - Abnormal; Notable for the following:    Hemoglobin 11.8 (*)    All other components within normal limits  URINALYSIS, ROUTINE W REFLEX MICROSCOPIC (NOT AT [facility]) - Abnormal; Notable for the following:    Color, Urine AMBER (*)    APPearance TURBID (*)    Hgb urine dipstick LARGE (*)    Protein, ur 30 (*)    Leukocytes, UA LARGE (*)    All other components within normal limits  CK TOTAL AND CKMB (NOT AT [facility]) - Abnormal; Notable for the following:    Total CK 23463 (*)    CK, MB 59.7 (*)    All other components within normal limits  HEPATIC FUNCTION PANEL - Abnormal; Notable for the following:    AST 458 (*)    ALT 393 (*)    All other components within normal limits  URINE MICROSCOPIC-ADD ON - Abnormal; Notable for the following:    Squamous Epithelial / LPF 0-5 (*)    Bacteria, UA MANY (*)    All other components within normal limits  URINE CULTURE  LIPASE, BLOOD  POTASSIUM  CK    Imaging Review No results found. I have personally reviewed and evaluated these images and lab results as part of my medical decision-making.   EKG Interpretation   Date/Time:  Thursday November 04 2015 14:11:11 EST Ventricular  Rate:  76 PR Interval:  211 QRS Duration: 144 QT Interval:  439 QTC Calculation: 494 R Axis:   -65 Text Interpretation:  Sinus rhythm Atrial premature complexes Left bundle  branch block No significant change since last tracing Confirmed by  [facility] MD, Corin Tilly ([id number]) on 11/04/2015 11:41:58 PM      MDM   Final diagnoses:  Non-traumatic rhabdomyolysis  Hyperkalemia  Transaminitis  Statin-induced rhabdomyolysis    74yo female with history of CAD, DM, presents with concern for fatigue after starting statin 2 weeks ago.  Labs concerning for transaminitis and rhabdomyolysis secondary to statin medication.  Abd exam benign, no sig tylenol ingestion and doubt obstructive etiology of transaminitis.  CK elevated above 20000,  No AKI  at this time. Mild hyperkalemia, given insulin/dextrose. No new ECG changes.  Given IVF and medicine called for admission for rhabdomyolysis.   Alvira Monday, MD 11/04/15 [phone]

## 2015-11-05 DIAGNOSIS — I10 Essential (primary) hypertension: Secondary | ICD-10-CM | POA: Diagnosis present

## 2015-11-05 DIAGNOSIS — E875 Hyperkalemia: Secondary | ICD-10-CM

## 2015-11-05 DIAGNOSIS — E119 Type 2 diabetes mellitus without complications: Secondary | ICD-10-CM | POA: Diagnosis not present

## 2015-11-05 DIAGNOSIS — N390 Urinary tract infection, site not specified: Secondary | ICD-10-CM | POA: Diagnosis present

## 2015-11-05 DIAGNOSIS — I2510 Atherosclerotic heart disease of native coronary artery without angina pectoris: Secondary | ICD-10-CM | POA: Diagnosis present

## 2015-11-05 DIAGNOSIS — R740 Nonspecific elevation of levels of transaminase and lactic acid dehydrogenase [LDH]: Secondary | ICD-10-CM

## 2015-11-05 DIAGNOSIS — Z79899 Other long term (current) drug therapy: Secondary | ICD-10-CM | POA: Diagnosis not present

## 2015-11-05 DIAGNOSIS — Z8674 Personal history of sudden cardiac arrest: Secondary | ICD-10-CM | POA: Diagnosis not present

## 2015-11-05 DIAGNOSIS — D649 Anemia, unspecified: Secondary | ICD-10-CM | POA: Diagnosis present

## 2015-11-05 DIAGNOSIS — I447 Left bundle-branch block, unspecified: Secondary | ICD-10-CM | POA: Diagnosis present

## 2015-11-05 DIAGNOSIS — T50995A Adverse effect of other drugs, medicaments and biological substances, initial encounter: Secondary | ICD-10-CM | POA: Diagnosis present

## 2015-11-05 DIAGNOSIS — Z7982 Long term (current) use of aspirin: Secondary | ICD-10-CM | POA: Diagnosis not present

## 2015-11-05 DIAGNOSIS — Z794 Long term (current) use of insulin: Secondary | ICD-10-CM

## 2015-11-05 DIAGNOSIS — E785 Hyperlipidemia, unspecified: Secondary | ICD-10-CM | POA: Diagnosis present

## 2015-11-05 DIAGNOSIS — I252 Old myocardial infarction: Secondary | ICD-10-CM | POA: Diagnosis not present

## 2015-11-05 DIAGNOSIS — M6282 Rhabdomyolysis: Secondary | ICD-10-CM | POA: Diagnosis not present

## 2015-11-05 DIAGNOSIS — Z7984 Long term (current) use of oral hypoglycemic drugs: Secondary | ICD-10-CM | POA: Diagnosis not present

## 2015-11-05 DIAGNOSIS — I5042 Chronic combined systolic (congestive) and diastolic (congestive) heart failure: Secondary | ICD-10-CM | POA: Diagnosis present

## 2015-11-05 DIAGNOSIS — K219 Gastro-esophageal reflux disease without esophagitis: Secondary | ICD-10-CM | POA: Diagnosis present

## 2015-11-05 LAB — GLUCOSE, CAPILLARY
GLUCOSE-CAPILLARY: 188 mg/dL — AB (ref 65–99)
Glucose-Capillary: 93 mg/dL (ref 65–99)
Glucose-Capillary: 256 mg/dL — ABNORMAL HIGH (ref 65–99)
Glucose-Capillary: 144 mg/dL — ABNORMAL HIGH (ref 65–99)
Glucose-Capillary: 166 mg/dL — ABNORMAL HIGH (ref 65–99)

## 2015-11-05 LAB — CK: Total CK: 20816 U/L — ABNORMAL HIGH (ref 38–234)

## 2015-11-05 MED ORDER — TRAMADOL HCL 50 MG PO TABS
50.0000 mg | ORAL_TABLET | Freq: Two times a day (BID) | ORAL | Status: DC | PRN
  Administered 2015-11-05: 50 mg via ORAL
  Filled 2015-11-05 (×2): qty 1

## 2015-11-05 NOTE — Progress Notes (Signed)
[facility] PROGRESS NOTE  Amanda Stewart:[id number] DOB: 01-Feb-1941 DOA: 11/04/2015 PCP: [name], MD  Assessment/Plan: 1. Acute rhabdomyolysis: - probably from the statin, normal renal parameters. Aggressive IV hydration and monitor CK levels.    2. Transaminitis: From the above.   Hyperkalemia: resolved. With hydration.    Hypertension: well controlled. Resume home meds.    Diabetes mellitus>  CBG (last 3)   Recent Labs  11/04/15 2244 11/05/15 0752 11/05/15 1156  GLUCAP 93 256* 144*    Resume SSI while in the hospital.   Abnormal UA, Asymptomatic: Urine cultures ordered and pending.    Code Status: full code Family Communication:none at bedside Disposition Plan: pending, PT eval.    Consultants:  none  Procedures:  none  Antibiotics:  none  HPI/Subjective: Comfortable, denies any new complaints.   Objective: Filed Vitals:   11/05/15 0613 11/05/15 0938  BP: 156/82 156/97  Pulse: 89 80  Temp: 98.7 F (37.1 C) 98.4 F (36.9 C)  Resp: 18 18    Intake/Output Summary (Last 24 hours) at 11/05/15 1328 Last data filed at 11/05/15 1125  Gross per 24 hour  Intake   2040 ml  Output    750 ml  Net   1290 ml   Filed Weights   11/04/15 2246  Weight: 76.839 kg (169 lb 6.4 oz)    Exam:   General:  Alert afebrile comfortable  Cardiovascular: s1s21  Respiratory: ctab,   Abdomen: soft no n tender non distended bowel sounds heard.   Musculoskeletal: no pedal edema.   Data Reviewed: Basic Metabolic Panel:  Recent Labs Lab 11/04/15 1209 11/04/15 1913  NA 141  --   K 5.5* 4.7  CL 107  --   CO2 25  --   GLUCOSE 219*  --   BUN 24*  --   CREATININE 1.00  --   CALCIUM 9.6  --    Liver Function Tests:  Recent Labs Lab 11/04/15 1209  AST 458*  ALT 393*  ALKPHOS 65  BILITOT 0.8  PROT 7.1  ALBUMIN 3.7    Recent Labs Lab 11/04/15 1209  LIPASE 46   No results for input(s): AMMONIA in the last 168  hours. CBC:  Recent Labs Lab 11/04/15 1209  WBC 7.9  HGB 11.8*  HCT 36.6  MCV 94.1  PLT 246   Cardiac Enzymes:  Recent Labs Lab 11/04/15 1209 11/05/15 0239  CKTOTAL 81191* 47829*  CKMB 59.7*  --    BNP (last 3 results)  Recent Labs  12/10/14 1520  BNP 42.8    ProBNP (last 3 results) No results for input(s): PROBNP in the last 8760 hours.  CBG:  Recent Labs Lab 11/04/15 2244 11/05/15 0752 11/05/15 1156  GLUCAP 93 256* 144*    Recent Results (from the past 240 hour(s))  Culture, Urine     Status: None (Preliminary result)   Collection Time: 11/04/15  2:20 PM  Result Value Ref Range Status   Specimen Description URINE, RANDOM  Final   Special Requests NONE  Final   Culture TOO YOUNG TO READ  Final   Report Status PENDING  Incomplete     Studies: No results found.  Scheduled Meds: . amLODipine  5 mg Oral BID  . aspirin EC  81 mg Oral Daily  . carvedilol  12.5 mg Oral BID WC  . enoxaparin (LOVENOX) injection  40 mg Subcutaneous Daily  . insulin aspart  0-9 Units Subcutaneous TID WC  . sodium chloride  3 mL Intravenous Q12H   Continuous Infusions: . sodium chloride 125 mL/hr at 11/04/15 2313    Principal Problem:   Rhabdomyolysis Active Problems:   Hyperkalemia   CAD (coronary artery disease)   Transaminitis   Diabetes mellitus, type 2 (HCC)    Time spent: 25 minutes.     [facility]  [facility] Pager [phone]. If 7PM-7AM, please contact night-coverage at [email or web address], password [facility] 11/05/2015, 1:28 PM  LOS: 1 day

## 2015-11-05 NOTE — Progress Notes (Signed)
Inpatient Diabetes Program Recommendations  AACE/ADA: New Consensus Statement on Inpatient Glycemic Control (2015)  Target Ranges:  Prepandial:   less than 140 mg/dL      Peak postprandial:   less than 180 mg/dL (1-2 hours)      Critically ill patients:  140 - 180 mg/dL   Review of Glycemic Control  Diabetes history: DM 2 Outpatient Diabetes medications: Amaryl 2 mg Daily, Tradjenta 56 mg Daily, Metformin 1,000 mg BID Current orders for Inpatient glycemic control: Novolog Sensitive TID  Inpatient Diabetes Program Recommendations: Insulin - Basal: Patient takes 3 oral medications to control glucose at home. Fasting glucose is 256 mg/dl. Please consider starting low dose basal insulin while inpatient, Levemir 10 units Q24hrs.  Thanks,  Christena Deem RN, MSN, [facility] Inpatient Diabetes Coordinator Team Pager [phone] (8a-5p)

## 2015-11-06 LAB — URINE CULTURE

## 2015-11-06 LAB — GLUCOSE, CAPILLARY
GLUCOSE-CAPILLARY: 228 mg/dL — AB (ref 65–99)
GLUCOSE-CAPILLARY: 185 mg/dL — AB (ref 65–99)
Glucose-Capillary: 123 mg/dL — ABNORMAL HIGH (ref 65–99)

## 2015-11-06 LAB — CK: CK TOTAL: 16786 U/L — AB (ref 38–234)

## 2015-11-06 MED ORDER — CIPROFLOXACIN HCL 500 MG PO TABS
500.0000 mg | ORAL_TABLET | Freq: Two times a day (BID) | ORAL | Status: DC
  Administered 2015-11-06 – 2015-11-07 (×2): 500 mg via ORAL
  Filled 2015-11-06 (×2): qty 1

## 2015-11-06 NOTE — Progress Notes (Signed)
[facility] PROGRESS NOTE  Amanda Stewart JKK:[id number] DOB: [birth year] DOA: 11/04/2015 PCP: [name], MD  Assessment/Plan: 1. Acute rhabdomyolysis: - probably from the statin, normal renal parameters. Aggressive IV hydration and monitor CK levels.    2. Transaminitis: From the above.   Hyperkalemia: resolved. With hydration.    Hypertension: well controlled. Resume home meds.    Diabetes mellitus>  CBG (last 3)   Recent Labs  11/05/15 1658 11/05/15 2004 11/06/15 0759  GLUCAP 188* 166* 228*    Resume SSI while in the hospital.   Abnormal UA, Asymptomatic: Urine cultures ordered and showed klebsiella Ordered cipro   Code Status: full code Family Communication:none at bedside Disposition Plan: pending, PT eval.    Consultants:  none  Procedures:  none  Antibiotics:  none  HPI/Subjective: Comfortable, denies any new complaints.   Objective: Filed Vitals:   11/06/15 0501 11/06/15 0906  BP: 152/73 135/66  Pulse: 93 87  Temp: 98.6 F (37 C) 99.1 F (37.3 C)  Resp: 18 18    Intake/Output Summary (Last 24 hours) at 11/06/15 1736 Last data filed at 11/06/15 1608  Gross per 24 hour  Intake    480 ml  Output   3175 ml  Net  -2695 ml   Filed Weights   11/04/15 2246 11/05/15 2005  Weight: 76.839 kg (169 lb 6.4 oz) 77.565 kg (171 lb)    Exam:   General:  Alert afebrile comfortable  Cardiovascular: s1s21  Respiratory: ctab,   Abdomen: soft no n tender non distended bowel sounds heard.   Musculoskeletal: no pedal edema.   Data Reviewed: Basic Metabolic Panel:  Recent Labs Lab 11/04/15 1209 11/04/15 1913  NA 141  --   K 5.5* 4.7  CL 107  --   CO2 25  --   GLUCOSE 219*  --   BUN 24*  --   CREATININE 1.00  --   CALCIUM 9.6  --    Liver Function Tests:  Recent Labs Lab 11/04/15 1209  AST 458*  ALT 393*  ALKPHOS 65  BILITOT 0.8  PROT 7.1  ALBUMIN 3.7    Recent Labs Lab 11/04/15 1209  LIPASE 46   No  results for input(s): AMMONIA in the last 168 hours. CBC:  Recent Labs Lab 11/04/15 1209  WBC 7.9  HGB 11.8*  HCT 36.6  MCV 94.1  PLT 246   Cardiac Enzymes:  Recent Labs Lab 11/04/15 1209 11/05/15 0239 11/06/15 0535  CKTOTAL 71696* 78938* 10175*  CKMB 59.7*  --   --    BNP (last 3 results)  Recent Labs  12/10/14 1520  BNP 42.8    ProBNP (last 3 results) No results for input(s): PROBNP in the last 8760 hours.  CBG:  Recent Labs Lab 11/05/15 0752 11/05/15 1156 11/05/15 1658 11/05/15 2004 11/06/15 0759  GLUCAP 256* 144* 188* 166* 228*    Recent Results (from the past 240 hour(s))  Culture, Urine     Status: None   Collection Time: 11/04/15  2:20 PM  Result Value Ref Range Status   Specimen Description URINE, RANDOM  Final   Special Requests NONE  Final   Culture >=100,000 COLONIES/mL KLEBSIELLA PNEUMONIAE  Final   Report Status 11/06/2015 FINAL  Final   Organism ID, Bacteria KLEBSIELLA PNEUMONIAE  Final      Susceptibility   Klebsiella pneumoniae - MIC*    AMPICILLIN >=32 RESISTANT Resistant     CEFAZOLIN <=4 SENSITIVE Sensitive     CEFTRIAXONE <=1 SENSITIVE  Sensitive     CIPROFLOXACIN <=0.25 SENSITIVE Sensitive     GENTAMICIN <=1 SENSITIVE Sensitive     IMIPENEM <=0.25 SENSITIVE Sensitive     NITROFURANTOIN 64 INTERMEDIATE Intermediate     TRIMETH/SULFA <=20 SENSITIVE Sensitive     AMPICILLIN/SULBACTAM 4 SENSITIVE Sensitive     PIP/TAZO <=4 SENSITIVE Sensitive     * >=100,000 COLONIES/mL KLEBSIELLA PNEUMONIAE     Studies: No results found.  Scheduled Meds: . amLODipine  5 mg Oral BID  . aspirin EC  81 mg Oral Daily  . carvedilol  12.5 mg Oral BID WC  . enoxaparin (LOVENOX) injection  40 mg Subcutaneous Daily  . insulin aspart  0-9 Units Subcutaneous TID WC  . sodium chloride  3 mL Intravenous Q12H   Continuous Infusions: . sodium chloride 125 mL/hr at 11/06/15 1610    Principal Problem:   Rhabdomyolysis Active Problems:    Hyperkalemia   CAD (coronary artery disease)   Transaminitis   Diabetes mellitus, type 2 (HCC)    Time spent: 25 minutes.     [facility]  [facility] Pager [phone]. If 7PM-7AM, please contact night-coverage at [email or web address], password [facility] 11/06/2015, 5:36 PM  LOS: 2 days

## 2015-11-07 LAB — BASIC METABOLIC PANEL
ANION GAP: 6 (ref 5–15)
BUN: 10 mg/dL (ref 6–20)
CALCIUM: 8.6 mg/dL — AB (ref 8.9–10.3)
CO2: 26 mmol/L (ref 22–32)
Chloride: 107 mmol/L (ref 101–111)
Creatinine, Ser: 0.69 mg/dL (ref 0.44–1.00)
GFR calc Af Amer: >60 mL/min (ref >60)
GLUCOSE: 180 mg/dL — AB (ref 65–99)
Potassium: 4.1 mmol/L (ref 3.5–5.1)
SODIUM: 139 mmol/L (ref 135–145)

## 2015-11-07 LAB — CK: CK TOTAL: 12011 U/L — AB (ref 38–234)

## 2015-11-07 LAB — GLUCOSE, CAPILLARY
GLUCOSE-CAPILLARY: 173 mg/dL — AB (ref 65–99)
Glucose-Capillary: 166 mg/dL — ABNORMAL HIGH (ref 65–99)

## 2015-11-07 MED ORDER — CIPROFLOXACIN HCL 500 MG PO TABS: 500.0000 mg | ORAL_TABLET | Freq: Two times a day (BID) | ORAL | Status: DC

## 2015-11-07 NOTE — Care Management (Addendum)
Patient being discharged home in stable condition, with family. No home health services needed, as per patient  She denies any difficulty obtaining month me. dication. No CM needs identified.

## 2015-11-07 NOTE — Care Management Important Message (Signed)
Important Message  Patient Details  Name: Amanda Stewart MRN: [id number] Date of Birth: [birth year]   Medicare Important Message Given:  Yes    Michel Bickers, RN 11/07/2015, 12:17 PM

## 2015-11-08 LAB — GLUCOSE, CAPILLARY: GLUCOSE-CAPILLARY: 152 mg/dL — AB (ref 65–99)

## 2015-11-08 NOTE — Discharge Summary (Signed)
Physician Discharge Summary  Amanda Stewart UEA:[id number] DOB: [birth year] DOA: 11/04/2015  PCP: [name], MD  Admit date: 11/04/2015 Discharge date: 11/07/2015  Time spent: 30 minutes  Recommendations for Outpatient Follow-up:  Follow up with PCP in one week, check CK levels Please check liver function panel in 2 to4 weeks.    Discharge Diagnoses:  Principal Problem:   Rhabdomyolysis Active Problems:   Hyperkalemia   CAD (coronary artery disease)   Transaminitis   Diabetes mellitus, type 2 (HCC)   Discharge Condition: improved.   Diet recommendation: regular   Filed Weights   11/04/15 2246 11/05/15 2005  Weight: 76.839 kg (169 lb 6.4 oz) 77.565 kg (171 lb)    History of present illness:   Amanda Stewart is a 74 y.o. female with a a PMH not limited to CAD. LBBB, HTN, and diabetes. She came to ED today after labs by PCP yesterday revealed an elevated CPK.  Hospital Course:  1. Acute rhabdomyolysis: - probably from the statin, normal renal parameters. Aggressive IV hydration and monitor CK levels.    2. Transaminitis: From the above. Recheck liver panel in 4 weeks.   Hyperkalemia: resolved. With hydration.    Hypertension: well controlled. Resume home meds.    Diabetes mellitus>  CBG (last 3)   Recent Labs (last 2 labs)      Recent Labs  11/05/15 1658 11/05/15 2004 11/06/15 0759  GLUCAP 188* 166* 228*      Resume SSI while in the hospital.   Abnormal UA, Asymptomatic: Urine cultures ordered and showed klebsiella Ordered cipro to complete the course.        Procedures:  None   Consultations:  none Discharge Exam: Filed Vitals:   11/07/15 0448 11/07/15 0900  BP: 127/84 135/77  Pulse: 86 89  Temp: 98.5 F (36.9 C) 97.2 F (36.2 C)  Resp: 17 18    General: alert afebrile comfortable Cardiovascular: s1s2 Respiratory: ctab  Discharge Instructions   Discharge Instructions    Diet - low sodium heart healthy    Complete by:   As directed      Discharge instructions    Complete by:  As directed   FOLLOW UP with PCP in one week, please obtain liver function panel  And repeat CK and BMP to check the levels and electrolytes.          Discharge Medication List as of 11/07/2015  2:21 PM    START taking these medications   Details  ciprofloxacin (CIPRO) 500 MG tablet Take 1 tablet (500 mg total) by mouth 2 (two) times daily., Starting 11/07/2015, Until Discontinued, Print      CONTINUE these medications which have NOT CHANGED   Details  amLODipine (NORVASC) 5 MG tablet Take 1 tablet (5 mg total) by mouth 2 (two) times daily., Starting 06/01/2014, Until Discontinued, Normal    aspirin EC 81 MG tablet Take 81 mg by mouth daily., Until Discontinued, Historical Med    carvedilol (COREG) 12.5 MG tablet Take 12.5 mg by mouth 2 (two) times daily with a meal., Until Discontinued, Historical Med    glimepiride (AMARYL) 2 MG tablet Take 2 mg by mouth daily., Starting 11/13/2014, Until Discontinued, Historical Med    linagliptin (TRADJENTA) 5 MG TABS tablet Take 5 mg by mouth daily., Until Discontinued, Historical Med    lisinopril (PRINIVIL,ZESTRIL) 5 MG tablet Take 1 tablet (5 mg total) by mouth daily., Starting 06/01/2014, Until Discontinued, Normal    metFORMIN (GLUCOPHAGE) 1000 MG tablet Take  1,000 mg by mouth 2 (two) times daily with a meal., Until Discontinued, Historical Med    nitroGLYCERIN (NITROSTAT) 0.4 MG SL tablet Place 1 tablet (0.4 mg total) under the tongue every 5 (five) minutes x 3 doses as needed for chest pain., Starting 12/12/2014, Until Discontinued, Normal      STOP taking these medications     potassium chloride (K-DUR,KLOR-CON) 10 MEQ tablet        No Known Allergies Follow-up Information    Follow up with AVBUERE,EDWIN A, MD. Schedule an appointment as soon as possible for a visit in 1 week.   Specialty:  Internal Medicine   Why:  please check BMP, LIVER FUNCTION PANEL AND CK levels at next  office visit in [facility].   Contact information:   [address] [place] [address] [phone]        The results of significant diagnostics from this hospitalization (including imaging, microbiology, ancillary and laboratory) are listed below for reference.    Significant Diagnostic Studies: No results found.  Microbiology: Recent Results (from the past 240 hour(s))  Culture, Urine     Status: None   Collection Time: 11/04/15  2:20 PM  Result Value Ref Range Status   Specimen Description URINE, RANDOM  Final   Special Requests NONE  Final   Culture >=100,000 COLONIES/mL KLEBSIELLA PNEUMONIAE  Final   Report Status 11/06/2015 FINAL  Final   Organism ID, Bacteria KLEBSIELLA PNEUMONIAE  Final      Susceptibility   Klebsiella pneumoniae - MIC*    AMPICILLIN >=32 RESISTANT Resistant     CEFAZOLIN <=4 SENSITIVE Sensitive     CEFTRIAXONE <=1 SENSITIVE Sensitive     CIPROFLOXACIN <=0.25 SENSITIVE Sensitive     GENTAMICIN <=1 SENSITIVE Sensitive     IMIPENEM <=0.25 SENSITIVE Sensitive     NITROFURANTOIN 64 INTERMEDIATE Intermediate     TRIMETH/SULFA <=20 SENSITIVE Sensitive     AMPICILLIN/SULBACTAM 4 SENSITIVE Sensitive     PIP/TAZO <=4 SENSITIVE Sensitive     * >=100,000 COLONIES/mL KLEBSIELLA PNEUMONIAE     Labs: Basic Metabolic Panel:  Recent Labs Lab 11/04/15 1209 11/04/15 1913 11/07/15 0945  NA 141  --  139  K 5.5* 4.7 4.1  CL 107  --  107  CO2 25  --  26  GLUCOSE 219*  --  180*  BUN 24*  --  10  CREATININE 1.00  --  0.69  CALCIUM 9.6  --  8.6*   Liver Function Tests:  Recent Labs Lab 11/04/15 1209  AST 458*  ALT 393*  ALKPHOS 65  BILITOT 0.8  PROT 7.1  ALBUMIN 3.7    Recent Labs Lab 11/04/15 1209  LIPASE 46   No results for input(s): AMMONIA in the last 168 hours. CBC:  Recent Labs Lab 11/04/15 1209  WBC 7.9  HGB 11.8*  HCT 36.6  MCV 94.1  PLT 246   Cardiac Enzymes:  Recent Labs Lab 11/04/15 1209 11/05/15 0239  11/06/15 0535 11/07/15 0420  CKTOTAL 86168* 37290* 21115* 12011*  CKMB 59.7*  --   --   --    BNP: BNP (last 3 results)  Recent Labs  12/10/14 1520  BNP 42.8    ProBNP (last 3 results) No results for input(s): PROBNP in the last 8760 hours.  CBG:  Recent Labs Lab 11/06/15 0759 11/06/15 1705 11/06/15 2124 11/07/15 0731 11/07/15 1127  GLUCAP 228* 185* 123* 173* 166*       Signed:  Loretta Kluender  [facility] 11/08/2015,  10:13 AM

## 2018-09-19 ENCOUNTER — Other Ambulatory Visit: Payer: Self-pay | Admitting: Internal Medicine

## 2018-09-19 DIAGNOSIS — E2839 Other primary ovarian failure: Secondary | ICD-10-CM

## 2021-01-05 ENCOUNTER — Other Ambulatory Visit: Payer: Self-pay | Admitting: Internal Medicine

## 2021-01-06 LAB — COMPLETE METABOLIC PANEL WITH GFR
AG Ratio: 1.5 (calc) (ref 1.0–2.5)
ALT: 12 U/L (ref 6–29)
AST: 12 U/L (ref 10–35)
Albumin: 4.3 g/dL (ref 3.6–5.1)
Alkaline phosphatase (APISO): 80 U/L (ref 37–153)
BUN/Creatinine Ratio: 14 (calc) (ref 6–22)
BUN: 13 mg/dL (ref 7–25)
CO2: 23 mmol/L (ref 20–32)
Calcium: 9.1 mg/dL (ref 8.6–10.4)
Chloride: 106 mmol/L (ref 98–110)
Creat: 0.91 mg/dL — ABNORMAL HIGH (ref 0.60–0.88)
GFR, Est African American: 69 mL/min/{1.73_m2} (ref >=60)
GFR, Est Non African American: 59 mL/min/{1.73_m2} — ABNORMAL LOW (ref >=60)
Globulin: 2.8 g/dL (calc) (ref 1.9–3.7)
Glucose, Bld: 213 mg/dL — ABNORMAL HIGH (ref 65–99)
Potassium: 4.2 mmol/L (ref 3.5–5.3)
Sodium: 141 mmol/L (ref 135–146)
Total Bilirubin: 0.6 mg/dL (ref 0.2–1.2)
Total Protein: 7.1 g/dL (ref 6.1–8.1)

## 2021-01-06 LAB — CBC
HCT: 35 % (ref 35.0–45.0)
Hemoglobin: 11.4 g/dL — ABNORMAL LOW (ref 11.7–15.5)
MCH: 30.7 pg (ref 27.0–33.0)
MCHC: 32.6 g/dL (ref 32.0–36.0)
MCV: 94.3 fL (ref 80.0–100.0)
MPV: 11.5 fL (ref 7.5–12.5)
Platelets: 186 10*3/uL (ref 140–400)
RBC: 3.71 10*6/uL — ABNORMAL LOW (ref 3.80–5.10)
RDW: 14 % (ref 11.0–15.0)
WBC: 5.2 10*3/uL (ref 3.8–10.8)

## 2021-01-06 LAB — LIPID PANEL
Cholesterol: 233 mg/dL — ABNORMAL HIGH (ref <200)
HDL: 47 mg/dL — ABNORMAL LOW (ref >=50)
LDL Cholesterol (Calc): 160 mg/dL (calc) — ABNORMAL HIGH
Non-HDL Cholesterol (Calc): 186 mg/dL (calc) — ABNORMAL HIGH (ref <130)
Total CHOL/HDL Ratio: 5 (calc) — ABNORMAL HIGH (ref <5.0)
Triglycerides: 133 mg/dL (ref <150)

## 2021-01-06 LAB — TSH: TSH: 1.22 mIU/L (ref 0.40–4.50)

## 2021-02-23 ENCOUNTER — Ambulatory Visit: Payer: Self-pay | Admitting: Cardiology

## 2021-04-19 NOTE — Progress Notes (Signed)
No show.   No charge.   Emera Bussie, DO, FACC  Pager: [phone] Office: [phone]  

## 2021-04-20 ENCOUNTER — Ambulatory Visit: Payer: Self-pay | Admitting: Cardiology

## 2021-05-10 ENCOUNTER — Ambulatory Visit: Payer: Self-pay | Admitting: Cardiology

## 2021-05-26 ENCOUNTER — Other Ambulatory Visit (HOSPITAL_COMMUNITY): Payer: Self-pay | Admitting: Internal Medicine

## 2021-05-26 DIAGNOSIS — I739 Peripheral vascular disease, unspecified: Secondary | ICD-10-CM

## 2021-06-02 ENCOUNTER — Ambulatory Visit (HOSPITAL_COMMUNITY)
Admission: RE | Admit: 2021-06-02 | Discharge: 2021-06-02 | Disposition: A | Discharge status: Home / Self Care | Payer: Medicare Other | Source: Ambulatory Visit | Attending: Internal Medicine | Admitting: Internal Medicine

## 2021-06-02 ENCOUNTER — Other Ambulatory Visit: Payer: Self-pay

## 2021-06-02 DIAGNOSIS — I739 Peripheral vascular disease, unspecified: Secondary | ICD-10-CM | POA: Diagnosis present

## 2021-08-07 ENCOUNTER — Emergency Department (HOSPITAL_COMMUNITY): Payer: Medicare Other

## 2021-08-07 ENCOUNTER — Inpatient Hospital Stay (HOSPITAL_COMMUNITY)
Admission: EM | Admit: 2021-08-07 | Discharge: 2021-08-10 | DRG: 242 | Disposition: A | Discharge status: Home / Self Care | Payer: Medicare Other | Attending: Cardiovascular Disease | Admitting: Cardiovascular Disease

## 2021-08-07 ENCOUNTER — Inpatient Hospital Stay (HOSPITAL_COMMUNITY): Payer: Medicare Other

## 2021-08-07 ENCOUNTER — Encounter (HOSPITAL_COMMUNITY): Payer: Self-pay | Admitting: Emergency Medicine

## 2021-08-07 ENCOUNTER — Encounter (HOSPITAL_COMMUNITY)
Admission: EM | Disposition: A | Discharge status: Home / Self Care | Payer: Self-pay | Source: Home / Self Care | Attending: Cardiology

## 2021-08-07 ENCOUNTER — Other Ambulatory Visit: Payer: Self-pay

## 2021-08-07 DIAGNOSIS — I2510 Atherosclerotic heart disease of native coronary artery without angina pectoris: Secondary | ICD-10-CM | POA: Diagnosis present

## 2021-08-07 DIAGNOSIS — I5033 Acute on chronic diastolic (congestive) heart failure: Secondary | ICD-10-CM | POA: Diagnosis present

## 2021-08-07 DIAGNOSIS — Z23 Encounter for immunization: Secondary | ICD-10-CM

## 2021-08-07 DIAGNOSIS — E1151 Type 2 diabetes mellitus with diabetic peripheral angiopathy without gangrene: Secondary | ICD-10-CM | POA: Diagnosis present

## 2021-08-07 DIAGNOSIS — Z79899 Other long term (current) drug therapy: Secondary | ICD-10-CM | POA: Diagnosis not present

## 2021-08-07 DIAGNOSIS — Z9114 Patient's other noncompliance with medication regimen: Secondary | ICD-10-CM

## 2021-08-07 DIAGNOSIS — Z950 Presence of cardiac pacemaker: Secondary | ICD-10-CM | POA: Diagnosis not present

## 2021-08-07 DIAGNOSIS — I110 Hypertensive heart disease with heart failure: Secondary | ICD-10-CM | POA: Diagnosis present

## 2021-08-07 DIAGNOSIS — I248 Other forms of acute ischemic heart disease: Secondary | ICD-10-CM | POA: Diagnosis present

## 2021-08-07 DIAGNOSIS — E1165 Type 2 diabetes mellitus with hyperglycemia: Secondary | ICD-10-CM | POA: Diagnosis present

## 2021-08-07 DIAGNOSIS — I442 Atrioventricular block, complete: Secondary | ICD-10-CM | POA: Diagnosis present

## 2021-08-07 DIAGNOSIS — Z8674 Personal history of sudden cardiac arrest: Secondary | ICD-10-CM

## 2021-08-07 DIAGNOSIS — Z7982 Long term (current) use of aspirin: Secondary | ICD-10-CM | POA: Diagnosis not present

## 2021-08-07 DIAGNOSIS — I350 Nonrheumatic aortic (valve) stenosis: Secondary | ICD-10-CM | POA: Diagnosis present

## 2021-08-07 DIAGNOSIS — E785 Hyperlipidemia, unspecified: Secondary | ICD-10-CM | POA: Diagnosis present

## 2021-08-07 DIAGNOSIS — I252 Old myocardial infarction: Secondary | ICD-10-CM

## 2021-08-07 DIAGNOSIS — Z7984 Long term (current) use of oral hypoglycemic drugs: Secondary | ICD-10-CM

## 2021-08-07 DIAGNOSIS — Z20822 Contact with and (suspected) exposure to covid-19: Secondary | ICD-10-CM | POA: Diagnosis present

## 2021-08-07 DIAGNOSIS — I428 Other cardiomyopathies: Secondary | ICD-10-CM | POA: Diagnosis present

## 2021-08-07 HISTORY — PX: TEMPORARY PACEMAKER: CATH118268

## 2021-08-07 LAB — COMPREHENSIVE METABOLIC PANEL
ALT: 68 U/L — ABNORMAL HIGH (ref 0–44)
AST: 80 U/L — ABNORMAL HIGH (ref 15–41)
Albumin: 3.5 g/dL (ref 3.5–5.0)
Alkaline Phosphatase: 103 U/L (ref 38–126)
Anion gap: 9 (ref 5–15)
BUN: 18 mg/dL (ref 8–23)
CO2: 24 mmol/L (ref 22–32)
Calcium: 9 mg/dL (ref 8.9–10.3)
Chloride: 108 mmol/L (ref 98–111)
Creatinine, Ser: 1.08 mg/dL — ABNORMAL HIGH (ref 0.44–1.00)
GFR, Estimated: 51 mL/min — ABNORMAL LOW (ref >60)
Glucose, Bld: 286 mg/dL — ABNORMAL HIGH (ref 70–99)
Potassium: 4 mmol/L (ref 3.5–5.1)
Sodium: 141 mmol/L (ref 135–145)
Total Bilirubin: 1.1 mg/dL (ref 0.3–1.2)
Total Protein: 6.6 g/dL (ref 6.5–8.1)

## 2021-08-07 LAB — POCT I-STAT 7, (LYTES, BLD GAS, ICA,H+H)
Acid-base deficit: 1 mmol/L (ref 0.0–2.0)
Bicarbonate: 23.6 mmol/L (ref 20.0–28.0)
Calcium, Ion: 1.23 mmol/L (ref 1.15–1.40)
HCT: 31 % — ABNORMAL LOW (ref 36.0–46.0)
Hemoglobin: 10.5 g/dL — ABNORMAL LOW (ref 12.0–15.0)
O2 Saturation: 93 %
Patient temperature: 98.6
Potassium: 4.3 mmol/L (ref 3.5–5.1)
Sodium: 144 mmol/L (ref 135–145)
TCO2: 25 mmol/L (ref 22–32)
pCO2 arterial: 37.2 mmHg (ref 32.0–48.0)
pH, Arterial: 7.41 (ref 7.350–7.450)
pO2, Arterial: 66 mmHg — ABNORMAL LOW (ref 83.0–108.0)

## 2021-08-07 LAB — LIPID PANEL
Cholesterol: 131 mg/dL (ref 0–200)
HDL: 51 mg/dL (ref >40)
LDL Cholesterol: 68 mg/dL (ref 0–99)
Total CHOL/HDL Ratio: 2.6 RATIO
Triglycerides: 60 mg/dL (ref <150)
VLDL: 12 mg/dL (ref 0–40)

## 2021-08-07 LAB — BASIC METABOLIC PANEL
Anion gap: 9 (ref 5–15)
BUN: 20 mg/dL (ref 8–23)
CO2: 23 mmol/L (ref 22–32)
Calcium: 8.8 mg/dL — ABNORMAL LOW (ref 8.9–10.3)
Chloride: 110 mmol/L (ref 98–111)
Creatinine, Ser: 1.16 mg/dL — ABNORMAL HIGH (ref 0.44–1.00)
GFR, Estimated: 47 mL/min — ABNORMAL LOW (ref >60)
Glucose, Bld: 337 mg/dL — ABNORMAL HIGH (ref 70–99)
Potassium: 4.3 mmol/L (ref 3.5–5.1)
Sodium: 142 mmol/L (ref 135–145)

## 2021-08-07 LAB — CBC WITH DIFFERENTIAL/PLATELET
Abs Immature Granulocytes: 0.09 10*3/uL — ABNORMAL HIGH (ref 0.00–0.07)
Basophils Absolute: 0.1 10*3/uL (ref 0.0–0.1)
Basophils Relative: 1 %
Eosinophils Absolute: 0.1 10*3/uL (ref 0.0–0.5)
Eosinophils Relative: 1 %
HCT: 36.1 % (ref 36.0–46.0)
Hemoglobin: 11.5 g/dL — ABNORMAL LOW (ref 12.0–15.0)
Immature Granulocytes: 1 %
Lymphocytes Relative: 12 %
Lymphs Abs: 1.4 10*3/uL (ref 0.7–4.0)
MCH: 32 pg (ref 26.0–34.0)
MCHC: 31.9 g/dL (ref 30.0–36.0)
MCV: 100.6 fL — ABNORMAL HIGH (ref 80.0–100.0)
Monocytes Absolute: 0.8 10*3/uL (ref 0.1–1.0)
Monocytes Relative: 7 %
Neutro Abs: 8.6 10*3/uL — ABNORMAL HIGH (ref 1.7–7.7)
Neutrophils Relative %: 78 %
Platelets: 129 10*3/uL — ABNORMAL LOW (ref 150–400)
RBC: 3.59 MIL/uL — ABNORMAL LOW (ref 3.87–5.11)
RDW: 14.8 % (ref 11.5–15.5)
WBC: 11 10*3/uL — ABNORMAL HIGH (ref 4.0–10.5)
nRBC: 0 % (ref 0.0–0.2)

## 2021-08-07 LAB — CBC
HCT: 33 % — ABNORMAL LOW (ref 36.0–46.0)
Hemoglobin: 10.7 g/dL — ABNORMAL LOW (ref 12.0–15.0)
MCH: 31.8 pg (ref 26.0–34.0)
MCHC: 32.4 g/dL (ref 30.0–36.0)
MCV: 98.2 fL (ref 80.0–100.0)
Platelets: 164 10*3/uL (ref 150–400)
RBC: 3.36 MIL/uL — ABNORMAL LOW (ref 3.87–5.11)
RDW: 14.7 % (ref 11.5–15.5)
WBC: 10.8 10*3/uL — ABNORMAL HIGH (ref 4.0–10.5)
nRBC: 0.2 % (ref 0.0–0.2)

## 2021-08-07 LAB — MAGNESIUM
Magnesium: 1.9 mg/dL (ref 1.7–2.4)
Magnesium: 1.9 mg/dL (ref 1.7–2.4)

## 2021-08-07 LAB — TROPONIN I (HIGH SENSITIVITY)
Troponin I (High Sensitivity): 26 ng/L — ABNORMAL HIGH (ref <18)
Troponin I (High Sensitivity): 32 ng/L — ABNORMAL HIGH (ref <18)
Troponin I (High Sensitivity): 85 ng/L — ABNORMAL HIGH (ref <18)

## 2021-08-07 LAB — PROTIME-INR
INR: 1 (ref 0.8–1.2)
Prothrombin Time: 13.6 seconds (ref 11.4–15.2)

## 2021-08-07 LAB — HEMOGLOBIN A1C
Hgb A1c MFr Bld: 9.3 % — ABNORMAL HIGH (ref 4.8–5.6)
Mean Plasma Glucose: 220.21 mg/dL

## 2021-08-07 LAB — RESP PANEL BY RT-PCR (FLU A&B, COVID) ARPGX2
Influenza A by PCR: NEGATIVE
Influenza B by PCR: NEGATIVE
SARS Coronavirus 2 by RT PCR: NEGATIVE

## 2021-08-07 LAB — SURGICAL PCR SCREEN
MRSA, PCR: NEGATIVE
Staphylococcus aureus: NEGATIVE

## 2021-08-07 LAB — LACTIC ACID, PLASMA
Lactic Acid, Venous: 1.1 mmol/L (ref 0.5–1.9)
Lactic Acid, Venous: 1.3 mmol/L (ref 0.5–1.9)

## 2021-08-07 LAB — TSH: TSH: 0.889 u[IU]/mL (ref 0.350–4.500)

## 2021-08-07 LAB — GLUCOSE, CAPILLARY
Glucose-Capillary: 241 mg/dL — ABNORMAL HIGH (ref 70–99)
Glucose-Capillary: 229 mg/dL — ABNORMAL HIGH (ref 70–99)
Glucose-Capillary: 189 mg/dL — ABNORMAL HIGH (ref 70–99)

## 2021-08-07 LAB — BRAIN NATRIURETIC PEPTIDE: B Natriuretic Peptide: 571.2 pg/mL — ABNORMAL HIGH (ref 0.0–100.0)

## 2021-08-07 SURGERY — TEMPORARY PACEMAKER
Anesthesia: LOCAL

## 2021-08-07 MED ORDER — ISOPROTERENOL HCL 0.2 MG/ML IJ SOLN
2.0000 ug/min | INTRAVENOUS | Status: DC
  Filled 2021-08-07: qty 5

## 2021-08-07 MED ORDER — ATORVASTATIN CALCIUM 40 MG PO TABS
40.0000 mg | ORAL_TABLET | Freq: Every day | ORAL | Status: DC
  Administered 2021-08-07 – 2021-08-10 (×4): 40 mg via ORAL
  Filled 2021-08-07 (×4): qty 1

## 2021-08-07 MED ORDER — LOSARTAN POTASSIUM 50 MG PO TABS
100.0000 mg | ORAL_TABLET | Freq: Every day | ORAL | Status: DC
  Administered 2021-08-07 – 2021-08-10 (×4): 100 mg via ORAL
  Filled 2021-08-07 (×4): qty 2

## 2021-08-07 MED ORDER — PANTOPRAZOLE SODIUM 40 MG PO TBEC
40.0000 mg | DELAYED_RELEASE_TABLET | Freq: Every day | ORAL | Status: DC
  Administered 2021-08-07 – 2021-08-10 (×4): 40 mg via ORAL
  Filled 2021-08-07 (×4): qty 1

## 2021-08-07 MED ORDER — DOPAMINE-DEXTROSE 3.2-5 MG/ML-% IV SOLN
INTRAVENOUS | Status: AC
  Filled 2021-08-07: qty 250

## 2021-08-07 MED ORDER — INSULIN ASPART 100 UNIT/ML IJ SOLN
0.0000 [IU] | Freq: Three times a day (TID) | INTRAMUSCULAR | Status: DC
  Administered 2021-08-07: 3 [IU] via SUBCUTANEOUS

## 2021-08-07 MED ORDER — LIDOCAINE HCL (PF) 1 % IJ SOLN
INTRAMUSCULAR | Status: AC
  Filled 2021-08-07: qty 30

## 2021-08-07 MED ORDER — INFLUENZA VAC A&B SA ADJ QUAD 0.5 ML IM PRSY
0.5000 mL | PREFILLED_SYRINGE | INTRAMUSCULAR | Status: AC
  Administered 2021-08-10: 0.5 mL via INTRAMUSCULAR
  Filled 2021-08-07: qty 0.5

## 2021-08-07 MED ORDER — ASPIRIN 300 MG RE SUPP: 300.0000 mg | RECTAL | Status: AC

## 2021-08-07 MED ORDER — HEPARIN SODIUM (PORCINE) 5000 UNIT/ML IJ SOLN
5000.0000 [IU] | Freq: Three times a day (TID) | INTRAMUSCULAR | Status: DC
  Administered 2021-08-07 (×2): 5000 [IU] via SUBCUTANEOUS
  Filled 2021-08-07 (×3): qty 1

## 2021-08-07 MED ORDER — ASPIRIN EC 81 MG PO TBEC
81.0000 mg | DELAYED_RELEASE_TABLET | Freq: Every day | ORAL | Status: DC
  Administered 2021-08-08 – 2021-08-10 (×3): 81 mg via ORAL
  Filled 2021-08-07 (×3): qty 1

## 2021-08-07 MED ORDER — INSULIN ASPART 100 UNIT/ML IJ SOLN
0.0000 [IU] | Freq: Three times a day (TID) | INTRAMUSCULAR | Status: DC
  Administered 2021-08-07: 5 [IU] via SUBCUTANEOUS
  Administered 2021-08-08: 3 [IU] via SUBCUTANEOUS
  Administered 2021-08-08: 2 [IU] via SUBCUTANEOUS
  Administered 2021-08-08: 5 [IU] via SUBCUTANEOUS
  Administered 2021-08-09: 3 [IU] via SUBCUTANEOUS

## 2021-08-07 MED ORDER — CHLORHEXIDINE GLUCONATE CLOTH 2 % EX PADS
6.0000 | MEDICATED_PAD | Freq: Every day | CUTANEOUS | Status: DC
  Administered 2021-08-07 – 2021-08-09 (×3): 6 via TOPICAL

## 2021-08-07 MED ORDER — LIDOCAINE HCL (PF) 1 % IJ SOLN
INTRAMUSCULAR | Status: DC | PRN
  Administered 2021-08-07 (×2): 10 mL

## 2021-08-07 MED ORDER — HEPARIN (PORCINE) IN NACL 1000-0.9 UT/500ML-% IV SOLN
INTRAVENOUS | Status: DC | PRN
  Administered 2021-08-07: 500 mL

## 2021-08-07 MED ORDER — SODIUM CHLORIDE 0.9 % IV SOLN: INTRAVENOUS | Status: DC

## 2021-08-07 MED ORDER — SODIUM CHLORIDE 0.9 % IV SOLN
INTRAVENOUS | Status: AC | PRN
  Administered 2021-08-07: 50 mL/h via INTRAVENOUS

## 2021-08-07 MED ORDER — ASPIRIN 81 MG PO CHEW
324.0000 mg | CHEWABLE_TABLET | ORAL | Status: AC
  Administered 2021-08-07: 324 mg via ORAL
  Filled 2021-08-07: qty 4

## 2021-08-07 MED ORDER — AMLODIPINE BESYLATE 5 MG PO TABS
5.0000 mg | ORAL_TABLET | Freq: Every day | ORAL | Status: DC
  Administered 2021-08-07 – 2021-08-08 (×2): 5 mg via ORAL
  Filled 2021-08-07 (×2): qty 1

## 2021-08-07 MED ORDER — NITROGLYCERIN 0.4 MG SL SUBL: 0.4000 mg | SUBLINGUAL_TABLET | SUBLINGUAL | Status: DC | PRN

## 2021-08-07 MED ORDER — LOSARTAN POTASSIUM 50 MG PO TABS: 50.0000 mg | ORAL_TABLET | Freq: Every day | ORAL | Status: DC

## 2021-08-07 MED ORDER — ACETAMINOPHEN 325 MG PO TABS
650.0000 mg | ORAL_TABLET | ORAL | Status: DC | PRN
Start: 2021-08-07 — End: 2021-08-08
  Administered 2021-08-08: 650 mg via ORAL
  Filled 2021-08-07: qty 2

## 2021-08-07 MED ORDER — ONDANSETRON HCL 4 MG/2ML IJ SOLN: 4.0000 mg | Freq: Four times a day (QID) | INTRAMUSCULAR | Status: DC | PRN

## 2021-08-07 MED ORDER — NITROGLYCERIN 2 % TD OINT
0.5000 [in_us] | TOPICAL_OINTMENT | Freq: Four times a day (QID) | TRANSDERMAL | Status: DC
  Administered 2021-08-07 – 2021-08-09 (×9): 0.5 [in_us] via TOPICAL
  Filled 2021-08-07: qty 30

## 2021-08-07 SURGICAL SUPPLY — 8 items
CABLE ADAPT PACING TEMP 12FT (ADAPTER) ×2
CATH S G BIP PACING (CATHETERS) ×2
INQWIRE 1.5J .035X260CM (WIRE) ×2
PACK CARDIAC CATHETERIZATION (CUSTOM PROCEDURE TRAY) ×2
PINNACLE LONG 6F 25CM (SHEATH) ×2
PROTECTION STATION PRESSURIZED (MISCELLANEOUS) ×2
SHEATH PROBE COVER 6X72 (BAG) ×2
SLEEVE REPOSITIONING LENGTH 30 (MISCELLANEOUS) ×2

## 2021-08-07 NOTE — ED Notes (Signed)
Placed pt on purewick  

## 2021-08-07 NOTE — ED Notes (Signed)
Pts daughter Octavio Manns [phone] the pts daughter notified

## 2021-08-07 NOTE — ED Notes (Signed)
Breakfast Ordered 

## 2021-08-07 NOTE — ED Notes (Signed)
Dr Sharyn Lull here to see this pt

## 2021-08-07 NOTE — Progress Notes (Signed)
Received from ED with 02 at 2 L [place].Alert and oriented x 4.C/O dizziness upon movement.sats 96% on the monitor.Will monitor closely.Dr.Carlisle at bedside.Pt hr down to 20's.Tried to pace but not capturing.Atropine 0.5 mg given per MD.HR to 40's.Akert and oriented but pale looking.Will monitor closely.

## 2021-08-07 NOTE — ED Triage Notes (Signed)
Pt BIB [facility] from home, c/o shortness of breath on waking, pt found to be bradycardic by EMS in the 30s. Pt reports HR normally in the 80s. States she took her carvedilol around 1830 tonight. Given fluids with some improvement.

## 2021-08-07 NOTE — Consult Note (Signed)
ELECTROPHYSIOLOGY CONSULT NOTE  Patient ID: Amanda Stewart, MRN: [id number], DOB/AGE: 82-Feb-1942 82 y.o. Admit date: 08/07/2021 Date of Consult: 08/07/2021  Primary Physician: Fleet Contras, MD Primary Cardiologist: Amanda Stewart is a 80 y.o. female who is being seen today for the evaluation of complete heart block at the request of Amanda DOE.   Chief Complaint: DOE   HPI Amanda Stewart is a 80 y.o. female admitted with profound dyspnea at which time she was found to have complete heart block  for which she received this morning transvenous pacemaker  Also complaining of lightheadedness and presyncope.  No palpitations  History of VF arrest 7/15 for which she was treated by an AED; the notes are unclear, the HPI describes her having had severe shortness of breath, EMS arrived while she was still awake and then had VF requiring shocks.  Catheterization demonstrated narrowing and a small circumflex  pK Troponin 0.4  History of  left bundle branch block  DATE TEST EF   6/15 LHC 45% LCx--99%  6/15 Echo  40-45%   1/16 Echo   65-70 % Severe LVH (35mm)  1/16 MYOVIEW  26%         Apparently poorly compliant w meds    Past Medical History:  Diagnosis Date   Anemia    Arrhythmia    CAD (coronary artery disease)    Chest pain    Combined systolic and diastolic heart failure (HCC)    Diabetes mellitus without complication (HCC)    GERD (gastroesophageal reflux disease)    Hyperlipidemia    Hypertension    Left bundle branch block    Myocardial infarction [facility])       Surgical History:  Past Surgical History:  Procedure Laterality Date   ABDOMINAL HYSTERECTOMY     LEFT AND RIGHT HEART CATHETERIZATION WITH CORONARY ANGIOGRAM N/A 05/26/2014   Procedure: LEFT AND RIGHT HEART CATHETERIZATION WITH CORONARY ANGIOGRAM;  Surgeon: [name], MD;  Location: [facility] CATH LAB;  Service: Cardiovascular;  Laterality: N/A;     Home Meds: Prior to Admission medications   Medication Sig Start Date  End Date Taking? Authorizing Provider  albuterol (VENTOLIN HFA) 108 (90 Base) MCG/ACT inhaler Inhale 2 puffs into the lungs every 6 (six) hours as needed for wheezing or shortness of breath.   Yes [provider]  amLODipine (NORVASC) 10 MG tablet Take 10 mg by mouth daily.   Yes [provider]  aspirin EC 81 MG tablet Take 81 mg by mouth daily.   Yes [provider]  atorvastatin (LIPITOR) 40 MG tablet Take 40 mg by mouth daily.   Yes [provider]  carvedilol (COREG) 12.5 MG tablet Take 12.5 mg by mouth 2 (two) times daily with a meal.   Yes [provider]  cilostazol (PLETAL) 100 MG tablet Take 100 mg by mouth daily.   Yes [provider]  dorzolamide (TRUSOPT) 2 % ophthalmic solution Place 1 drop into both eyes 2 (two) times daily. 07/09/21  Yes [provider]  glimepiride (AMARYL) 2 MG tablet Take 2 mg by mouth daily. 11/13/14  Yes [provider]  hydrALAZINE (APRESOLINE) 50 MG tablet Take 50 mg by mouth 2 (two) times daily. 07/09/21  Yes [provider]  JARDIANCE 10 MG TABS tablet Take 10 mg by mouth every morning. 07/12/21  Yes [provider]  linagliptin (TRADJENTA) 5 MG TABS tablet Take 5 mg by mouth daily.   Yes [provider]  lisinopril (PRINIVIL,ZESTRIL) 5 MG tablet Take 1 tablet (5 mg total) by mouth daily. 06/01/14  Yes [name], MD  metFORMIN (GLUCOPHAGE) 1000 MG tablet Take 1,000 mg by mouth 2 (two) times daily with a meal.   Yes [provider]  Multiple Vitamin (MULTIVITAMIN WITH MINERALS) TABS tablet Take 1 tablet by mouth daily.   Yes [provider]  nitroGLYCERIN (NITROSTAT) 0.4 MG SL tablet Place 1 tablet (0.4 mg total) under the tongue every 5 (five) minutes x 3 doses as needed for chest pain. 12/12/14  Yes [name], MD  amLODipine (NORVASC) 5 MG tablet Take 1 tablet (5 mg total) by mouth 2 (two) times daily. Patient not taking: Reported on  08/07/2021 06/01/14   [name], MD    Inpatient Medications:   amLODipine  5 mg Oral Daily   [START ON 08/08/2021] aspirin EC  81 mg Oral Daily   atorvastatin  40 mg Oral Daily   heparin  5,000 Units Subcutaneous Q8H   insulin aspart  0-9 Units Subcutaneous TID WC   losartan  100 mg Oral Daily   nitroGLYCERIN  0.5 inch Topical Q6H   pantoprazole  40 mg Oral Q0600      Allergies: No Known Allergies  Social History   Socioeconomic History   Marital status: Married    Spouse name: Not on file   Number of children: Not on file   Years of education: Not on file   Highest education level: Not on file  Occupational History   Not on file  Tobacco Use   Smoking status: Never   Smokeless tobacco: Not on file  Substance and Sexual Activity   Alcohol use: Yes    Alcohol/week: 1.0 standard drink    Types: 1 Glasses of wine per week   Drug use: No   Sexual activity: Not on file  Other Topics Concern   Not on file  Social History Narrative   Not on file   Social Determinants of Health   Financial Resource Strain: Not on file  Food Insecurity: Not on file  Transportation Needs: Not on file  Physical Activity: Not on file  Stress: Not on file  Social Connections: Not on file  Intimate Partner Violence: Not on file     History reviewed. No pertinent family history.   ROS:  Please see the history of present illness.     All other systems reviewed and negative.    Physical Exam:  Blood pressure (!) 173/51, pulse (!) 59, temperature 98.6 F (37 C), temperature source Oral, resp. rate (!) 21, SpO2 98 %. General: Well developed, well nourished female in no acute distress. Head: Normocephalic, atraumatic, sclera non-icteric, no xanthomas, nares are without discharge. EENT: normal Lymph Nodes:  none Back: without scoliosis/kyphosis, no CVA tendersness Neck: Negative for carotid bruits. JVD unable to discesr Lungs: Clear bilaterally to auscultation without wheezes, rales, or  rhonchi. Breathing is unlabored. Heart: RRR with S1 S2-single   3/6 systolic murmur , rubs, or gallops appreciated. Abdomen: Soft, non-tender, non-distended with normoactive bowel sounds. No hepatomegaly. No rebound/guarding. No obvious abdominal masses. Msk:  Strength and tone appear normal for age. Extremities: No clubbing or cyanosis. No edema.  Distal pedal pulses are 2+ and equal bilaterally. Skin: Warm and Dry Neuro: Alert and oriented X 3. CN III-XII intact Grossly normal sensory and motor function . Psych:  Responds to questions appropriately with a normal affect.      Labs: Cardiac Enzymes No results  for input(s): CKTOTAL, CKMB, TROPONINI in the last 72 hours. CBC Lab Results  Component Value Date   WBC 10.8 (H) 08/07/2021   HGB 10.7 (L) 08/07/2021   HCT 33.0 (L) 08/07/2021   MCV 98.2 08/07/2021   PLT 164 08/07/2021   PROTIME: Recent Labs    08/07/21 0634  LABPROT 13.6  INR 1.0   Chemistry  Recent Labs  Lab 08/07/21 0153 08/07/21 0630 08/07/21 0634  NA 141   < > 142  K 4.0   < > 4.3  CL 108  --  110  CO2 24  --  23  BUN 18  --  20  CREATININE 1.08*  --  1.16*  CALCIUM 9.0  --  8.8*  PROT 6.6  --   --   BILITOT 1.1  --   --   ALKPHOS 103  --   --   ALT 68*  --   --   AST 80*  --   --   GLUCOSE 286*  --  337*   < > = values in this interval not displayed.   Lipids Lab Results  Component Value Date   CHOL 131 08/07/2021   HDL 51 08/07/2021   LDLCALC 68 08/07/2021   TRIG 60 08/07/2021   BNP Pro B Natriuretic peptide (BNP)  Date/Time Value Ref Range Status  05/25/2014 04:01 AM 1,748.0 (H) 0 - 125 pg/mL Final  05/24/2014 06:32 AM 1,419.0 (H) 0 - 125 pg/mL Final  04/09/2009 08:02 AM 66.0 0.0 - 100.0 pg/mL Final   Thyroid Function Tests: Recent Labs    08/07/21 0634  TSH 0.889      Miscellaneous Lab Results  Component Value Date   DDIMER 0.47 12/10/2014    Radiology/Studies:  DG Chest Portable 1 View  Result Date: 08/07/2021 CLINICAL  DATA:  Shortness of breath. EXAM: PORTABLE CHEST 1 VIEW COMPARISON:  Chest radiograph dated 12/10/2014. FINDINGS: No focal consolidation, pleural effusion, or pneumothorax. Stable cardiac silhouette. No acute osseous pathology. Degenerative changes of the spine and shoulders. IMPRESSION: No active disease. Electronically Signed   By: Elgie Collard M.D.   On: 08/07/2021 01:23    EKG: Complete heart block 40 sinus.  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  This has been longstanding.  It is unlikely that the problem will resolve with its discontinuation but is most appropriate to to ascertain.  We will hold it.  Has a half-life of about 7 hours so by tomorrow morning we should have some idea as to whether they will be recurrence of conduction. She has longstanding left bundle branch block.  Unclear as to the degree of left ventricular dysfunction but almost certainly not normal (the ejection fraction from the Myoview is strikingly discordant with all of her other data) so would anticipate CRT if possible. The other issue which is unresolved is her cardiac arrest from 46.  It occurred in the setting of a peak troponin of 0.4.  There was a high-grade diagonal lesion on catheterization.  No other culprit was identified.  Have discussed with her device implantation. And will review with her daughter ( called but no answer)  Await echo to assess degree of aortic stenosis and LV function     Sherryl Manges

## 2021-08-07 NOTE — ED Notes (Signed)
Iv team here 

## 2021-08-07 NOTE — Plan of Care (Signed)

## 2021-08-07 NOTE — H&P (Signed)
Amanda Stewart is an 80 y.o. female.   Chief Complaint: Shortness of breath associated with dizziness HPI: Patient is 80 year old female with past medical history significant for coronary artery disease history of small MI in the setting of cardiac arrest approximately 7 years ago noted to have 1 small vessel disease treated medically, history of cardiac arrest in the past, chronic left bundle branch block, hypertension, diabetes mellitus, history of congestive heart failure secondary to systolic/diastolic dysfunction, hyperlipidemia came to ER by EMS complaining of sudden onset of shortness of breath associated with feeling dizziness which woke her up from sleep.  Patient states she went to the bathroom and felt very dizzy and lightheaded denies any syncopal episode.  Denies any palpitations.  Denies any chest pain nausea vomiting or diaphoresis.  States lately she has been feeling tired and low energy but denies any shortness of breath in the past, called EMS and was noted to be bradycardic heart rate in 30s with complete heart block.  Patient was placed on transcutaneous temporary pacemaker in the ED.  Patient presently denies any complaints denies any shortness of breath or dizziness.  Patient denies history of PND orthopnea leg swelling.  Denies history of syncopal episode.  Denies any history of thyroid problems states she has been taking carvedilol for long time, states recently was started on particular medication for circulation.  Past Medical History:  Diagnosis Date   Anemia    Arrhythmia    CAD (coronary artery disease)    Chest pain    Combined systolic and diastolic heart failure (HCC)    Diabetes mellitus without complication (HCC)    GERD (gastroesophageal reflux disease)    Hyperlipidemia    Hypertension    Left bundle branch block    Myocardial infarction [facility])     Past Surgical History:  Procedure Laterality Date   ABDOMINAL HYSTERECTOMY     LEFT AND RIGHT HEART CATHETERIZATION  WITH CORONARY ANGIOGRAM N/A 05/26/2014   Procedure: LEFT AND RIGHT HEART CATHETERIZATION WITH CORONARY ANGIOGRAM;  Surgeon: [name], MD;  Location: [facility] CATH LAB;  Service: Cardiovascular;  Laterality: N/A;    No family history on file. Social History:  reports current alcohol use of about 1.0 standard drink per week. She reports that she does not use drugs. No history on file for tobacco use.  Allergies: No Known Allergies  (Not in a hospital admission)   Results for orders placed or performed during the hospital encounter of 08/07/21 (from the past 48 hour(s))  CBC with Differential/Platelet     Status: Abnormal   Collection Time: 08/07/21  1:53 AM  Result Value Ref Range   WBC 11.0 (H) 4.0 - 10.5 K/uL   RBC 3.59 (L) 3.87 - 5.11 MIL/uL   Hemoglobin 11.5 (L) 12.0 - 15.0 g/dL   HCT 22.0 25.4 - 27.0 %   MCV 100.6 (H) 80.0 - 100.0 fL   MCH 32.0 26.0 - 34.0 pg   MCHC 31.9 30.0 - 36.0 g/dL   RDW 62.3 76.2 - 83.1 %   Platelets 129 (L) 150 - 400 K/uL    Comment: REPEATED TO VERIFY   nRBC 0.0 0.0 - 0.2 %   Neutrophils Relative % 78 %   Neutro Abs 8.6 (H) 1.7 - 7.7 K/uL   Lymphocytes Relative 12 %   Lymphs Abs 1.4 0.7 - 4.0 K/uL   Monocytes Relative 7 %   Monocytes Absolute 0.8 0.1 - 1.0 K/uL   Eosinophils Relative 1 %  Eosinophils Absolute 0.1 0.0 - 0.5 K/uL   Basophils Relative 1 %   Basophils Absolute 0.1 0.0 - 0.1 K/uL   Immature Granulocytes 1 %   Abs Immature Granulocytes 0.09 (H) 0.00 - 0.07 K/uL    Comment: Performed at [facility], [address] [place], [place] [address]   DG Chest Portable 1 View  Result Date: 08/07/2021 CLINICAL DATA:  Shortness of breath. EXAM: PORTABLE CHEST 1 VIEW COMPARISON:  Chest radiograph dated 12/10/2014. FINDINGS: No focal consolidation, pleural effusion, or pneumothorax. Stable cardiac silhouette. No acute osseous pathology. Degenerative changes of the spine and shoulders. IMPRESSION: No active disease. Electronically Signed    By: Elgie Collard M.D.   On: 08/07/2021 01:23    Review of Systems  Constitutional:  Negative for diaphoresis and fever.  HENT:  Negative for sore throat.   Eyes:  Negative for discharge.  Respiratory:  Positive for shortness of breath.   Cardiovascular:  Negative for chest pain, palpitations and leg swelling.  Gastrointestinal:  Negative for abdominal distention and abdominal pain.  Genitourinary:  Negative for difficulty urinating.  Neurological:  Positive for dizziness. Negative for seizures and syncope.   Blood pressure (!) 203/56, pulse (!) 39, temperature 98.6 F (37 C), resp. rate (!) 21, SpO2 95 %. Physical Exam Constitutional:      Appearance: Normal appearance.  HENT:     Head: Atraumatic.  Eyes:     Extraocular Movements: Extraocular movements intact.     Conjunctiva/sclera: Conjunctivae normal.     Pupils: Pupils are equal, round, and reactive to light.  Cardiovascular:     Comments: Bradycardic S1-S2 soft 2/6 systolic murmur noted Pulmonary:     Effort: Pulmonary effort is normal.  Abdominal:     General: Abdomen is flat. Bowel sounds are normal.  Musculoskeletal:     Cervical back: Normal range of motion and neck supple.     Comments: No clubbing cyanosis or edema  Skin:    General: Skin is warm and dry.  Neurological:     Mental Status: She is alert.     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  We will continue transcutaneous pacemaker for now Will get EP consult for permanent pacemaker insertion in a.m. Hold all AV blocking medications. Check 2D echo in a.m.  Rinaldo Cloud,  MD 08/07/2021, 2:41 AM

## 2021-08-07 NOTE — ED Notes (Signed)
Iv team stil at  the pts bedside

## 2021-08-07 NOTE — ED Provider Notes (Signed)
[facility] EMERGENCY DEPARTMENT Provider Note   CSN: [id number] Arrival date & time: 08/07/21  0044     History Chief Complaint  Patient presents with   Bradycardia    Amanda Stewart is a 80 y.o. female.  Level 5 caveat for acuity of condition.  Patient from home with shortness of breath and dizziness that woke her from sleep.  States she felt well when she went to bed.  EMS found her to be bradycardic in the 30s.  She was not hypoxic for EMS but continued to feel short of breath.  No cough or fever.  No chest pain.  No abdominal pain, nausea, vomiting, leg pain or leg swelling.  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  Does have a history of diabetes and ischemic cardiomyopathy. Does take Coreg as well as amlodipine.  Denies any recent medication changes  The history is provided by the patient and the EMS personnel.      Past Medical History:  Diagnosis Date   Anemia    Arrhythmia    CAD (coronary artery disease)    Chest pain    Combined systolic and diastolic heart failure (HCC)    Diabetes mellitus without complication (HCC)    GERD (gastroesophageal reflux disease)    Hyperlipidemia    Hypertension    Left bundle branch block    Myocardial infarction [facility])     Patient Active Problem List   Diagnosis Date Noted   Hyperkalemia 11/04/2015   Rhabdomyolysis 11/04/2015   CAD (coronary artery disease) 11/04/2015   Transaminitis 11/04/2015   Diabetes mellitus, type 2 (HCC) 11/04/2015   Chest pain 12/10/2014   Chest pain at rest 12/10/2014   Cardiac arrest (HCC) 05/24/2014   Acute respiratory failure with hypoxia (HCC) 05/24/2014   Acute renal insufficiency 05/24/2014    Past Surgical History:  Procedure Laterality Date   ABDOMINAL HYSTERECTOMY     LEFT AND RIGHT HEART CATHETERIZATION WITH CORONARY ANGIOGRAM N/A 05/26/2014   Procedure: LEFT AND RIGHT HEART CATHETERIZATION WITH CORONARY ANGIOGRAM;  Surgeon: [name], MD;   Location: [facility] CATH LAB;  Service: Cardiovascular;  Laterality: N/A;     OB History   No obstetric history on file.     No family history on file.  Social History   Tobacco Use   Smoking status: Never  Substance Use Topics   Alcohol use: Yes    Alcohol/week: 1.0 standard drink    Types: 1 Glasses of wine per week   Drug use: No    Home Medications Prior to Admission medications   Medication Sig Start Date End Date Taking? Authorizing Provider  amLODipine (NORVASC) 5 MG tablet Take 1 tablet (5 mg total) by mouth 2 (two) times daily. 06/01/14   [name], MD  aspirin EC 81 MG tablet Take 81 mg by mouth daily.    [provider]  carvedilol (COREG) 12.5 MG tablet Take 12.5 mg by mouth 2 (two) times daily with a meal.    [provider]  ciprofloxacin (CIPRO) 500 MG tablet Take 1 tablet (500 mg total) by mouth 2 (two) times daily. 11/07/15   Kathlen Mody, MD  glimepiride (AMARYL) 2 MG tablet Take 2 mg by mouth daily. 11/13/14   [provider]  linagliptin (TRADJENTA) 5 MG TABS tablet Take 5 mg by mouth daily.    [provider]  lisinopril (PRINIVIL,ZESTRIL) 5 MG tablet Take 1 tablet (5 mg total) by  mouth daily. 06/01/14   [name], MD  metFORMIN (GLUCOPHAGE) 1000 MG tablet Take 1,000 mg by mouth 2 (two) times daily with a meal.    [provider]  nitroGLYCERIN (NITROSTAT) 0.4 MG SL tablet Place 1 tablet (0.4 mg total) under the tongue every 5 (five) minutes x 3 doses as needed for chest pain. 12/12/14   [name], MD    Allergies    Patient has no known allergies.  Review of Systems   Review of Systems  Constitutional:  Negative for activity change, appetite change and fever.  HENT:  Negative for sinus pain.   Respiratory:  Positive for shortness of breath. Negative for cough, chest tightness and wheezing.   Cardiovascular:  Negative for chest pain.  Gastrointestinal:  Negative for nausea and vomiting.  Genitourinary:   Negative for dysuria and hematuria.  Neurological:  Positive for dizziness and light-headedness. Negative for weakness and numbness.   all other systems are negative except as noted in the HPI and PMH.   Physical Exam Updated Vital Signs There were no vitals taken for this visit.  Physical Exam Vitals and nursing note reviewed.  Constitutional:      General: She is not in acute distress.    Appearance: She is well-developed.  HENT:     Head: Normocephalic and atraumatic.     Mouth/Throat:     Pharynx: No oropharyngeal exudate.  Eyes:     Conjunctiva/sclera: Conjunctivae normal.     Pupils: Pupils are equal, round, and reactive to light.  Neck:     Comments: No meningismus. Cardiovascular:     Rate and Rhythm: Bradycardia present. Rhythm irregular.     Heart sounds: Normal heart sounds. No murmur heard.    Comments: Bradycardia in 30s. Pulmonary:     Effort: Pulmonary effort is normal. No respiratory distress.     Breath sounds: Normal breath sounds.  Chest:     Chest wall: No tenderness.  Abdominal:     Palpations: Abdomen is soft.     Tenderness: There is no abdominal tenderness. There is no guarding or rebound.  Musculoskeletal:        General: No tenderness. Normal range of motion.     Cervical back: Normal range of motion and neck supple.  Skin:    General: Skin is warm.  Neurological:     Mental Status: She is alert and oriented to person, place, and time.     Cranial Nerves: No cranial nerve deficit.     Motor: No abnormal muscle tone.     Coordination: Coordination normal.     Comments:  5/5 strength throughout. CN 2-12 intact.Equal grip strength.   Psychiatric:        Behavior: Behavior normal.    ED Results / Procedures / Treatments   Labs (all labs ordered are listed, but only abnormal results are displayed) Labs Reviewed  CBC WITH DIFFERENTIAL/PLATELET - Abnormal; Notable for the following components:      Result Value   WBC 11.0 (*)    RBC 3.59 (*)     Hemoglobin 11.5 (*)    MCV 100.6 (*)    Platelets 129 (*)    Neutro Abs 8.6 (*)    Abs Immature Granulocytes 0.09 (*)    All other components within normal limits  COMPREHENSIVE METABOLIC PANEL - Abnormal; Notable for the following components:   Glucose, Bld 286 (*)    Creatinine, Ser 1.08 (*)    AST 80 (*)  ALT 68 (*)    GFR, Estimated 51 (*)    All other components within normal limits  BRAIN NATRIURETIC PEPTIDE - Abnormal; Notable for the following components:   B Natriuretic Peptide 571.2 (*)    All other components within normal limits  TROPONIN I (HIGH SENSITIVITY) - Abnormal; Notable for the following components:   Troponin I (High Sensitivity) 26 (*)    All other components within normal limits  RESP PANEL BY RT-PCR (FLU A&B, COVID) ARPGX2  MAGNESIUM  BLOOD GAS, ARTERIAL  TROPONIN I (HIGH SENSITIVITY)    EKG EKG Interpretation  Date/Time:  Sunday August 07 2021 01:11:09 EDT Ventricular Rate:  40 PR Interval:    QRS Duration: 158 QT Interval:  580 QTC Calculation: 474 R Axis:   190 Text Interpretation: Complete AV block with wide QRS complex Right bundle branch block Low voltage QRS When compared with ECG of 11/04/2015, Complete (3-degree) AV block is now present Low voltage QRS is now present Confirmed by Dione Booze ([id number]) on 08/07/2021 4:04:46 AM  Radiology DG Chest Portable 1 View  Result Date: 08/07/2021 CLINICAL DATA:  Shortness of breath. EXAM: PORTABLE CHEST 1 VIEW COMPARISON:  Chest radiograph dated 12/10/2014. FINDINGS: No focal consolidation, pleural effusion, or pneumothorax. Stable cardiac silhouette. No acute osseous pathology. Degenerative changes of the spine and shoulders. IMPRESSION: No active disease. Electronically Signed   By: Elgie Collard M.D.   On: 08/07/2021 01:23    Procedures .Critical Care Performed by: Glynn Octave, MD Authorized by: Glynn Octave, MD   Critical care provider statement:    Critical care time  (minutes):  45   Critical care was necessary to treat or prevent imminent or life-threatening deterioration of the following conditions:  Cardiac failure (complete heart block)   Critical care was time spent personally by me on the following activities:  Discussions with consultants, evaluation of patient's response to treatment, examination of patient, ordering and performing treatments and interventions, ordering and review of laboratory studies, ordering and review of radiographic studies, pulse oximetry, re-evaluation of patient's condition, obtaining history from patient or surrogate and review of old charts   Medications Ordered in ED Medications - No data to display  ED Course  I have reviewed the triage vital signs and the nursing notes.  Pertinent labs & imaging results that were available during my care of the patient were reviewed by me and considered in my medical decision making (see chart for details).    MDM Rules/Calculators/A&P                          Shortness of breath and dizziness with bradycardia and complete heart block.   Mental status is stable.  Blood pressure is elevated.  Her cardiologist is Dr. Algie Coffer.  Continues to see him and last saw him last week.  Discussed with Dr. Algie Coffer as well as Dr. Sharyn Lull. Dr. Sharyn Lull will admit the patient.  Labs show hyperglycemia without DKA.  Electrolytes are stable.  Chest x-ray is negative  Blood pressure and mental status remained stable throughout ED course.  Remains bradycardic feeling short of breath and dizzy however.  Patient to be admitted to ICU by Dr. Sharyn Lull for pacemaker placement later today. Did not initiate transcutaneous pacemaker due to stability. Final Clinical Impression(s) / ED Diagnoses Final diagnoses:  Complete heart block (HCC)    Rx / DC Orders ED Discharge Orders     None  Glynn Octave, MD 08/07/21 [phone]

## 2021-08-07 NOTE — Plan of Care (Signed)

## 2021-08-07 NOTE — Progress Notes (Signed)
  Echocardiogram 2D Echocardiogram has been performed.  Amanda Stewart 08/07/2021, 3:32 PM

## 2021-08-07 NOTE — ED Notes (Signed)
On arrival the pt has good sats but she reports that  she is having a hard timer breathing very cold extremities

## 2021-08-07 NOTE — ED Notes (Signed)
Report called to rn on 2900 rn

## 2021-08-08 ENCOUNTER — Encounter (HOSPITAL_COMMUNITY): Payer: Self-pay | Admitting: Cardiology

## 2021-08-08 ENCOUNTER — Inpatient Hospital Stay (HOSPITAL_COMMUNITY)
Admission: EM | Disposition: A | Discharge status: Home / Self Care | Payer: Self-pay | Source: Home / Self Care | Attending: Cardiology

## 2021-08-08 DIAGNOSIS — I442 Atrioventricular block, complete: Secondary | ICD-10-CM | POA: Diagnosis not present

## 2021-08-08 HISTORY — PX: PACEMAKER IMPLANT: EP1218

## 2021-08-08 LAB — ECHOCARDIOGRAM COMPLETE
AR max vel: 0.78 cm2
AV Area VTI: 0.9 cm2
AV Area mean vel: 0.84 cm2
AV Mean grad: 17 mmHg
AV Peak grad: 31.6 mmHg
Ao pk vel: 2.81 m/s
S' Lateral: 2.3 cm

## 2021-08-08 LAB — GLUCOSE, CAPILLARY
Glucose-Capillary: 208 mg/dL — ABNORMAL HIGH (ref 70–99)
Glucose-Capillary: 157 mg/dL — ABNORMAL HIGH (ref 70–99)
Glucose-Capillary: 133 mg/dL — ABNORMAL HIGH (ref 70–99)
Glucose-Capillary: 209 mg/dL — ABNORMAL HIGH (ref 70–99)

## 2021-08-08 SURGERY — PACEMAKER IMPLANT

## 2021-08-08 MED ORDER — DORZOLAMIDE HCL 2 % OP SOLN
1.0000 [drp] | Freq: Two times a day (BID) | OPHTHALMIC | Status: DC
  Administered 2021-08-08 – 2021-08-10 (×4): 1 [drp] via OPHTHALMIC
  Filled 2021-08-08: qty 10

## 2021-08-08 MED ORDER — SODIUM CHLORIDE 0.9% FLUSH
3.0000 mL | Freq: Two times a day (BID) | INTRAVENOUS | Status: DC
  Administered 2021-08-08 – 2021-08-09 (×4): 3 mL via INTRAVENOUS

## 2021-08-08 MED ORDER — CEFAZOLIN SODIUM-DEXTROSE 2-4 GM/100ML-% IV SOLN
INTRAVENOUS | Status: AC
  Filled 2021-08-08: qty 100

## 2021-08-08 MED ORDER — SODIUM CHLORIDE 0.9 % IV SOLN: INTRAVENOUS | Status: DC

## 2021-08-08 MED ORDER — ADULT MULTIVITAMIN W/MINERALS CH
1.0000 | ORAL_TABLET | Freq: Every day | ORAL | Status: DC
  Administered 2021-08-09 – 2021-08-10 (×2): 1 via ORAL
  Filled 2021-08-08 (×2): qty 1

## 2021-08-08 MED ORDER — CARVEDILOL 12.5 MG PO TABS: 12.5000 mg | ORAL_TABLET | Freq: Two times a day (BID) | ORAL | Status: DC

## 2021-08-08 MED ORDER — SODIUM CHLORIDE 0.9% FLUSH: 3.0000 mL | INTRAVENOUS | Status: DC | PRN

## 2021-08-08 MED ORDER — CEFAZOLIN SODIUM-DEXTROSE 2-4 GM/100ML-% IV SOLN
2.0000 g | INTRAVENOUS | Status: AC
  Administered 2021-08-08: 2 g via INTRAVENOUS
  Filled 2021-08-08: qty 100

## 2021-08-08 MED ORDER — CHLORHEXIDINE GLUCONATE 4 % EX LIQD: 60.0000 mL | Freq: Once | CUTANEOUS | Status: DC

## 2021-08-08 MED ORDER — CARVEDILOL 12.5 MG PO TABS
12.5000 mg | ORAL_TABLET | Freq: Two times a day (BID) | ORAL | Status: DC
  Administered 2021-08-08 – 2021-08-10 (×4): 12.5 mg via ORAL
  Filled 2021-08-08 (×4): qty 1

## 2021-08-08 MED ORDER — FENTANYL CITRATE (PF) 100 MCG/2ML IJ SOLN
INTRAMUSCULAR | Status: DC | PRN
  Administered 2021-08-08: 25 ug via INTRAVENOUS

## 2021-08-08 MED ORDER — MIDAZOLAM HCL 5 MG/5ML IJ SOLN
INTRAMUSCULAR | Status: DC | PRN
  Administered 2021-08-08: 1 mg via INTRAVENOUS

## 2021-08-08 MED ORDER — LIDOCAINE HCL 1 % IJ SOLN
INTRAMUSCULAR | Status: AC
  Filled 2021-08-08: qty 60

## 2021-08-08 MED ORDER — ONDANSETRON HCL 4 MG/2ML IJ SOLN: 4.0000 mg | Freq: Four times a day (QID) | INTRAMUSCULAR | Status: DC | PRN

## 2021-08-08 MED ORDER — ACETAMINOPHEN 325 MG PO TABS
325.0000 mg | ORAL_TABLET | ORAL | Status: DC | PRN
  Administered 2021-08-08 – 2021-08-10 (×2): 650 mg via ORAL
  Filled 2021-08-08 (×2): qty 2

## 2021-08-08 MED ORDER — LIDOCAINE HCL (PF) 1 % IJ SOLN
INTRAMUSCULAR | Status: DC | PRN
  Administered 2021-08-08: 60 mL

## 2021-08-08 MED ORDER — HYDRALAZINE HCL 25 MG PO TABS
25.0000 mg | ORAL_TABLET | Freq: Once | ORAL | Status: AC
  Administered 2021-08-08: 25 mg via ORAL
  Filled 2021-08-08: qty 1

## 2021-08-08 MED ORDER — SODIUM CHLORIDE 0.9 % IV SOLN
250.0000 mL | INTRAVENOUS | Status: DC
  Administered 2021-08-08: 250 mL via INTRAVENOUS

## 2021-08-08 MED ORDER — METFORMIN HCL 500 MG PO TABS
1000.0000 mg | ORAL_TABLET | Freq: Two times a day (BID) | ORAL | Status: DC
  Administered 2021-08-09: 1000 mg via ORAL
  Filled 2021-08-08 (×3): qty 2

## 2021-08-08 MED ORDER — ALBUTEROL SULFATE (2.5 MG/3ML) 0.083% IN NEBU: 3.0000 mL | INHALATION_SOLUTION | Freq: Four times a day (QID) | RESPIRATORY_TRACT | Status: DC | PRN

## 2021-08-08 MED ORDER — EMPAGLIFLOZIN 10 MG PO TABS
10.0000 mg | ORAL_TABLET | Freq: Every morning | ORAL | Status: DC
  Administered 2021-08-09 – 2021-08-10 (×2): 10 mg via ORAL
  Filled 2021-08-08 (×2): qty 1

## 2021-08-08 MED ORDER — SODIUM CHLORIDE 0.9 % IV SOLN
80.0000 mg | INTRAVENOUS | Status: AC
  Administered 2021-08-08: 80 mg
  Filled 2021-08-08: qty 2

## 2021-08-08 MED ORDER — SODIUM CHLORIDE 0.9 % IV SOLN
INTRAVENOUS | Status: AC
  Filled 2021-08-08: qty 2

## 2021-08-08 MED ORDER — INSULIN GLARGINE-YFGN 100 UNIT/ML ~~LOC~~ SOLN
15.0000 [IU] | Freq: Every day | SUBCUTANEOUS | Status: DC
Start: 2021-08-08 — End: 2021-08-09
  Administered 2021-08-08: 15 [IU] via SUBCUTANEOUS
  Filled 2021-08-08 (×2): qty 0.15

## 2021-08-08 MED ORDER — FENTANYL CITRATE (PF) 100 MCG/2ML IJ SOLN
INTRAMUSCULAR | Status: AC
  Filled 2021-08-08: qty 2

## 2021-08-08 MED ORDER — MIDAZOLAM HCL 5 MG/5ML IJ SOLN
INTRAMUSCULAR | Status: AC
  Filled 2021-08-08: qty 5

## 2021-08-08 MED ORDER — HEPARIN (PORCINE) IN NACL 1000-0.9 UT/500ML-% IV SOLN
INTRAVENOUS | Status: DC | PRN
  Administered 2021-08-08: 500 mL

## 2021-08-08 SURGICAL SUPPLY — 10 items
CABLE SURGICAL S-101-97-12 (CABLE) ×2 IMPLANT
LEAD TENDRIL MRI 52CM LPA1200M (Lead) ×2 IMPLANT
LEAD TENDRIL MRI 58CM LPA1200M (Lead) ×2 IMPLANT
MAT PREVALON FULL STRYKER (MISCELLANEOUS) ×2 IMPLANT
PACEMAKER ASSURITY DR-RF (Pacemaker) ×2 IMPLANT
PAD PRO RADIOLUCENT 2001M-C (PAD) ×2 IMPLANT
POUCH AIGIS-R ANTIBACT PPM (Mesh General) ×2 IMPLANT
SHEATH 8FR PRELUDE SNAP 13 (SHEATH) ×4 IMPLANT
SHEATH PROBE COVER 6X72 (BAG) ×2 IMPLANT
TRAY PACEMAKER INSERTION (PACKS) ×2 IMPLANT

## 2021-08-08 NOTE — Progress Notes (Signed)
Progress Note  Patient Name: Amanda Stewart Date of Encounter: 08/08/2021  [facility] Cardiologist: Dr. Algie Coffer  Subjective   A little nervous about PPM implant, otherwise feels OK  Inpatient Medications    Scheduled Meds:  amLODipine  5 mg Oral Daily   aspirin EC  81 mg Oral Daily   atorvastatin  40 mg Oral Daily   Chlorhexidine Gluconate Cloth  6 each Topical Daily   heparin  5,000 Units Subcutaneous Q8H   [START ON 08/09/2021] influenza vaccine adjuvanted  0.5 mL Intramuscular Tomorrow-1000   insulin aspart  0-15 Units Subcutaneous TID WC   losartan  100 mg Oral Daily   nitroGLYCERIN  0.5 inch Topical Q6H   pantoprazole  40 mg Oral Q0600   Continuous Infusions:  sodium chloride 10 mL/hr at 08/08/21 0000   isoproterenol (ISUPREL) infusion     PRN Meds: acetaminophen, nitroGLYCERIN, ondansetron (ZOFRAN) IV   Vital Signs    Vitals:   08/08/21 0400 08/08/21 0500 08/08/21 0600 08/08/21 0611  BP: (!) 162/49 (!) 137/50 (!) 170/53 (!) 152/48  Pulse: (!) 59 (!) 59 (!) 59 (!) 57  Resp: (!) 24 19 (!) 23 20  Temp: 99.1 F (37.3 C)     TempSrc:      SpO2: 97% 99% 98% 100%  Weight:   80.8 kg   Height:        Intake/Output Summary (Last 24 hours) at 08/08/2021 0757 Last data filed at 08/08/2021 0000 Gross per 24 hour  Intake 190.38 ml  Output 250 ml  Net -59.62 ml   Last 3 Weights 08/08/2021 08/07/2021 11/05/2015  Weight (lbs) 178 lb 2.1 oz 178 lb 5.6 oz 171 lb  Weight (kg) 80.8 kg 80.9 kg 77.565 kg      Telemetry    CHB, asynchronous V pacing - Personally Reviewed  ECG    V pacing (CHB underneath) - Personally Reviewed  Physical Exam   GEN: No acute distress.   Neck: No JVD Cardiac: RRR, loud SM, rubs, or gallops.  Respiratory: CTA b/l. GI: Soft, nontender, non-distended  MS: No edema; No deformity. Neuro:  Nonfocal  Psych: Normal affect   Labs    High Sensitivity Troponin:   Recent Labs  Lab 08/07/21 0153 08/07/21 0634 08/07/21 0912   TROPONINIHS 26* 32* 85*      Chemistry Recent Labs  Lab 08/07/21 0153 08/07/21 0630 08/07/21 0634  NA 141 144 142  K 4.0 4.3 4.3  CL 108  --  110  CO2 24  --  23  GLUCOSE 286*  --  337*  BUN 18  --  20  CREATININE 1.08*  --  1.16*  CALCIUM 9.0  --  8.8*  PROT 6.6  --   --   ALBUMIN 3.5  --   --   AST 80*  --   --   ALT 68*  --   --   ALKPHOS 103  --   --   BILITOT 1.1  --   --   GFRNONAA 51*  --  47*  ANIONGAP 9  --  9     Hematology Recent Labs  Lab 08/07/21 0153 08/07/21 0630 08/07/21 0634  WBC 11.0*  --  10.8*  RBC 3.59*  --  3.36*  HGB 11.5* 10.5* 10.7*  HCT 36.1 31.0* 33.0*  MCV 100.6*  --  98.2  MCH 32.0  --  31.8  MCHC 31.9  --  32.4  RDW 14.8  --  14.7  PLT 129*  --  164    BNP Recent Labs  Lab 08/07/21 0154  BNP 571.2*     DDimer No results for input(s): DDIMER in the last 168 hours.   Radiology    DG Chest Portable 1 View  Result Date: 08/07/2021 CLINICAL DATA:  Shortness of breath. EXAM: PORTABLE CHEST 1 VIEW COMPARISON:  Chest radiograph dated 12/10/2014. FINDINGS: No focal consolidation, pleural effusion, or pneumothorax. Stable cardiac silhouette. No acute osseous pathology. Degenerative changes of the spine and shoulders. IMPRESSION: No active disease. Electronically Signed   By: Elgie Collard M.D.   On: 08/07/2021 01:23    Cardiac Studies    12/11/2014: TTE Study Conclusions  - Left ventricle: The cavity size was normal. There was severe    concentric hypertrophy. Systolic function was vigorous. The    estimated ejection fraction was in the range of 65% to 70%. Wall    motion was normal; there were no regional wall motion    abnormalities. Doppler parameters are consistent with abnormal    left ventricular relaxation (grade 1 diastolic dysfunction).  - Aortic valve: Valve mobility was mildly restricted. Transvalvular    velocity was increased, due to stenosis. There was mild stenosis.    There was trivial regurgitation. Valve  area (VTI): 1.49 cm^2.    Valve area (Vmax): 1.48 cm^2. Valve area (Vmean): 1.56 cm^2.  - Mitral valve: Calcified annulus.    12/12/2014: stress myoview IMPRESSION: 1. Non reversible decreased myocardial perfusion involving inferolateral wall of the LEFT ventricle new since previous exam. 2. Global hypokinesia of the LEFT ventricle. 3. Left ventricular ejection fraction 26% 4. Low -risk stress test findings*.    05/26/2014: R/LHC LEFT VENTRICULOGRAM:  Left ventricular angiogram was not done to conserve dye [email or web address] LV systolic dysfunction with an estimated ejection fraction of 45% by echocardiogram.  LVEDP was 17 mmHg.   IMPRESSION OF HEART CATHETERIZATION:   Normal left main coronary artery. Normal left anterior descending artery and its branches. 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 Normal right coronary artery. Normal left ventricular systolic function.  LVEDP 17 mmHg.  Ej. fraction  45 %. Mild to moderately elevated PA and wedge pressures.   RECOMMENDATION:   Medical treatment. NTG drip +/- inotropic support.  Patient Profile     80 y.o. female CAD (2015 w/severe distal LCX disease, treated medically), hx of cardiac arrest in environment of MI treated medically (2015), LBBB, HTN, HLD, DM, chronic CHF (systolic/diastolic)  Admitted yesterday with sudden onset of SOB, dizziness, no CP, found in CHB and urgent temp pacing wire was placed Admitted her home coreg held  Assessment & Plan    CHB Home coreg held, none yesterday, remains in CHB LBBB at baseline w/RBBB escape She will need PPM  Echo is completed, pending read She has a loud SM, sounds of AS Hx of LV dysfunction Images to me looks like EF is OK, + LVH  Await read, reviewed with Dr. Lalla Brothers Dr. Lalla Brothers has seen and examined the patient Discussed PPM implant procedure, potential risks and benefits, she remains agreeable   2. CAD No CP HX Trop 26, 32, 85 C/w attending  cardiologist      For questions or updates, please contact [facility] Please consult [email or web address] for contact info under        Signed, Sheilah Pigeon, PA-C  08/08/2021, 7:57 AM

## 2021-08-08 NOTE — Progress Notes (Signed)
Inpatient Diabetes Program Recommendations  AACE/ADA: New Consensus Statement on Inpatient Glycemic Control   Target Ranges:  Prepandial:   less than 140 mg/dL      Peak postprandial:   less than 180 mg/dL (1-2 hours)      Critically ill patients:  140 - 180 mg/dL   Results for [place], Jessicamarie J (MRN [id number]) as of 08/08/2021 11:48  Ref. Range 08/07/2021 11:46 08/07/2021 15:32 08/07/2021 21:34 08/08/2021 06:21 08/08/2021 11:19  Glucose-Capillary Latest Ref Range: 70 - 99 mg/dL 343 (H) 568 (H) 616 (H) 208 (H) 157 (H)    Review of Glycemic Control  Diabetes history: DM2 Outpatient Diabetes medications: Amaryl 2 mg daily, Jardiance 10 mg QAM, Tradjenta 5 mg daily, Metformin 1000 mg BID Current orders for Inpatient glycemic control: Novolog 0-15 units TID with meals  Inpatient Diabetes Program Recommendations:    Insulin: Please consider ordering Semglee 5 units Q24H.  Thanks, Orlando Penner, RN, MSN, CDE Diabetes Coordinator Inpatient Diabetes Program [phone] (Team Pager from 8am to 5pm)

## 2021-08-08 NOTE — Discharge Instructions (Signed)
    Supplemental Discharge Instructions for  Pacemaker/Defibrillator Patients  Activity No heavy lifting or vigorous activity with your left/right arm for 6 to 8 weeks.  Do not raise your left/right arm above your head for one week.  Gradually raise your affected arm as drawn below.              08/13/21                    08/14/21                   08/15/21                  08/16/21 __  NO DRIVING for  1 week   ; you may begin driving on   3/41/96  .  WOUND CARE Keep the wound area clean and dry.  Do not get this area wet , no showers for one week; you may shower on   08/16/21  . The tape/steri-strips on your wound will fall off; do not pull them off.  No bandage is needed on the site.  DO  NOT apply any creams, oils, or ointments to the wound area. If you notice any drainage or discharge from the wound, any swelling or bruising at the site, or you develop a fever > 101? F after you are discharged home, call the office at once.  Special Instructions You are still able to use cellular telephones; use the ear opposite the side where you have your pacemaker/defibrillator.  Avoid carrying your cellular phone near your device. When traveling through airports, show security personnel your identification card to avoid being screened in the metal detectors.  Ask the security personnel to use the hand wand. Avoid arc welding equipment, MRI testing (magnetic resonance imaging), TENS units (transcutaneous nerve stimulators).  Call the office for questions about other devices. Avoid electrical appliances that are in poor condition or are not properly grounded. Microwave ovens are safe to be near or to operate.

## 2021-08-08 NOTE — Progress Notes (Signed)
Ref: Fleet Contras, MD   Subjective:  Awake. Mostly paced rhythm/. VS stable.  Blood glucose running high. Patient aware of moderate to severe aortic valve stenosis.  Objective:  Vital Signs in the last 24 hours: Temp:  [97.6 F (36.4 C)-99.7 F (37.6 C)] 99.7 F (37.6 C) (09/12 1700) Pulse Rate:  [0-112] 84 (09/12 1700) Cardiac Rhythm: Ventricular paced (09/12 1200) Resp:  [0-86] 18 (09/12 1700) BP: (117-203)/(46-106) 146/67 (09/12 1700) SpO2:  [91 %-100 %] 98 % (09/12 1700) Weight:  [80.8 kg-80.9 kg] 80.8 kg (09/12 0600)  Physical Exam: BP Readings from Last 1 Encounters:  08/08/21 (!) 146/67     Wt Readings from Last 1 Encounters:  08/08/21 80.8 kg    Weight change:  Body mass index is 29.64 kg/m. HEENT: [place]/AT, Eyes-Brown, Conjunctiva-Pink, Sclera-Non-icteric Neck: No JVD, No bruit, Trachea midline. Lungs:  Clear, Bilateral. Cardiac:  Regular rhythm, normal S1 and S2, no S3. IV/VI systolic murmur. Abdomen:  Soft, non-tender. BS present. Extremities:  No edema present. No cyanosis. No clubbing. CNS: AxOx3, Cranial nerves grossly intact, moves all 4 extremities.  Skin: Warm and dry.   Intake/Output from previous day: 09/11 0701 - 09/12 0700 In: 190.4 [I.V.:190.4] Out: 250 [Urine:250]    Lab Results: BMET    Component Value Date/Time   NA 142 08/07/2021 0634   NA 144 08/07/2021 0630   NA 141 08/07/2021 0153   K 4.3 08/07/2021 0634   K 4.3 08/07/2021 0630   K 4.0 08/07/2021 0153   CL 110 08/07/2021 0634   CL 108 08/07/2021 0153   CL 106 01/05/2021 0948   CO2 23 08/07/2021 0634   CO2 24 08/07/2021 0153   CO2 23 01/05/2021 0948   GLUCOSE 337 (H) 08/07/2021 0634   GLUCOSE 286 (H) 08/07/2021 0153   GLUCOSE 213 (H) 01/05/2021 0948   BUN 20 08/07/2021 0634   BUN 18 08/07/2021 0153   BUN 13 01/05/2021 0948   CREATININE 1.16 (H) 08/07/2021 0634   CREATININE 1.08 (H) 08/07/2021 0153   CREATININE 0.91 (H) 01/05/2021 0948   CREATININE 0.69 11/07/2015 0945    CALCIUM 8.8 (L) 08/07/2021 0634   CALCIUM 9.0 08/07/2021 0153   CALCIUM 9.1 01/05/2021 0948   GFRNONAA 47 (L) 08/07/2021 0634   GFRNONAA 51 (L) 08/07/2021 0153   GFRNONAA 59 (L) 01/05/2021 0948   GFRNONAA >60 11/07/2015 0945   GFRNONAA 53 (L) 11/04/2015 1209   GFRNONAA 44 (L) 12/11/2014 0400   GFRAA 69 01/05/2021 0948   GFRAA >60 11/07/2015 0945   GFRAA >60 11/04/2015 1209   GFRAA 51 (L) 12/11/2014 0400   CBC    Component Value Date/Time   WBC 10.8 (H) 08/07/2021 0634   RBC 3.36 (L) 08/07/2021 0634   HGB 10.7 (L) 08/07/2021 0634   HCT 33.0 (L) 08/07/2021 0634   PLT 164 08/07/2021 0634   MCV 98.2 08/07/2021 0634   MCH 31.8 08/07/2021 0634   MCHC 32.4 08/07/2021 0634   RDW 14.7 08/07/2021 0634   LYMPHSABS 1.4 08/07/2021 0153   MONOABS 0.8 08/07/2021 0153   EOSABS 0.1 08/07/2021 0153   BASOSABS 0.1 08/07/2021 0153   HEPATIC Function Panel Recent Labs    01/05/21 0948 08/07/21 0153  PROT 7.1 6.6   HEMOGLOBIN A1C No components found for: HGA1C,  MPG CARDIAC ENZYMES Lab Results  Component Value Date   CKTOTAL 12,011 (H) 11/07/2015   CKMB 59.7 (H) 11/04/2015   TROPONINI <0.03 12/11/2014   TROPONINI <0.03 12/11/2014   TROPONINI <0.03  12/10/2014   BNP No results for input(s): PROBNP in the last 8760 hours. TSH Recent Labs    01/05/21 0948 08/07/21 0634  TSH 1.22 0.889   CHOLESTEROL Recent Labs    01/05/21 0948 08/07/21 0634  CHOL 233* 131    Scheduled Meds:  amLODipine  5 mg Oral Daily   aspirin EC  81 mg Oral Daily   atorvastatin  40 mg Oral Daily   carvedilol  12.5 mg Oral BID WC   Chlorhexidine Gluconate Cloth  6 each Topical Daily   dorzolamide  1 drop Both Eyes BID   [START ON 08/09/2021] empagliflozin  10 mg Oral q morning   [START ON 08/09/2021] influenza vaccine adjuvanted  0.5 mL Intramuscular Tomorrow-1000   insulin aspart  0-15 Units Subcutaneous TID WC   insulin glargine-yfgn  15 Units Subcutaneous QHS   losartan  100 mg Oral Daily    [START ON 08/09/2021] metFORMIN  1,000 mg Oral BID WC   multivitamin with minerals  1 tablet Oral Daily   nitroGLYCERIN  0.5 inch Topical Q6H   pantoprazole  40 mg Oral Q0600   sodium chloride flush  3 mL Intravenous Q12H   Continuous Infusions:  sodium chloride 10 mL/hr at 08/08/21 1300   PRN Meds:.acetaminophen, albuterol, nitroGLYCERIN, ondansetron (ZOFRAN) IV  Assessment/Plan:  Complete heart block Permanent pacemaker placement Type 2 DM with hyperglycemia HTN Aortic valve stenosis, moderate to severe CAD HLD  Plan: Adjust Insulin dosage. Add some of home medications Additional cardiac interventions in future. Increase activity as tolerated. F/U in 1 week post discharge.   LOS: 1 day   Time spent including chart review, lab review, examination, discussion with patient.Family/Nurse/doctor : 30 min   [name]  MD  08/08/2021, 5:18 PM

## 2021-08-09 ENCOUNTER — Encounter (HOSPITAL_COMMUNITY): Payer: Self-pay | Admitting: Cardiology

## 2021-08-09 ENCOUNTER — Inpatient Hospital Stay (HOSPITAL_COMMUNITY): Payer: Medicare Other

## 2021-08-09 DIAGNOSIS — I442 Atrioventricular block, complete: Secondary | ICD-10-CM | POA: Diagnosis not present

## 2021-08-09 DIAGNOSIS — Z950 Presence of cardiac pacemaker: Secondary | ICD-10-CM

## 2021-08-09 LAB — GLUCOSE, CAPILLARY
Glucose-Capillary: 130 mg/dL — ABNORMAL HIGH (ref 70–99)
Glucose-Capillary: 155 mg/dL — ABNORMAL HIGH (ref 70–99)
Glucose-Capillary: 88 mg/dL (ref 70–99)
Glucose-Capillary: 108 mg/dL — ABNORMAL HIGH (ref 70–99)

## 2021-08-09 MED ORDER — INSULIN GLARGINE-YFGN 100 UNIT/ML ~~LOC~~ SOLN
10.0000 [IU] | Freq: Every day | SUBCUTANEOUS | Status: DC
Start: 2021-08-09 — End: 2021-08-10
  Administered 2021-08-09: 10 [IU] via SUBCUTANEOUS
  Filled 2021-08-09 (×3): qty 0.1

## 2021-08-09 MED ORDER — ISOSORBIDE MONONITRATE ER 30 MG PO TB24
30.0000 mg | ORAL_TABLET | Freq: Every day | ORAL | Status: DC
  Administered 2021-08-09 – 2021-08-10 (×2): 30 mg via ORAL
  Filled 2021-08-09 (×2): qty 1

## 2021-08-09 MED ORDER — AMLODIPINE BESYLATE 5 MG PO TABS
5.0000 mg | ORAL_TABLET | Freq: Two times a day (BID) | ORAL | Status: DC
  Administered 2021-08-09 – 2021-08-10 (×3): 5 mg via ORAL
  Filled 2021-08-09 (×3): qty 1

## 2021-08-09 MED ORDER — HYDRALAZINE HCL 25 MG PO TABS
25.0000 mg | ORAL_TABLET | Freq: Two times a day (BID) | ORAL | Status: DC
  Administered 2021-08-09 – 2021-08-10 (×3): 25 mg via ORAL
  Filled 2021-08-09 (×3): qty 1

## 2021-08-09 MED ORDER — GLIMEPIRIDE 2 MG PO TABS
2.0000 mg | ORAL_TABLET | Freq: Every day | ORAL | Status: DC
  Administered 2021-08-09 – 2021-08-10 (×2): 2 mg via ORAL
  Filled 2021-08-09 (×2): qty 1

## 2021-08-09 MED FILL — Lidocaine HCl Local Inj 1%: INTRAMUSCULAR | Qty: 60 | Status: AC

## 2021-08-09 NOTE — Progress Notes (Signed)
Ref: Fleet Contras, MD   Subjective:  Awake. Blood sugar levels are normalizing. Left pectoral pocket with minimal discomfort. Patient aware of moderate to severe AS and needs additional cardiac testing in few weeks. T max 99.7 degree F. Atrial sensed, V paced rhythm on EKG and Monitor.  Objective:  Vital Signs in the last 24 hours: Temp:  [97.5 F (36.4 C)-99.7 F (37.6 C)] 98.5 F (36.9 C) (09/13 0805) Pulse Rate:  [0-112] 72 (09/13 0805) Cardiac Rhythm: Ventricular paced (09/13 0700) Resp:  [0-86] 26 (09/13 0700) BP: (115-203)/(50-123) 180/71 (09/13 0805) SpO2:  [88 %-100 %] 100 % (09/13 0700) Weight:  [80.9 kg] 80.9 kg (09/13 0400)  Physical Exam: BP Readings from Last 1 Encounters:  08/09/21 (!) 180/71     Wt Readings from Last 1 Encounters:  08/09/21 80.9 kg    Weight change: 0 kg Body mass index is 29.68 kg/m. HEENT: [place]/AT, Eyes-Brown, Conjunctiva-Pink, Sclera-Non-icteric Neck: No JVD, No bruit, Trachea midline. Lungs:  Clear, Bilateral. Left pectoral pacer pocket. Cardiac:  Regular rhythm, normal S1 and S2, no S3. II/VI systolic murmur. Abdomen:  Soft, non-tender. BS present. Extremities:  No edema present. No cyanosis. No clubbing. CNS: AxOx3, Cranial nerves grossly intact, moves all 4 extremities.  Skin: Warm and dry.   Intake/Output from previous day: 09/12 0701 - 09/13 0700 In: 777.7 [P.O.:480; I.V.:297.7] Out: 1220 [Urine:1220]    Lab Results: BMET    Component Value Date/Time   NA 142 08/07/2021 0634   NA 144 08/07/2021 0630   NA 141 08/07/2021 0153   K 4.3 08/07/2021 0634   K 4.3 08/07/2021 0630   K 4.0 08/07/2021 0153   CL 110 08/07/2021 0634   CL 108 08/07/2021 0153   CL 106 01/05/2021 0948   CO2 23 08/07/2021 0634   CO2 24 08/07/2021 0153   CO2 23 01/05/2021 0948   GLUCOSE 337 (H) 08/07/2021 0634   GLUCOSE 286 (H) 08/07/2021 0153   GLUCOSE 213 (H) 01/05/2021 0948   BUN 20 08/07/2021 0634   BUN 18 08/07/2021 0153   BUN 13  01/05/2021 0948   CREATININE 1.16 (H) 08/07/2021 0634   CREATININE 1.08 (H) 08/07/2021 0153   CREATININE 0.91 (H) 01/05/2021 0948   CREATININE 0.69 11/07/2015 0945   CALCIUM 8.8 (L) 08/07/2021 0634   CALCIUM 9.0 08/07/2021 0153   CALCIUM 9.1 01/05/2021 0948   GFRNONAA 47 (L) 08/07/2021 0634   GFRNONAA 51 (L) 08/07/2021 0153   GFRNONAA 59 (L) 01/05/2021 0948   GFRNONAA >60 11/07/2015 0945   GFRNONAA 53 (L) 11/04/2015 1209   GFRNONAA 44 (L) 12/11/2014 0400   GFRAA 69 01/05/2021 0948   GFRAA >60 11/07/2015 0945   GFRAA >60 11/04/2015 1209   GFRAA 51 (L) 12/11/2014 0400   CBC    Component Value Date/Time   WBC 10.8 (H) 08/07/2021 0634   RBC 3.36 (L) 08/07/2021 0634   HGB 10.7 (L) 08/07/2021 0634   HCT 33.0 (L) 08/07/2021 0634   PLT 164 08/07/2021 0634   MCV 98.2 08/07/2021 0634   MCH 31.8 08/07/2021 0634   MCHC 32.4 08/07/2021 0634   RDW 14.7 08/07/2021 0634   LYMPHSABS 1.4 08/07/2021 0153   MONOABS 0.8 08/07/2021 0153   EOSABS 0.1 08/07/2021 0153   BASOSABS 0.1 08/07/2021 0153   HEPATIC Function Panel Recent Labs    01/05/21 0948 08/07/21 0153  PROT 7.1 6.6   HEMOGLOBIN A1C No components found for: HGA1C,  MPG CARDIAC ENZYMES Lab Results  Component Value Date  CKTOTAL 12,011 (H) 11/07/2015   CKMB 59.7 (H) 11/04/2015   TROPONINI <0.03 12/11/2014   TROPONINI <0.03 12/11/2014   TROPONINI <0.03 12/10/2014   BNP No results for input(s): PROBNP in the last 8760 hours. TSH Recent Labs    01/05/21 0948 08/07/21 0634  TSH 1.22 0.889   CHOLESTEROL Recent Labs    01/05/21 0948 08/07/21 0634  CHOL 233* 131    Scheduled Meds:  amLODipine  5 mg Oral BID   aspirin EC  81 mg Oral Daily   atorvastatin  40 mg Oral Daily   carvedilol  12.5 mg Oral BID WC   Chlorhexidine Gluconate Cloth  6 each Topical Daily   dorzolamide  1 drop Both Eyes BID   empagliflozin  10 mg Oral q morning   glimepiride  2 mg Oral Q breakfast   hydrALAZINE  25 mg Oral BID   influenza  vaccine adjuvanted  0.5 mL Intramuscular Tomorrow-1000   insulin aspart  0-15 Units Subcutaneous TID WC   insulin glargine-yfgn  10 Units Subcutaneous QHS   isosorbide mononitrate  30 mg Oral Daily   losartan  100 mg Oral Daily   metFORMIN  1,000 mg Oral BID WC   multivitamin with minerals  1 tablet Oral Daily   pantoprazole  40 mg Oral Q0600   sodium chloride flush  3 mL Intravenous Q12H   Continuous Infusions:  sodium chloride 10 mL/hr at 08/08/21 1300   PRN Meds:.acetaminophen, albuterol, nitroGLYCERIN, ondansetron (ZOFRAN) IV  Assessment/Plan:  Complete heart block Permanent pacemaker placement Atrial sensed, V-paced rhythm Type 2 DM HTN Aortic valve stenosis, moderate to severe CAD HLD PVD  Plan:  Add oral hypoglycemic agents. Decrease insulin dose.   LOS: 2 days   Time spent including chart review, lab review, examination, discussion with patient/Nurse : 30 min   [name]  MD  08/09/2021, 9:07 AM

## 2021-08-09 NOTE — Progress Notes (Addendum)
Progress Note  Patient Name: Amanda Stewart Date of Encounter: 08/09/2021  [facility] Cardiologist: Dr. Algie Coffer  Subjective   Feels much better this AM, no CP, no SOB, minimal site discomfort  Inpatient Medications    Scheduled Meds:  amLODipine  5 mg Oral Daily   aspirin EC  81 mg Oral Daily   atorvastatin  40 mg Oral Daily   carvedilol  12.5 mg Oral BID WC   Chlorhexidine Gluconate Cloth  6 each Topical Daily   dorzolamide  1 drop Both Eyes BID   empagliflozin  10 mg Oral q morning   influenza vaccine adjuvanted  0.5 mL Intramuscular Tomorrow-1000   insulin aspart  0-15 Units Subcutaneous TID WC   insulin glargine-yfgn  15 Units Subcutaneous QHS   losartan  100 mg Oral Daily   metFORMIN  1,000 mg Oral BID WC   multivitamin with minerals  1 tablet Oral Daily   nitroGLYCERIN  0.5 inch Topical Q6H   pantoprazole  40 mg Oral Q0600   sodium chloride flush  3 mL Intravenous Q12H   Continuous Infusions:  sodium chloride 10 mL/hr at 08/08/21 1300   PRN Meds: acetaminophen, albuterol, nitroGLYCERIN, ondansetron (ZOFRAN) IV   Vital Signs    Vitals:   08/09/21 0500 08/09/21 0600 08/09/21 0700 08/09/21 0805  BP: (!) 182/73 (!) 174/101 (!) 174/123 (!) 180/71  Pulse: 76 80 81 72  Resp: (!) 26 18 (!) 26   Temp:      TempSrc:      SpO2: 96% 97% 100%   Weight:      Height:        Intake/Output Summary (Last 24 hours) at 08/09/2021 0813 Last data filed at 08/09/2021 0700 Gross per 24 hour  Intake 697.7 ml  Output 1120 ml  Net -422.3 ml   Last 3 Weights 08/09/2021 08/08/2021 08/07/2021  Weight (lbs) 178 lb 5.6 oz 178 lb 2.1 oz 178 lb 5.6 oz  Weight (kg) 80.9 kg 80.8 kg 80.9 kg      Telemetry    SR, often AP/VS - Personally Reviewed  ECG    SR/V pacing 83bpm- Personally Reviewed  Physical Exam   GEN: No acute distress.   Neck: No JVD Cardiac: RRR, loud SM, rubs, or gallops.  Respiratory: CTA b/l. GI: Soft, nontender, non-distended  MS: No edema; No  deformity. Neuro:  Nonfocal  Psych: Normal affect   PPM site is stable, no bleeding or hematoma  Labs    High Sensitivity Troponin:   Recent Labs  Lab 08/07/21 0153 08/07/21 0634 08/07/21 0912  TROPONINIHS 26* 32* 85*      Chemistry Recent Labs  Lab 08/07/21 0153 08/07/21 0630 08/07/21 0634  NA 141 144 142  K 4.0 4.3 4.3  CL 108  --  110  CO2 24  --  23  GLUCOSE 286*  --  337*  BUN 18  --  20  CREATININE 1.08*  --  1.16*  CALCIUM 9.0  --  8.8*  PROT 6.6  --   --   ALBUMIN 3.5  --   --   AST 80*  --   --   ALT 68*  --   --   ALKPHOS 103  --   --   BILITOT 1.1  --   --   GFRNONAA 51*  --  47*  ANIONGAP 9  --  9     Hematology Recent Labs  Lab 08/07/21 0153 08/07/21 0630 08/07/21 3818  WBC 11.0*  --  10.8*  RBC 3.59*  --  3.36*  HGB 11.5* 10.5* 10.7*  HCT 36.1 31.0* 33.0*  MCV 100.6*  --  98.2  MCH 32.0  --  31.8  MCHC 31.9  --  32.4  RDW 14.8  --  14.7  PLT 129*  --  164    BNP Recent Labs  Lab 08/07/21 0154  BNP 571.2*     DDimer No results for input(s): DDIMER in the last 168 hours.   Radiology       Cardiac Studies    08/07/21: TTE IMPRESSIONS   1. Left ventricular ejection fraction, by estimation, is 70 to 75%. The  left ventricle has hyperdynamic function. The left ventricle has no  regional wall motion abnormalities. There is mild concentric left  ventricular hypertrophy. Left ventricular  diastolic parameters are consistent with Grade II diastolic dysfunction  (pseudonormalization).   2. Right ventricular systolic function is normal. The right ventricular  size is normal. There is moderately elevated pulmonary artery systolic  pressure.   3. Left atrial size was mild to moderately dilated.   4. Right atrial size was mildly dilated.   5. A small pericardial effusion is present. The pericardial effusion is  circumferential. There is no evidence of cardiac tamponade.   6. The mitral valve is degenerative. Mild to moderate  mitral valve  regurgitation. Moderate mitral annular calcification.   7. Tricuspid valve regurgitation is moderate.   8. The aortic valve is tricuspid. There is moderate calcification of the  aortic valve. There is moderate thickening of the aortic valve. Aortic  valve regurgitation is trivial. Moderate aortic valve stenosis.   9. There is Moderate (Grade III) atheroma plaque involving the aortic  root and ascending aorta.  10. The inferior vena cava is normal in size with <50% respiratory  variability, suggesting right atrial pressure of 8 mmHg.    12/11/2014: TTE Study Conclusions  - Left ventricle: The cavity size was normal. There was severe    concentric hypertrophy. Systolic function was vigorous. The    estimated ejection fraction was in the range of 65% to 70%. Wall    motion was normal; there were no regional wall motion    abnormalities. Doppler parameters are consistent with abnormal    left ventricular relaxation (grade 1 diastolic dysfunction).  - Aortic valve: Valve mobility was mildly restricted. Transvalvular    velocity was increased, due to stenosis. There was mild stenosis.    There was trivial regurgitation. Valve area (VTI): 1.49 cm^2.    Valve area (Vmax): 1.48 cm^2. Valve area (Vmean): 1.56 cm^2.  - Mitral valve: Calcified annulus.    12/12/2014: stress myoview IMPRESSION: 1. Non reversible decreased myocardial perfusion involving inferolateral wall of the LEFT ventricle new since previous exam. 2. Global hypokinesia of the LEFT ventricle. 3. Left ventricular ejection fraction 26% 4. Low -risk stress test findings*.    05/26/2014: R/LHC LEFT VENTRICULOGRAM:  Left ventricular angiogram was not done to conserve dye [email or web address] LV systolic dysfunction with an estimated ejection fraction of 45% by echocardiogram.  LVEDP was 17 mmHg.   IMPRESSION OF HEART CATHETERIZATION:   Normal left main coronary artery. Normal left anterior descending artery and its  branches. 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 Normal right coronary artery. Normal left ventricular systolic function.  LVEDP 17 mmHg.  Ej. fraction  45 %. Mild to moderately elevated PA and wedge pressures.   RECOMMENDATION:   Medical treatment. NTG  drip +/- inotropic support.  Patient Profile     80 y.o. 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 Lalla Brothers Site is stable CXR this Am without ptx PPM interrogation this AM with stable measurements Wound care and activity restrictions were discussed with the patient Routine post implant follow up is in place  Dr. Lalla Brothers has seen and examined the patient this AM, OK to discharge from EP perspective   2. CAD No CP HX Trop 26, 32, 85 C/w attending cardiologist  3. HTN C/w attending cardiologist  4. VHD Mod AS  5. LVH Described as mild Grade III DD Mod elevated PAP, 54.8 As per attending cardiologist    For questions or updates, please contact [facility] Please consult [email or web address] for contact info under        Signed, Sheilah Pigeon, PA-C  08/09/2021, 8:13 AM

## 2021-08-10 LAB — GLUCOSE, CAPILLARY
Glucose-Capillary: 92 mg/dL (ref 70–99)
Glucose-Capillary: 106 mg/dL — ABNORMAL HIGH (ref 70–99)

## 2021-08-10 MED ORDER — ACETAMINOPHEN 325 MG PO TABS: 325.0000 mg | ORAL_TABLET | ORAL | Status: DC | PRN

## 2021-08-10 MED ORDER — INSULIN GLARGINE-YFGN 100 UNIT/ML ~~LOC~~ SOLN
5.0000 [IU] | Freq: Every day | SUBCUTANEOUS | Status: DC
  Filled 2021-08-10: qty 0.05

## 2021-08-10 MED ORDER — LOSARTAN POTASSIUM 100 MG PO TABS: 100.0000 mg | ORAL_TABLET | Freq: Every day | ORAL | 3 refills | Status: DC

## 2021-08-10 MED ORDER — INSULIN GLARGINE 100 UNIT/ML SOLOSTAR PEN: 5.0000 [IU] | PEN_INJECTOR | Freq: Every day | SUBCUTANEOUS | 1 refills | Status: DC

## 2021-08-10 MED ORDER — PANTOPRAZOLE SODIUM 40 MG PO TBEC: 40.0000 mg | DELAYED_RELEASE_TABLET | Freq: Every day | ORAL | 3 refills | Status: DC

## 2021-08-10 NOTE — Discharge Summary (Signed)
Physician Discharge Summary  Patient ID: LEMA HEINKEL MRN: [id number] DOB/AGE: January 08, 1941 80 y.o.  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 She denied chest pain, palpitation or nausea. She had complete heart block with HR in 30's.  She had transcutaneous followed by temporary transvenous pacemaker placed followed by dual chamber permanent pacemaker. Her blood sugars were elevated and controlled with home medications as well as small dose of Lantus insulin.Her carvedilol was resumed post PPM placement.  She will see EP doctor as arranged and me in 1 week.                                                                                           Consults: cardiology and EP.  Significant Diagnostic Studies: labs: CBC with mildly elevated WBC cont, Hgb of 11.5, Platelets of 129 K.  BNP 571 pg. Normal TSH, minimally elevated troponin I from demand ischemia. Normal Lipid panel with LDL of 68 mg and HDL of 51 mg.  EKG: Complete heart block followed by paced rhythm.  Chest x-ray: Mild atelectatic changes.  Echocardiogram: Hyperdynamic with mild LVH and moderate to severe AS.  Pacemaker: Assurity DDD pacemaker.  Treatments: cardiac meds: carvedilol and Assurity DDD pacemaker  Discharge Exam: Blood  pressure (!) 151/68, pulse 73, temperature 98.6 F (37 C), resp. rate (!) 26, height 5\' 5"  (1.651 m), weight 80.9 kg, SpO2 97 %. General appearance: alert, cooperative and appears stated age. Head: Normocephalic, atraumatic. Eyes: Brown eyes, pink conjunctiva, corneas clear.  Neck: No adenopathy, no carotid bruit, no JVD, supple, symmetrical, trachea midline and thyroid not enlarged. Resp: Clear to auscultation bilaterally. Pacer pocket left pectoral area. Cardio: Regular rate and rhythm, S1, S2 normal, II/VI systolic murmur, no click, rub or gallop. GI: Soft, non-tender; bowel sounds normal; no organomegaly. Extremities: No edema, cyanosis or clubbing. Skin: Warm and dry.  Neurologic: Alert and oriented X 3, normal strength and tone. Normal coordination and slow gait.  Disposition: Discharge disposition: 01-Home or Self Care        Allergies as of 08/10/2021       Reactions   Metformin And Related Other (See Comments)   Diarrhea         Medication List     STOP taking these medications    linagliptin 5 MG Tabs tablet Commonly known as: TRADJENTA   lisinopril 5 MG tablet Commonly known as: ZESTRIL   metFORMIN 1000 MG tablet Commonly known  as: GLUCOPHAGE       TAKE these medications    acetaminophen 325 MG tablet Commonly known as: TYLENOL Take 1-2 tablets (325-650 mg total) by mouth every 4 (four) hours as needed for mild pain.   albuterol 108 (90 Base) MCG/ACT inhaler Commonly known as: VENTOLIN HFA Inhale 2 puffs into the lungs every 6 (six) hours as needed for wheezing or shortness of breath.   amLODipine 10 MG tablet Commonly known as: NORVASC Take 10 mg by mouth daily. What changed: Another medication with the same name was removed. Continue taking this medication, and follow the directions you see here.   aspirin EC 81 MG tablet Take 81 mg by mouth daily.   atorvastatin 40 MG tablet Commonly known as: LIPITOR Take 40 mg by mouth daily.    carvedilol 12.5 MG tablet Commonly known as: COREG Take 12.5 mg by mouth 2 (two) times daily with a meal.   cilostazol 100 MG tablet Commonly known as: PLETAL Take 100 mg by mouth daily.   dorzolamide 2 % ophthalmic solution Commonly known as: TRUSOPT Place 1 drop into both eyes 2 (two) times daily.   glimepiride 2 MG tablet Commonly known as: AMARYL Take 2 mg by mouth daily.   hydrALAZINE 50 MG tablet Commonly known as: APRESOLINE Take 50 mg by mouth 2 (two) times daily.   insulin glargine 100 UNIT/ML Solostar Pen Commonly known as: LANTUS Inject 5 Units into the skin at bedtime.   Jardiance 10 MG Tabs tablet Generic drug: empagliflozin Take 10 mg by mouth every morning.   losartan 100 MG tablet Commonly known as: COZAAR Take 1 tablet (100 mg total) by mouth daily.   multivitamin with minerals Tabs tablet Take 1 tablet by mouth daily.   nitroGLYCERIN 0.4 MG SL tablet Commonly known as: NITROSTAT Place 1 tablet (0.4 mg total) under the tongue every 5 (five) minutes x 3 doses as needed for chest pain.   pantoprazole 40 MG tablet Commonly known as: PROTONIX Take 1 tablet (40 mg total) by mouth daily at 6 (six) AM. Start taking on: August 11, 2021        Follow-up Information     [facility] Office Follow up.   Specialty: Cardiology Why: 08/18/21 @ 3:20PM, wound check visit Contact information: [address], [address] [place] [address] [phone]        Lanier Prude, MD Follow up.   Specialties: Cardiology, Radiology Why: 11/23/21 @ 3:30PM Contact information: [address] [place] [address] [phone]         [name], MD. Schedule an appointment as soon as possible for a visit in 1 week(s).   Specialty: Cardiology Contact information: [address] [place] [address] [phone]                 Time spent: Review of old chart, current chart, lab, x-ray,  cardiac tests and discussion with patient over 60 minutes.  Signed: [name] 08/10/2021, 10:53 AM

## 2021-08-18 ENCOUNTER — Ambulatory Visit (INDEPENDENT_AMBULATORY_CARE_PROVIDER_SITE_OTHER): Payer: Medicare Other

## 2021-08-18 ENCOUNTER — Other Ambulatory Visit: Payer: Self-pay

## 2021-08-18 DIAGNOSIS — I442 Atrioventricular block, complete: Secondary | ICD-10-CM

## 2021-08-18 NOTE — Patient Instructions (Addendum)

## 2021-08-19 LAB — CUP PACEART INCLINIC DEVICE CHECK
Battery Remaining Longevity: 127 mo
Battery Voltage: 3.1 V
Brady Statistic RA Percent Paced: 5.9 %
Brady Statistic RV Percent Paced: 99.34 %
Date Time Interrogation Session: 20220922151600
Implantable Lead Implant Date: 20220912
Implantable Lead Implant Date: 20220912
Implantable Lead Location: 753859
Implantable Lead Location: 753860
Implantable Pulse Generator Implant Date: 20220912
Lead Channel Impedance Value: 425 Ohm
Lead Channel Impedance Value: 562.5 Ohm
Lead Channel Pacing Threshold Amplitude: 0.5 V
Lead Channel Pacing Threshold Amplitude: 0.5 V
Lead Channel Pacing Threshold Pulse Width: 0.5 ms
Lead Channel Pacing Threshold Pulse Width: 0.5 ms
Lead Channel Sensing Intrinsic Amplitude: 5 mV
Lead Channel Setting Pacing Amplitude: 0.75 V
Lead Channel Setting Pacing Amplitude: 3.5 V
Lead Channel Setting Pacing Pulse Width: 0.5 ms
Lead Channel Setting Sensing Sensitivity: 2 mV
Pulse Gen Model: 2272

## 2021-08-19 NOTE — Progress Notes (Signed)
Wound check appointment. Steri-strips removed. Wound without redness or edema. Incision edges approximated, wound well healed. Normal device function. Thresholds, sensing, and impedances consistent with implant measurements. Device programmed at 3.5V/auto capture programmed on for extra safety margin until 3 month visit. 25 AMS EGMs for review. longest 08/16/21 for 4 min 58 sec. No OAC. Histogram distribution appropriate for patient and level of activity.  Patient educated about wound care, arm mobility, lifting restrictions. Patient enrolled in remote transmissions. ROV follow up with Dr. Lalla Brothers 11/23/21.

## 2021-11-07 ENCOUNTER — Ambulatory Visit (INDEPENDENT_AMBULATORY_CARE_PROVIDER_SITE_OTHER): Payer: Medicare Other

## 2021-11-07 DIAGNOSIS — I442 Atrioventricular block, complete: Secondary | ICD-10-CM

## 2021-11-08 LAB — CUP PACEART REMOTE DEVICE CHECK
Battery Remaining Longevity: 102 mo
Battery Remaining Percentage: 95.5 %
Battery Voltage: 3.01 V
Brady Statistic AP VP Percent: 2.1 %
Brady Statistic AP VS Percent: 1 %
Brady Statistic AS VP Percent: 98 %
Brady Statistic AS VS Percent: 1 %
Brady Statistic RA Percent Paced: 4.8 %
Brady Statistic RV Percent Paced: 99 %
Date Time Interrogation Session: 20221212020020
Implantable Lead Implant Date: 20220912
Implantable Lead Implant Date: 20220912
Implantable Lead Location: 753859
Implantable Lead Location: 753860
Implantable Pulse Generator Implant Date: 20220912
Lead Channel Impedance Value: 430 Ohm
Lead Channel Impedance Value: 580 Ohm
Lead Channel Pacing Threshold Amplitude: 0.5 V
Lead Channel Pacing Threshold Amplitude: 0.625 V
Lead Channel Pacing Threshold Pulse Width: 0.5 ms
Lead Channel Pacing Threshold Pulse Width: 0.5 ms
Lead Channel Sensing Intrinsic Amplitude: 12 mV
Lead Channel Sensing Intrinsic Amplitude: 4.1 mV
Lead Channel Setting Pacing Amplitude: 0.875
Lead Channel Setting Pacing Amplitude: 3.5 V
Lead Channel Setting Pacing Pulse Width: 0.5 ms
Lead Channel Setting Sensing Sensitivity: 2 mV
Pulse Gen Model: 2272
Pulse Gen Serial Number: 3956661

## 2021-11-17 NOTE — Progress Notes (Signed)
Remote pacemaker transmission.   

## 2021-11-23 ENCOUNTER — Encounter: Payer: Self-pay | Admitting: Cardiology

## 2021-11-23 ENCOUNTER — Ambulatory Visit (INDEPENDENT_AMBULATORY_CARE_PROVIDER_SITE_OTHER): Payer: Medicare Other | Admitting: Cardiology

## 2021-11-23 ENCOUNTER — Other Ambulatory Visit: Payer: Self-pay

## 2021-11-23 VITALS — BP 120/62 | HR 70 | Ht 65.0 in | Wt 178.6 lb

## 2021-11-23 DIAGNOSIS — Z950 Presence of cardiac pacemaker: Secondary | ICD-10-CM | POA: Insufficient documentation

## 2021-11-23 DIAGNOSIS — I442 Atrioventricular block, complete: Secondary | ICD-10-CM | POA: Diagnosis not present

## 2021-11-23 NOTE — Progress Notes (Signed)
Electrophysiology Office Follow up Visit Note:    Date:  11/23/2021   ID:  Amanda Stewart, DOB [birth year], MRN [id number]  PCP:  Fleet Contras, MD  [facility] Cardiologist:  None  [facility] Electrophysiologist:  Lanier Prude, MD    Interval History:    Amanda Stewart is a 80 y.o. female who presents for a follow up visit after dual-chamber permanent pacemaker implant on August 08, 2021.  She has done well since implant.  The incision is healed well.       Past Medical History:  Diagnosis Date   Anemia    Arrhythmia    CAD (coronary artery disease)    Chest pain    Combined systolic and diastolic heart failure (HCC)    Diabetes mellitus without complication (HCC)    GERD (gastroesophageal reflux disease)    Hyperlipidemia    Hypertension    Left bundle branch block    Myocardial infarction [facility])     Past Surgical History:  Procedure Laterality Date   ABDOMINAL HYSTERECTOMY     LEFT AND RIGHT HEART CATHETERIZATION WITH CORONARY ANGIOGRAM N/A 05/26/2014   Procedure: LEFT AND RIGHT HEART CATHETERIZATION WITH CORONARY ANGIOGRAM;  Surgeon: [name], MD;  Location: [facility] CATH LAB;  Service: Cardiovascular;  Laterality: N/A;   PACEMAKER IMPLANT N/A 08/08/2021   Procedure: PACEMAKER IMPLANT;  Surgeon: Lanier Prude, MD;  Location: [facility] INVASIVE CV LAB;  Service: Cardiovascular;  Laterality: N/A;   TEMPORARY PACEMAKER N/A 08/07/2021   Procedure: TEMPORARY PACEMAKER;  Surgeon: Rinaldo Cloud, MD;  Location: [facility] INVASIVE CV LAB;  Service: Cardiovascular;  Laterality: N/A;    Current Medications: Current Meds  Medication Sig   acetaminophen (TYLENOL) 325 MG tablet Take 1-2 tablets (325-650 mg total) by mouth every 4 (four) hours as needed for mild pain.   albuterol (VENTOLIN HFA) 108 (90 Base) MCG/ACT inhaler Inhale 2 puffs into the lungs every 6 (six) hours as needed for wheezing or shortness of breath.   amLODipine (NORVASC) 10 MG tablet Take 10 mg by mouth daily.    aspirin EC 81 MG tablet Take 81 mg by mouth daily.   atorvastatin (LIPITOR) 40 MG tablet Take 40 mg by mouth daily.   carvedilol (COREG) 12.5 MG tablet Take 12.5 mg by mouth 2 (two) times daily with a meal.   cilostazol (PLETAL) 100 MG tablet Take 100 mg by mouth daily.   dorzolamide (TRUSOPT) 2 % ophthalmic solution Place 1 drop into both eyes 2 (two) times daily.   glimepiride (AMARYL) 2 MG tablet Take 2 mg by mouth daily.   hydrALAZINE (APRESOLINE) 50 MG tablet Take 50 mg by mouth 2 (two) times daily.   insulin glargine (LANTUS) 100 UNIT/ML Solostar Pen Inject 5 Units into the skin at bedtime.   JARDIANCE 10 MG TABS tablet Take 10 mg by mouth every morning.   losartan (COZAAR) 100 MG tablet Take 1 tablet (100 mg total) by mouth daily.   Multiple Vitamin (MULTIVITAMIN WITH MINERALS) TABS tablet Take 1 tablet by mouth daily.   nitroGLYCERIN (NITROSTAT) 0.4 MG SL tablet Place 1 tablet (0.4 mg total) under the tongue every 5 (five) minutes x 3 doses as needed for chest pain.   pantoprazole (PROTONIX) 40 MG tablet Take 1 tablet (40 mg total) by mouth daily at 6 (six) AM.     Allergies:   Metformin and related   Social History   Socioeconomic History   Marital status: Married    Spouse name:  Not on file   Number of children: Not on file   Years of education: Not on file   Highest education level: Not on file  Occupational History   Not on file  Tobacco Use   Smoking status: Never   Smokeless tobacco: Never  Vaping Use   Vaping Use: Never used  Substance and Sexual Activity   Alcohol use: Yes    Alcohol/week: 1.0 standard drink    Types: 1 Glasses of wine per week   Drug use: No   Sexual activity: Not on file  Other Topics Concern   Not on file  Social History Narrative   Not on file   Social Determinants of Health   Financial Resource Strain: Not on file  Food Insecurity: Not on file  Transportation Needs: Not on file  Physical Activity: Not on file  Stress: Not on  file  Social Connections: Not on file     Family History: The patient's family history is not on file.  ROS:   Please see the history of present illness.    All other systems reviewed and are negative.  EKGs/Labs/Other Studies Reviewed:    The following studies were reviewed today:  November 23, 2021 in clinic device interrogation personally reviewed/performed Lead parameter stable Reprogrammed atrial lead outputs Over 11,000 mode switch episodes consistent with far field oversensing on the atrial lead.  Today I adjusted the post ventricular atrial blanking which corrected this issue.  EKG:  The ekg ordered today demonstrates poor atrial tracking.  Ventricular pacing.  Recent Labs: 08/07/2021: ALT 68; B Natriuretic Peptide 571.2; BUN 20; Creatinine, Ser 1.16; Hemoglobin 10.7; Magnesium 1.9; Platelets 164; Potassium 4.3; Sodium 142; TSH 0.889  Recent Lipid Panel    Component Value Date/Time   CHOL 131 08/07/2021 0634   TRIG 60 08/07/2021 0634   HDL 51 08/07/2021 0634   CHOLHDL 2.6 08/07/2021 0634   VLDL 12 08/07/2021 0634   LDLCALC 68 08/07/2021 0634   LDLCALC 160 (H) 01/05/2021 0948    Physical Exam:    VS:  BP 120/62    Pulse 70    Ht 5\' 5"  (1.651 m)    Wt 178 lb 9.6 oz (81 kg)    SpO2 98%    BMI 29.72 kg/m     Wt Readings from Last 3 Encounters:  11/23/21 178 lb 9.6 oz (81 kg)  08/09/21 178 lb 5.6 oz (80.9 kg)  11/05/15 171 lb (77.6 kg)     GEN:  Well nourished, well developed in no acute distress HEENT: Normal NECK: No JVD; No carotid bruits LYMPHATICS: No lymphadenopathy CARDIAC: RRR, no murmurs, rubs, gallops.  Pacemaker pocket well-healed RESPIRATORY:  Clear to auscultation without rales, wheezing or rhonchi  ABDOMEN: Soft, non-tender, non-distended MUSCULOSKELETAL:  No edema; No deformity  SKIN: Warm and dry NEUROLOGIC:  Alert and oriented x 3 PSYCHIATRIC:  Normal affect        ASSESSMENT:    1. Complete heart block (HCC)   2. Pacemaker     PLAN:    In order of problems listed above:  #Complete heart block #Permanent pacemaker in situ  Device functioning appropriately after I reprogrammed the post ventricular atrial blanking period to exclude a far field R waves.  Lead parameters are stable.  Continue remote monitoring.  Follow-up 9 months or sooner as needed.   Medication Adjustments/Labs and Tests Ordered: Current medicines are reviewed at length with the patient today.  Concerns regarding medicines are outlined above.  Orders Placed  This Encounter  Procedures   EKG 12-Lead   No orders of the defined types were placed in this encounter.    Signed, Steffanie Dunn, MD, [facility], [facility] 11/23/2021 9:31 PM    Electrophysiology [facility]

## 2021-11-23 NOTE — Patient Instructions (Addendum)
Medication Instructions:  Your physician recommends that you continue on your current medications as directed. Please refer to the Current Medication list given to you today. *If you need a refill on your cardiac medications before your next appointment, please call your pharmacy*  Lab Work: None ordered. If you have labs (blood work) drawn today and your tests are completely normal, you will receive your results only by: MyChart Message (if you have MyChart) OR A paper copy in the mail If you have any lab test that is abnormal or we need to change your treatment, we will call you to review the results.  Testing/Procedures: None ordered.  Follow-Up: At [facility], you and your health needs are our priority.  As part of our continuing mission to provide you with exceptional heart care, we have created designated Provider Care Teams.  These Care Teams include your primary Cardiologist (physician) and Advanced Practice Providers (APPs -  Physician Assistants and Nurse Practitioners) who all work together to provide you with the care you need, when you need it.  Your next appointment:   Your physician wants you to follow-up in: 9 months with Lanier Prude, MD. Bonita Quin will receive a reminder letter in the mail two months in advance. If you don't receive a letter, please call our office to schedule the follow-up appointment.  Remote monitoring is used to monitor your Pacemaker from home. This monitoring reduces the number of office visits required to check your device to one time per year. It allows [place] to keep an eye on the functioning of your device to ensure it is working properly. You are scheduled for a device check from home on 02/06/2022. You may send your transmission at any time that day. If you have a wireless device, the transmission will be sent automatically. After your physician reviews your transmission, you will receive a postcard with your next transmission date.

## 2021-12-09 ENCOUNTER — Other Ambulatory Visit: Payer: Self-pay | Admitting: Internal Medicine

## 2021-12-10 LAB — LIPID PANEL
Cholesterol: 165 mg/dL (ref <200)
HDL: 60 mg/dL (ref >=50)
LDL Cholesterol (Calc): 87 mg/dL (calc)
Non-HDL Cholesterol (Calc): 105 mg/dL (calc) (ref <130)
Total CHOL/HDL Ratio: 2.8 (calc) (ref <5.0)
Triglycerides: 85 mg/dL (ref <150)

## 2021-12-10 LAB — CBC
HCT: 36.2 % (ref 35.0–45.0)
Hemoglobin: 11.7 g/dL (ref 11.7–15.5)
MCH: 30.6 pg (ref 27.0–33.0)
MCHC: 32.3 g/dL (ref 32.0–36.0)
MCV: 94.8 fL (ref 80.0–100.0)
MPV: 11 fL (ref 7.5–12.5)
Platelets: 170 10*3/uL (ref 140–400)
RBC: 3.82 10*6/uL (ref 3.80–5.10)
RDW: 14.5 % (ref 11.0–15.0)
WBC: 6 10*3/uL (ref 3.8–10.8)

## 2021-12-10 LAB — COMPLETE METABOLIC PANEL WITH GFR
AG Ratio: 1.5 (calc) (ref 1.0–2.5)
ALT: 11 U/L (ref 6–29)
AST: 14 U/L (ref 10–35)
Albumin: 4.4 g/dL (ref 3.6–5.1)
Alkaline phosphatase (APISO): 116 U/L (ref 37–153)
BUN/Creatinine Ratio: 17 (calc) (ref 6–22)
BUN: 22 mg/dL (ref 7–25)
CO2: 24 mmol/L (ref 20–32)
Calcium: 9.7 mg/dL (ref 8.6–10.4)
Chloride: 107 mmol/L (ref 98–110)
Creat: 1.31 mg/dL — ABNORMAL HIGH (ref 0.60–0.95)
Globulin: 2.9 g/dL (calc) (ref 1.9–3.7)
Glucose, Bld: 231 mg/dL — ABNORMAL HIGH (ref 65–99)
Potassium: 3.9 mmol/L (ref 3.5–5.3)
Sodium: 145 mmol/L (ref 135–146)
Total Bilirubin: 0.6 mg/dL (ref 0.2–1.2)
Total Protein: 7.3 g/dL (ref 6.1–8.1)
eGFR: 41 mL/min/{1.73_m2} — ABNORMAL LOW (ref >=60)

## 2021-12-10 LAB — FOLATE: Folate: >24 ng/mL

## 2021-12-10 LAB — TSH: TSH: 0.95 mIU/L (ref 0.40–4.50)

## 2021-12-10 LAB — VITAMIN B12: Vitamin B-12: 605 pg/mL (ref 200–1100)

## 2022-02-06 ENCOUNTER — Ambulatory Visit (INDEPENDENT_AMBULATORY_CARE_PROVIDER_SITE_OTHER): Payer: Medicare Other

## 2022-02-06 DIAGNOSIS — I442 Atrioventricular block, complete: Secondary | ICD-10-CM

## 2022-02-07 LAB — CUP PACEART REMOTE DEVICE CHECK
Battery Remaining Longevity: 117 mo
Battery Remaining Percentage: 95.5 %
Battery Voltage: 3.02 V
Brady Statistic AP VP Percent: 1 %
Brady Statistic AP VS Percent: 1 %
Brady Statistic AS VP Percent: 99 %
Brady Statistic AS VS Percent: 1 %
Brady Statistic RA Percent Paced: 1 %
Brady Statistic RV Percent Paced: 99 %
Date Time Interrogation Session: 20230313030012
Implantable Lead Implant Date: 20220912
Implantable Lead Implant Date: 20220912
Implantable Lead Location: 753859
Implantable Lead Location: 753860
Implantable Pulse Generator Implant Date: 20220912
Lead Channel Impedance Value: 390 Ohm
Lead Channel Impedance Value: 550 Ohm
Lead Channel Pacing Threshold Amplitude: 0.5 V
Lead Channel Pacing Threshold Amplitude: 0.625 V
Lead Channel Pacing Threshold Pulse Width: 0.5 ms
Lead Channel Pacing Threshold Pulse Width: 0.5 ms
Lead Channel Sensing Intrinsic Amplitude: 12 mV
Lead Channel Sensing Intrinsic Amplitude: 3.3 mV
Lead Channel Setting Pacing Amplitude: 0.875
Lead Channel Setting Pacing Amplitude: 2 V
Lead Channel Setting Pacing Pulse Width: 0.5 ms
Lead Channel Setting Sensing Sensitivity: 2 mV
Pulse Gen Model: 2272
Pulse Gen Serial Number: 3956661

## 2022-02-20 NOTE — Progress Notes (Signed)
Remote pacemaker transmission.   

## 2022-05-08 ENCOUNTER — Ambulatory Visit (INDEPENDENT_AMBULATORY_CARE_PROVIDER_SITE_OTHER): Payer: Medicare Other

## 2022-05-08 DIAGNOSIS — I442 Atrioventricular block, complete: Secondary | ICD-10-CM

## 2022-05-10 LAB — CUP PACEART REMOTE DEVICE CHECK
Battery Remaining Longevity: 115 mo
Battery Remaining Percentage: 95 %
Battery Voltage: 3.01 V
Brady Statistic AP VP Percent: 1 %
Brady Statistic AP VS Percent: 1 %
Brady Statistic AS VP Percent: 99 %
Brady Statistic AS VS Percent: 1 %
Brady Statistic RA Percent Paced: 1 %
Brady Statistic RV Percent Paced: 99 %
Date Time Interrogation Session: 20230612020012
Implantable Lead Implant Date: 20220912
Implantable Lead Implant Date: 20220912
Implantable Lead Location: 753859
Implantable Lead Location: 753860
Implantable Pulse Generator Implant Date: 20220912
Lead Channel Impedance Value: 380 Ohm
Lead Channel Impedance Value: 540 Ohm
Lead Channel Pacing Threshold Amplitude: 0.5 V
Lead Channel Pacing Threshold Amplitude: 0.625 V
Lead Channel Pacing Threshold Pulse Width: 0.5 ms
Lead Channel Pacing Threshold Pulse Width: 0.5 ms
Lead Channel Sensing Intrinsic Amplitude: 12 mV
Lead Channel Sensing Intrinsic Amplitude: 4 mV
Lead Channel Setting Pacing Amplitude: 0.875
Lead Channel Setting Pacing Amplitude: 2 V
Lead Channel Setting Pacing Pulse Width: 0.5 ms
Lead Channel Setting Sensing Sensitivity: 2 mV
Pulse Gen Model: 2272
Pulse Gen Serial Number: 3956661

## 2022-05-24 NOTE — Progress Notes (Signed)
Remote pacemaker transmission.   

## 2022-06-21 ENCOUNTER — Telehealth: Payer: Self-pay | Admitting: Cardiology

## 2022-06-21 DIAGNOSIS — I2510 Atherosclerotic heart disease of native coronary artery without angina pectoris: Secondary | ICD-10-CM

## 2022-06-21 DIAGNOSIS — I739 Peripheral vascular disease, unspecified: Secondary | ICD-10-CM

## 2022-06-21 NOTE — Telephone Encounter (Signed)
Patient and daughter called wanting to see if Dr. Lalla Brothers could send in a referral for them to get a regular cardiologist along with stay with Dr. Lalla Brothers. Please call to verify.

## 2022-06-21 NOTE — Telephone Encounter (Signed)
Notified of referral that was placed.  Verbalized understanding.

## 2022-07-26 ENCOUNTER — Telehealth: Payer: Self-pay | Admitting: Cardiology

## 2022-07-26 NOTE — Addendum Note (Signed)
Addended by: Daleen Bo I on: 07/26/2022 05:02 PM   Modules accepted: Orders

## 2022-07-26 NOTE — Telephone Encounter (Signed)
Patient's daughter states she is returning a call, but she is unsure of who called or what it may have been regarding. Please advise.

## 2022-08-01 NOTE — Telephone Encounter (Signed)
No call needed, appt made for referral

## 2022-08-03 ENCOUNTER — Ambulatory Visit: Payer: Medicare Other | Admitting: Cardiovascular Disease

## 2022-08-07 ENCOUNTER — Ambulatory Visit (INDEPENDENT_AMBULATORY_CARE_PROVIDER_SITE_OTHER): Payer: Medicare Other

## 2022-08-07 DIAGNOSIS — I442 Atrioventricular block, complete: Secondary | ICD-10-CM

## 2022-08-09 LAB — CUP PACEART REMOTE DEVICE CHECK
Battery Remaining Longevity: 112 mo
Battery Remaining Percentage: 93 %
Battery Voltage: 3.01 V
Brady Statistic AP VP Percent: 1 %
Brady Statistic AP VS Percent: 1 %
Brady Statistic AS VP Percent: 99 %
Brady Statistic AS VS Percent: 1 %
Brady Statistic RA Percent Paced: 1 %
Brady Statistic RV Percent Paced: 99 %
Date Time Interrogation Session: 20230911020013
Implantable Lead Implant Date: 20220912
Implantable Lead Implant Date: 20220912
Implantable Lead Location: 753859
Implantable Lead Location: 753860
Implantable Pulse Generator Implant Date: 20220912
Lead Channel Impedance Value: 390 Ohm
Lead Channel Impedance Value: 550 Ohm
Lead Channel Pacing Threshold Amplitude: 0.5 V
Lead Channel Pacing Threshold Amplitude: 0.625 V
Lead Channel Pacing Threshold Pulse Width: 0.5 ms
Lead Channel Pacing Threshold Pulse Width: 0.5 ms
Lead Channel Sensing Intrinsic Amplitude: 12 mV
Lead Channel Sensing Intrinsic Amplitude: 4.6 mV
Lead Channel Setting Pacing Amplitude: 0.875
Lead Channel Setting Pacing Amplitude: 2 V
Lead Channel Setting Pacing Pulse Width: 0.5 ms
Lead Channel Setting Sensing Sensitivity: 2 mV
Pulse Gen Model: 2272
Pulse Gen Serial Number: 3956661

## 2022-08-24 NOTE — Progress Notes (Signed)
Remote pacemaker transmission.   

## 2022-08-29 ENCOUNTER — Ambulatory Visit: Payer: Medicare Other | Admitting: Cardiology

## 2022-08-29 NOTE — Progress Notes (Deleted)
Electrophysiology Office Follow up Visit Note:    Date:  08/29/2022   ID:  Amanda Stewart, DOB 1941/05/13, MRN [id number]  PCP:  Fleet Contras, MD  [facility] Cardiologist:  None  [facility] Electrophysiologist:  Lanier Prude, MD    Interval History:    Amanda Stewart is a 81 y.o. female who presents for a follow up visit. They were last seen in clinic November 23, 2021 after dual-chamber permanent pacemaker was implanted on August 08, 2021.  She has done well with her permanent pacemaker.       Past Medical History:  Diagnosis Date   Anemia    Arrhythmia    CAD (coronary artery disease)    Chest pain    Combined systolic and diastolic heart failure (HCC)    Diabetes mellitus without complication (HCC)    GERD (gastroesophageal reflux disease)    Hyperlipidemia    Hypertension    Left bundle branch block    Myocardial infarction [facility])     Past Surgical History:  Procedure Laterality Date   ABDOMINAL HYSTERECTOMY     LEFT AND RIGHT HEART CATHETERIZATION WITH CORONARY ANGIOGRAM N/A 05/26/2014   Procedure: LEFT AND RIGHT HEART CATHETERIZATION WITH CORONARY ANGIOGRAM;  Surgeon: [name], MD;  Location: [facility] CATH LAB;  Service: Cardiovascular;  Laterality: N/A;   PACEMAKER IMPLANT N/A 08/08/2021   Procedure: PACEMAKER IMPLANT;  Surgeon: Lanier Prude, MD;  Location: [facility] INVASIVE CV LAB;  Service: Cardiovascular;  Laterality: N/A;   TEMPORARY PACEMAKER N/A 08/07/2021   Procedure: TEMPORARY PACEMAKER;  Surgeon: Rinaldo Cloud, MD;  Location: [facility] INVASIVE CV LAB;  Service: Cardiovascular;  Laterality: N/A;    Current Medications: No outpatient medications have been marked as taking for the 08/30/22 encounter (Appointment) with Lanier Prude, MD.     Allergies:   Metformin and related   Social History   Socioeconomic History   Marital status: Married    Spouse name: Not on file   Number of children: Not on file   Years of education: Not on file    Highest education level: Not on file  Occupational History   Not on file  Tobacco Use   Smoking status: Never   Smokeless tobacco: Never  Vaping Use   Vaping Use: Never used  Substance and Sexual Activity   Alcohol use: Yes    Alcohol/week: 1.0 standard drink of alcohol    Types: 1 Glasses of wine per week   Drug use: No   Sexual activity: Not on file  Other Topics Concern   Not on file  Social History Narrative   Not on file   Social Determinants of Health   Financial Resource Strain: Not on file  Food Insecurity: Not on file  Transportation Needs: Not on file  Physical Activity: Not on file  Stress: Not on file  Social Connections: Not on file     Family History: The patient's family history is not on file.  ROS:   Please see the history of present illness.    All other systems reviewed and are negative.  EKGs/Labs/Other Studies Reviewed:    The following studies were reviewed today:  August 30, 2022 in clinic device interrogation personally reviewed ***    Recent Labs: 12/09/2021: ALT 11; BUN 22; Creat 1.31; Hemoglobin 11.7; Platelets 170; Potassium 3.9; Sodium 145; TSH 0.95  Recent Lipid Panel    Component Value Date/Time   CHOL 165 12/09/2021 0000   TRIG 85 12/09/2021 0000  HDL 60 12/09/2021 0000   CHOLHDL 2.8 12/09/2021 0000   VLDL 12 08/07/2021 0634   LDLCALC 87 12/09/2021 0000    Physical Exam:    VS:  There were no vitals taken for this visit.    Wt Readings from Last 3 Encounters:  11/23/21 178 lb 9.6 oz (81 kg)  08/09/21 178 lb 5.6 oz (80.9 kg)  11/05/15 171 lb (77.6 kg)     GEN: *** Well nourished, well developed in no acute distress HEENT: Normal NECK: No JVD; No carotid bruits LYMPHATICS: No lymphadenopathy CARDIAC: ***RRR, no murmurs, rubs, gallops.  Pacemaker pocket well-healed. RESPIRATORY:  Clear to auscultation without rales, wheezing or rhonchi  ABDOMEN: Soft, non-tender, non-distended MUSCULOSKELETAL:  No edema; No  deformity  SKIN: Warm and dry NEUROLOGIC:  Alert and oriented x 3 PSYCHIATRIC:  Normal affect        ASSESSMENT:    1. Complete heart block ([place])   2. Pacemaker    PLAN:    In order of problems listed above:  #Complete heart block #Permanent pacemaker in situ Device functioning appropriately.  Continue remote monitoring.  Follow-up 1 year or sooner as needed.  APP appointment.    Total time spent with patient today *** minutes. This includes reviewing records, evaluating the patient and coordinating care.   Medication Adjustments/Labs and Tests Ordered: Current medicines are reviewed at length with the patient today.  Concerns regarding medicines are outlined above.  No orders of the defined types were placed in this encounter.  No orders of the defined types were placed in this encounter.    Signed, Lars Mage, MD, [facility], [facility] 08/29/2022 3:57 PM    Electrophysiology [facility]

## 2022-08-30 ENCOUNTER — Ambulatory Visit: Payer: Medicare Other | Admitting: Cardiology

## 2022-08-30 DIAGNOSIS — I442 Atrioventricular block, complete: Secondary | ICD-10-CM

## 2022-08-30 DIAGNOSIS — Z950 Presence of cardiac pacemaker: Secondary | ICD-10-CM

## 2022-09-11 ENCOUNTER — Encounter: Payer: Self-pay | Admitting: Cardiology

## 2022-09-11 ENCOUNTER — Ambulatory Visit: Payer: Medicare Other | Attending: Cardiology | Admitting: Cardiology

## 2022-09-11 VITALS — BP 130/60 | HR 84 | Ht 65.0 in | Wt 173.0 lb

## 2022-09-11 DIAGNOSIS — I10 Essential (primary) hypertension: Secondary | ICD-10-CM

## 2022-09-11 DIAGNOSIS — I2510 Atherosclerotic heart disease of native coronary artery without angina pectoris: Secondary | ICD-10-CM

## 2022-09-11 DIAGNOSIS — I442 Atrioventricular block, complete: Secondary | ICD-10-CM | POA: Diagnosis not present

## 2022-09-11 DIAGNOSIS — E119 Type 2 diabetes mellitus without complications: Secondary | ICD-10-CM

## 2022-09-11 DIAGNOSIS — I739 Peripheral vascular disease, unspecified: Secondary | ICD-10-CM | POA: Diagnosis not present

## 2022-09-11 DIAGNOSIS — N1831 Chronic kidney disease, stage 3a: Secondary | ICD-10-CM

## 2022-09-11 NOTE — Progress Notes (Signed)
Cardiology Office Note:    Date:  09/11/2022   ID:  Amanda Stewart, DOB 1941-06-30, MRN [id number]  PCP:  Fleet Contras, MD   [facility] Providers Cardiologist:  Donato Schultz, MD Electrophysiologist:  Lanier Prude, MD     Referring MD: Fleet Contras, MD   History of Present Illness:    Amanda Stewart is a 81 y.o. female here for the evaluation of coronary artery disease, and hypertension, at the request of Dr. Fleet Contras (PCP). 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  Referral notes personally reviewed. She was seen by Dr. Concepcion Elk 08/25/22. She noted improvement in her tingling and burning leg pains with Lyrica and occasional Tylenol. Her blood pressure was well controlled 120/79 on hydralazine, amlodipine. It was noted her A1c was not at goal; she admitted to not taking Jardiance daily.  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  12/09/2021: ALT 11; BUN 22; Creat 1.31; Hemoglobin 11.7; Platelets 170; Potassium 3.9; Sodium 145; TSH 0.95, LDL 87.   She saw Dr. Lalla Brothers 11/23/2021 where she was doing well since her dual-chamber permanent pacemaker implant on 08/08/2021. On 06/21/2022 Ms. Pecola Leisure and her daughter called the office for a referral to a regular cardiologist.  Today: She is accompanied by her daughter. Overall she appears well. However, she does complain of terrible burning and pain in her legs. This has been ongoing for at least 1 year.  She confirms having no issues with bleeding, chest pain, or syncope. No smoking history.  We reviewed her prior testing results at length.  She denies any palpitations, shortness of breath, or peripheral edema. No lightheadedness, headaches,  orthopnea, or PND.  Extensive review of prior medical records including cardiac catheterization lab work discharge summaries.   Past Medical History:  Diagnosis Date   Allergic rhinitis    Anemia    Arrhythmia    CAD (coronary artery disease)    Chest pain    Combined systolic and diastolic heart failure (HCC)    Diabetes mellitus without complication (HCC)    Diabetic peripheral neuropathy (HCC)    GERD (gastroesophageal reflux disease)    Heart block    Hyperlipidemia    Hypertension    Left bundle branch block    Low back pain    Myocardial infarction (HCC)    Peripheral arterial disease (HCC)    Retinopathy     Past Surgical History:  Procedure Laterality Date   ABDOMINAL HYSTERECTOMY     LEFT AND RIGHT HEART CATHETERIZATION WITH CORONARY ANGIOGRAM N/A 05/26/2014   Procedure: LEFT AND RIGHT HEART CATHETERIZATION WITH CORONARY ANGIOGRAM;  Surgeon: [name], MD;  Location: [facility] CATH LAB;  Service: Cardiovascular;  Laterality: N/A;   PACEMAKER IMPLANT N/A 08/08/2021   Procedure: PACEMAKER IMPLANT;  Surgeon: Lanier Prude, MD;  Location: [facility] INVASIVE CV LAB;  Service: Cardiovascular;  Laterality: N/A;   TEMPORARY PACEMAKER N/A 08/07/2021   Procedure: TEMPORARY PACEMAKER;  Surgeon: Rinaldo Cloud, MD;  Location: [facility] INVASIVE CV LAB;  Service: Cardiovascular;  Laterality: N/A;    Current Medications: Current Meds  Medication Sig   acetaminophen (TYLENOL) 325 MG tablet Take 1-2 tablets (325-650 mg total) by mouth every 4 (four) hours as needed  for mild pain.   albuterol (VENTOLIN HFA) 108 (90 Base) MCG/ACT inhaler Inhale 2 puffs into the lungs every 6 (six) hours as needed for wheezing or shortness of breath.   amLODipine (NORVASC) 10 MG tablet Take 10 mg by mouth daily.   aspirin EC 81 MG tablet Take 81 mg by mouth daily.   atorvastatin (LIPITOR) 40 MG tablet Take 40 mg by mouth daily.   carvedilol (COREG) 12.5 MG tablet Take 12.5 mg by mouth 2 (two) times daily with a  meal.   cilostazol (PLETAL) 100 MG tablet Take 100 mg by mouth daily.   dorzolamide (TRUSOPT) 2 % ophthalmic solution Place 1 drop into both eyes 2 (two) times daily.   glimepiride (AMARYL) 2 MG tablet Take 2 mg by mouth daily.   hydrALAZINE (APRESOLINE) 50 MG tablet Take 50 mg by mouth 2 (two) times daily.   insulin glargine (LANTUS) 100 UNIT/ML Solostar Pen Inject 5 Units into the skin at bedtime.   JARDIANCE 10 MG TABS tablet Take 10 mg by mouth every morning.   losartan (COZAAR) 100 MG tablet Take 1 tablet (100 mg total) by mouth daily.   Multiple Vitamin (MULTIVITAMIN WITH MINERALS) TABS tablet Take 1 tablet by mouth daily.   nitroGLYCERIN (NITROSTAT) 0.4 MG SL tablet Place 1 tablet (0.4 mg total) under the tongue every 5 (five) minutes x 3 doses as needed for chest pain.   pantoprazole (PROTONIX) 40 MG tablet Take 1 tablet (40 mg total) by mouth daily at 6 (six) AM.     Allergies:   Metformin and related   Social History   Socioeconomic History   Marital status: Married    Spouse name: Not on file   Number of children: Not on file   Years of education: Not on file   Highest education level: Not on file  Occupational History   Not on file  Tobacco Use   Smoking status: Never   Smokeless tobacco: Never  Vaping Use   Vaping Use: Never used  Substance and Sexual Activity   Alcohol use: Yes    Alcohol/week: 1.0 standard drink of alcohol    Types: 1 Glasses of wine per week   Drug use: No   Sexual activity: Not on file  Other Topics Concern   Not on file  Social History Narrative   Not on file   Social Determinants of Health   Financial Resource Strain: Not on file  Food Insecurity: Not on file  Transportation Needs: Not on file  Physical Activity: Not on file  Stress: Not on file  Social Connections: Not on file     Family History: The patient's family history includes Diabetes in her father and mother; Stroke in her father.  ROS:   Please see the history of  present illness.    (+) Burning sensations and pain in bilateral LE All other systems reviewed and are negative.  EKGs/Labs/Other Studies Reviewed:    The following studies were reviewed today:  Pacemaker Implant  08/08/2021:  CONCLUSIONS:   1. Complete heart block  2. Successful dual chamber permanent pacemaker implantation  3.  No early apparent complications.   Echo  08/07/2021: Sonographer Comments: Unable to turn due to temporary pacemaker.  IMPRESSIONS    1. Left ventricular ejection fraction, by estimation, is 70 to 75%. The  left ventricle has hyperdynamic function. The left ventricle has no  regional wall motion abnormalities. There is mild concentric left  ventricular hypertrophy. Left ventricular  diastolic parameters  are consistent with Grade II diastolic dysfunction  (pseudonormalization).   2. Right ventricular systolic function is normal. The right ventricular  size is normal. There is moderately elevated pulmonary artery systolic  pressure.   3. Left atrial size was mild to moderately dilated.   4. Right atrial size was mildly dilated.   5. A small pericardial effusion is present. The pericardial effusion is  circumferential. There is no evidence of cardiac tamponade.   6. The mitral valve is degenerative. Mild to moderate mitral valve  regurgitation. Moderate mitral annular calcification.   7. Tricuspid valve regurgitation is moderate.   8. The aortic valve is tricuspid. There is moderate calcification of the  aortic valve. There is moderate thickening of the aortic valve. Aortic  valve regurgitation is trivial. Moderate aortic valve stenosis.   9. There is Moderate (Grade III) atheroma plaque involving the aortic  root and ascending aorta.  10. The inferior vena cava is normal in size with <50% respiratory  variability, suggesting right atrial pressure of 8 mmHg.   ABI Doppler  06/02/2021: Summary:  Right: Resting right ankle-brachial index indicates moderate  right lower  extremity arterial disease.  The right toe-brachial index is abnormal.   Left: Resting left ankle-brachial index indicates mild left lower  extremity arterial disease.  The left toe-brachial index is abnormal.   Nuclear Stress Test 12/12/2014: IMPRESSION: 1. Non reversible decreased myocardial perfusion involving inferolateral wall of the LEFT ventricle new since previous exam.   2. Global hypokinesia of the LEFT ventricle.   3. Left ventricular ejection fraction 26   4. Low -risk stress test findings.  Heart Catheterization 05/26/2014: Impressions: Normal left main coronary artery. Normal left anterior descending artery and its branches. 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 Normal right coronary artery. Normal left ventricular systolic function.  LVEDP 17 mmHg.  Ej. fraction  45 %. Mild to moderately elevated PA and wedge pressures.   EKG:  EKG is personally reviewed and interpreted. 09/11/2022: Ventricular paced rhythm.  Recent Labs: 12/09/2021: ALT 11; BUN 22; Creat 1.31; Hemoglobin 11.7; Platelets 170; Potassium 3.9; Sodium 145; TSH 0.95 LDL 87  Recent Lipid Panel    Component Value Date/Time   CHOL 165 12/09/2021 0000   TRIG 85 12/09/2021 0000   HDL 60 12/09/2021 0000   CHOLHDL 2.8 12/09/2021 0000   VLDL 12 08/07/2021 0634   LDLCALC 87 12/09/2021 0000     Risk Assessment/Calculations:          Physical Exam:    VS:  BP 130/60 (BP Location: Left Arm, Patient Position: Sitting, Cuff Size: Normal)   Pulse 84   Ht 5\' 5"  (1.651 m)   Wt 173 lb (78.5 kg)   SpO2 96%   BMI 28.79 kg/m     Wt Readings from Last 3 Encounters:  09/11/22 173 lb (78.5 kg)  11/23/21 178 lb 9.6 oz (81 kg)  08/09/21 178 lb 5.6 oz (80.9 kg)     GEN: Well nourished, well developed in no acute distress HEENT: Normal NECK: No JVD; No carotid bruits LYMPHATICS: No lymphadenopathy CARDIAC: RRR, no murmurs, rubs, gallops RESPIRATORY:   Clear to auscultation without rales, wheezing or rhonchi  ABDOMEN: Soft, non-tender, non-distended MUSCULOSKELETAL:  No edema; No deformity  SKIN: Warm and dry NEUROLOGIC:  Alert and oriented x 3 PSYCHIATRIC:  Normal affect   ASSESSMENT:    1. Coronary artery disease involving native coronary artery of native heart without angina pectoris   2. Complete  heart block (HCC)   3. PAD (peripheral artery disease) (HCC)   4. Stage 3a chronic kidney disease (HCC)   5. Diabetes mellitus with coincident hypertension (HCC)    PLAN:    In order of problems listed above:  Coronary artery disease - 99% AV groove circumflex 1.5 mm vessel too small for stenting noted on cardiac catheterization in 2015 by Dr. Algie Coffer.  Results reviewed. -Continue with goal-directed medical therapy which includes aspirin statin blood pressure control as well as diabetes control.  Diabetes with hypertension - Managed by Dr. Concepcion Elk.  Last hemoglobin A1c elevated at 9.3 on 08/07/2021.  On Jardiance excellent for overall cardiac health.  Also on glimepiride.  Peripheral arterial disease - Currently on aspirin 81 mg as well as Pletal 100 mg daily.  No bleeding.  Moderate right, mild left disease on ABI.  She does have tingling and burning in her legs occasionally, likely more neuropathic.  Hyperlipidemia - Continue with atorvastatin 40 mg a day, LDL 87 in January 2023, above goal of 70.  Chronic kidney disease stage IIIa - Creatinine 1.3 in January 2023.  Continue with optimization of diabetic control as well as blood pressure.  She is on angiotensin receptor blocker losartan 100 mg a day for renal protection.  Permanent pacemaker - Complete heart block, Dr. Lalla Brothers, ventricular pacing noted on ECG.    Follow-up: 1 year.  Medication Adjustments/Labs and Tests Ordered: Current medicines are reviewed at length with the patient today.  Concerns regarding medicines are outlined above.   Orders Placed This Encounter   Procedures   EKG 12-Lead   No orders of the defined types were placed in this encounter.  Patient Instructions  Medication Instructions:  The current medical regimen is effective;  continue present plan and medications.  *If you need a refill on your cardiac medications before your next appointment, please call your pharmacy*  Follow-Up: At [facility], you and your health needs are our priority.  As part of our continuing mission to provide you with exceptional heart care, we have created designated Provider Care Teams.  These Care Teams include your primary Cardiologist (physician) and Advanced Practice Providers (APPs -  Physician Assistants and Nurse Practitioners) who all work together to provide you with the care you need, when you need it.  We recommend signing up for the patient portal called "MyChart".  Sign up information is provided on this After Visit Summary.  MyChart is used to connect with patients for Virtual Visits (Telemedicine).  Patients are able to view lab/test results, encounter notes, upcoming appointments, etc.  Non-urgent messages can be sent to your provider as well.   To learn more about what you can do with MyChart, go to [email or web address].    Your next appointment:   1 year(s)  The format for your next appointment:   In Person  Provider:   Donato Schultz, MD     Important Information About Sugar         I,Mathew Stumpf,acting as a scribe for Donato Schultz, MD.,have documented all relevant documentation on the behalf of Donato Schultz, MD,as directed by  Donato Schultz, MD while in the presence of Donato Schultz, MD.  I, Donato Schultz, MD, have reviewed all documentation for this visit. The documentation on 09/11/22 for the exam, diagnosis, procedures, and orders are all accurate and complete.   Signed, Donato Schultz, MD  09/11/2022 9:11 AM    [facility]

## 2022-09-11 NOTE — Patient Instructions (Signed)
Medication Instructions:  The current medical regimen is effective;  continue present plan and medications.  *If you need a refill on your cardiac medications before your next appointment, please call your pharmacy*  Follow-Up: At [facility], you and your health needs are our priority.  As part of our continuing mission to provide you with exceptional heart care, we have created designated Provider Care Teams.  These Care Teams include your primary Cardiologist (physician) and Advanced Practice Providers (APPs -  Physician Assistants and Nurse Practitioners) who all work together to provide you with the care you need, when you need it.  We recommend signing up for the patient portal called "MyChart".  Sign up information is provided on this After Visit Summary.  MyChart is used to connect with patients for Virtual Visits (Telemedicine).  Patients are able to view lab/test results, encounter notes, upcoming appointments, etc.  Non-urgent messages can be sent to your provider as well.   To learn more about what you can do with MyChart, go to [email or web address].    Your next appointment:   1 year(s)  The format for your next appointment:   In Person  Provider:   Mark Skains, MD      Important Information About Sugar       

## 2022-09-14 ENCOUNTER — Encounter: Payer: Medicare Other | Admitting: Physician Assistant

## 2022-11-06 ENCOUNTER — Ambulatory Visit (INDEPENDENT_AMBULATORY_CARE_PROVIDER_SITE_OTHER): Payer: Medicare Other

## 2022-11-06 DIAGNOSIS — I442 Atrioventricular block, complete: Secondary | ICD-10-CM

## 2022-11-07 LAB — CUP PACEART REMOTE DEVICE CHECK
Battery Remaining Longevity: 110 mo
Battery Remaining Percentage: 90 %
Battery Voltage: 3.01 V
Brady Statistic AP VP Percent: 1 %
Brady Statistic AP VS Percent: 1 %
Brady Statistic AS VP Percent: 98 %
Brady Statistic AS VS Percent: 1 %
Brady Statistic RA Percent Paced: 1 %
Brady Statistic RV Percent Paced: 99 %
Date Time Interrogation Session: 20231211020019
Implantable Lead Connection Status: 753985
Implantable Lead Connection Status: 753985
Implantable Lead Implant Date: 20220912
Implantable Lead Implant Date: 20220912
Implantable Lead Location: 753859
Implantable Lead Location: 753860
Implantable Pulse Generator Implant Date: 20220912
Lead Channel Impedance Value: 400 Ohm
Lead Channel Impedance Value: 560 Ohm
Lead Channel Pacing Threshold Amplitude: 0.5 V
Lead Channel Pacing Threshold Amplitude: 0.625 V
Lead Channel Pacing Threshold Pulse Width: 0.5 ms
Lead Channel Pacing Threshold Pulse Width: 0.5 ms
Lead Channel Sensing Intrinsic Amplitude: 12 mV
Lead Channel Sensing Intrinsic Amplitude: 4.7 mV
Lead Channel Setting Pacing Amplitude: 0.875
Lead Channel Setting Pacing Amplitude: 2 V
Lead Channel Setting Pacing Pulse Width: 0.5 ms
Lead Channel Setting Sensing Sensitivity: 2 mV
Pulse Gen Model: 2272
Pulse Gen Serial Number: 3956661

## 2022-12-14 NOTE — Progress Notes (Signed)
Remote pacemaker transmission.   

## 2022-12-22 ENCOUNTER — Other Ambulatory Visit: Payer: Self-pay | Admitting: Internal Medicine

## 2022-12-23 LAB — CBC

## 2022-12-23 LAB — COMPLETE METABOLIC PANEL WITH GFR
AG Ratio: 1.5 (calc) (ref 1.0–2.5)
ALT: 12 U/L (ref 6–29)
AST: 14 U/L (ref 10–35)
Albumin: 4.3 g/dL (ref 3.6–5.1)
Alkaline phosphatase (APISO): 102 U/L (ref 37–153)
BUN/Creatinine Ratio: 18 (calc) (ref 6–22)
BUN: 22 mg/dL (ref 7–25)
CO2: 21 mmol/L (ref 20–32)
Calcium: 9.4 mg/dL (ref 8.6–10.4)
Chloride: 107 mmol/L (ref 98–110)
Creat: 1.25 mg/dL — ABNORMAL HIGH (ref 0.60–0.95)
Globulin: 2.8 g/dL (calc) (ref 1.9–3.7)
Glucose, Bld: 217 mg/dL — ABNORMAL HIGH (ref 65–99)
Potassium: 4.5 mmol/L (ref 3.5–5.3)
Sodium: 144 mmol/L (ref 135–146)
Total Bilirubin: 0.4 mg/dL (ref 0.2–1.2)
Total Protein: 7.1 g/dL (ref 6.1–8.1)
eGFR: 43 mL/min/{1.73_m2} — ABNORMAL LOW (ref >=60)

## 2022-12-23 LAB — LIPID PANEL
Cholesterol: 187 mg/dL (ref <200)
HDL: 61 mg/dL (ref >=50)
LDL Cholesterol (Calc): 106 mg/dL (calc) — ABNORMAL HIGH
Non-HDL Cholesterol (Calc): 126 mg/dL (calc) (ref <130)
Total CHOL/HDL Ratio: 3.1 (calc) (ref <5.0)
Triglycerides: 106 mg/dL (ref <150)

## 2022-12-23 LAB — TSH: TSH: 1.07 mIU/L (ref 0.40–4.50)

## 2022-12-23 LAB — VITAMIN B12: Vitamin B-12: 567 pg/mL (ref 200–1100)

## 2022-12-23 LAB — FOLATE: Folate: >24 ng/mL

## 2023-02-05 ENCOUNTER — Ambulatory Visit (INDEPENDENT_AMBULATORY_CARE_PROVIDER_SITE_OTHER): Payer: 59

## 2023-02-05 DIAGNOSIS — I442 Atrioventricular block, complete: Secondary | ICD-10-CM

## 2023-02-07 LAB — CUP PACEART REMOTE DEVICE CHECK
Battery Remaining Longevity: 106 mo
Battery Remaining Percentage: 88 %
Battery Voltage: 3.01 V
Brady Statistic AP VP Percent: 1 %
Brady Statistic AP VS Percent: 1 %
Brady Statistic AS VP Percent: 98 %
Brady Statistic AS VS Percent: 1 %
Brady Statistic RA Percent Paced: 1 %
Brady Statistic RV Percent Paced: 99 %
Date Time Interrogation Session: 20240311030013
Implantable Lead Connection Status: 753985
Implantable Lead Connection Status: 753985
Implantable Lead Implant Date: 20220912
Implantable Lead Implant Date: 20220912
Implantable Lead Location: 753859
Implantable Lead Location: 753860
Implantable Pulse Generator Implant Date: 20220912
Lead Channel Impedance Value: 360 Ohm
Lead Channel Impedance Value: 510 Ohm
Lead Channel Pacing Threshold Amplitude: 0.5 V
Lead Channel Pacing Threshold Amplitude: 0.625 V
Lead Channel Pacing Threshold Pulse Width: 0.5 ms
Lead Channel Pacing Threshold Pulse Width: 0.5 ms
Lead Channel Sensing Intrinsic Amplitude: 12 mV
Lead Channel Sensing Intrinsic Amplitude: 3.4 mV
Lead Channel Setting Pacing Amplitude: 0.875
Lead Channel Setting Pacing Amplitude: 2 V
Lead Channel Setting Pacing Pulse Width: 0.5 ms
Lead Channel Setting Sensing Sensitivity: 2 mV
Pulse Gen Model: 2272
Pulse Gen Serial Number: 3956661

## 2023-03-19 NOTE — Progress Notes (Signed)
Remote pacemaker transmission.   

## 2023-05-07 ENCOUNTER — Ambulatory Visit (INDEPENDENT_AMBULATORY_CARE_PROVIDER_SITE_OTHER): Payer: 59

## 2023-05-07 DIAGNOSIS — I442 Atrioventricular block, complete: Secondary | ICD-10-CM

## 2023-05-08 LAB — CUP PACEART REMOTE DEVICE CHECK
Battery Remaining Longevity: 103 mo
Battery Remaining Percentage: 85 %
Battery Voltage: 3.01 V
Brady Statistic AP VP Percent: 1 %
Brady Statistic AP VS Percent: 1 %
Brady Statistic AS VP Percent: 98 %
Brady Statistic AS VS Percent: 1 %
Brady Statistic RA Percent Paced: 1 %
Brady Statistic RV Percent Paced: 99 %
Date Time Interrogation Session: 20240610020020
Implantable Lead Connection Status: 753985
Implantable Lead Connection Status: 753985
Implantable Lead Implant Date: 20220912
Implantable Lead Implant Date: 20220912
Implantable Lead Location: 753859
Implantable Lead Location: 753860
Implantable Pulse Generator Implant Date: 20220912
Lead Channel Impedance Value: 380 Ohm
Lead Channel Impedance Value: 530 Ohm
Lead Channel Pacing Threshold Amplitude: 0.5 V
Lead Channel Pacing Threshold Amplitude: 0.5 V
Lead Channel Pacing Threshold Pulse Width: 0.5 ms
Lead Channel Pacing Threshold Pulse Width: 0.5 ms
Lead Channel Sensing Intrinsic Amplitude: 12 mV
Lead Channel Sensing Intrinsic Amplitude: 4.2 mV
Lead Channel Setting Pacing Amplitude: 0.75 V
Lead Channel Setting Pacing Amplitude: 2 V
Lead Channel Setting Pacing Pulse Width: 0.5 ms
Lead Channel Setting Sensing Sensitivity: 2 mV
Pulse Gen Model: 2272
Pulse Gen Serial Number: 3956661

## 2023-05-29 NOTE — Progress Notes (Signed)
Remote pacemaker transmission.   

## 2023-08-06 ENCOUNTER — Ambulatory Visit (INDEPENDENT_AMBULATORY_CARE_PROVIDER_SITE_OTHER): Payer: 59

## 2023-08-06 DIAGNOSIS — I442 Atrioventricular block, complete: Secondary | ICD-10-CM

## 2023-08-07 LAB — CUP PACEART REMOTE DEVICE CHECK
Battery Remaining Longevity: 100 mo
Battery Remaining Percentage: 83 %
Battery Voltage: 3.01 V
Brady Statistic AP VP Percent: 1 %
Brady Statistic AP VS Percent: 1 %
Brady Statistic AS VP Percent: 98 %
Brady Statistic AS VS Percent: 1 %
Brady Statistic RA Percent Paced: 1 %
Brady Statistic RV Percent Paced: 99 %
Date Time Interrogation Session: 20240909020017
Implantable Lead Connection Status: 753985
Implantable Lead Connection Status: 753985
Implantable Lead Implant Date: 20220912
Implantable Lead Implant Date: 20220912
Implantable Lead Location: 753859
Implantable Lead Location: 753860
Implantable Pulse Generator Implant Date: 20220912
Lead Channel Impedance Value: 380 Ohm
Lead Channel Impedance Value: 510 Ohm
Lead Channel Pacing Threshold Amplitude: 0.5 V
Lead Channel Pacing Threshold Amplitude: 0.5 V
Lead Channel Pacing Threshold Pulse Width: 0.5 ms
Lead Channel Pacing Threshold Pulse Width: 0.5 ms
Lead Channel Sensing Intrinsic Amplitude: 12 mV
Lead Channel Sensing Intrinsic Amplitude: 4.1 mV
Lead Channel Setting Pacing Amplitude: 0.75 V
Lead Channel Setting Pacing Amplitude: 2 V
Lead Channel Setting Pacing Pulse Width: 0.5 ms
Lead Channel Setting Sensing Sensitivity: 2 mV
Pulse Gen Model: 2272
Pulse Gen Serial Number: 3956661

## 2023-08-08 ENCOUNTER — Telehealth: Payer: Self-pay | Admitting: Cardiology

## 2023-08-08 NOTE — Telephone Encounter (Signed)
Spoke with the patient's daughter who states that the patient has been getting lightheaded when standing up recently. She states that she has fallen several times recently. Denies any injuries. Denies any syncope. Her home health nurse reported that her BP drops by 10 when she stands up. The daughter states that the patient does take her blood pressure at home but she does not keep a record. I advised to have her start keeping a record. Advised to change positions slowly. Encouraged daughter to make sure that she is drinking enough fluids and to use electrolyte supplements. She does have a pair of compression stockings which I advised the daughter to have her wear. She is seeing Dr. Anne Fu on Monday 9/16.

## 2023-08-08 NOTE — Telephone Encounter (Signed)
Patient's daughter is calling because the patient has been falling a lot. Patient's daughter stated the home nurse says her pressure drops about 10 degrees when she goes to stand up, but it is within range. Patient has been feeling lightheaded. Please advise.

## 2023-08-10 NOTE — Telephone Encounter (Signed)
Called pt daughter Sue Lush advised of MD recommendation:  Stop amlodipine and hydralazine Thanks Donato Schultz, MD   Also advised to monitor BP bring log into OV scheduled for 08/13/23.  Daughter expresses understanding.

## 2023-08-11 ENCOUNTER — Emergency Department (HOSPITAL_COMMUNITY): Payer: 59

## 2023-08-11 ENCOUNTER — Encounter (HOSPITAL_COMMUNITY): Payer: Self-pay | Admitting: Pharmacy Technician

## 2023-08-11 ENCOUNTER — Observation Stay (HOSPITAL_COMMUNITY)
Admission: EM | Admit: 2023-08-11 | Discharge: 2023-08-13 | Disposition: A | Discharge status: Home / Self Care | Payer: 59 | Attending: Internal Medicine | Admitting: Internal Medicine

## 2023-08-11 ENCOUNTER — Other Ambulatory Visit: Payer: Self-pay

## 2023-08-11 DIAGNOSIS — R2689 Other abnormalities of gait and mobility: Secondary | ICD-10-CM | POA: Diagnosis not present

## 2023-08-11 DIAGNOSIS — E119 Type 2 diabetes mellitus without complications: Secondary | ICD-10-CM

## 2023-08-11 DIAGNOSIS — Z794 Long term (current) use of insulin: Secondary | ICD-10-CM | POA: Diagnosis not present

## 2023-08-11 DIAGNOSIS — Z79899 Other long term (current) drug therapy: Secondary | ICD-10-CM | POA: Insufficient documentation

## 2023-08-11 DIAGNOSIS — M6281 Muscle weakness (generalized): Secondary | ICD-10-CM | POA: Diagnosis not present

## 2023-08-11 DIAGNOSIS — U071 COVID-19: Secondary | ICD-10-CM | POA: Diagnosis not present

## 2023-08-11 DIAGNOSIS — I739 Peripheral vascular disease, unspecified: Secondary | ICD-10-CM

## 2023-08-11 DIAGNOSIS — I5042 Chronic combined systolic (congestive) and diastolic (congestive) heart failure: Secondary | ICD-10-CM | POA: Diagnosis not present

## 2023-08-11 DIAGNOSIS — R531 Weakness: Principal | ICD-10-CM

## 2023-08-11 DIAGNOSIS — N1831 Chronic kidney disease, stage 3a: Secondary | ICD-10-CM | POA: Diagnosis present

## 2023-08-11 DIAGNOSIS — Z7982 Long term (current) use of aspirin: Secondary | ICD-10-CM | POA: Diagnosis not present

## 2023-08-11 DIAGNOSIS — I152 Hypertension secondary to endocrine disorders: Secondary | ICD-10-CM | POA: Diagnosis present

## 2023-08-11 DIAGNOSIS — I951 Orthostatic hypotension: Secondary | ICD-10-CM | POA: Diagnosis not present

## 2023-08-11 DIAGNOSIS — I2510 Atherosclerotic heart disease of native coronary artery without angina pectoris: Secondary | ICD-10-CM | POA: Diagnosis present

## 2023-08-11 DIAGNOSIS — Z8616 Personal history of COVID-19: Secondary | ICD-10-CM | POA: Diagnosis present

## 2023-08-11 DIAGNOSIS — I130 Hypertensive heart and chronic kidney disease with heart failure and stage 1 through stage 4 chronic kidney disease, or unspecified chronic kidney disease: Secondary | ICD-10-CM | POA: Insufficient documentation

## 2023-08-11 DIAGNOSIS — E1159 Type 2 diabetes mellitus with other circulatory complications: Secondary | ICD-10-CM | POA: Diagnosis present

## 2023-08-11 DIAGNOSIS — I442 Atrioventricular block, complete: Secondary | ICD-10-CM | POA: Diagnosis present

## 2023-08-11 DIAGNOSIS — I959 Hypotension, unspecified: Secondary | ICD-10-CM | POA: Diagnosis present

## 2023-08-11 DIAGNOSIS — E1122 Type 2 diabetes mellitus with diabetic chronic kidney disease: Secondary | ICD-10-CM | POA: Diagnosis not present

## 2023-08-11 DIAGNOSIS — E1169 Type 2 diabetes mellitus with other specified complication: Secondary | ICD-10-CM | POA: Diagnosis not present

## 2023-08-11 DIAGNOSIS — I350 Nonrheumatic aortic (valve) stenosis: Secondary | ICD-10-CM | POA: Diagnosis not present

## 2023-08-11 DIAGNOSIS — E785 Hyperlipidemia, unspecified: Secondary | ICD-10-CM | POA: Insufficient documentation

## 2023-08-11 DIAGNOSIS — Z23 Encounter for immunization: Secondary | ICD-10-CM | POA: Diagnosis not present

## 2023-08-11 DIAGNOSIS — R2681 Unsteadiness on feet: Secondary | ICD-10-CM | POA: Insufficient documentation

## 2023-08-11 DIAGNOSIS — R5381 Other malaise: Secondary | ICD-10-CM

## 2023-08-11 DIAGNOSIS — Z950 Presence of cardiac pacemaker: Secondary | ICD-10-CM | POA: Insufficient documentation

## 2023-08-11 DIAGNOSIS — U099 Post covid-19 condition, unspecified: Secondary | ICD-10-CM | POA: Diagnosis present

## 2023-08-11 DIAGNOSIS — Z7984 Long term (current) use of oral hypoglycemic drugs: Secondary | ICD-10-CM | POA: Insufficient documentation

## 2023-08-11 HISTORY — DX: Chronic kidney disease, stage 3a: N18.31

## 2023-08-11 LAB — BASIC METABOLIC PANEL
Anion gap: 8 (ref 5–15)
BUN: 20 mg/dL (ref 8–23)
CO2: 24 mmol/L (ref 22–32)
Calcium: 9.1 mg/dL (ref 8.9–10.3)
Chloride: 102 mmol/L (ref 98–111)
Creatinine, Ser: 1.21 mg/dL — ABNORMAL HIGH (ref 0.44–1.00)
GFR, Estimated: 44 mL/min — ABNORMAL LOW (ref >60)
Glucose, Bld: 256 mg/dL — ABNORMAL HIGH (ref 70–99)
Potassium: 4.2 mmol/L (ref 3.5–5.1)
Sodium: 134 mmol/L — ABNORMAL LOW (ref 135–145)

## 2023-08-11 LAB — CBC WITH DIFFERENTIAL/PLATELET
Abs Immature Granulocytes: 0.03 10*3/uL (ref 0.00–0.07)
Basophils Absolute: 0 10*3/uL (ref 0.0–0.1)
Basophils Relative: 1 %
Eosinophils Absolute: 0.1 10*3/uL (ref 0.0–0.5)
Eosinophils Relative: 1 %
HCT: 34.8 % — ABNORMAL LOW (ref 36.0–46.0)
Hemoglobin: 11.1 g/dL — ABNORMAL LOW (ref 12.0–15.0)
Immature Granulocytes: 1 %
Lymphocytes Relative: 27 %
Lymphs Abs: 1.5 10*3/uL (ref 0.7–4.0)
MCH: 31.6 pg (ref 26.0–34.0)
MCHC: 31.9 g/dL (ref 30.0–36.0)
MCV: 99.1 fL (ref 80.0–100.0)
Monocytes Absolute: 0.5 10*3/uL (ref 0.1–1.0)
Monocytes Relative: 10 %
Neutro Abs: 3.2 10*3/uL (ref 1.7–7.7)
Neutrophils Relative %: 60 %
Platelets: 184 10*3/uL (ref 150–400)
RBC: 3.51 MIL/uL — ABNORMAL LOW (ref 3.87–5.11)
RDW: 13.8 % (ref 11.5–15.5)
WBC: 5.3 10*3/uL (ref 4.0–10.5)
nRBC: 0 % (ref 0.0–0.2)

## 2023-08-11 LAB — URINALYSIS, ROUTINE W REFLEX MICROSCOPIC
Bilirubin Urine: NEGATIVE
Glucose, UA: 150 mg/dL — AB
Hgb urine dipstick: NEGATIVE
Ketones, ur: 5 mg/dL — AB
Leukocytes,Ua: NEGATIVE
Nitrite: POSITIVE — AB
Protein, ur: NEGATIVE mg/dL
Specific Gravity, Urine: 1.013 (ref 1.005–1.030)
pH: 6 (ref 5.0–8.0)

## 2023-08-11 LAB — GLUCOSE, CAPILLARY: Glucose-Capillary: 205 mg/dL — ABNORMAL HIGH (ref 70–99)

## 2023-08-11 LAB — TROPONIN I (HIGH SENSITIVITY)
Troponin I (High Sensitivity): 15 ng/L (ref <18)
Troponin I (High Sensitivity): 16 ng/L (ref <18)

## 2023-08-11 LAB — SARS CORONAVIRUS 2 BY RT PCR: SARS Coronavirus 2 by RT PCR: POSITIVE — AB

## 2023-08-11 LAB — MAGNESIUM: Magnesium: 2.1 mg/dL (ref 1.7–2.4)

## 2023-08-11 MED ORDER — INSULIN ASPART 100 UNIT/ML IJ SOLN
0.0000 [IU] | Freq: Three times a day (TID) | INTRAMUSCULAR | Status: DC
  Administered 2023-08-12 (×2): 2 [IU] via SUBCUTANEOUS
  Administered 2023-08-12 – 2023-08-13 (×2): 5 [IU] via SUBCUTANEOUS
  Administered 2023-08-13: 3 [IU] via SUBCUTANEOUS
  Filled 2023-08-11: qty 0.09

## 2023-08-11 MED ORDER — ACETAMINOPHEN 325 MG PO TABS
650.0000 mg | ORAL_TABLET | Freq: Four times a day (QID) | ORAL | Status: DC | PRN
  Administered 2023-08-12 (×3): 650 mg via ORAL
  Filled 2023-08-11 (×3): qty 2

## 2023-08-11 MED ORDER — ASPIRIN 81 MG PO TBEC
81.0000 mg | DELAYED_RELEASE_TABLET | Freq: Every day | ORAL | Status: DC
  Administered 2023-08-12 – 2023-08-13 (×2): 81 mg via ORAL
  Filled 2023-08-11 (×2): qty 1

## 2023-08-11 MED ORDER — INSULIN ASPART 100 UNIT/ML IJ SOLN
0.0000 [IU] | Freq: Every day | INTRAMUSCULAR | Status: DC
  Administered 2023-08-11 – 2023-08-12 (×2): 2 [IU] via SUBCUTANEOUS
  Filled 2023-08-11: qty 0.05

## 2023-08-11 MED ORDER — CARVEDILOL 12.5 MG PO TABS
12.5000 mg | ORAL_TABLET | Freq: Two times a day (BID) | ORAL | Status: DC
  Administered 2023-08-11 – 2023-08-13 (×4): 12.5 mg via ORAL
  Filled 2023-08-11 (×4): qty 1

## 2023-08-11 MED ORDER — ENOXAPARIN SODIUM 40 MG/0.4ML IJ SOSY
40.0000 mg | PREFILLED_SYRINGE | INTRAMUSCULAR | Status: DC
  Administered 2023-08-11 – 2023-08-12 (×2): 40 mg via SUBCUTANEOUS
  Filled 2023-08-11 (×2): qty 0.4

## 2023-08-11 MED ORDER — SODIUM CHLORIDE 0.9% FLUSH
3.0000 mL | Freq: Two times a day (BID) | INTRAVENOUS | Status: DC
  Administered 2023-08-11 – 2023-08-13 (×4): 3 mL via INTRAVENOUS

## 2023-08-11 MED ORDER — ACETAMINOPHEN 650 MG RE SUPP: 650.0000 mg | Freq: Four times a day (QID) | RECTAL | Status: DC | PRN

## 2023-08-11 MED ORDER — ATORVASTATIN CALCIUM 40 MG PO TABS
40.0000 mg | ORAL_TABLET | Freq: Every day | ORAL | Status: DC
  Administered 2023-08-12 – 2023-08-13 (×2): 40 mg via ORAL
  Filled 2023-08-11 (×2): qty 1

## 2023-08-11 MED ORDER — ONDANSETRON HCL 4 MG/2ML IJ SOLN: 4.0000 mg | Freq: Four times a day (QID) | INTRAMUSCULAR | Status: DC | PRN

## 2023-08-11 MED ORDER — ALBUTEROL SULFATE (2.5 MG/3ML) 0.083% IN NEBU: 2.5000 mg | INHALATION_SOLUTION | Freq: Four times a day (QID) | RESPIRATORY_TRACT | Status: DC | PRN

## 2023-08-11 MED ORDER — DORZOLAMIDE HCL 2 % OP SOLN
1.0000 [drp] | Freq: Two times a day (BID) | OPHTHALMIC | Status: DC
  Administered 2023-08-11 – 2023-08-13 (×4): 1 [drp] via OPHTHALMIC
  Filled 2023-08-11: qty 10

## 2023-08-11 MED ORDER — CILOSTAZOL 100 MG PO TABS
100.0000 mg | ORAL_TABLET | Freq: Every day | ORAL | Status: DC
  Administered 2023-08-12 – 2023-08-13 (×2): 100 mg via ORAL
  Filled 2023-08-11 (×2): qty 1

## 2023-08-11 MED ORDER — SENNOSIDES-DOCUSATE SODIUM 8.6-50 MG PO TABS: 1.0000 | ORAL_TABLET | Freq: Every evening | ORAL | Status: DC | PRN

## 2023-08-11 MED ORDER — ONDANSETRON HCL 4 MG PO TABS: 4.0000 mg | ORAL_TABLET | Freq: Four times a day (QID) | ORAL | Status: DC | PRN

## 2023-08-11 MED ORDER — LACTATED RINGERS IV SOLN: INTRAVENOUS | Status: DC

## 2023-08-11 NOTE — ED Notes (Signed)
ED TO INPATIENT HANDOFF REPORT  ED Nurse Name and Phone #: Deon Pilling [phone]  S Name/Age/Gender Amanda Stewart 82 y.o. female Room/Bed: WA14/WA14  Code Status   Code Status: Full Code  Home/SNF/Other Home Patient oriented to: self, place, time, and situation Is this baseline? Yes   Triage Complete: Triage complete  Chief Complaint Orthostasis [I95.1]  Triage Note No notes on file   Allergies Allergies  Allergen Reactions   Metformin And Related Other (See Comments)    Diarrhea     Level of Care/Admitting Diagnosis ED Disposition     ED Disposition  Admit   Condition  --   Comment  Hospital Area: [facility] [[id number]]  Level of Care: Telemetry [5]  Admit to tele based on following criteria: Other see comments  Comments: Presyncope  May place patient in observation at [facility] or [facility] if equivalent level of care is available:: No  Covid Evaluation: Recent COVID positive no isolation required infection day 21-90  Diagnosis: Orthostasis [280655]  Admitting Physician: Charlsie Quest [[id number]]  Attending Physician: Charlsie Quest [[id number]]          B Medical/Surgery History Past Medical History:  Diagnosis Date   Allergic rhinitis    Anemia    Arrhythmia    CAD (coronary artery disease)    Chest pain    Chronic kidney disease, stage 3a (HCC) 08/11/2023   Combined systolic and diastolic heart failure (HCC)    Diabetes mellitus without complication (HCC)    Diabetic peripheral neuropathy (HCC)    GERD (gastroesophageal reflux disease)    Heart block    Hyperlipidemia    Hypertension    Left bundle branch block    Low back pain    Myocardial infarction (HCC)    Peripheral arterial disease (HCC)    Retinopathy    Past Surgical History:  Procedure Laterality Date   ABDOMINAL HYSTERECTOMY     LEFT AND RIGHT HEART CATHETERIZATION WITH CORONARY ANGIOGRAM N/A 05/26/2014   Procedure: LEFT AND RIGHT HEART CATHETERIZATION WITH  CORONARY ANGIOGRAM;  Surgeon: [name], MD;  Location: [facility] CATH LAB;  Service: Cardiovascular;  Laterality: N/A;   PACEMAKER IMPLANT N/A 08/08/2021   Procedure: PACEMAKER IMPLANT;  Surgeon: Lanier Prude, MD;  Location: [facility] INVASIVE CV LAB;  Service: Cardiovascular;  Laterality: N/A;   TEMPORARY PACEMAKER N/A 08/07/2021   Procedure: TEMPORARY PACEMAKER;  Surgeon: Rinaldo Cloud, MD;  Location: [facility] INVASIVE CV LAB;  Service: Cardiovascular;  Laterality: N/A;     A IV Location/Drains/Wounds Patient Lines/Drains/Airways Status     Active Line/Drains/Airways     Name Placement date Placement time Site Days   Peripheral IV 08/11/23 22 G Anterior;Distal;Left Forearm 08/11/23  1903  Forearm  less than 1   Incision (Closed) 08/08/21 Chest Left;Upper 08/08/21  2000  -- 733            Intake/Output Last 24 hours No intake or output data in the 24 hours ending 08/11/23 1957  Labs/Imaging Results for orders placed or performed during the hospital encounter of 08/11/23 (from the past 48 hour(s))  Basic metabolic panel     Status: Abnormal   Collection Time: 08/11/23  4:30 PM  Result Value Ref Range   Sodium 134 (L) 135 - 145 mmol/L   Potassium 4.2 3.5 - 5.1 mmol/L   Chloride 102 98 - 111 mmol/L   CO2 24 22 - 32 mmol/L   Glucose, Bld 256 (H) 70 - 99 mg/dL  Comment: Glucose reference range applies only to samples taken after fasting for at least 8 hours.   BUN 20 8 - 23 mg/dL   Creatinine, Ser 5.28 (H) 0.44 - 1.00 mg/dL   Calcium 9.1 8.9 - 41.3 mg/dL   GFR, Estimated 44 (L) >60 mL/min    Comment: (NOTE) Calculated using the CKD-EPI Creatinine Equation (2021)    Anion gap 8 5 - 15    Comment: Performed at [facility], [address], [place], [place] [address]  CBC with Differential     Status: Abnormal   Collection Time: 08/11/23  4:30 PM  Result Value Ref Range   WBC 5.3 4.0 - 10.5 K/uL   RBC 3.51 (L) 3.87 - 5.11 MIL/uL   Hemoglobin 11.1 (L) 12.0 -  15.0 g/dL   HCT 02.7 (L) 25.3 - 66.4 %   MCV 99.1 80.0 - 100.0 fL   MCH 31.6 26.0 - 34.0 pg   MCHC 31.9 30.0 - 36.0 g/dL   RDW 40.3 47.4 - 25.9 %   Platelets 184 150 - 400 K/uL   nRBC 0.0 0.0 - 0.2 %   Neutrophils Relative % 60 %   Neutro Abs 3.2 1.7 - 7.7 K/uL   Lymphocytes Relative 27 %   Lymphs Abs 1.5 0.7 - 4.0 K/uL   Monocytes Relative 10 %   Monocytes Absolute 0.5 0.1 - 1.0 K/uL   Eosinophils Relative 1 %   Eosinophils Absolute 0.1 0.0 - 0.5 K/uL   Basophils Relative 1 %   Basophils Absolute 0.0 0.0 - 0.1 K/uL   Immature Granulocytes 1 %   Abs Immature Granulocytes 0.03 0.00 - 0.07 K/uL    Comment: Performed at [facility], [address], [place], [place] [address]  Troponin I (High Sensitivity)     Status: None   Collection Time: 08/11/23  4:30 PM  Result Value Ref Range   Troponin I (High Sensitivity) 15 <18 ng/L    Comment: (NOTE) Elevated high sensitivity troponin I (hsTnI) values and significant  changes across serial measurements may suggest ACS but many other  chronic and acute conditions are known to elevate hsTnI results.  Refer to the "Links" section for chest pain algorithms and additional  guidance. Performed at [facility], [address], [place], [place] [address]   Magnesium     Status: None   Collection Time: 08/11/23  4:30 PM  Result Value Ref Range   Magnesium 2.1 1.7 - 2.4 mg/dL    Comment: Performed at [facility], [address] [place], [place] [address]  SARS Coronavirus 2 by RT PCR (hospital order, performed in [facility] hospital lab) *cepheid single result test* Anterior Nasal Swab     Status: Abnormal   Collection Time: 08/11/23  4:53 PM   Specimen: Anterior Nasal Swab  Result Value Ref Range   SARS Coronavirus 2 by RT PCR POSITIVE (A) NEGATIVE    Comment: (NOTE) SARS-CoV-2 target nucleic acids are DETECTED  SARS-CoV-2 RNA is generally detectable in upper respiratory  specimens  during the acute phase of infection.  Positive results are indicative  of the presence of the identified virus, but do not rule out bacterial infection or co-infection with other pathogens not detected by the test.  Clinical correlation with patient history and  other diagnostic information is necessary to determine patient infection status.  The expected result is negative.  Fact Sheet for Patients:   RoadLapTop.co.za   Fact Sheet  for Healthcare Providers:   http://kim-miller.com/    This test is not yet approved or cleared by the [place] and  has been authorized for detection and/or diagnosis of SARS-CoV-2 by FDA under an Emergency Use Authorization (EUA).  This EUA will remain in effect (meaning this test can be used) for the duration of  the COVID-19 declaration under Section 564(b)(1)  of the Act, 21 U.S.C. section 360-bbb-3(b)(1), unless the authorization is terminated or revoked sooner.   Performed at [facility], [address] [place], [place] [address]   Troponin I (High Sensitivity)     Status: None   Collection Time: 08/11/23  6:00 PM  Result Value Ref Range   Troponin I (High Sensitivity) 16 <18 ng/L    Comment: (NOTE) Elevated high sensitivity troponin I (hsTnI) values and significant  changes across serial measurements may suggest ACS but many other  chronic and acute conditions are known to elevate hsTnI results.  Refer to the "Links" section for chest pain algorithms and additional  guidance. Performed at [facility], [address], [place], [place] [address]    DG Chest 2 View  Result Date: 08/11/2023 CLINICAL DATA:  Evaluate for pneumonia EXAM: CHEST - 2 VIEW COMPARISON:  08/09/2021 FINDINGS: There is a left chest wall pacer device with leads in the right atrial appendage and right ventricle. Mild cardiac enlargement. Low lung volumes. No pleural  fluid or interstitial edema. No airspace opacities. Spondylosis noted within the thoracic spine. IMPRESSION: 1. Low lung volumes. 2. No acute findings. Electronically Signed   By: Signa Kell M.D.   On: 08/11/2023 16:42    Pending Labs Unresulted Labs (From admission, onward)     Start     Ordered   08/12/23 0500  CBC  Tomorrow morning,   R        08/11/23 1931   08/12/23 0500  Basic metabolic panel  Tomorrow morning,   R        08/11/23 1931   08/12/23 0500  Hemoglobin A1c  Tomorrow morning,   R       Comments: To assess prior glycemic control    08/11/23 1931   08/11/23 1600  Urinalysis, Routine w reflex microscopic -Urine, Clean Catch  Once,   URGENT       Question:  Specimen Source  Answer:  Urine, Clean Catch   08/11/23 1559            Vitals/Pain Today's Vitals   08/11/23 1800 08/11/23 1815 08/11/23 1845 08/11/23 1916  BP: 125/70 (!) 142/65 (!) 153/71 (!) 144/69  Pulse: 91 88 91 95  Resp: 15 16 12 19   Temp:    98.2 F (36.8 C)  TempSrc:    Oral  SpO2: 98% 97% 98% 97%  PainSc:        Isolation Precautions No active isolations  Medications Medications  enoxaparin (LOVENOX) injection 40 mg (has no administration in time range)  sodium chloride flush (NS) 0.9 % injection 3 mL (has no administration in time range)  lactated ringers infusion (has no administration in time range)  acetaminophen (TYLENOL) tablet 650 mg (has no administration in time range)    Or  acetaminophen (TYLENOL) suppository 650 mg (has no administration in time range)  ondansetron (ZOFRAN) tablet 4 mg (has no administration in time range)    Or  ondansetron (ZOFRAN) injection 4 mg (has no administration in time range)  senna-docusate (Senokot-S) tablet 1 tablet (has no administration  in time range)  insulin aspart (novoLOG) injection 0-9 Units (has no administration in time range)  insulin aspart (novoLOG) injection 0-5 Units (has no administration in time range)     Mobility non-ambulatory     Focused Assessments    R Recommendations: See Admitting Provider Note  Report given to:   Additional Notes:

## 2023-08-11 NOTE — ED Provider Notes (Signed)
[place] EMERGENCY DEPARTMENT AT [facility] Provider Note   CSN: [id number] Arrival date & time: 08/11/23  1550     History  Chief Complaint  Patient presents with   Weakness   Fall    Amanda Stewart is a 82 y.o. 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  Patient ports been ongoing for "a few weeks".  She says she feels very fatigued, frequently lightheaded, particularly with standing up.  She has had multiple falls at home as a result of this.  She reports that her hands feel weak and she has been "dropping coffee mugs."  Cardiologist did attempt to stop some patient's blood pressure medications, amlodipine and hydralazine 3 days ago as a result of concern for drop in blood pressure when the patient stands up.  Her daughter reports pt had covid August 15th, which was the onset of her symptoms.  HPI     Home Medications Prior to Admission medications   Medication Sig Start Date End Date Taking? Authorizing Provider  acetaminophen (TYLENOL) 325 MG tablet Take 1-2 tablets (325-650 mg total) by mouth every 4 (four) hours as needed for mild pain. 08/10/21   [name], MD  albuterol (VENTOLIN HFA) 108 (90 Base) MCG/ACT inhaler Inhale 2 puffs into the lungs every 6 (six) hours as needed for wheezing or shortness of breath.    [provider]  amLODipine (NORVASC) 10 MG tablet Take 10 mg by mouth daily.    [provider]  aspirin EC 81 MG tablet Take 81 mg by mouth daily.    [provider]  atorvastatin (LIPITOR) 40 MG tablet Take 40 mg by mouth daily.    [provider]  carvedilol (COREG) 12.5 MG tablet Take 12.5 mg by mouth 2 (two) times daily with a meal.    [provider]  cilostazol (PLETAL) 100 MG tablet Take 100 mg by mouth daily.    [provider]   dorzolamide (TRUSOPT) 2 % ophthalmic solution Place 1 drop into both eyes 2 (two) times daily. 07/09/21   [provider]  glimepiride (AMARYL) 2 MG tablet Take 2 mg by mouth daily. 11/13/14   [provider]  hydrALAZINE (APRESOLINE) 50 MG tablet Take 50 mg by mouth 2 (two) times daily. 07/09/21   [provider]  insulin glargine (LANTUS) 100 UNIT/ML Solostar Pen Inject 5 Units into the skin at bedtime. 08/10/21   [name], MD  JARDIANCE 10 MG TABS tablet Take 10 mg by mouth every morning. 07/12/21   [provider]  losartan (COZAAR) 100 MG tablet Take 1 tablet (100 mg total) by mouth daily. 08/10/21   [name], MD  Multiple Vitamin (MULTIVITAMIN WITH MINERALS) TABS tablet Take 1 tablet by mouth daily.    [provider]  nitroGLYCERIN (NITROSTAT) 0.4 MG SL tablet Place 1 tablet (0.4 mg total) under the tongue every 5 (five) minutes x 3 doses as needed for chest pain. 12/12/14   [name], MD  pantoprazole (PROTONIX) 40 MG tablet Take 1 tablet (40 mg total) by mouth daily at 6 (six) AM. 08/11/21   [name], MD      Allergies    Metformin and related    Review of Systems   Review of Systems  Physical Exam Updated Vital Signs BP (!) 144/69 (BP Location: Right Arm)   Pulse 95  Temp 98.2 F (36.8 C) (Oral)   Resp 19   SpO2 97%  Physical Exam Constitutional:      General: She is not in acute distress.    Comments: Hard of hearing  HENT:     Head: Normocephalic and atraumatic.  Eyes:     Conjunctiva/sclera: Conjunctivae normal.     Pupils: Pupils are equal, round, and reactive to light.  Cardiovascular:     Rate and Rhythm: Normal rate and regular rhythm.     Heart sounds: Murmur heard.  Pulmonary:     Effort: Pulmonary effort is normal. No respiratory distress.     Breath sounds: Normal breath sounds.  Abdominal:     General: There is no distension.     Tenderness: There is no abdominal tenderness.  Skin:     General: Skin is warm and dry.  Neurological:     General: No focal deficit present.     Mental Status: She is alert. Mental status is at baseline.  Psychiatric:        Mood and Affect: Mood normal.        Behavior: Behavior normal.     ED Results / Procedures / Treatments   Labs (all labs ordered are listed, but only abnormal results are displayed) Labs Reviewed  SARS CORONAVIRUS 2 BY RT PCR - Abnormal; Notable for the following components:      Result Value   SARS Coronavirus 2 by RT PCR POSITIVE (*)    All other components within normal limits  BASIC METABOLIC PANEL - Abnormal; Notable for the following components:   Sodium 134 (*)    Glucose, Bld 256 (*)    Creatinine, Ser 1.21 (*)    GFR, Estimated 44 (*)    All other components within normal limits  CBC WITH DIFFERENTIAL/PLATELET - Abnormal; Notable for the following components:   RBC 3.51 (*)    Hemoglobin 11.1 (*)    HCT 34.8 (*)    All other components within normal limits  MAGNESIUM  URINALYSIS, ROUTINE W REFLEX MICROSCOPIC  CBC  BASIC METABOLIC PANEL  HEMOGLOBIN A1C  TROPONIN I (HIGH SENSITIVITY)  TROPONIN I (HIGH SENSITIVITY)    EKG EKG Interpretation Date/Time:  Saturday August 11 2023 16:03:40 EDT Ventricular Rate:  86 PR Interval:  240 QRS Duration:  142 QT Interval:  417 QTC Calculation: 499 R Axis:   -54  Text Interpretation: Potential V paced rhythm, IVCD with known pacemaker, no significant change in morphology since Sept 13 2022 ecg Confirmed by Alvester Chou [phone]) on 08/11/2023 4:05:51 PM  Radiology DG Chest 2 View  Result Date: 08/11/2023 CLINICAL DATA:  Evaluate for pneumonia EXAM: CHEST - 2 VIEW COMPARISON:  08/09/2021 FINDINGS: There is a left chest wall pacer device with leads in the right atrial appendage and right ventricle. Mild cardiac enlargement. Low lung volumes. No pleural fluid or interstitial edema. No airspace opacities. Spondylosis noted within the thoracic spine.  IMPRESSION: 1. Low lung volumes. 2. No acute findings. Electronically Signed   By: Signa Kell M.D.   On: 08/11/2023 16:42    Procedures Procedures    Medications Ordered in ED Medications  enoxaparin (LOVENOX) injection 40 mg (has no administration in time range)  sodium chloride flush (NS) 0.9 % injection 3 mL (has no administration in time range)  lactated ringers infusion (has no administration in time range)  acetaminophen (TYLENOL) tablet 650 mg (has no administration in time range)    Or  acetaminophen (TYLENOL) suppository 650  mg (has no administration in time range)  ondansetron (ZOFRAN) tablet 4 mg (has no administration in time range)    Or  ondansetron (ZOFRAN) injection 4 mg (has no administration in time range)  senna-docusate (Senokot-S) tablet 1 tablet (has no administration in time range)  insulin aspart (novoLOG) injection 0-9 Units (has no administration in time range)  insulin aspart (novoLOG) injection 0-5 Units (has no administration in time range)    ED Course/ Medical Decision Making/ A&P Clinical Course as of 08/11/23 1942  Sat Aug 11, 2023  1737 SARS Coronavirus 2 by RT PCR(!): POSITIVE [MT]  1855 Daughter updated - will admit to hospitalist for PT eval and likely SNF placement vs home health.  Spoke to Dr Allena Katz for admission. [MT]    Clinical Course User Index [MT] Ellarose Brandi, Kermit Balo, MD                                 Medical Decision Making Amount and/or Complexity of Data Reviewed Labs: ordered. Decision-making details documented in ED Course. Radiology: ordered. ECG/medicine tests: ordered.  Risk Decision regarding hospitalization.   This patient presents to the ED with concern for fatigue, lightheadedness, weakness. This involves an extensive number of treatment options, and is a complaint that carries with it a high risk of complications and morbidity.  The differential diagnosis includes cardiac arrhythmia versus anemia versus infection  versus metabolic derangement versus other  Co-morbidities that complicate the patient evaluation: History of coronary disease and cardiac disease at high risk of cardiovascular disease  Additional history obtained from EMS  External records from outside source obtained and reviewed including cardiology office eval Dr Anne Fu Oct 2023 with cardiac history as noted above.  Patient's blood pressure appears to been controlled in the past on hydralazine and amlodipine which is now being held.  I ordered and personally interpreted labs.  The pertinent results include:  Covid positive.  Labs unremarkable.  I ordered imaging studies including dg chest I independently visualized and interpreted imaging which showed no emergent finding I agree with the radiologist interpretation  The patient was maintained on a cardiac monitor.  I personally viewed and interpreted the cardiac monitored which showed an underlying rhythm of: NSR  Per my interpretation the patient's ECG shows no acute ischemic findings  I have reviewed the patients home medicines and have made adjustments as needed  Test Considered: doubt CVA/ICH   After the interventions noted above, I reevaluated the patient and found that they have: stayed the same   Dispostion:  After consideration of the diagnostic results and the patients response to treatment, I feel that the patent would benefit from medical admission.  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  After discussion with the patient's family we have opted to admit her to the hospital this time for PT evaluation.  Would appreciate inpatient social work evaluation for potential placement versus home health with the daughters.  Given that the patient had active COVID nearly one month ago, she is no longer contagious on precautions here.         Final Clinical Impression(s) / ED Diagnoses Final  diagnoses:  Weakness    Rx / DC Orders ED Discharge Orders     None         Guillermina Shaft, Kermit Balo, MD 08/11/23 6962

## 2023-08-11 NOTE — H&P (Signed)
History and Physical    Amanda Stewart VHQ:[id number] DOB: [birth year] DOA: 08/11/2023  PCP: Fleet Contras, MD  Patient coming from: Home  I have personally briefly reviewed patient's old medical records in [facility]  Chief Complaint: Generalized weakness, lightheadedness  HPI: Amanda Stewart is a 82 y.o. female with medical history significant for CAD, CHB s/p PPM, PAD, CKD stage IIIa, T2DM, HTN, HLD who presented to the ED for evaluation of generalized weakness and lightheadedness.  Patient states for the last 2 weeks she has been feeling generally weak and fatigued.  She has been having difficulty holding onto things and has been dropping coffee most due to feeling weak in her hands.  She has had some brain fog.  She becomes very lightheaded/dizzy when she stands up.  This resolves when she sits back down and rest for a while.  She has not passed out but has had quite a few falls at home without significant injury.  She lives alone.  She had another fall today and had to call her daughters to help her up and she was subsequently brought to the hospital.  She says she was only down for about 15 minutes before they were able to assist her.  She reportedly tested positive for COVID-19 on 8/15.  Per documentation, her home health nurse reported that her blood pressure was dropping by 10 when she was standing up.  Yesterday she was advised to hold her hydralazine and amlodipine by her cardiology team.  She otherwise denies any chest pain, dyspnea, nausea, vomiting, abdominal pain, diarrhea, dysuria, or bodyaches.  ED Course  Labs/Imaging on admission: I have personally reviewed following labs and imaging studies.  Initial vitals showed BP 122/69, pulse 89, RR 15, temp 98.3 F, SpO2 100% on room air.  Orthostatic vitals attempted, BP 131/61 supine, 127/67 sitting, standing vitals limited due to patient becoming symptomatic, very lightheaded.  Labs show WBC 5.3, hemoglobin 11.1, platelets  184,000, sodium 134, potassium 4.2, bicarb 24, BUN 20, creatinine 1.21, serum glucose 256, troponin 15 > 16, magnesium 2.1.  SARS-CoV-2 PCR is positive.  2 view chest x-ray shows low lung volumes without focal consolidation, edema, effusion.  Left chest wall PPM seen in place.  The hospitalist service was consulted to admit for further evaluation and management.  Review of Systems: All systems reviewed and are negative except as documented in history of present illness above.   Past Medical History:  Diagnosis Date   Allergic rhinitis    Anemia    Arrhythmia    CAD (coronary artery disease)    Chest pain    Chronic kidney disease, stage 3a (HCC) 08/11/2023   Combined systolic and diastolic heart failure (HCC)    Diabetes mellitus without complication (HCC)    Diabetic peripheral neuropathy (HCC)    GERD (gastroesophageal reflux disease)    Heart block    Hyperlipidemia    Hypertension    Left bundle branch block    Low back pain    Myocardial infarction (HCC)    Peripheral arterial disease (HCC)    Retinopathy     Past Surgical History:  Procedure Laterality Date   ABDOMINAL HYSTERECTOMY     LEFT AND RIGHT HEART CATHETERIZATION WITH CORONARY ANGIOGRAM N/A 05/26/2014   Procedure: LEFT AND RIGHT HEART CATHETERIZATION WITH CORONARY ANGIOGRAM;  Surgeon: [name], MD;  Location: [facility] CATH LAB;  Service: Cardiovascular;  Laterality: N/A;   PACEMAKER IMPLANT N/A 08/08/2021   Procedure: PACEMAKER IMPLANT;  Surgeon: Lanier Prude, MD;  Location: [facility] INVASIVE CV LAB;  Service: Cardiovascular;  Laterality: N/A;   TEMPORARY PACEMAKER N/A 08/07/2021   Procedure: TEMPORARY PACEMAKER;  Surgeon: Rinaldo Cloud, MD;  Location: [facility] INVASIVE CV LAB;  Service: Cardiovascular;  Laterality: N/A;    Social History:  reports that she has never smoked. She has never used smokeless tobacco. She reports current alcohol use of about 1.0 standard drink of alcohol per week. She reports that she  does not use drugs.  Allergies  Allergen Reactions   Metformin And Related Other (See Comments)    Diarrhea     Family History  Problem Relation Age of Onset   Diabetes Mother    Diabetes Father    Stroke Father      Prior to Admission medications   Medication Sig Start Date End Date Taking? Authorizing Provider  acetaminophen (TYLENOL) 325 MG tablet Take 1-2 tablets (325-650 mg total) by mouth every 4 (four) hours as needed for mild pain. 08/10/21   [name], MD  albuterol (VENTOLIN HFA) 108 (90 Base) MCG/ACT inhaler Inhale 2 puffs into the lungs every 6 (six) hours as needed for wheezing or shortness of breath.    [provider]  amLODipine (NORVASC) 10 MG tablet Take 10 mg by mouth daily.    [provider]  aspirin EC 81 MG tablet Take 81 mg by mouth daily.    [provider]  atorvastatin (LIPITOR) 40 MG tablet Take 40 mg by mouth daily.    [provider]  carvedilol (COREG) 12.5 MG tablet Take 12.5 mg by mouth 2 (two) times daily with a meal.    [provider]  cilostazol (PLETAL) 100 MG tablet Take 100 mg by mouth daily.    [provider]  dorzolamide (TRUSOPT) 2 % ophthalmic solution Place 1 drop into both eyes 2 (two) times daily. 07/09/21   [provider]  glimepiride (AMARYL) 2 MG tablet Take 2 mg by mouth daily. 11/13/14   [provider]  hydrALAZINE (APRESOLINE) 50 MG tablet Take 50 mg by mouth 2 (two) times daily. 07/09/21   [provider]  insulin glargine (LANTUS) 100 UNIT/ML Solostar Pen Inject 5 Units into the skin at bedtime. 08/10/21   [name], MD  JARDIANCE 10 MG TABS tablet Take 10 mg by mouth every morning. 07/12/21   [provider]  losartan (COZAAR) 100 MG tablet Take 1 tablet (100 mg total) by mouth daily. 08/10/21   [name], MD  Multiple Vitamin (MULTIVITAMIN WITH MINERALS) TABS tablet Take 1 tablet by mouth daily.    [provider]   nitroGLYCERIN (NITROSTAT) 0.4 MG SL tablet Place 1 tablet (0.4 mg total) under the tongue every 5 (five) minutes x 3 doses as needed for chest pain. 12/12/14   [name], MD  pantoprazole (PROTONIX) 40 MG tablet Take 1 tablet (40 mg total) by mouth daily at 6 (six) AM. 08/11/21   [name], MD    Physical Exam: Vitals:   08/11/23 1800 08/11/23 1815 08/11/23 1845 08/11/23 1916  BP: 125/70 (!) 142/65 (!) 153/71 (!) 144/69  Pulse: 91 88 91 95  Resp: 15 16 12 19   Temp:    98.2 F (36.8 C)  TempSrc:    Oral  SpO2: 98% 97% 98% 97%   Constitutional: Resting in bed, NAD, calm, comfortable Eyes: EOMI, lids and conjunctivae normal ENMT: Hard of hearing.  Mucous membranes are dry. Posterior pharynx clear of any exudate or  lesions.Normal dentition.  Neck: normal, supple, no masses. Respiratory: clear to auscultation bilaterally, no wheezing, no crackles. Normal respiratory effort. No accessory muscle use.  Cardiovascular: Regular rate and rhythm, 3/6 systolic murmur.  PPM in place left chest wall.  No extremity edema. 2+ pedal pulses. Abdomen: no tenderness, no masses palpated.  Musculoskeletal: no clubbing / cyanosis. No joint deformity upper and lower extremities. Good ROM, no contractures. Normal muscle tone.  Skin: no rashes, lesions, ulcers. No induration Neurologic: Sensation intact. Strength 5/5 in all 4.  Psychiatric: Normal judgment and insight. Alert and oriented x 3. Normal mood.   EKG: Personally reviewed.  V-paced rhythm, similar to prior  Assessment/Plan Principal Problem:   Orthostasis Active Problems:   Long COVID   CAD (coronary artery disease)   Diabetes mellitus, type 2 (HCC)   Complete heart block (HCC)   Chronic kidney disease, stage 3a (HCC)   Hypertension associated with diabetes (HCC)   Hyperlipidemia associated with type 2 diabetes mellitus (HCC)   PAD (peripheral artery disease) (HCC)   Amanda Stewart is a 82 y.o. female with medical history significant  for CAD, CHB s/p PPM, PAD, CKD stage IIIa, T2DM, HTN, HLD who is admitted with presyncope/orthostasis in setting of COVID-19.  Assessment and Plan: Presyncope/orthostasis: Very symptomatic upon standing, likely orthostatic hypotension in setting of long COVID.  No LOC or significant injury from falls.  Strength is intact on admission.  She does have prominent systolic murmur.  TTE in 2022 showed moderate aortic stenosis. -Start IV fluid hydration overnight -Keep on telemetry -Obtain echocardiogram -PT/OT eval, fall precautions -Hold home antihypertensives and Jardiance  Recent COVID-19 viral infection: Per report initially tested positive on 8/15.  CXR negative for pneumonia.  Saturating well on room air.  Symptoms of fatigue, generalized weakness, "brain fog" consistent with long COVID syndrome. -Supportive care, PT/OT as above  Coronary artery disease: Denies chest pain.  Troponin negative x 2.  Continue aspirin and atorvastatin.  CKD stage IIIa: Renal function stable, continue to monitor.  Type 2 diabetes: Placed on SSI.  Holding Amaryl and Jardiance.  Hypertension: Holding home amlodipine, hydralazine, losartan.  Resume Coreg tomorrow.  Peripheral artery disease: Continue aspirin, Pletal, atorvastatin.  Hyperlipidemia: Continue atorvastatin.  CHB s/p PPM   DVT prophylaxis: enoxaparin (LOVENOX) injection 40 mg Start: 08/11/23 2200 Code Status: Full code, confirmed with patient on admission Family Communication: Discussed with patient, she has discussed with family Disposition Plan: From home, dispo pending clinical progress Consults called: None Severity of Illness: The appropriate patient status for this patient is OBSERVATION. Observation status is judged to be reasonable and necessary in order to provide the required intensity of service to ensure the patient's safety. The patient's presenting symptoms, physical exam findings, and initial radiographic and laboratory  data in the context of their medical condition is felt to place them at decreased risk for further clinical deterioration. Furthermore, it is anticipated that the patient will be medically stable for discharge from the hospital within 2 midnights of admission.   Darreld Mclean MD [facility]  If 7PM-7AM, please contact night-coverage [email or web address]  08/11/2023, 8:06 PM

## 2023-08-11 NOTE — Hospital Course (Signed)
Amanda Stewart is an 82 y.o. female with PMH CAD, CHB s/p PPM (2022), PAD, CKD3a, DMII, HTN, HLD who presented to the hospital with recurrent falls at home, weakness, lightheadedness, and recent drop in blood pressure with standing. Of note, she tested positive for COVID-19 at an urgent care on 07/12/23 and completed outpatient course of anti-viral treatment. Ever since, she has had ongoing difficulty with regaining her strength back and has developed "brain fog" as well as some recent lightheadedness and dizziness when standing.  They contacted cardiology office and were told to hold amlodipine and hydralazine just prior to admission, however due to ongoing symptoms, she was brought to the ER for further evaluation.  Orthostatics were attempted on admission but she was unable to complete the standing for 3 minutes due to becoming symptomatic and lightheaded.  However, orthostatics were positive with systolic drop from lying to standing (131 >> 110).   She was admitted for further evaluation and monitoring in setting of orthostasis and weakness.  Echo also repeated/updated and cardiology evaluation requested as well. BP regimen was adjusted (lopressor substituted for coreg) and remainder home regimen to be resumed slowly 1 at a time as BP can tolerate (explained out in AVS).  No further orthostasis on repeat orthostatic check.  She plans to follow-up outpatient with cardiology as well. She was also evaluated by PT/OT and family elected for bringing patient home as she has 24/7 care and they will continue with [facility].

## 2023-08-11 NOTE — ED Notes (Signed)
St Jude pacemaker report- Thurs-fri  there was 3 atrial episodes, longest was 8 sec, otherwise unremarkable.

## 2023-08-11 NOTE — ED Notes (Addendum)
During ortho VS, pt felt fine during the lying and sitting portion. Upon initially standing up pt was extremely light-headed and took the pt three tries to stand up. Pt continued to feel extremely light-headed and physically weak, and unstable on their feet. Pt stated, "I feel like I am about to fall." Pt was unable to finish the ortho VS.

## 2023-08-11 NOTE — ED Notes (Signed)
St Jude pacemaker interrogated at this time

## 2023-08-12 ENCOUNTER — Observation Stay (HOSPITAL_BASED_OUTPATIENT_CLINIC_OR_DEPARTMENT_OTHER): Payer: 59

## 2023-08-12 DIAGNOSIS — R5381 Other malaise: Secondary | ICD-10-CM | POA: Diagnosis not present

## 2023-08-12 DIAGNOSIS — R011 Cardiac murmur, unspecified: Secondary | ICD-10-CM

## 2023-08-12 DIAGNOSIS — Z8616 Personal history of COVID-19: Secondary | ICD-10-CM

## 2023-08-12 DIAGNOSIS — I951 Orthostatic hypotension: Secondary | ICD-10-CM | POA: Diagnosis not present

## 2023-08-12 LAB — BASIC METABOLIC PANEL
Anion gap: 7 (ref 5–15)
BUN: 14 mg/dL (ref 8–23)
CO2: 27 mmol/L (ref 22–32)
Calcium: 8.8 mg/dL — ABNORMAL LOW (ref 8.9–10.3)
Chloride: 101 mmol/L (ref 98–111)
Creatinine, Ser: 0.95 mg/dL (ref 0.44–1.00)
GFR, Estimated: 59 mL/min — ABNORMAL LOW (ref >60)
Glucose, Bld: 181 mg/dL — ABNORMAL HIGH (ref 70–99)
Potassium: 4 mmol/L (ref 3.5–5.1)
Sodium: 135 mmol/L (ref 135–145)

## 2023-08-12 LAB — CBC
HCT: 32 % — ABNORMAL LOW (ref 36.0–46.0)
Hemoglobin: 10.3 g/dL — ABNORMAL LOW (ref 12.0–15.0)
MCH: 32.1 pg (ref 26.0–34.0)
MCHC: 32.2 g/dL (ref 30.0–36.0)
MCV: 99.7 fL (ref 80.0–100.0)
Platelets: 171 10*3/uL (ref 150–400)
RBC: 3.21 MIL/uL — ABNORMAL LOW (ref 3.87–5.11)
RDW: 13.5 % (ref 11.5–15.5)
WBC: 4.9 10*3/uL (ref 4.0–10.5)
nRBC: 0 % (ref 0.0–0.2)

## 2023-08-12 LAB — GLUCOSE, CAPILLARY
Glucose-Capillary: 182 mg/dL — ABNORMAL HIGH (ref 70–99)
Glucose-Capillary: 287 mg/dL — ABNORMAL HIGH (ref 70–99)
Glucose-Capillary: 176 mg/dL — ABNORMAL HIGH (ref 70–99)
Glucose-Capillary: 235 mg/dL — ABNORMAL HIGH (ref 70–99)

## 2023-08-12 LAB — ECHOCARDIOGRAM COMPLETE
AR max vel: 1.29 cm2
AV Area VTI: 1.38 cm2
AV Area mean vel: 1.27 cm2
AV Mean grad: 17.5 mmHg
AV Peak grad: 26.2 mmHg
Ao pk vel: 2.56 m/s
Area-P 1/2: 3.21 cm2
Height: 65 in
MV VTI: 2.7 cm2
S' Lateral: 2.1 cm
Weight: 2761.92 [oz_av]

## 2023-08-12 MED ORDER — PNEUMOCOCCAL 20-VAL CONJ VACC 0.5 ML IM SUSY
0.5000 mL | PREFILLED_SYRINGE | INTRAMUSCULAR | Status: AC
  Administered 2023-08-13: 0.5 mL via INTRAMUSCULAR
  Filled 2023-08-12: qty 0.5

## 2023-08-12 MED ORDER — PERFLUTREN LIPID MICROSPHERE
1.0000 mL | INTRAVENOUS | Status: AC | PRN
  Administered 2023-08-12: 2 mL via INTRAVENOUS

## 2023-08-12 MED ORDER — MELATONIN 5 MG PO TABS
5.0000 mg | ORAL_TABLET | Freq: Every evening | ORAL | Status: AC | PRN
  Administered 2023-08-12 (×2): 5 mg via ORAL
  Filled 2023-08-12 (×2): qty 1

## 2023-08-12 MED ORDER — ENSURE ENLIVE PO LIQD
237.0000 mL | Freq: Two times a day (BID) | ORAL | Status: DC
  Administered 2023-08-12 – 2023-08-13 (×3): 237 mL via ORAL

## 2023-08-12 NOTE — Assessment & Plan Note (Signed)
-   diagnosis 07/12/23 but she seems to be reporting some symptoms concerning for lingering covid symptoms such as the weakness and concentration difficulty "brain fog" - discussed with patient and daughters bedside; supportive care for now

## 2023-08-12 NOTE — Assessment & Plan Note (Addendum)
-   due to orthostasis, meds being slowly resumed at discharge

## 2023-08-12 NOTE — Assessment & Plan Note (Signed)
-   on asa, statin, pletal

## 2023-08-12 NOTE — Assessment & Plan Note (Signed)
-   Dizziness and lightheadedness at home with systolic blood pressure drop -On admission, orthostatics obtained: 131/61 >> 127/67 >> 110/80 - repeated today (and are negative) s/p IVF and holding all BP meds except for coreg  - need to get cardiology input on if orthostasis could be related to echo findings

## 2023-08-12 NOTE — Assessment & Plan Note (Signed)
-   On Lantus, Amaryl, Jardiance at home -Continue SSI and CBG monitoring

## 2023-08-12 NOTE — Plan of Care (Signed)
  Problem: Fluid Volume: Goal: Ability to maintain a balanced intake and output will improve Outcome: Progressing   Problem: Metabolic: Goal: Ability to maintain appropriate glucose levels will improve Outcome: Progressing   Problem: Nutritional: Goal: Maintenance of adequate nutrition will improve Outcome: Progressing Goal: Progress toward achieving an optimal weight will improve Outcome: Progressing   Problem: Skin Integrity: Goal: Risk for impaired skin integrity will decrease Outcome: Progressing   Problem: Tissue Perfusion: Goal: Adequacy of tissue perfusion will improve Outcome: Progressing   Problem: Clinical Measurements: Goal: Respiratory complications will improve Outcome: Progressing Goal: Cardiovascular complication will be avoided Outcome: Progressing   Problem: Safety: Goal: Ability to remain free from injury will improve Outcome: Progressing   Problem: Skin Integrity: Goal: Risk for impaired skin integrity will decrease Outcome: Progressing

## 2023-08-12 NOTE — Plan of Care (Signed)

## 2023-08-12 NOTE — Evaluation (Signed)
Physical Therapy Evaluation Patient Details Name: Amanda Stewart MRN: [id number] DOB: 1941/08/09 Today's Date: 08/12/2023  History of Present Illness  Patient is a 82 year old female who presented on 9/14 with lightheadedness, and generalized weakness. Patient reported having had 8 falls in last few weeks and recent COVID 19.  PMH: DM II, peripheral artery disease, hyperlipidemia, CKD, coronary artery disease.  Clinical Impression  Pt admitted with above diagnosis.  Pt currently with functional limitations due to the deficits listed below (see PT Problem List). Pt will benefit from acute skilled PT to increase their independence and safety with mobility to allow discharge.  Recommend 24/7 S due to HIGH RISK for FALLS.  She has had 8 in the last few weeks. She demonstrated buckeling and unsteadiness upon standing.         If plan is discharge home, recommend the following: A little help with bathing/dressing/bathroom;A lot of help with walking and/or transfers   Can travel by private vehicle        Equipment Recommendations None recommended by PT  Recommendations for Other Services       Functional Status Assessment Patient has had a recent decline in their functional status and demonstrates the ability to make significant improvements in function in a reasonable and predictable amount of time.     Precautions / Restrictions Precautions Precautions: Fall Precaution Comments: 8 falls in the last several weeks Restrictions Weight Bearing Restrictions: No      Mobility  Bed Mobility Overal bed mobility: Needs Assistance Bed Mobility: Supine to Sit     Supine to sit: Contact guard     General bed mobility comments: slow transition to EOB    Transfers Overall transfer level: Needs assistance Equipment used: Rolling walker (2 wheels) Transfers: Sit to/from Stand, Bed to chair/wheelchair/BSC Sit to Stand: +2 physical assistance, Mod assist   Step pivot transfers: +2 physical  assistance, Mod assist       General transfer comment: Immediate weakness noted in legs in standing (which took 2 attempts). buckeling and heavy reliance on her RW. Short shuffeled steps to chair with RW and +2 assistance. Sat and rested.  Stood again, and she reported she felt even weaker although needed same amount of help.    Ambulation/Gait               General Gait Details: step pivot only with shuffeling feet  Stairs            Wheelchair Mobility     Tilt Bed    Modified Rankin (Stroke Patients Only)       Balance Overall balance assessment: Needs assistance Sitting-balance support: Feet supported Sitting balance-Leahy Scale: Fair     Standing balance support: Reliant on assistive device for balance, Bilateral upper extremity supported Standing balance-Leahy Scale: Poor                               Pertinent Vitals/Pain Pain Assessment Pain Assessment: No/denies pain    Home Living Family/patient expects to be discharged to:: Private residence Living Arrangements: Alone Available Help at Discharge: Family Type of Home: Apartment Home Access: Elevator       Home Layout: One level Home Equipment: Agricultural consultant (2 wheels);Grab bars - toilet;Lift chair;Other (comment)      Prior Function Prior Level of Function : Independent/Modified Independent  Extremity/Trunk Assessment   Upper Extremity Assessment Upper Extremity Assessment: Defer to OT evaluation    Lower Extremity Assessment Lower Extremity Assessment: Generalized weakness       Communication   Communication Communication: Hearing impairment  Cognition Arousal: Lethargic Behavior During Therapy: Flat affect                                   General Comments: Very little verbal interaction.  Appears groggy- when asked about that she did say she agreed.  HOH        General Comments      Exercises      Assessment/Plan    PT Assessment Patient needs continued PT services  PT Problem List Decreased strength;Decreased activity tolerance;Decreased balance;Decreased mobility       PT Treatment Interventions DME instruction;Gait training;Functional mobility training;Therapeutic activities;Therapeutic exercise;Patient/family education    PT Goals (Current goals can be found in the Care Plan section)  Acute Rehab PT Goals Patient Stated Goal: Get stronger and not have falls PT Goal Formulation: With patient Time For Goal Achievement: 08/26/23 Potential to Achieve Goals: Good    Frequency Min 1X/week     Co-evaluation   Reason for Co-Treatment: To address functional/ADL transfers PT goals addressed during session: Mobility/safety with mobility OT goals addressed during session: ADL's and self-care       AM-PAC PT "6 Clicks" Mobility  Outcome Measure Help needed turning from your back to your side while in a flat bed without using bedrails?: A Little Help needed moving from lying on your back to sitting on the side of a flat bed without using bedrails?: A Little Help needed moving to and from a bed to a chair (including a wheelchair)?: A Lot Help needed standing up from a chair using your arms (e.g., wheelchair or bedside chair)?: A Lot Help needed to walk in hospital room?: A Lot Help needed climbing 3-5 steps with a railing? : A Lot 6 Click Score: 14    End of Session   Activity Tolerance: Patient limited by fatigue Patient left: in chair;with call bell/phone within reach;with chair alarm set Nurse Communication: Mobility status;Other (comment) (MD) PT Visit Diagnosis: Unsteadiness on feet (R26.81);Repeated falls (R29.6);History of falling (Z91.81);Muscle weakness (generalized) (M62.81)    Time: 1610-9604 PT Time Calculation (min) (ACUTE ONLY): 29 min   Charges:   PT Evaluation $PT Eval Moderate Complexity: 1 Mod   PT General Charges $$ ACUTE PT VISIT: 1 Visit          Colin Broach., PT Office [phone] Acute Rehab 08/12/2023   Enzo Montgomery 08/12/2023, 1:29 PM

## 2023-08-12 NOTE — Assessment & Plan Note (Addendum)
-   Patient has been having multiple falls at home recently with some associated dizziness and lightheadedness.  Patient and family deny that she has ever passed out though. 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, but I do not appreciate any focal deficit at this time; no further stroke workup warranted  -PT/OT evals ordered, appreciate workup.  Discussed with daughters and they still are wishing for returning home with home health.  They have multiple DME and will be staying with patient

## 2023-08-12 NOTE — Assessment & Plan Note (Signed)
-   patient has history of CKD3a. Baseline creat ~ 1.1, eGFR~ 50

## 2023-08-12 NOTE — Assessment & Plan Note (Signed)
continue lipitor ?

## 2023-08-12 NOTE — Assessment & Plan Note (Signed)
-   On aspirin, Lipitor, lopressor (instead of coreg now), Jardiance, losartan

## 2023-08-12 NOTE — Evaluation (Signed)
Occupational Therapy Evaluation Patient Details Name: NYLAYA CORPREW MRN: [id number] DOB: [birth year] Today's Date: 08/12/2023   History of Present Illness Patient is a 82 year old female who presented on 9/14 with lightheadedness, and generalized weakness. Patient reported having had 8 falls in last few weeks and recent COVID 19.  PMH: DM II, peripheral artery disease, hyperlipidemia, CKD, coronary artery disease.   Clinical Impression   Patient is a 82 year old female who was admitted for above. Patient was living at [facility] independently with no AD prior level.  Patient currently was +2 for transfer from edge of bed to recliner with BLE unsteady and buckling with first attempt. Patient reporting desire to transition home and that daughters would help at night. Patient would need 24/7 caregiver support to manage patients high falls risk with patient reporting 8 falls within last few weeks. Patient was noted to have decreased functional activity tolerance, decreased endurance, decreased standing balance, decreased safety awareness, and decreased knowledge of AD/AE impacting participation in ADLs. Plan to work on standing balance and transfers to increase independence in Adls during next session. Patient would continue to benefit from skilled OT services at this time while admitted and after d/c to address noted deficits in order to improve overall safety and independence in ADLs.        If plan is discharge home, recommend the following: A lot of help with bathing/dressing/bathroom;Assistance with cooking/housework;Direct supervision/assist for medications management;Assist for transportation;Help with stairs or ramp for entrance;Direct supervision/assist for financial management;Two people to help with walking and/or transfers    Functional Status Assessment  Patient has had a recent decline in their functional status and demonstrates the ability to make significant improvements in function in a  reasonable and predictable amount of time.  Equipment Recommendations  None recommended by OT    Recommendations for Other Services       Precautions / Restrictions Precautions Precautions: Fall Restrictions Weight Bearing Restrictions: No         Balance Overall balance assessment: Needs assistance Sitting-balance support: Feet supported Sitting balance-Leahy Scale: Fair     Standing balance support: Reliant on assistive device for balance, Bilateral upper extremity supported Standing balance-Leahy Scale: Zero         ADL either performed or assessed with clinical judgement   ADL Overall ADL's : Needs assistance/impaired Eating/Feeding: Supervision/ safety;Set up;Sitting   Grooming: Minimal assistance;Sitting   Upper Body Bathing: Minimal assistance;Sitting   Lower Body Bathing: Maximal assistance;Bed level   Upper Body Dressing : Minimal assistance;Sitting   Lower Body Dressing: Maximal assistance;Bed level   Toilet Transfer: +2 for physical assistance;+2 for safety/equipment;Moderate assistance;Stand-pivot;Rolling walker (2 wheels) Toilet Transfer Details (indicate cue type and reason): to recliner in room with patient unsteady and BLE buckling with need to sit down and then make second attempt to transition to recliner. Toileting- Clothing Manipulation and Hygiene: Bed level;Total assistance               Vision   Vision Assessment?: No apparent visual deficits            Pertinent Vitals/Pain Pain Assessment Pain Assessment: No/denies pain     Extremity/Trunk Assessment Upper Extremity Assessment Upper Extremity Assessment: Overall WFL for tasks assessed   Lower Extremity Assessment Lower Extremity Assessment: Defer to PT evaluation       Communication Communication Communication: No apparent difficulties   Cognition Arousal: Lethargic Behavior During Therapy: Flat affect  General  Comments: patient with minimal verbalizations during session. appear almost in fog at times.                Home Living Family/patient expects to be discharged to:: Private residence Living Arrangements: Alone Available Help at Discharge: Family Type of Home: Apartment Home Access: Elevator     Home Layout: One level     Bathroom Shower/Tub: Chief Strategy Officer: Standard     Home Equipment: Agricultural consultant (2 wheels);Grab bars - toilet;Lift chair;Other (comment)          Prior Functioning/Environment Prior Level of Function : Independent/Modified Independent                        OT Problem List: Decreased activity tolerance;Impaired balance (sitting and/or standing);Decreased coordination;Decreased safety awareness;Decreased knowledge of precautions;Decreased knowledge of use of DME or AE      OT Treatment/Interventions: Self-care/ADL training;DME and/or AE instruction;Therapeutic exercise;Therapeutic activities;Patient/family education;Balance training    OT Goals(Current goals can be found in the care plan section) Acute Rehab OT Goals Patient Stated Goal: to go home OT Goal Formulation: Patient unable to participate in goal setting Time For Goal Achievement: 08/26/23 Potential to Achieve Goals: Fair  OT Frequency: Min 1X/week    Co-evaluation PT/OT/SLP Co-Evaluation/Treatment: Yes Reason for Co-Treatment: To address functional/ADL transfers PT goals addressed during session: Mobility/safety with mobility OT goals addressed during session: ADL's and self-care      AM-PAC OT "6 Clicks" Daily Activity     Outcome Measure Help from another person eating meals?: A Little Help from another person taking care of personal grooming?: A Little Help from another person toileting, which includes using toliet, bedpan, or urinal?: A Lot Help from another person bathing (including washing, rinsing, drying)?: A Lot Help from another person to put  on and taking off regular upper body clothing?: A Little Help from another person to put on and taking off regular lower body clothing?: A Lot 6 Click Score: 15   End of Session Equipment Utilized During Treatment: Rolling walker (2 wheels) Nurse Communication: Mobility status  Activity Tolerance: Patient limited by fatigue Patient left: in chair;with call bell/phone within reach;with chair alarm set  OT Visit Diagnosis: Unsteadiness on feet (R26.81);Other abnormalities of gait and mobility (R26.89);History of falling (Z91.81);Muscle weakness (generalized) (M62.81)                Time: 6440-3474 OT Time Calculation (min): 29 min Charges:  OT General Charges $OT Visit: 1 Visit OT Evaluation $OT Eval Low Complexity: 1 Low  Dannell Gortney OTR/L, MS Acute Rehabilitation Department Office# [phone]   Selinda Flavin 08/12/2023, 1:03 PM

## 2023-08-12 NOTE — Assessment & Plan Note (Signed)
-   pacer in place; s/p interrogation in ER

## 2023-08-12 NOTE — Progress Notes (Signed)
Progress Note    Amanda Stewart   UYQ:[id number]  DOB: Jan 12, 1941  DOA: 08/11/2023     0 PCP: Fleet Contras, MD  Initial CC: orthostasis, falls at home   Hospital Course: Amanda Stewart is an 82 y.o. female with PMH CAD, CHB s/p PPM (2022), PAD, CKD3a, DMII, HTN, HLD who presented to the hospital with recurrent falls at home, weakness, lightheadedness, and recent drop in blood pressure with standing. Of note, she tested positive for COVID-19 at an urgent care on 07/12/23 and completed outpatient course of anti-viral treatment. Ever since, she has had ongoing difficulty with regaining her strength back and has developed "brain fog" as well as some recent lightheadedness and dizziness when standing.  They contacted cardiology office and were told to hold amlodipine and hydralazine just prior to admission, however due to ongoing symptoms, she was brought to the ER for further evaluation.  Orthostatics were attempted on admission but she was unable to complete the standing for 3 minutes due to becoming symptomatic and lightheaded.  However, orthostatics were positive with systolic drop from lying to standing (131 >> 110).   She was admitted for further evaluation and monitoring in setting of orthostasis and weakness.   Interval History:  Seen this morning with daughters present bedside.  Main concern was her ongoing dizziness and falls at home. Denies any chest pain, true syncope, or dyspnea.  Mostly has felt generally weak and gets dizzy when standing.  Assessment and Plan: * Orthostatic hypotension - Dizziness and lightheadedness at home with systolic blood pressure drop -On admission, orthostatics obtained: 131/61 >> 127/67 >> 110/80 - repeated today (and are negative) s/p IVF and holding all BP meds except for coreg  - need to get cardiology input on if orthostasis could be related to echo findings  Physical deconditioning - Patient has been having multiple falls at home recently with some  associated dizziness and lightheadedness.  Patient and family deny that she has ever passed out though. 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, but I do not appreciate any focal deficit at this time; if any increase in suspicion will obtain further workup  -PT/OT evals ordered, appreciate workup.  Discussed with daughters and they still are wishing for returning home with home health.  They have multiple DME and will be staying with patient  History of COVID-19 - diagnosis 07/12/23 but she seems to be reporting some symptoms concerning for lingering covid symptoms such as the weakness and concentration difficulty "brain fog" - discussed with patient and daughters bedside; supportive care for now  PAD (peripheral artery disease) (HCC) - on asa, statin, pletal  Hyperlipidemia associated with type 2 diabetes mellitus (HCC) - continue lipitor   Hypertension associated with diabetes (HCC) - due to orthostasis, meds on hold - coreg continued for now  Chronic kidney disease, stage 3a (HCC) - patient has history of CKD3a. Baseline creat ~ 1.1, eGFR~ 50   Complete heart block (HCC) - pacer in place; s/p interrogation in ER  Diabetes mellitus, type 2 (HCC) - On Lantus, Amaryl, Jardiance at home -Continue SSI and CBG monitoring  CAD (coronary artery disease) - On aspirin, Lipitor, Coreg, Jardiance, losartan   Old records reviewed in assessment of this patient  Antimicrobials:   DVT prophylaxis:  enoxaparin (LOVENOX) injection 40 mg Start: 08/11/23  2200   Code Status:   Code Status: Full Code  Mobility Assessment (Last 72 Hours)     Mobility Assessment     Row Name 08/12/23 1301 08/12/23 0815 08/11/23 2133       Does patient have an order for bedrest or is patient medically unstable -- No -  Continue assessment No - Continue assessment     What is the highest level of mobility based on the progressive mobility assessment? Level 3 (Stands with assist) - Balance while standing  and cannot march in place Level 4 (Walks with assist in room) - Balance while marching in place and cannot step forward and back - Complete Level 4 (Walks with assist in room) - Balance while marching in place and cannot step forward and back - Complete              Barriers to discharge: none Disposition Plan:  Home with HH Status is: Obs  Objective: Blood pressure 126/68, pulse 79, temperature 98.8 F (37.1 C), temperature source Oral, resp. rate 18, height 5\' 5"  (1.651 m), weight 78.3 kg, SpO2 95%.  Examination:  Physical Exam Constitutional:      Appearance: Normal appearance.     Comments: Slightly hard of hearing  HENT:     Head: Normocephalic and atraumatic.     Mouth/Throat:     Mouth: Mucous membranes are moist.  Eyes:     Extraocular Movements: Extraocular movements intact.  Cardiovascular:     Rate and Rhythm: Normal rate and regular rhythm.     Heart sounds: Murmur (3/6 HSM noted best in USBs) heard.  Pulmonary:     Effort: Pulmonary effort is normal. No respiratory distress.     Breath sounds: Normal breath sounds. No wheezing.  Abdominal:     General: Bowel sounds are normal. There is no distension.     Palpations: Abdomen is soft.     Tenderness: There is no abdominal tenderness.  Musculoskeletal:        General: Normal range of motion.     Cervical back: Normal range of motion and neck supple.  Skin:    General: Skin is warm and dry.  Neurological:     General: No focal deficit present.     Mental Status: She is alert.  Psychiatric:        Mood and Affect: Mood normal.      Consultants:  Cardiology  Procedures:    Data Reviewed: Results for orders placed or performed during the hospital encounter of 08/11/23 (from the past 24 hour(s))  Basic metabolic panel      Status: Abnormal   Collection Time: 08/11/23  4:30 PM  Result Value Ref Range   Sodium 134 (L) 135 - 145 mmol/L   Potassium 4.2 3.5 - 5.1 mmol/L   Chloride 102 98 - 111 mmol/L   CO2 24 22 - 32 mmol/L   Glucose, Bld 256 (H) 70 - 99 mg/dL   BUN 20 8 - 23 mg/dL   Creatinine, Ser 6.96 (H) 0.44 - 1.00 mg/dL   Calcium 9.1 8.9 - 29.5 mg/dL   GFR, Estimated 44 (L) >60 mL/min   Anion gap 8 5 - 15  CBC with Differential     Status: Abnormal   Collection Time: 08/11/23  4:30 PM  Result Value Ref Range   WBC 5.3 4.0 - 10.5 K/uL   RBC 3.51 (L) 3.87 - 5.11 MIL/uL   Hemoglobin 11.1 (L) 12.0 - 15.0 g/dL   HCT  34.8 (L) 36.0 - 46.0 %   MCV 99.1 80.0 - 100.0 fL   MCH 31.6 26.0 - 34.0 pg   MCHC 31.9 30.0 - 36.0 g/dL   RDW 46.9 62.9 - 52.8 %   Platelets 184 150 - 400 K/uL   nRBC 0.0 0.0 - 0.2 %   Neutrophils Relative % 60 %   Neutro Abs 3.2 1.7 - 7.7 K/uL   Lymphocytes Relative 27 %   Lymphs Abs 1.5 0.7 - 4.0 K/uL   Monocytes Relative 10 %   Monocytes Absolute 0.5 0.1 - 1.0 K/uL   Eosinophils Relative 1 %   Eosinophils Absolute 0.1 0.0 - 0.5 K/uL   Basophils Relative 1 %   Basophils Absolute 0.0 0.0 - 0.1 K/uL   Immature Granulocytes 1 %   Abs Immature Granulocytes 0.03 0.00 - 0.07 K/uL  Troponin I (High Sensitivity)     Status: None   Collection Time: 08/11/23  4:30 PM  Result Value Ref Range   Troponin I (High Sensitivity) 15 <18 ng/L  Magnesium     Status: None   Collection Time: 08/11/23  4:30 PM  Result Value Ref Range   Magnesium 2.1 1.7 - 2.4 mg/dL  SARS Coronavirus 2 by RT PCR (hospital order, performed in [facility] hospital lab) *cepheid single result test* Anterior Nasal Swab     Status: Abnormal   Collection Time: 08/11/23  4:53 PM   Specimen: Anterior Nasal Swab  Result Value Ref Range   SARS Coronavirus 2 by RT PCR POSITIVE (A) NEGATIVE  Troponin I (High Sensitivity)     Status: None   Collection Time: 08/11/23  6:00 PM  Result Value Ref Range   Troponin I (High  Sensitivity) 16 <18 ng/L  Urinalysis, Routine w reflex microscopic -Urine, Clean Catch     Status: Abnormal   Collection Time: 08/11/23  7:29 PM  Result Value Ref Range   Color, Urine YELLOW YELLOW   APPearance HAZY (A) CLEAR   Specific Gravity, Urine 1.013 1.005 - 1.030   pH 6.0 5.0 - 8.0   Glucose, UA 150 (A) NEGATIVE mg/dL   Hgb urine dipstick NEGATIVE NEGATIVE   Bilirubin Urine NEGATIVE NEGATIVE   Ketones, ur 5 (A) NEGATIVE mg/dL   Protein, ur NEGATIVE NEGATIVE mg/dL   Nitrite POSITIVE (A) NEGATIVE   Leukocytes,Ua NEGATIVE NEGATIVE   RBC / HPF 0-5 0 - 5 RBC/hpf   WBC, UA 6-10 0 - 5 WBC/hpf   Bacteria, UA RARE (A) NONE SEEN   Squamous Epithelial / HPF 0-5 0 - 5 /HPF   Mucus PRESENT   Glucose, capillary     Status: Abnormal   Collection Time: 08/11/23  9:40 PM  Result Value Ref Range   Glucose-Capillary 205 (H) 70 - 99 mg/dL  CBC     Status: Abnormal   Collection Time: 08/12/23  5:31 AM  Result Value Ref Range   WBC 4.9 4.0 - 10.5 K/uL   RBC 3.21 (L) 3.87 - 5.11 MIL/uL   Hemoglobin 10.3 (L) 12.0 - 15.0 g/dL   HCT 41.3 (L) 24.4 - 01.0 %   MCV 99.7 80.0 - 100.0 fL   MCH 32.1 26.0 - 34.0 pg   MCHC 32.2 30.0 - 36.0 g/dL   RDW 27.2 53.6 - 64.4 %   Platelets 171 150 - 400 K/uL   nRBC 0.0 0.0 - 0.2 %  Basic metabolic panel     Status: Abnormal   Collection Time: 08/12/23  5:31 AM  Result Value Ref Range   Sodium 135 135 - 145 mmol/L   Potassium 4.0 3.5 - 5.1 mmol/L   Chloride 101 98 - 111 mmol/L   CO2 27 22 - 32 mmol/L   Glucose, Bld 181 (H) 70 - 99 mg/dL   BUN 14 8 - 23 mg/dL   Creatinine, Ser 4.13 0.44 - 1.00 mg/dL   Calcium 8.8 (L) 8.9 - 10.3 mg/dL   GFR, Estimated 59 (L) >60 mL/min   Anion gap 7 5 - 15  Glucose, capillary     Status: Abnormal   Collection Time: 08/12/23  7:27 AM  Result Value Ref Range   Glucose-Capillary 182 (H) 70 - 99 mg/dL  Glucose, capillary     Status: Abnormal   Collection Time: 08/12/23 12:09 PM  Result Value Ref Range    Glucose-Capillary 287 (H) 70 - 99 mg/dL    I have reviewed pertinent nursing notes, vitals, labs, and images as necessary. I have ordered labwork to follow up on as indicated.  I have reviewed the last notes from staff over past 24 hours. I have discussed patient's care plan and test results with nursing staff, CM/SW, and other staff as appropriate.  Time spent: Greater than 50% of the 55 minute visit was spent in counseling/coordination of care for the patient as laid out in the A&P.   LOS: 0 days   Lewie Chamber, MD [facility] 08/12/2023, 1:24 PM

## 2023-08-12 NOTE — Progress Notes (Signed)
  Echocardiogram 2D Echocardiogram has been performed.  Milda Smart 08/12/2023, 12:16 PM

## 2023-08-13 ENCOUNTER — Ambulatory Visit (HOSPITAL_BASED_OUTPATIENT_CLINIC_OR_DEPARTMENT_OTHER): Payer: 59 | Admitting: Cardiology

## 2023-08-13 ENCOUNTER — Telehealth: Payer: Self-pay | Admitting: Cardiology

## 2023-08-13 DIAGNOSIS — I951 Orthostatic hypotension: Secondary | ICD-10-CM | POA: Diagnosis not present

## 2023-08-13 DIAGNOSIS — I442 Atrioventricular block, complete: Secondary | ICD-10-CM | POA: Diagnosis not present

## 2023-08-13 DIAGNOSIS — R5381 Other malaise: Secondary | ICD-10-CM | POA: Diagnosis not present

## 2023-08-13 DIAGNOSIS — Z8616 Personal history of COVID-19: Secondary | ICD-10-CM | POA: Diagnosis not present

## 2023-08-13 LAB — HEMOGLOBIN A1C
Hgb A1c MFr Bld: 10.1 % — ABNORMAL HIGH (ref 4.8–5.6)
Mean Plasma Glucose: 243 mg/dL

## 2023-08-13 LAB — GLUCOSE, CAPILLARY
Glucose-Capillary: 218 mg/dL — ABNORMAL HIGH (ref 70–99)
Glucose-Capillary: 257 mg/dL — ABNORMAL HIGH (ref 70–99)

## 2023-08-13 MED ORDER — INSULIN GLARGINE-YFGN 100 UNIT/ML ~~LOC~~ SOLN
5.0000 [IU] | Freq: Every day | SUBCUTANEOUS | Status: DC
  Administered 2023-08-13: 5 [IU] via SUBCUTANEOUS
  Filled 2023-08-13: qty 0.05

## 2023-08-13 MED ORDER — METOPROLOL TARTRATE 25 MG PO TABS: 25.0000 mg | ORAL_TABLET | Freq: Two times a day (BID) | ORAL | 3 refills | Status: DC

## 2023-08-13 MED ORDER — METOPROLOL TARTRATE 25 MG PO TABS: 25.0000 mg | ORAL_TABLET | Freq: Two times a day (BID) | ORAL | Status: DC

## 2023-08-13 MED ORDER — INFLUENZA VAC A&B SURF ANT ADJ 0.5 ML IM SUSY
0.5000 mL | PREFILLED_SYRINGE | INTRAMUSCULAR | Status: AC
  Administered 2023-08-13: 0.5 mL via INTRAMUSCULAR
  Filled 2023-08-13: qty 0.5

## 2023-08-13 NOTE — Discharge Summary (Signed)
Physician Discharge Summary   Amanda Stewart ZOX:[id number] DOB: [birth year] DOA: 08/11/2023  PCP: Fleet Contras, MD  Admit date: 08/11/2023 Discharge date: 08/13/2023   Admitted From: Home Disposition:  Home Discharging physician: Lewie Chamber, MD Barriers to discharge: none  Recommendations at discharge: Follow up with cardiology as planned Adjust BP regimen as necessary  Home Health: PT, OT  Discharge Condition: stable CODE STATUS: Full Diet recommendation:  Diet Orders (From admission, onward)     Start     Ordered   08/13/23 0000  Diet - low sodium heart healthy        08/13/23 1326   08/13/23 0000  Diet Carb Modified        08/13/23 1326   08/11/23 1930  Diet heart healthy/carb modified Room service appropriate? Yes; Fluid consistency: Thin  Diet effective now       Question Answer Comment  Diet-HS Snack? Nothing   Room service appropriate? Yes   Fluid consistency: Thin      08/11/23 1931            Hospital Course: Amanda Stewart is an 82 y.o. female with PMH CAD, CHB s/p PPM (2022), PAD, CKD3a, DMII, HTN, HLD who presented to the hospital with recurrent falls at home, weakness, lightheadedness, and recent drop in blood pressure with standing. Of note, she tested positive for COVID-19 at an urgent care on 07/12/23 and completed outpatient course of anti-viral treatment. Ever since, she has had ongoing difficulty with regaining her strength back and has developed "brain fog" as well as some recent lightheadedness and dizziness when standing.  They contacted cardiology office and were told to hold amlodipine and hydralazine just prior to admission, however due to ongoing symptoms, she was brought to the ER for further evaluation.  Orthostatics were attempted on admission but she was unable to complete the standing for 3 minutes due to becoming symptomatic and lightheaded.  However, orthostatics were positive with systolic drop from lying to standing (131 >> 110).   She  was admitted for further evaluation and monitoring in setting of orthostasis and weakness.  Echo also repeated/updated and cardiology evaluation requested as well. BP regimen was adjusted (lopressor substituted for coreg) and remainder home regimen to be resumed slowly 1 at a time as BP can tolerate (explained out in AVS).  No further orthostasis on repeat orthostatic check.  She plans to follow-up outpatient with cardiology as well. She was also evaluated by PT/OT and family elected for bringing patient home as she has 24/7 care and they will continue with [facility].   Assessment and Plan: * Orthostatic hypotension-resolved as of 08/13/2023 - Dizziness and lightheadedness at home with systolic blood pressure drop -On admission, orthostatics obtained: 131/61 >> 127/67 >> 110/80 - repeated 9/15 (and are negative) s/p IVF and holding all BP meds except for coreg on admission - appreciate cardiology evaluation s/p echo; coreg changed to lopressor - home meds addressed in AVS on how to resume (lopressor then losartan then amlodipine then hydralazine; resuming 1 at a time); further modification deferred to cardiology at follow up   Physical deconditioning - Patient has been having multiple falls at home recently with some associated dizziness and lightheadedness.  Patient and family deny that she has ever passed out though. 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, but  Physician Discharge Summary   Amanda Stewart ZOX:[id number] DOB: [birth year] DOA: 08/11/2023  PCP: Fleet Contras, MD  Admit date: 08/11/2023 Discharge date: 08/13/2023   Admitted From: Home Disposition:  Home Discharging physician: Lewie Chamber, MD Barriers to discharge: none  Recommendations at discharge: Follow up with cardiology as planned Adjust BP regimen as necessary  Home Health: PT, OT  Discharge Condition: stable CODE STATUS: Full Diet recommendation:  Diet Orders (From admission, onward)     Start     Ordered   08/13/23 0000  Diet - low sodium heart healthy        08/13/23 1326   08/13/23 0000  Diet Carb Modified        08/13/23 1326   08/11/23 1930  Diet heart healthy/carb modified Room service appropriate? Yes; Fluid consistency: Thin  Diet effective now       Question Answer Comment  Diet-HS Snack? Nothing   Room service appropriate? Yes   Fluid consistency: Thin      08/11/23 1931            Hospital Course: Amanda Stewart is an 82 y.o. female with PMH CAD, CHB s/p PPM (2022), PAD, CKD3a, DMII, HTN, HLD who presented to the hospital with recurrent falls at home, weakness, lightheadedness, and recent drop in blood pressure with standing. Of note, she tested positive for COVID-19 at an urgent care on 07/12/23 and completed outpatient course of anti-viral treatment. Ever since, she has had ongoing difficulty with regaining her strength back and has developed "brain fog" as well as some recent lightheadedness and dizziness when standing.  They contacted cardiology office and were told to hold amlodipine and hydralazine just prior to admission, however due to ongoing symptoms, she was brought to the ER for further evaluation.  Orthostatics were attempted on admission but she was unable to complete the standing for 3 minutes due to becoming symptomatic and lightheaded.  However, orthostatics were positive with systolic drop from lying to standing (131 >> 110).   She  was admitted for further evaluation and monitoring in setting of orthostasis and weakness.  Echo also repeated/updated and cardiology evaluation requested as well. BP regimen was adjusted (lopressor substituted for coreg) and remainder home regimen to be resumed slowly 1 at a time as BP can tolerate (explained out in AVS).  No further orthostasis on repeat orthostatic check.  She plans to follow-up outpatient with cardiology as well. She was also evaluated by PT/OT and family elected for bringing patient home as she has 24/7 care and they will continue with [facility].   Assessment and Plan: * Orthostatic hypotension-resolved as of 08/13/2023 - Dizziness and lightheadedness at home with systolic blood pressure drop -On admission, orthostatics obtained: 131/61 >> 127/67 >> 110/80 - repeated 9/15 (and are negative) s/p IVF and holding all BP meds except for coreg on admission - appreciate cardiology evaluation s/p echo; coreg changed to lopressor - home meds addressed in AVS on how to resume (lopressor then losartan then amlodipine then hydralazine; resuming 1 at a time); further modification deferred to cardiology at follow up   Physical deconditioning - Patient has been having multiple falls at home recently with some associated dizziness and lightheadedness.  Patient and family deny that she has ever passed out though. 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, but  0531  WBC 5.3 4.9   Microbiology Recent Results (from the past 240 hour(s))  SARS Coronavirus 2 by RT PCR (hospital order, performed in [facility] hospital lab) *cepheid single result test* Anterior Nasal  Swab     Status: Abnormal   Collection Time: 08/11/23  4:53 PM   Specimen: Anterior Nasal Swab  Result Value Ref Range Status   SARS Coronavirus 2 by RT PCR POSITIVE (A) NEGATIVE Final    Comment: (NOTE) SARS-CoV-2 target nucleic acids are DETECTED  SARS-CoV-2 RNA is generally detectable in upper respiratory specimens  during the acute phase of infection.  Positive results are indicative  of the presence of the identified virus, but do not rule out bacterial infection or co-infection with other pathogens not detected by the test.  Clinical correlation with patient history and  other diagnostic information is necessary to determine patient infection status.  The expected result is negative.  Fact Sheet for Patients:   RoadLapTop.co.za   Fact Sheet for Healthcare Providers:   http://kim-miller.com/    This test is not yet approved or cleared by the [place] FDA and  has been authorized for detection and/or diagnosis of SARS-CoV-2 by FDA under an Emergency Use Authorization (EUA).  This EUA will remain in effect (meaning this test can be used) for the duration of  the COVID-19 declaration under Section 564(b)(1)  of the Act, 21 U.S.C. section 360-bbb-3(b)(1), unless the authorization is terminated or revoked sooner.   Performed at [facility], [address], [place], [place] [address]     Procedures/Studies: ECHOCARDIOGRAM COMPLETE  Result Date: 08/12/2023    ECHOCARDIOGRAM REPORT   Patient Name:   Ronella J Coudriet Date of Exam: 08/12/2023 Medical Rec #:  [id number]   Height:       65.0 in Accession #:    [id number]  Weight:       172.6 lb Date of Birth:  June 26, 1941   BSA:          1.858 m Patient Age:    82 years    BP:           126/68 mmHg Patient Gender: F           HR:           82 bpm. Exam Location:  Inpatient Procedure: 2D Echo, Cardiac Doppler, Color Doppler and Intracardiac            Opacification Agent  Indications:    Murmur  History:        Patient has prior history of Echocardiogram examinations, most                 recent 08/07/2021. CHF, Previous Myocardial Infarction and CAD,                 Pacemaker, PAD, Arrythmias:LBBB and CHB; Risk                 Factors:Hypertension, Dyslipidemia and Diabetes. CKD.  Sonographer:    Milda Smart Referring Phys: [id number] VISHAL R PATEL  Sonographer Comments: Image acquisition challenging due to patient body habitus and Image acquisition challenging due to respiratory motion. IMPRESSIONS  1. Left ventricular ejection fraction, by estimation, is 65 to 70%. The left ventricle has normal function. The left ventricle has no regional wall motion abnormalities. 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 Elevated left ventricular end-diastolic pressure.  2. Right ventricular systolic function is normal. The right ventricular size is  0531  WBC 5.3 4.9   Microbiology Recent Results (from the past 240 hour(s))  SARS Coronavirus 2 by RT PCR (hospital order, performed in [facility] hospital lab) *cepheid single result test* Anterior Nasal  Swab     Status: Abnormal   Collection Time: 08/11/23  4:53 PM   Specimen: Anterior Nasal Swab  Result Value Ref Range Status   SARS Coronavirus 2 by RT PCR POSITIVE (A) NEGATIVE Final    Comment: (NOTE) SARS-CoV-2 target nucleic acids are DETECTED  SARS-CoV-2 RNA is generally detectable in upper respiratory specimens  during the acute phase of infection.  Positive results are indicative  of the presence of the identified virus, but do not rule out bacterial infection or co-infection with other pathogens not detected by the test.  Clinical correlation with patient history and  other diagnostic information is necessary to determine patient infection status.  The expected result is negative.  Fact Sheet for Patients:   RoadLapTop.co.za   Fact Sheet for Healthcare Providers:   http://kim-miller.com/    This test is not yet approved or cleared by the [place] FDA and  has been authorized for detection and/or diagnosis of SARS-CoV-2 by FDA under an Emergency Use Authorization (EUA).  This EUA will remain in effect (meaning this test can be used) for the duration of  the COVID-19 declaration under Section 564(b)(1)  of the Act, 21 U.S.C. section 360-bbb-3(b)(1), unless the authorization is terminated or revoked sooner.   Performed at [facility], [address], [place], [place] [address]     Procedures/Studies: ECHOCARDIOGRAM COMPLETE  Result Date: 08/12/2023    ECHOCARDIOGRAM REPORT   Patient Name:   Ronella J Coudriet Date of Exam: 08/12/2023 Medical Rec #:  [id number]   Height:       65.0 in Accession #:    [id number]  Weight:       172.6 lb Date of Birth:  June 26, 1941   BSA:          1.858 m Patient Age:    82 years    BP:           126/68 mmHg Patient Gender: F           HR:           82 bpm. Exam Location:  Inpatient Procedure: 2D Echo, Cardiac Doppler, Color Doppler and Intracardiac            Opacification Agent  Indications:    Murmur  History:        Patient has prior history of Echocardiogram examinations, most                 recent 08/07/2021. CHF, Previous Myocardial Infarction and CAD,                 Pacemaker, PAD, Arrythmias:LBBB and CHB; Risk                 Factors:Hypertension, Dyslipidemia and Diabetes. CKD.  Sonographer:    Milda Smart Referring Phys: [id number] VISHAL R PATEL  Sonographer Comments: Image acquisition challenging due to patient body habitus and Image acquisition challenging due to respiratory motion. IMPRESSIONS  1. Left ventricular ejection fraction, by estimation, is 65 to 70%. The left ventricle has normal function. The left ventricle has no regional wall motion abnormalities. 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 Elevated left ventricular end-diastolic pressure.  2. Right ventricular systolic function is normal. The right ventricular size is  0531  WBC 5.3 4.9   Microbiology Recent Results (from the past 240 hour(s))  SARS Coronavirus 2 by RT PCR (hospital order, performed in [facility] hospital lab) *cepheid single result test* Anterior Nasal  Swab     Status: Abnormal   Collection Time: 08/11/23  4:53 PM   Specimen: Anterior Nasal Swab  Result Value Ref Range Status   SARS Coronavirus 2 by RT PCR POSITIVE (A) NEGATIVE Final    Comment: (NOTE) SARS-CoV-2 target nucleic acids are DETECTED  SARS-CoV-2 RNA is generally detectable in upper respiratory specimens  during the acute phase of infection.  Positive results are indicative  of the presence of the identified virus, but do not rule out bacterial infection or co-infection with other pathogens not detected by the test.  Clinical correlation with patient history and  other diagnostic information is necessary to determine patient infection status.  The expected result is negative.  Fact Sheet for Patients:   RoadLapTop.co.za   Fact Sheet for Healthcare Providers:   http://kim-miller.com/    This test is not yet approved or cleared by the [place] FDA and  has been authorized for detection and/or diagnosis of SARS-CoV-2 by FDA under an Emergency Use Authorization (EUA).  This EUA will remain in effect (meaning this test can be used) for the duration of  the COVID-19 declaration under Section 564(b)(1)  of the Act, 21 U.S.C. section 360-bbb-3(b)(1), unless the authorization is terminated or revoked sooner.   Performed at [facility], [address], [place], [place] [address]     Procedures/Studies: ECHOCARDIOGRAM COMPLETE  Result Date: 08/12/2023    ECHOCARDIOGRAM REPORT   Patient Name:   Ronella J Coudriet Date of Exam: 08/12/2023 Medical Rec #:  [id number]   Height:       65.0 in Accession #:    [id number]  Weight:       172.6 lb Date of Birth:  June 26, 1941   BSA:          1.858 m Patient Age:    82 years    BP:           126/68 mmHg Patient Gender: F           HR:           82 bpm. Exam Location:  Inpatient Procedure: 2D Echo, Cardiac Doppler, Color Doppler and Intracardiac            Opacification Agent  Indications:    Murmur  History:        Patient has prior history of Echocardiogram examinations, most                 recent 08/07/2021. CHF, Previous Myocardial Infarction and CAD,                 Pacemaker, PAD, Arrythmias:LBBB and CHB; Risk                 Factors:Hypertension, Dyslipidemia and Diabetes. CKD.  Sonographer:    Milda Smart Referring Phys: [id number] VISHAL R PATEL  Sonographer Comments: Image acquisition challenging due to patient body habitus and Image acquisition challenging due to respiratory motion. IMPRESSIONS  1. Left ventricular ejection fraction, by estimation, is 65 to 70%. The left ventricle has normal function. The left ventricle has no regional wall motion abnormalities. 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 Elevated left ventricular end-diastolic pressure.  2. Right ventricular systolic function is normal. The right ventricular size is  Physician Discharge Summary   Amanda Stewart ZOX:[id number] DOB: [birth year] DOA: 08/11/2023  PCP: Fleet Contras, MD  Admit date: 08/11/2023 Discharge date: 08/13/2023   Admitted From: Home Disposition:  Home Discharging physician: Lewie Chamber, MD Barriers to discharge: none  Recommendations at discharge: Follow up with cardiology as planned Adjust BP regimen as necessary  Home Health: PT, OT  Discharge Condition: stable CODE STATUS: Full Diet recommendation:  Diet Orders (From admission, onward)     Start     Ordered   08/13/23 0000  Diet - low sodium heart healthy        08/13/23 1326   08/13/23 0000  Diet Carb Modified        08/13/23 1326   08/11/23 1930  Diet heart healthy/carb modified Room service appropriate? Yes; Fluid consistency: Thin  Diet effective now       Question Answer Comment  Diet-HS Snack? Nothing   Room service appropriate? Yes   Fluid consistency: Thin      08/11/23 1931            Hospital Course: Amanda Stewart is an 82 y.o. female with PMH CAD, CHB s/p PPM (2022), PAD, CKD3a, DMII, HTN, HLD who presented to the hospital with recurrent falls at home, weakness, lightheadedness, and recent drop in blood pressure with standing. Of note, she tested positive for COVID-19 at an urgent care on 07/12/23 and completed outpatient course of anti-viral treatment. Ever since, she has had ongoing difficulty with regaining her strength back and has developed "brain fog" as well as some recent lightheadedness and dizziness when standing.  They contacted cardiology office and were told to hold amlodipine and hydralazine just prior to admission, however due to ongoing symptoms, she was brought to the ER for further evaluation.  Orthostatics were attempted on admission but she was unable to complete the standing for 3 minutes due to becoming symptomatic and lightheaded.  However, orthostatics were positive with systolic drop from lying to standing (131 >> 110).   She  was admitted for further evaluation and monitoring in setting of orthostasis and weakness.  Echo also repeated/updated and cardiology evaluation requested as well. BP regimen was adjusted (lopressor substituted for coreg) and remainder home regimen to be resumed slowly 1 at a time as BP can tolerate (explained out in AVS).  No further orthostasis on repeat orthostatic check.  She plans to follow-up outpatient with cardiology as well. She was also evaluated by PT/OT and family elected for bringing patient home as she has 24/7 care and they will continue with [facility].   Assessment and Plan: * Orthostatic hypotension-resolved as of 08/13/2023 - Dizziness and lightheadedness at home with systolic blood pressure drop -On admission, orthostatics obtained: 131/61 >> 127/67 >> 110/80 - repeated 9/15 (and are negative) s/p IVF and holding all BP meds except for coreg on admission - appreciate cardiology evaluation s/p echo; coreg changed to lopressor - home meds addressed in AVS on how to resume (lopressor then losartan then amlodipine then hydralazine; resuming 1 at a time); further modification deferred to cardiology at follow up   Physical deconditioning - Patient has been having multiple falls at home recently with some associated dizziness and lightheadedness.  Patient and family deny that she has ever passed out though. 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, but  Physician Discharge Summary   Amanda Stewart ZOX:[id number] DOB: [birth year] DOA: 08/11/2023  PCP: Fleet Contras, MD  Admit date: 08/11/2023 Discharge date: 08/13/2023   Admitted From: Home Disposition:  Home Discharging physician: Lewie Chamber, MD Barriers to discharge: none  Recommendations at discharge: Follow up with cardiology as planned Adjust BP regimen as necessary  Home Health: PT, OT  Discharge Condition: stable CODE STATUS: Full Diet recommendation:  Diet Orders (From admission, onward)     Start     Ordered   08/13/23 0000  Diet - low sodium heart healthy        08/13/23 1326   08/13/23 0000  Diet Carb Modified        08/13/23 1326   08/11/23 1930  Diet heart healthy/carb modified Room service appropriate? Yes; Fluid consistency: Thin  Diet effective now       Question Answer Comment  Diet-HS Snack? Nothing   Room service appropriate? Yes   Fluid consistency: Thin      08/11/23 1931            Hospital Course: Amanda Stewart is an 82 y.o. female with PMH CAD, CHB s/p PPM (2022), PAD, CKD3a, DMII, HTN, HLD who presented to the hospital with recurrent falls at home, weakness, lightheadedness, and recent drop in blood pressure with standing. Of note, she tested positive for COVID-19 at an urgent care on 07/12/23 and completed outpatient course of anti-viral treatment. Ever since, she has had ongoing difficulty with regaining her strength back and has developed "brain fog" as well as some recent lightheadedness and dizziness when standing.  They contacted cardiology office and were told to hold amlodipine and hydralazine just prior to admission, however due to ongoing symptoms, she was brought to the ER for further evaluation.  Orthostatics were attempted on admission but she was unable to complete the standing for 3 minutes due to becoming symptomatic and lightheaded.  However, orthostatics were positive with systolic drop from lying to standing (131 >> 110).   She  was admitted for further evaluation and monitoring in setting of orthostasis and weakness.  Echo also repeated/updated and cardiology evaluation requested as well. BP regimen was adjusted (lopressor substituted for coreg) and remainder home regimen to be resumed slowly 1 at a time as BP can tolerate (explained out in AVS).  No further orthostasis on repeat orthostatic check.  She plans to follow-up outpatient with cardiology as well. She was also evaluated by PT/OT and family elected for bringing patient home as she has 24/7 care and they will continue with [facility].   Assessment and Plan: * Orthostatic hypotension-resolved as of 08/13/2023 - Dizziness and lightheadedness at home with systolic blood pressure drop -On admission, orthostatics obtained: 131/61 >> 127/67 >> 110/80 - repeated 9/15 (and are negative) s/p IVF and holding all BP meds except for coreg on admission - appreciate cardiology evaluation s/p echo; coreg changed to lopressor - home meds addressed in AVS on how to resume (lopressor then losartan then amlodipine then hydralazine; resuming 1 at a time); further modification deferred to cardiology at follow up   Physical deconditioning - Patient has been having multiple falls at home recently with some associated dizziness and lightheadedness.  Patient and family deny that she has ever passed out though. 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, but

## 2023-08-13 NOTE — Discharge Instructions (Signed)
Try to target a blood pressure around 130-140 / 80-90.  1) You can hold the hydralazine for now and resume this last if needed.  2) Also, try cutting the losartan in half first and resuming this first along with the new medicine metoprolol (Lopressor). Give a whole Losartan if pressure still not at goal range.  3) Next, if still needed for more pressure control add back the amlodipine but cutting in half first also, then increasing to full tablet if still necessary. 4) Lastly, add back the hydralazine if still needed once all the above have been resumed.

## 2023-08-13 NOTE — Telephone Encounter (Signed)
Patient's daughter is wanting to confirm the patient should continue not taking the amlodipine and hydralazine that was stopped on 09/13 due to the patient being currently admitted.   The patient's scheduled appointment for today with Dr. Anne Fu had to be cancelled due to this.   She reports they have only been giving her carvedilol at the hospital.  Please advise.

## 2023-08-13 NOTE — Plan of Care (Signed)
Problem: Education: Goal: Ability to describe self-care measures that may prevent or decrease complications (Diabetes Survival Skills Education) will improve Outcome: Progressing Goal: Individualized Educational Video(s) Outcome: Progressing   Problem: Coping: Goal: Ability to adjust to condition or change in health will improve Outcome: Progressing   Problem: Fluid Volume: Goal: Ability to maintain a balanced intake and output will improve Outcome: Progressing   Problem: Health Behavior/Discharge Planning: Goal: Ability to identify and utilize available resources and services will improve Outcome: Progressing Goal: Ability to manage health-related needs will improve Outcome: Progressing   Problem: Metabolic: Goal: Ability to maintain appropriate glucose levels will improve Outcome: Progressing   Problem: Nutritional: Goal: Maintenance of adequate nutrition will improve Outcome: Progressing Goal: Progress toward achieving an optimal weight will improve Outcome: Progressing   Problem: Skin Integrity: Goal: Risk for impaired skin integrity will decrease Outcome: Progressing   Problem: Tissue Perfusion: Goal: Adequacy of tissue perfusion will improve Outcome: Progressing   Problem: Education: Goal: Knowledge of General Education information will improve Description: Including pain rating scale, medication(s)/side effects and non-pharmacologic comfort measures Outcome: Progressing   Problem: Health Behavior/Discharge Planning: Goal: Ability to manage health-related needs will improve Outcome: Progressing   Problem: Clinical Measurements: Goal: Ability to maintain clinical measurements within normal limits will improve Outcome: Progressing Goal: Will remain free from infection Outcome: Progressing Goal: Diagnostic test results will improve Outcome: Progressing Goal: Respiratory complications will improve Outcome: Progressing Goal: Cardiovascular complication will  be avoided Outcome: Progressing   Problem: Activity: Goal: Risk for activity intolerance will decrease Outcome: Progressing   Problem: Nutrition: Goal: Adequate nutrition will be maintained Outcome: Progressing   Problem: Elimination: Goal: Will not experience complications related to bowel motility Outcome: Progressing Goal: Will not experience complications related to urinary retention Outcome: Progressing   Problem: Safety: Goal: Ability to remain free from injury will improve Outcome: Progressing   Problem: Skin Integrity: Goal: Risk for impaired skin integrity will decrease Outcome: Progressing

## 2023-08-13 NOTE — Telephone Encounter (Signed)
Pt is currently being evaluated at [facility] for weakness, dizziness, frequent falls.  Amlodipine and Hydralazine being held d/t ortho stasis.  Will have Dr Anne Fu review.

## 2023-08-13 NOTE — Telephone Encounter (Signed)
Spoke with daughter who is aware Dr Anne Fu agrees with medication changes per hospital.  Follow up appt scheduled with Dr Anne Fu at [place] on Thursday.

## 2023-08-13 NOTE — TOC Transition Note (Signed)
Transition of Care [facility]) - CM/SW Discharge Note   Patient Details  Name: Amanda Stewart MRN: [id number] Date of Birth: May 23, 1941  [facility] CM/SW Contact:  Otelia Santee, LCSW Phone Number: 08/13/2023, 1:52 PM   Clinical Narrative:     Spoke with pt's daughter via t/c. Pt currently lives at home and receives home care through [facility]. Pt is not receiving home health services however, pts daughter is agreeable to PT/OT being arranged. She denies having DME needs as pt has a rollator, BSC, and shower chair at home.  HHPT/OT has been arrnged with [facility].   Final next level of care: Home w Home Health Services Barriers to Discharge: No Barriers Identified   Patient Goals and CMS Choice CMS Medicare.gov Compare Post Acute Care list provided to:: Patient Represenative (must comment) Choice offered to / list presented to : Adult Children  Discharge Placement                         Discharge Plan and Services Additional resources added to the After Visit Summary for                  DME Arranged: N/A DME Agency: NA       HH Arranged: PT, OT HH Agency: [facility] Date [facility] Agency Contacted: 08/13/23 Time HH Agency Contacted: [id number] Representative spoke with at [facility] Agency: Cindie  Social Determinants of Health (SDOH) Interventions SDOH Screenings   Food Insecurity: No Food Insecurity (08/12/2023)  Housing: Low Risk  (08/12/2023)  Transportation Needs: No Transportation Needs (08/12/2023)  Utilities: Not At Risk (08/12/2023)  Tobacco Use: Low Risk  (08/11/2023)     Readmission Risk Interventions     No data to display

## 2023-08-13 NOTE — Plan of Care (Signed)
Pt is progressing 

## 2023-08-13 NOTE — Consult Note (Signed)
CONSULTATION NOTE   Patient Name: Amanda Stewart Date of Encounter: 08/13/2023 Cardiologist: Donato Schultz, MD Electrophysiologist: Lanier Prude, MD Advanced Heart Failure: None   Chief Complaint   Weakness, lightheadedness  Patient Profile   82 yo female with CAD, CHB s/p PPM, PAD, CKD, HTN, HLD, presents with generalized weakness and lightheadedness  HPI   Amanda Stewart is a 82 y.o. female who is being seen today for the evaluation of weakness/ at the request of Dr. Frederick Stewart. This is a pleasant patient of Dr. Anne Stewart who presented for generalized weakness and lightheadedness. They had contacted the office and were advised to hold her amlodipine and hydralazine to help prevent orthostasis.  She had a recent COVID 19 infections on 8/15 (PCR positive on this admission).  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 She has a history of AS - repeat echo was performed yesterday which shows LVEF 65=70%, severe ASH, elevated LVEDP, normal RV function, mild to moderate AS - mean gradient 17.5 mmHg.   PMHx   Past Medical History:  Diagnosis Date   Allergic rhinitis    Anemia    Arrhythmia    CAD (coronary artery disease)    Chest pain    Chronic kidney disease, stage 3a (HCC) 08/11/2023   Combined systolic and diastolic heart failure (HCC)    Diabetes mellitus without complication (HCC)    Diabetic peripheral neuropathy (HCC)    GERD (gastroesophageal reflux disease)    Heart block    Hyperlipidemia    Hypertension    Left bundle branch block    Low back pain    Myocardial infarction (HCC)    Peripheral arterial disease (HCC)    Retinopathy     Past Surgical History:  Procedure Laterality Date   ABDOMINAL HYSTERECTOMY     LEFT AND RIGHT HEART CATHETERIZATION WITH CORONARY ANGIOGRAM N/A 05/26/2014   Procedure: LEFT AND RIGHT HEART CATHETERIZATION WITH CORONARY ANGIOGRAM;  Surgeon: [name], MD;  Location: [facility] CATH LAB;  Service: Cardiovascular;  Laterality: N/A;    PACEMAKER IMPLANT N/A 08/08/2021   Procedure: PACEMAKER IMPLANT;  Surgeon: Lanier Prude, MD;  Location: [facility] INVASIVE CV LAB;  Service: Cardiovascular;  Laterality: N/A;   TEMPORARY PACEMAKER N/A 08/07/2021   Procedure: TEMPORARY PACEMAKER;  Surgeon: Rinaldo Cloud, MD;  Location: [facility] INVASIVE CV LAB;  Service: Cardiovascular;  Laterality: N/A;    FAMHx   Family History  Problem Relation Age of Onset   Diabetes Mother    Diabetes Father    Stroke Father     SOCHx    reports that she has never smoked. She has never used smokeless tobacco. She reports current alcohol use of about 1.0 standard drink of alcohol per week. She reports that she does not use drugs.  Outpatient Medications   No current facility-administered medications on file prior to encounter.   Current Outpatient Medications on File Prior to Encounter  Medication Sig Dispense Refill   acetaminophen (TYLENOL) 325 MG tablet Take 1-2 tablets (325-650 mg total) by mouth every 4 (four) hours as needed for mild pain.     albuterol (VENTOLIN HFA) 108 (90 Base) MCG/ACT inhaler Inhale 2 puffs into the lungs every 6 (six) hours as needed for wheezing or shortness of breath.     aspirin EC 81 MG tablet Take 81 mg by mouth daily.     atorvastatin (LIPITOR) 40 MG tablet Take 40 mg by mouth daily.     benzonatate (TESSALON)  100 MG capsule Take 100-200 mg by mouth every 8 (eight) hours as needed.     carvedilol (COREG) 12.5 MG tablet Take 12.5 mg by mouth 2 (two) times daily with a meal.     cilostazol (PLETAL) 100 MG tablet Take 100 mg by mouth daily.     dorzolamide (TRUSOPT) 2 % ophthalmic solution Place 1 drop into both eyes 2 (two) times daily.     empagliflozin (JARDIANCE) 25 MG TABS tablet Take 25 mg by mouth daily.     glimepiride (AMARYL) 1 MG tablet Take 1 mg by mouth daily with breakfast.     hydrALAZINE (APRESOLINE) 50 MG tablet Take 50 mg by mouth 2 (two) times daily.     losartan (COZAAR) 100 MG tablet Take 1 tablet  (100 mg total) by mouth daily. 30 tablet 3   Multiple Vitamin (MULTIVITAMIN WITH MINERALS) TABS tablet Take 1 tablet by mouth daily.     nitroGLYCERIN (NITROSTAT) 0.4 MG SL tablet Place 1 tablet (0.4 mg total) under the tongue every 5 (five) minutes x 3 doses as needed for chest pain. 25 tablet 1   amLODipine (NORVASC) 10 MG tablet Take 10 mg by mouth daily. (Patient not taking: Reported on 08/11/2023)     insulin glargine (LANTUS) 100 UNIT/ML Solostar Pen Inject 5 Units into the skin at bedtime. (Patient not taking: Reported on 08/11/2023) 15 mL 1   pantoprazole (PROTONIX) 40 MG tablet Take 1 tablet (40 mg total) by mouth daily at 6 (six) AM. (Patient not taking: Reported on 08/11/2023) 30 tablet 3    Inpatient Medications    Scheduled Meds:  aspirin EC  81 mg Oral Daily   atorvastatin  40 mg Oral Daily   carvedilol  12.5 mg Oral BID   cilostazol  100 mg Oral Daily   dorzolamide  1 drop Both Eyes BID   enoxaparin (LOVENOX) injection  40 mg Subcutaneous Q24H   feeding supplement  237 mL Oral BID BM   insulin aspart  0-5 Units Subcutaneous QHS   insulin aspart  0-9 Units Subcutaneous TID WC   sodium chloride flush  3 mL Intravenous Q12H    Continuous Infusions:   PRN Meds: acetaminophen **OR** acetaminophen, albuterol, ondansetron **OR** ondansetron (ZOFRAN) IV, senna-docusate   ALLERGIES   Allergies  Allergen Reactions   Metformin And Related Other (See Comments)    Diarrhea     ROS   Pertinent items noted in HPI and remainder of comprehensive ROS otherwise negative.  Vitals   Vitals:   08/12/23 1211 08/12/23 2049 08/13/23 0500 08/13/23 0510  BP: 126/68 (!) 138/55  130/65  Pulse: 79 81  78  Resp: 18 16    Temp: 98.8 F (37.1 C) 98.5 F (36.9 C)  98.2 F (36.8 C)  TempSrc: Oral Oral  Oral  SpO2: 95% 97%  97%  Weight:   78.4 kg   Height:        Intake/Output Summary (Last 24 hours) at 08/13/2023 1037 Last data filed at 08/12/2023 1657 Gross per 24 hour  Intake  456 ml  Output 600 ml  Net -144 ml   Filed Weights   08/11/23 2109 08/12/23 0421 08/13/23 0500  Weight: 78.3 kg 78.3 kg 78.4 kg    Physical Exam   General appearance: alert, appears stated age, and no distress Neck: no carotid bruit, no JVD, and thyroid not enlarged, symmetric, no tenderness/mass/nodules Lungs: clear to auscultation bilaterally Heart: regular rate and rhythm and systolic murmur: systolic ejection 3/6,  crescendo at 2nd right intercostal space Abdomen: soft, non-tender; bowel sounds normal; no masses,  no organomegaly Extremities: extremities normal, atraumatic, no cyanosis or edema Pulses: 2+ and symmetric Skin: Skin color, texture, turgor normal. No rashes or lesions Neurologic: Grossly normal Psych: Pleasant  Labs   Results for orders placed or performed during the hospital encounter of 08/11/23 (from the past 48 hour(s))  Basic metabolic panel     Status: Abnormal   Collection Time: 08/11/23  4:30 PM  Result Value Ref Range   Sodium 134 (L) 135 - 145 mmol/L   Potassium 4.2 3.5 - 5.1 mmol/L   Chloride 102 98 - 111 mmol/L   CO2 24 22 - 32 mmol/L   Glucose, Bld 256 (H) 70 - 99 mg/dL    Comment: Glucose reference range applies only to samples taken after fasting for at least 8 hours.   BUN 20 8 - 23 mg/dL   Creatinine, Ser 8.41 (H) 0.44 - 1.00 mg/dL   Calcium 9.1 8.9 - 32.4 mg/dL   GFR, Estimated 44 (L) >60 mL/min    Comment: (NOTE) Calculated using the CKD-EPI Creatinine Equation (2021)    Anion gap 8 5 - 15    Comment: Performed at [facility], [address], [place], [place] [address]  CBC with Differential     Status: Abnormal   Collection Time: 08/11/23  4:30 PM  Result Value Ref Range   WBC 5.3 4.0 - 10.5 K/uL   RBC 3.51 (L) 3.87 - 5.11 MIL/uL   Hemoglobin 11.1 (L) 12.0 - 15.0 g/dL   HCT 72.5 (L) 36.6 - 44.0 %   MCV 99.1 80.0 - 100.0 fL   MCH 31.6 26.0 - 34.0 pg   MCHC 31.9 30.0 - 36.0 g/dL   RDW 34.7 42.5 - 95.6 %    Platelets 184 150 - 400 K/uL   nRBC 0.0 0.0 - 0.2 %   Neutrophils Relative % 60 %   Neutro Abs 3.2 1.7 - 7.7 K/uL   Lymphocytes Relative 27 %   Lymphs Abs 1.5 0.7 - 4.0 K/uL   Monocytes Relative 10 %   Monocytes Absolute 0.5 0.1 - 1.0 K/uL   Eosinophils Relative 1 %   Eosinophils Absolute 0.1 0.0 - 0.5 K/uL   Basophils Relative 1 %   Basophils Absolute 0.0 0.0 - 0.1 K/uL   Immature Granulocytes 1 %   Abs Immature Granulocytes 0.03 0.00 - 0.07 K/uL    Comment: Performed at [facility], [address] [place], [place] [address]  Troponin I (High Sensitivity)     Status: None   Collection Time: 08/11/23  4:30 PM  Result Value Ref Range   Troponin I (High Sensitivity) 15 <18 ng/L    Comment: (NOTE) Elevated high sensitivity troponin I (hsTnI) values and significant  changes across serial measurements may suggest ACS but many other  chronic and acute conditions are known to elevate hsTnI results.  Refer to the "Links" section for chest pain algorithms and additional  guidance. Performed at [facility], [address] [place], [place] [address]   Magnesium     Status: None   Collection Time: 08/11/23  4:30 PM  Result Value Ref Range   Magnesium 2.1 1.7 - 2.4 mg/dL    Comment: Performed at [facility], [address] [place], [place] [address]  SARS Coronavirus 2 by RT PCR (hospital order, performed in [facility] hospital lab) *cepheid single result test*  Anterior Nasal Swab     Status: Abnormal   Collection Time: 08/11/23  4:53 PM   Specimen: Anterior Nasal Swab  Result Value Ref Range   SARS Coronavirus 2 by RT PCR POSITIVE (A) NEGATIVE    Comment: (NOTE) SARS-CoV-2 target nucleic acids are DETECTED  SARS-CoV-2 RNA is generally detectable in upper respiratory specimens  during the acute phase of infection.  Positive results are indicative  of the presence of the identified virus, but do not rule out bacterial  infection or co-infection with other pathogens not detected by the test.  Clinical correlation with patient history and  other diagnostic information is necessary to determine patient infection status.  The expected result is negative.  Fact Sheet for Patients:   RoadLapTop.co.za   Fact Sheet for Healthcare Providers:   http://kim-miller.com/    This test is not yet approved or cleared by the [place] FDA and  has been authorized for detection and/or diagnosis of SARS-CoV-2 by FDA under an Emergency Use Authorization (EUA).  This EUA will remain in effect (meaning this test can be used) for the duration of  the COVID-19 declaration under Section 564(b)(1)  of the Act, 21 U.S.C. section 360-bbb-3(b)(1), unless the authorization is terminated or revoked sooner.   Performed at [facility], [address], [place], [place] [address]   Troponin I (High Sensitivity)     Status: None   Collection Time: 08/11/23  6:00 PM  Result Value Ref Range   Troponin I (High Sensitivity) 16 <18 ng/L    Comment: (NOTE) Elevated high sensitivity troponin I (hsTnI) values and significant  changes across serial measurements may suggest ACS but many other  chronic and acute conditions are known to elevate hsTnI results.  Refer to the "Links" section for chest pain algorithms and additional  guidance. Performed at [facility], [address] [place], [place] [address]   Urinalysis, Routine w reflex microscopic -Urine, Clean Catch     Status: Abnormal   Collection Time: 08/11/23  7:29 PM  Result Value Ref Range   Color, Urine YELLOW YELLOW   APPearance HAZY (A) CLEAR   Specific Gravity, Urine 1.013 1.005 - 1.030   pH 6.0 5.0 - 8.0   Glucose, UA 150 (A) NEGATIVE mg/dL   Hgb urine dipstick NEGATIVE NEGATIVE   Bilirubin Urine NEGATIVE NEGATIVE   Ketones, ur 5 (A) NEGATIVE mg/dL   Protein, ur NEGATIVE NEGATIVE  mg/dL   Nitrite POSITIVE (A) NEGATIVE   Leukocytes,Ua NEGATIVE NEGATIVE   RBC / HPF 0-5 0 - 5 RBC/hpf   WBC, UA 6-10 0 - 5 WBC/hpf   Bacteria, UA RARE (A) NONE SEEN   Squamous Epithelial / HPF 0-5 0 - 5 /HPF   Mucus PRESENT     Comment: Performed at [facility], [address], [place], [place] [address]  Glucose, capillary     Status: Abnormal   Collection Time: 08/11/23  9:40 PM  Result Value Ref Range   Glucose-Capillary 205 (H) 70 - 99 mg/dL    Comment: Glucose reference range applies only to samples taken after fasting for at least 8 hours.  CBC     Status: Abnormal   Collection Time: 08/12/23  5:31 AM  Result Value Ref Range   WBC 4.9 4.0 - 10.5 K/uL   RBC 3.21 (L) 3.87 - 5.11 MIL/uL   Hemoglobin 10.3 (L) 12.0 - 15.0 g/dL   HCT 91.4 (L) 78.2 - 95.6 %  MCV 99.7 80.0 - 100.0 fL   MCH 32.1 26.0 - 34.0 pg   MCHC 32.2 30.0 - 36.0 g/dL   RDW 14.7 82.9 - 56.2 %   Platelets 171 150 - 400 K/uL   nRBC 0.0 0.0 - 0.2 %    Comment: Performed at [facility], [address] [place], [place] [address]  Basic metabolic panel     Status: Abnormal   Collection Time: 08/12/23  5:31 AM  Result Value Ref Range   Sodium 135 135 - 145 mmol/L   Potassium 4.0 3.5 - 5.1 mmol/L   Chloride 101 98 - 111 mmol/L   CO2 27 22 - 32 mmol/L   Glucose, Bld 181 (H) 70 - 99 mg/dL    Comment: Glucose reference range applies only to samples taken after fasting for at least 8 hours.   BUN 14 8 - 23 mg/dL   Creatinine, Ser 5.78 0.44 - 1.00 mg/dL   Calcium 8.8 (L) 8.9 - 10.3 mg/dL   GFR, Estimated 59 (L) >60 mL/min    Comment: (NOTE) Calculated using the CKD-EPI Creatinine Equation (2021)    Anion gap 7 5 - 15    Comment: Performed at [facility], [address], [place], [place] [address]  Glucose, capillary     Status: Abnormal   Collection Time: 08/12/23  7:27 AM  Result Value Ref Range   Glucose-Capillary 182 (H) 70 - 99 mg/dL    Comment:  Glucose reference range applies only to samples taken after fasting for at least 8 hours.  Glucose, capillary     Status: Abnormal   Collection Time: 08/12/23 12:09 PM  Result Value Ref Range   Glucose-Capillary 287 (H) 70 - 99 mg/dL    Comment: Glucose reference range applies only to samples taken after fasting for at least 8 hours.  Glucose, capillary     Status: Abnormal   Collection Time: 08/12/23  3:55 PM  Result Value Ref Range   Glucose-Capillary 176 (H) 70 - 99 mg/dL    Comment: Glucose reference range applies only to samples taken after fasting for at least 8 hours.  Glucose, capillary     Status: Abnormal   Collection Time: 08/12/23  8:48 PM  Result Value Ref Range   Glucose-Capillary 235 (H) 70 - 99 mg/dL    Comment: Glucose reference range applies only to samples taken after fasting for at least 8 hours.  Glucose, capillary     Status: Abnormal   Collection Time: 08/13/23  7:58 AM  Result Value Ref Range   Glucose-Capillary 218 (H) 70 - 99 mg/dL    Comment: Glucose reference range applies only to samples taken after fasting for at least 8 hours.    ECG   N/A  Telemetry   Paced rhythm - short run of NSVT vs possible artifact overnight - Personally Reviewed  Radiology   ECHOCARDIOGRAM COMPLETE  Result Date: 08/12/2023    ECHOCARDIOGRAM REPORT   Patient Name:   Amanda Stewart Date of Exam: 08/12/2023 Medical Rec #:  [id number]   Height:       65.0 in Accession #:    [id number]  Weight:       172.6 lb Date of Birth:  05-Sep-1941   BSA:          1.858 m Patient Age:    82 years    BP:           126/68 mmHg Patient Gender: F  HR:           82 bpm. Exam Location:  Inpatient Procedure: 2D Echo, Cardiac Doppler, Color Doppler and Intracardiac            Opacification Agent Indications:    Murmur  History:        Patient has prior history of Echocardiogram examinations, most                 recent 08/07/2021. CHF, Previous Myocardial Infarction and CAD,                  Pacemaker, PAD, Arrythmias:LBBB and CHB; Risk                 Factors:Hypertension, Dyslipidemia and Diabetes. CKD.  Sonographer:    Milda Smart Referring Phys: [id number] VISHAL R PATEL  Sonographer Comments: Image acquisition challenging due to patient body habitus and Image acquisition challenging due to respiratory motion. IMPRESSIONS  1. Left ventricular ejection fraction, by estimation, is 65 to 70%. The left ventricle has normal function. The left ventricle has no regional wall motion abnormalities. 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 Elevated left ventricular end-diastolic pressure.  2. Right ventricular systolic function is normal. The right ventricular size is normal. There is normal pulmonary artery systolic pressure.  3. Left atrial size was mildly dilated.  4. The mitral valve is normal in structure. No evidence of mitral valve regurgitation. Moderate mitral annular calcification.  5. The aortic valve is calcified. Aortic valve regurgitation is not visualized. Mild to moderate aortic valve stenosis. Aortic valve area, by VTI measures 1.38 cm. Aortic valve mean gradient measures 17.5 mmHg. FINDINGS  Left Ventricle: Left ventricular ejection fraction, by estimation, is 65 to 70%. The left ventricle has normal function. The left ventricle has no regional wall motion abnormalities. Definity contrast agent was given IV to delineate the left ventricular  endocardial borders. The left ventricular internal cavity size was normal in size. 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 Left ventricular diastolic function could not be evaluated due to mitral annular calcification (moderate or greater). Elevated left ventricular end-diastolic pressure. Right Ventricle: The right ventricular size is normal. No increase in right ventricular wall thickness. Right ventricular systolic function is normal. There is normal pulmonary artery  systolic pressure. Left Atrium: Left atrial size was mildly dilated. Right Atrium: Right atrial size was normal in size. Pericardium: Trivial pericardial effusion is present. The pericardial effusion is circumferential. Mitral Valve: The mitral valve is normal in structure. Moderate mitral annular calcification. No evidence of mitral valve regurgitation. MV peak gradient, 5.0 mmHg. The mean mitral valve gradient is 3.0 mmHg. Tricuspid Valve: The tricuspid valve is normal in structure. Tricuspid valve regurgitation is not demonstrated. Aortic Valve: The aortic valve is calcified. Aortic valve regurgitation is not visualized. Mild to moderate aortic stenosis is present. Aortic valve mean gradient measures 17.5 mmHg. Aortic valve peak gradient measures 26.2 mmHg. Aortic valve area, by VTI measures 1.38 cm. Pulmonic Valve: The pulmonic valve was not well visualized. Pulmonic valve regurgitation is not visualized. No evidence of pulmonic stenosis. Aorta: The aortic root and ascending aorta are structurally normal, with no evidence of dilitation. IAS/Shunts: No atrial level shunt detected by color flow Doppler. Additional Comments: A device lead is visualized.  LEFT VENTRICLE PLAX 2D LVIDd:         3.50 cm   Diastology LVIDs:         2.10 cm  LV e' medial:    2.93 cm/s LV PW:         1.30 cm   LV E/e' medial:  32.0 LV IVS:        2.00 cm   LV e' lateral:   3.08 cm/s LVOT diam:     2.00 cm   LV E/e' lateral: 30.5 LV SV:         76 LV SV Index:   41 LVOT Area:     3.14 cm  RIGHT VENTRICLE             IVC RV S prime:     12.40 cm/s  IVC diam: 1.50 cm TAPSE (M-mode): 1.7 cm LEFT ATRIUM             Index        RIGHT ATRIUM           Index LA diam:        4.00 cm 2.15 cm/m   RA Area:     14.30 cm LA Vol (A2C):   52.0 ml 27.98 ml/m  RA Volume:   33.40 ml  17.97 ml/m LA Vol (A4C):   61.3 ml 32.99 ml/m LA Biplane Vol: 59.2 ml 31.86 ml/m  AORTIC VALVE AV Area (Vmax):    1.29 cm AV Area (Vmean):   1.27 cm AV Area (VTI):      1.38 cm AV Vmax:           256.00 cm/s AV Vmean:          197.500 cm/s AV VTI:            0.550 m AV Peak Grad:      26.2 mmHg AV Mean Grad:      17.5 mmHg LVOT Vmax:         105.00 cm/s LVOT Vmean:        79.900 cm/s LVOT VTI:          0.241 m LVOT/AV VTI ratio: 0.44  AORTA Ao Root diam: 2.80 cm Ao Asc diam:  3.30 cm MITRAL VALVE                TRICUSPID VALVE MV Area (PHT): 3.21 cm     TR Peak grad:   14.6 mmHg MV Area VTI:   2.70 cm     TR Vmax:        191.00 cm/s MV Peak grad:  5.0 mmHg MV Mean grad:  3.0 mmHg     SHUNTS MV Vmax:       1.12 m/s     Systemic VTI:  0.24 m MV Vmean:      91.8 cm/s    Systemic Diam: 2.00 cm MV Decel Time: 236 msec MV E velocity: 93.80 cm/s MV A velocity: 106.00 cm/s MV E/A ratio:  0.88 Kardie Tobb DO Electronically signed by Thomasene Ripple DO Signature Date/Time: 08/12/2023/12:47:34 PM    Final    DG Chest 2 View  Result Date: 08/11/2023 CLINICAL DATA:  Evaluate for pneumonia EXAM: CHEST - 2 VIEW COMPARISON:  08/09/2021 FINDINGS: There is a left chest wall pacer device with leads in the right atrial appendage and right ventricle. Mild cardiac enlargement. Low lung volumes. No pleural fluid or interstitial edema. No airspace opacities. Spondylosis noted within the thoracic spine. IMPRESSION: 1. Low lung volumes. 2. No acute findings. Electronically Signed   By: Signa Kell M.D.   On: 08/11/2023 16:42    Cardiac Studies   See echo above  Impression   Principal Problem:   Orthostatic hypotension Active Problems:   CAD (coronary artery disease)   Diabetes mellitus, type 2 (HCC)   Complete heart block (HCC)   History of COVID-19   Chronic kidney disease, stage 3a (HCC)   Hypertension associated with diabetes (HCC)   Hyperlipidemia associated with type 2 diabetes mellitus (HCC)   PAD (peripheral artery disease) (HCC)   Physical deconditioning   Recommendation   Orthostatic hypotension  Noted to be orthostatic on admission - BP meds held. Coreg restarted,  however, the alpha properties can contribute to orthostasis as well. Would advise switch to metoprolol. As bp tolerates, if she is no longer orthostatic, could restart home meds as needed.  Aortic stenosis Stable - echo shows supranormal LVEF - mild to moderate AS. This is not contributing to symptoms. COVID 19 positive This appears persistent for more than 1 month - could be  persistent positive lab, or persistent infection or re-infection. Viremia could cause orthostatic hypotension - ?empiric antiviral treatment. PPM Stable - pacing, ?NSVT vs artifact, asymptomatic. K and Mg are normal.  [facility] will sign off.  No further suggestions at this time.  Medication Recommendations:  changes as above Other recommendations (labs, testing, etc):  none Follow up as an outpatient:  Dr. Anne Stewart   Time Spent Directly with Patient:  I have spent a total of 45 minutes with the patient reviewing hospital notes, telemetry, EKGs, labs and examining the patient as well as establishing an assessment and plan that was discussed personally with the patient.  > 50% of time was spent in direct patient care.  Length of Stay:  LOS: 0 days   Chrystie Nose, MD, [facility], FACP  [place]  [facility]  Medical Director of the Advanced Lipid Disorders &  Cardiovascular Risk Reduction Clinic Diplomate of the American Board of Clinical Lipidology Attending Cardiologist  Direct Dial: [phone]  Fax: [phone]  Website:  [email or web address] 08/13/2023, 10:37 AM

## 2023-08-16 ENCOUNTER — Ambulatory Visit (HOSPITAL_BASED_OUTPATIENT_CLINIC_OR_DEPARTMENT_OTHER): Payer: 59 | Admitting: Cardiology

## 2023-08-20 ENCOUNTER — Encounter: Payer: 59 | Admitting: Podiatry

## 2023-08-20 NOTE — Progress Notes (Signed)
Patient was a no-show for today's scheduled appointment.

## 2023-08-23 NOTE — Progress Notes (Signed)
Remote pacemaker transmission.   

## 2023-08-29 ENCOUNTER — Encounter (HOSPITAL_COMMUNITY): Payer: Self-pay

## 2023-08-29 ENCOUNTER — Emergency Department (HOSPITAL_COMMUNITY): Payer: 59

## 2023-08-29 ENCOUNTER — Inpatient Hospital Stay (HOSPITAL_COMMUNITY)
Admission: EM | Admit: 2023-08-29 | Discharge: 2023-09-07 | DRG: 035 | Disposition: A | Discharge status: Skilled Nursing Facility | Payer: 59 | Attending: Family Medicine | Admitting: Family Medicine

## 2023-08-29 DIAGNOSIS — I63231 Cerebral infarction due to unspecified occlusion or stenosis of right carotid arteries: Secondary | ICD-10-CM | POA: Diagnosis not present

## 2023-08-29 DIAGNOSIS — R296 Repeated falls: Secondary | ICD-10-CM | POA: Diagnosis present

## 2023-08-29 DIAGNOSIS — R2990 Unspecified symptoms and signs involving the nervous system: Secondary | ICD-10-CM | POA: Diagnosis not present

## 2023-08-29 DIAGNOSIS — Y92009 Unspecified place in unspecified non-institutional (private) residence as the place of occurrence of the external cause: Secondary | ICD-10-CM | POA: Diagnosis not present

## 2023-08-29 DIAGNOSIS — E1165 Type 2 diabetes mellitus with hyperglycemia: Secondary | ICD-10-CM | POA: Diagnosis present

## 2023-08-29 DIAGNOSIS — E1142 Type 2 diabetes mellitus with diabetic polyneuropathy: Secondary | ICD-10-CM | POA: Diagnosis present

## 2023-08-29 DIAGNOSIS — I739 Peripheral vascular disease, unspecified: Secondary | ICD-10-CM | POA: Diagnosis not present

## 2023-08-29 DIAGNOSIS — E785 Hyperlipidemia, unspecified: Secondary | ICD-10-CM | POA: Diagnosis present

## 2023-08-29 DIAGNOSIS — Z7982 Long term (current) use of aspirin: Secondary | ICD-10-CM

## 2023-08-29 DIAGNOSIS — I5032 Chronic diastolic (congestive) heart failure: Secondary | ICD-10-CM | POA: Diagnosis present

## 2023-08-29 DIAGNOSIS — E1169 Type 2 diabetes mellitus with other specified complication: Secondary | ICD-10-CM | POA: Diagnosis present

## 2023-08-29 DIAGNOSIS — N1832 Chronic kidney disease, stage 3b: Secondary | ICD-10-CM | POA: Diagnosis present

## 2023-08-29 DIAGNOSIS — R471 Dysarthria and anarthria: Secondary | ICD-10-CM | POA: Diagnosis present

## 2023-08-29 DIAGNOSIS — I152 Hypertension secondary to endocrine disorders: Secondary | ICD-10-CM | POA: Diagnosis not present

## 2023-08-29 DIAGNOSIS — I63411 Cerebral infarction due to embolism of right middle cerebral artery: Secondary | ICD-10-CM | POA: Diagnosis present

## 2023-08-29 DIAGNOSIS — R29810 Facial weakness: Secondary | ICD-10-CM | POA: Diagnosis present

## 2023-08-29 DIAGNOSIS — Z87891 Personal history of nicotine dependence: Secondary | ICD-10-CM

## 2023-08-29 DIAGNOSIS — G8194 Hemiplegia, unspecified affecting left nondominant side: Secondary | ICD-10-CM | POA: Diagnosis present

## 2023-08-29 DIAGNOSIS — E1151 Type 2 diabetes mellitus with diabetic peripheral angiopathy without gangrene: Secondary | ICD-10-CM | POA: Diagnosis present

## 2023-08-29 DIAGNOSIS — Z823 Family history of stroke: Secondary | ICD-10-CM

## 2023-08-29 DIAGNOSIS — E860 Dehydration: Secondary | ICD-10-CM | POA: Diagnosis present

## 2023-08-29 DIAGNOSIS — Z794 Long term (current) use of insulin: Secondary | ICD-10-CM

## 2023-08-29 DIAGNOSIS — I130 Hypertensive heart and chronic kidney disease with heart failure and stage 1 through stage 4 chronic kidney disease, or unspecified chronic kidney disease: Secondary | ICD-10-CM | POA: Diagnosis present

## 2023-08-29 DIAGNOSIS — K219 Gastro-esophageal reflux disease without esophagitis: Secondary | ICD-10-CM | POA: Diagnosis present

## 2023-08-29 DIAGNOSIS — I442 Atrioventricular block, complete: Secondary | ICD-10-CM | POA: Diagnosis present

## 2023-08-29 DIAGNOSIS — E876 Hypokalemia: Secondary | ICD-10-CM | POA: Diagnosis present

## 2023-08-29 DIAGNOSIS — I447 Left bundle-branch block, unspecified: Secondary | ICD-10-CM | POA: Diagnosis present

## 2023-08-29 DIAGNOSIS — Z950 Presence of cardiac pacemaker: Secondary | ICD-10-CM | POA: Diagnosis not present

## 2023-08-29 DIAGNOSIS — Z833 Family history of diabetes mellitus: Secondary | ICD-10-CM

## 2023-08-29 DIAGNOSIS — I350 Nonrheumatic aortic (valve) stenosis: Secondary | ICD-10-CM | POA: Diagnosis present

## 2023-08-29 DIAGNOSIS — R29700 NIHSS score 0: Secondary | ICD-10-CM | POA: Diagnosis present

## 2023-08-29 DIAGNOSIS — Z7984 Long term (current) use of oral hypoglycemic drugs: Secondary | ICD-10-CM

## 2023-08-29 DIAGNOSIS — E11319 Type 2 diabetes mellitus with unspecified diabetic retinopathy without macular edema: Secondary | ICD-10-CM | POA: Diagnosis present

## 2023-08-29 DIAGNOSIS — E1159 Type 2 diabetes mellitus with other circulatory complications: Secondary | ICD-10-CM | POA: Diagnosis present

## 2023-08-29 DIAGNOSIS — I2510 Atherosclerotic heart disease of native coronary artery without angina pectoris: Secondary | ICD-10-CM | POA: Diagnosis present

## 2023-08-29 DIAGNOSIS — I6521 Occlusion and stenosis of right carotid artery: Secondary | ICD-10-CM | POA: Diagnosis not present

## 2023-08-29 DIAGNOSIS — E1122 Type 2 diabetes mellitus with diabetic chronic kidney disease: Secondary | ICD-10-CM | POA: Diagnosis present

## 2023-08-29 DIAGNOSIS — Z79899 Other long term (current) drug therapy: Secondary | ICD-10-CM

## 2023-08-29 DIAGNOSIS — Z7902 Long term (current) use of antithrombotics/antiplatelets: Secondary | ICD-10-CM

## 2023-08-29 DIAGNOSIS — I252 Old myocardial infarction: Secondary | ICD-10-CM | POA: Diagnosis not present

## 2023-08-29 DIAGNOSIS — I959 Hypotension, unspecified: Secondary | ICD-10-CM | POA: Diagnosis present

## 2023-08-29 DIAGNOSIS — N1831 Chronic kidney disease, stage 3a: Secondary | ICD-10-CM | POA: Diagnosis not present

## 2023-08-29 DIAGNOSIS — W19XXXA Unspecified fall, initial encounter: Secondary | ICD-10-CM | POA: Diagnosis present

## 2023-08-29 DIAGNOSIS — I639 Cerebral infarction, unspecified: Secondary | ICD-10-CM | POA: Diagnosis present

## 2023-08-29 DIAGNOSIS — Z888 Allergy status to other drugs, medicaments and biological substances status: Secondary | ICD-10-CM

## 2023-08-29 LAB — PROTIME-INR
INR: 0.9 (ref 0.8–1.2)
Prothrombin Time: 12.8 s (ref 11.4–15.2)

## 2023-08-29 LAB — COMPREHENSIVE METABOLIC PANEL
ALT: 30 U/L (ref 0–44)
AST: 29 U/L (ref 15–41)
Albumin: 3.9 g/dL (ref 3.5–5.0)
Alkaline Phosphatase: 118 U/L (ref 38–126)
Anion gap: 14 (ref 5–15)
BUN: 17 mg/dL (ref 8–23)
CO2: 20 mmol/L — ABNORMAL LOW (ref 22–32)
Calcium: 9.5 mg/dL (ref 8.9–10.3)
Chloride: 107 mmol/L (ref 98–111)
Creatinine, Ser: 1.18 mg/dL — ABNORMAL HIGH (ref 0.44–1.00)
GFR, Estimated: 46 mL/min — ABNORMAL LOW (ref >60)
Glucose, Bld: 282 mg/dL — ABNORMAL HIGH (ref 70–99)
Potassium: 3.6 mmol/L (ref 3.5–5.1)
Sodium: 141 mmol/L (ref 135–145)
Total Bilirubin: 0.7 mg/dL (ref 0.3–1.2)
Total Protein: 6.9 g/dL (ref 6.5–8.1)

## 2023-08-29 LAB — I-STAT CHEM 8, ED
BUN: 17 mg/dL (ref 8–23)
Calcium, Ion: 1.11 mmol/L — ABNORMAL LOW (ref 1.15–1.40)
Chloride: 109 mmol/L (ref 98–111)
Creatinine, Ser: 1.2 mg/dL — ABNORMAL HIGH (ref 0.44–1.00)
Glucose, Bld: 283 mg/dL — ABNORMAL HIGH (ref 70–99)
HCT: 38 % (ref 36.0–46.0)
Hemoglobin: 12.9 g/dL (ref 12.0–15.0)
Potassium: 3.6 mmol/L (ref 3.5–5.1)
Sodium: 141 mmol/L (ref 135–145)
TCO2: 20 mmol/L — ABNORMAL LOW (ref 22–32)

## 2023-08-29 LAB — DIFFERENTIAL
Abs Immature Granulocytes: 0.07 10*3/uL (ref 0.00–0.07)
Basophils Absolute: 0.1 10*3/uL (ref 0.0–0.1)
Basophils Relative: 1 %
Eosinophils Absolute: 0 10*3/uL (ref 0.0–0.5)
Eosinophils Relative: 1 %
Immature Granulocytes: 1 %
Lymphocytes Relative: 17 %
Lymphs Abs: 1.4 10*3/uL (ref 0.7–4.0)
Monocytes Absolute: 0.5 10*3/uL (ref 0.1–1.0)
Monocytes Relative: 6 %
Neutro Abs: 6 10*3/uL (ref 1.7–7.7)
Neutrophils Relative %: 74 %

## 2023-08-29 LAB — ETHANOL: Alcohol, Ethyl (B): <10 mg/dL (ref <10)

## 2023-08-29 LAB — CBC
HCT: 38.6 % (ref 36.0–46.0)
Hemoglobin: 12.7 g/dL (ref 12.0–15.0)
MCH: 32.2 pg (ref 26.0–34.0)
MCHC: 32.9 g/dL (ref 30.0–36.0)
MCV: 97.7 fL (ref 80.0–100.0)
Platelets: 196 10*3/uL (ref 150–400)
RBC: 3.95 MIL/uL (ref 3.87–5.11)
RDW: 14.6 % (ref 11.5–15.5)
WBC: 8 10*3/uL (ref 4.0–10.5)
nRBC: 0 % (ref 0.0–0.2)

## 2023-08-29 LAB — URINALYSIS, ROUTINE W REFLEX MICROSCOPIC
Bilirubin Urine: NEGATIVE
Glucose, UA: >=500 mg/dL — AB
Hgb urine dipstick: NEGATIVE
Ketones, ur: NEGATIVE mg/dL
Leukocytes,Ua: NEGATIVE
Nitrite: POSITIVE — AB
Protein, ur: NEGATIVE mg/dL
Specific Gravity, Urine: 1.026 (ref 1.005–1.030)
pH: 5 (ref 5.0–8.0)

## 2023-08-29 LAB — CBG MONITORING, ED: Glucose-Capillary: 261 mg/dL — ABNORMAL HIGH (ref 70–99)

## 2023-08-29 LAB — APTT: aPTT: 24 s (ref 24–36)

## 2023-08-29 MED ORDER — SODIUM CHLORIDE 0.9% FLUSH: 3.0000 mL | Freq: Once | INTRAVENOUS | Status: DC

## 2023-08-29 MED ORDER — ACETAMINOPHEN 325 MG PO TABS
650.0000 mg | ORAL_TABLET | Freq: Four times a day (QID) | ORAL | Status: DC | PRN
  Administered 2023-08-30 – 2023-09-07 (×10): 650 mg via ORAL
  Filled 2023-08-29 (×10): qty 2

## 2023-08-29 MED ORDER — MELATONIN 5 MG PO TABS
5.0000 mg | ORAL_TABLET | Freq: Every evening | ORAL | Status: DC | PRN
  Administered 2023-08-30 – 2023-09-06 (×6): 5 mg via ORAL
  Filled 2023-08-29 (×6): qty 1

## 2023-08-29 MED ORDER — ENOXAPARIN SODIUM 40 MG/0.4ML IJ SOSY
40.0000 mg | PREFILLED_SYRINGE | INTRAMUSCULAR | Status: DC
  Administered 2023-08-30 – 2023-09-03 (×6): 40 mg via SUBCUTANEOUS
  Filled 2023-08-29 (×5): qty 0.4

## 2023-08-29 MED ORDER — DORZOLAMIDE HCL 2 % OP SOLN
1.0000 [drp] | Freq: Two times a day (BID) | OPHTHALMIC | Status: DC
  Administered 2023-08-30 – 2023-09-07 (×15): 1 [drp] via OPHTHALMIC
  Filled 2023-08-29 (×2): qty 10

## 2023-08-29 MED ORDER — ADULT MULTIVITAMIN W/MINERALS CH
1.0000 | ORAL_TABLET | Freq: Every day | ORAL | Status: DC
  Administered 2023-08-30 – 2023-09-07 (×8): 1 via ORAL
  Filled 2023-08-29 (×9): qty 1

## 2023-08-29 MED ORDER — ATORVASTATIN CALCIUM 40 MG PO TABS
40.0000 mg | ORAL_TABLET | Freq: Every day | ORAL | Status: DC
  Administered 2023-08-30: 40 mg via ORAL
  Filled 2023-08-29: qty 1

## 2023-08-29 MED ORDER — INSULIN ASPART 100 UNIT/ML IJ SOLN
0.0000 [IU] | INTRAMUSCULAR | Status: DC
  Administered 2023-08-30: 3 [IU] via SUBCUTANEOUS
  Administered 2023-08-30: 1 [IU] via SUBCUTANEOUS
  Administered 2023-08-30 (×2): 2 [IU] via SUBCUTANEOUS
  Administered 2023-08-31: 3 [IU] via SUBCUTANEOUS
  Administered 2023-08-31: 1 [IU] via SUBCUTANEOUS
  Administered 2023-08-31 (×2): 5 [IU] via SUBCUTANEOUS
  Administered 2023-09-01: 1 [IU] via SUBCUTANEOUS
  Administered 2023-09-01: 3 [IU] via SUBCUTANEOUS
  Administered 2023-09-01: 1 [IU] via SUBCUTANEOUS
  Administered 2023-09-01: 2 [IU] via SUBCUTANEOUS
  Administered 2023-09-01: 3 [IU] via SUBCUTANEOUS
  Administered 2023-09-01: 7 [IU] via SUBCUTANEOUS
  Administered 2023-09-02 (×2): 2 [IU] via SUBCUTANEOUS
  Administered 2023-09-02: 3 [IU] via SUBCUTANEOUS
  Administered 2023-09-02 (×2): 2 [IU] via SUBCUTANEOUS
  Administered 2023-09-03: 1 [IU] via SUBCUTANEOUS
  Administered 2023-09-03 (×3): 2 [IU] via SUBCUTANEOUS
  Administered 2023-09-03: 1 [IU] via SUBCUTANEOUS
  Administered 2023-09-03: 3 [IU] via SUBCUTANEOUS
  Administered 2023-09-03 – 2023-09-05 (×7): 2 [IU] via SUBCUTANEOUS
  Administered 2023-09-06 (×3): 1 [IU] via SUBCUTANEOUS
  Administered 2023-09-06: 3 [IU] via SUBCUTANEOUS
  Administered 2023-09-06: 2 [IU] via SUBCUTANEOUS
  Administered 2023-09-07: 1 [IU] via SUBCUTANEOUS
  Administered 2023-09-07: 3 [IU] via SUBCUTANEOUS
  Administered 2023-09-07 (×2): 2 [IU] via SUBCUTANEOUS

## 2023-08-29 MED ORDER — PROCHLORPERAZINE EDISYLATE 10 MG/2ML IJ SOLN: 5.0000 mg | Freq: Four times a day (QID) | INTRAMUSCULAR | Status: DC | PRN

## 2023-08-29 MED ORDER — POLYETHYLENE GLYCOL 3350 17 G PO PACK
17.0000 g | PACK | Freq: Every day | ORAL | Status: DC | PRN
  Administered 2023-09-01: 17 g via ORAL
  Filled 2023-08-29 (×2): qty 1

## 2023-08-29 MED ORDER — CILOSTAZOL 100 MG PO TABS
100.0000 mg | ORAL_TABLET | Freq: Every day | ORAL | Status: DC
  Administered 2023-08-30: 100 mg via ORAL
  Filled 2023-08-29 (×2): qty 1

## 2023-08-29 MED ORDER — LACTATED RINGERS IV BOLUS
1000.0000 mL | Freq: Once | INTRAVENOUS | Status: AC
  Administered 2023-08-29: 1000 mL via INTRAVENOUS

## 2023-08-29 MED ORDER — PANTOPRAZOLE SODIUM 40 MG PO TBEC
40.0000 mg | DELAYED_RELEASE_TABLET | Freq: Every day | ORAL | Status: DC
  Administered 2023-08-30 – 2023-09-07 (×9): 40 mg via ORAL
  Filled 2023-08-29 (×9): qty 1

## 2023-08-29 MED ORDER — ASPIRIN 81 MG PO TBEC
81.0000 mg | DELAYED_RELEASE_TABLET | Freq: Every day | ORAL | Status: DC
  Administered 2023-08-30 – 2023-09-07 (×9): 81 mg via ORAL
  Filled 2023-08-29 (×9): qty 1

## 2023-08-29 NOTE — ED Provider Notes (Signed)
EMERGENCY DEPARTMENT AT [facility] Provider Note   CSN: [id number] Arrival date & time: 08/29/23  1613     History  Chief Complaint  Patient presents with   Dizziness    Amanda Stewart is a 82 y.o. female with PMH as stated below who presents after a fall at home/hitting head this afternoon. Denies LOC; not on blood thinners. Was on the ground for 45 minutes and was eventually able to pick herself up. Patient recently discharged 9/16 after recurrent falls and states she has fallen 10 times since discharge. Patient states she feels weak when standing which causes falls. Also trips over walker she uses at baseline which has caused some of her falls too. Phone call to daughter, Sue Lush, who states patient fell yesterday and soon thereafter developed left-sided facial droop and left arm weakness. Daughter states patient has not been eating/drinking much. Patient endorses diarrhea 3X/day for the past few weeks. Denies recent Abx. Also endorses headache and pain "all over" that she attributes to falls. Denies fever, chills, chest pain/palpitations, SOB, abdominal pain, dysuria, hematochezia/melena.  Past Medical History:  Diagnosis Date   Allergic rhinitis    Anemia    Arrhythmia    CAD (coronary artery disease)    Chest pain    Chronic kidney disease, stage 3a (HCC) 08/11/2023   Combined systolic and diastolic heart failure (HCC)    Diabetes mellitus without complication (HCC)    Diabetic peripheral neuropathy (HCC)    GERD (gastroesophageal reflux disease)    Heart block    Hyperlipidemia    Hypertension    Left bundle branch block    Low back pain    Myocardial infarction (HCC)    Peripheral arterial disease (HCC)    Retinopathy         Home Medications Prior to Admission medications   Medication Sig Start Date End Date Taking? Authorizing Provider  acetaminophen (TYLENOL) 325 MG tablet Take 1-2 tablets (325-650 mg total) by mouth every 4 (four) hours as  needed for mild pain. Patient taking differently: Take 325 mg by mouth every 4 (four) hours as needed for mild pain. 08/10/21  Yes [name], MD  albuterol (VENTOLIN HFA) 108 (90 Base) MCG/ACT inhaler Inhale 2 puffs into the lungs every 6 (six) hours as needed for wheezing or shortness of breath.   Yes [provider]  amLODipine (NORVASC) 10 MG tablet Take 10 mg by mouth daily.   Yes [provider]  aspirin EC 81 MG tablet Take 81 mg by mouth daily.   Yes [provider]  atorvastatin (LIPITOR) 40 MG tablet Take 40 mg by mouth daily.   Yes [provider]  benzonatate (TESSALON) 100 MG capsule Take 100-200 mg by mouth every 8 (eight) hours as needed for cough. 07/27/23  Yes [provider]  cilostazol (PLETAL) 100 MG tablet Take 100 mg by mouth daily.   Yes [provider]  dorzolamide (TRUSOPT) 2 % ophthalmic solution Place 1 drop into both eyes 2 (two) times daily. 07/09/21  Yes [provider]  empagliflozin (JARDIANCE) 25 MG TABS tablet Take 25 mg by mouth daily.   Yes [provider]  glimepiride (AMARYL) 1 MG tablet Take 1 mg by mouth daily with breakfast.   Yes [provider]  hydrALAZINE (APRESOLINE) 50 MG tablet Take 50 mg by mouth 2 (two) times daily. 07/09/21  Yes [provider]  losartan (COZAAR) 100 MG tablet Take 1 tablet (100 mg total)  by mouth daily. 08/10/21  Yes [name], MD  metoprolol tartrate (LOPRESSOR) 25 MG tablet Take 1 tablet (25 mg total) by mouth 2 (two) times daily. 08/13/23  Yes Lewie Chamber, MD  Multiple Vitamin (MULTIVITAMIN WITH MINERALS) TABS tablet Take 1 tablet by mouth daily.   Yes [provider]  nitroGLYCERIN (NITROSTAT) 0.4 MG SL tablet Place 1 tablet (0.4 mg total) under the tongue every 5 (five) minutes x 3 doses as needed for chest pain. 12/12/14  Yes [name], MD  pantoprazole (PROTONIX) 40 MG tablet Take 1 tablet (40 mg total) by mouth daily at  6 (six) AM. 08/11/21  Yes [name], MD  insulin glargine (LANTUS) 100 UNIT/ML Solostar Pen Inject 5 Units into the skin at bedtime. Patient not taking: Reported on 08/11/2023 08/10/21   [name], MD      Allergies    Metformin and related    Review of Systems   Review of Systems  Neurological:  Positive for dizziness.    Physical Exam Updated Vital Signs BP 137/69 (BP Location: Left Arm)   Pulse 87   Temp 98.3 F (36.8 C) (Oral)   Resp 20   Ht 5\' 5"  (1.651 m)   Wt 78.4 kg   SpO2 99%   BMI 28.76 kg/m  Physical Exam Constitutional:      General: She is not in acute distress. HENT:     Head: Normocephalic and atraumatic.  Cardiovascular:     Rate and Rhythm: Normal rate and regular rhythm.     Heart sounds: Murmur heard.     No friction rub. No gallop.  Pulmonary:     Effort: Pulmonary effort is normal.     Breath sounds: Normal breath sounds.  Abdominal:     Palpations: Abdomen is soft.     Tenderness: There is no abdominal tenderness.  Musculoskeletal:        General: No tenderness.     Right lower leg: No edema.     Left lower leg: No edema.  Skin:    General: Skin is warm and dry.  Neurological:     Mental Status: She is alert and oriented to person, place, and time.     Cranial Nerves: Facial asymmetry present. No dysarthria.     Motor: No weakness.     Comments: Left-sided facial droop     ED Results / Procedures / Treatments   Labs (all labs ordered are listed, but only abnormal results are displayed) Labs Reviewed  COMPREHENSIVE METABOLIC PANEL - Abnormal; Notable for the following components:      Result Value   CO2 20 (*)    Glucose, Bld 282 (*)    Creatinine, Ser 1.18 (*)    GFR, Estimated 46 (*)    All other components within normal limits  URINALYSIS, ROUTINE W REFLEX MICROSCOPIC - Abnormal; Notable for the following components:   Glucose, UA >=500 (*)    Nitrite POSITIVE (*)    Bacteria, UA RARE (*)    All other components within  normal limits  I-STAT CHEM 8, ED - Abnormal; Notable for the following components:   Creatinine, Ser 1.20 (*)    Glucose, Bld 283 (*)    Calcium, Ion 1.11 (*)    TCO2 20 (*)    All other components within normal limits  CBG MONITORING, ED - Abnormal; Notable for the following components:   Glucose-Capillary 261 (*)    All other components within normal limits  PROTIME-INR  APTT  CBC  DIFFERENTIAL  ETHANOL    EKG None  Radiology DG Chest 2 View  Result Date: 08/29/2023 CLINICAL DATA:  Altered mental status. Fell yesterday. Facial droop and slurred speech. Headache and generalized weakness. EXAM: CHEST - 2 VIEW COMPARISON:  08/11/2023 FINDINGS: Cardiac pacemaker. Heart size and pulmonary vascularity are normal. Lungs are clear. No pleural effusions. No pneumothorax. Mediastinal contours appear intact. Degenerative changes in the spine and shoulders. IMPRESSION: No active cardiopulmonary disease. Electronically Signed   By: Burman Nieves M.D.   On: 08/29/2023 19:12   CT HEAD WO CONTRAST ( )  Result Date: 08/29/2023 CLINICAL DATA:  Minor head trauma.  Dizziness.  Poly trauma, blunt. EXAM: CT HEAD WITHOUT CONTRAST CT CERVICAL SPINE WITHOUT CONTRAST TECHNIQUE: Multidetector CT imaging of the head and cervical spine was performed following the standard protocol without intravenous contrast. Multiplanar CT image reconstructions of the cervical spine were also generated. RADIATION DOSE REDUCTION: This exam was performed according to the departmental dose-optimization program which includes automated exposure control, adjustment of the mA and/or kV according to patient size and/or use of iterative reconstruction technique. COMPARISON:  CT head 05/30/2014 FINDINGS: CT HEAD FINDINGS Brain: Diffuse cerebral atrophy. Mild ventricular dilatation consistent with central atrophy. Low-attenuation changes in the deep white matter consistent with small vessel ischemia. Old calcified extra-axial lesion  along the anterior falx to the right measuring 0.8 x 1.9 cm. This is likely a meningioma. Mild enlargement since previous study. No mass-effect or midline shift. No abnormal extra-axial fluid collections. Gray-white matter junctions are distinct. Basal cisterns are not effaced. No acute intracranial hemorrhage. Vascular: No hyperdense vessel or unexpected calcification. Skull: Normal. Negative for fracture or focal lesion. Sinuses/Orbits: No acute finding. Other: None. CT CERVICAL SPINE FINDINGS Alignment: Normal alignment. Skull base and vertebrae: No acute fracture. No primary bone lesion or focal pathologic process. Soft tissues and spinal canal: No prevertebral fluid or swelling. No visible canal hematoma. Disc levels: Degenerative changes throughout with narrowed disc space interspaces and associated endplate osteophyte formation. Moderately prominent disc osteophyte complexes at C2-3, C4-5, and C6-7 causing some encroachment upon the central canal. Uncovertebral and facet joint spurring causes some encroachment upon the neural foramina bilaterally. Upper chest: Lung apices are clear. Other: None. IMPRESSION: 1. No acute intracranial abnormalities. Chronic atrophy and small vessel ischemic changes. Small right parafalcine meningioma. 2. Normal alignment of the cervical spine. No acute displaced fractures identified. Diffuse degenerative change. Electronically Signed   By: Burman Nieves M.D.   On: 08/29/2023 19:10   CT CERVICAL SPINE WO CONTRAST  Result Date: 08/29/2023 CLINICAL DATA:  Minor head trauma.  Dizziness.  Poly trauma, blunt. EXAM: CT HEAD WITHOUT CONTRAST CT CERVICAL SPINE WITHOUT CONTRAST TECHNIQUE: Multidetector CT imaging of the head and cervical spine was performed following the standard protocol without intravenous contrast. Multiplanar CT image reconstructions of the cervical spine were also generated. RADIATION DOSE REDUCTION: This exam was performed according to the departmental  dose-optimization program which includes automated exposure control, adjustment of the mA and/or kV according to patient size and/or use of iterative reconstruction technique. COMPARISON:  CT head 05/30/2014 FINDINGS: CT HEAD FINDINGS Brain: Diffuse cerebral atrophy. Mild ventricular dilatation consistent with central atrophy. Low-attenuation changes in the deep white matter consistent with small vessel ischemia. Old calcified extra-axial lesion along the anterior falx to the right measuring 0.8 x 1.9 cm. This is likely a meningioma. Mild enlargement since previous study. No mass-effect or midline shift. No abnormal extra-axial fluid collections. Gray-white matter junctions are distinct.  Basal cisterns are not effaced. No acute intracranial hemorrhage. Vascular: No hyperdense vessel or unexpected calcification. Skull: Normal. Negative for fracture or focal lesion. Sinuses/Orbits: No acute finding. Other: None. CT CERVICAL SPINE FINDINGS Alignment: Normal alignment. Skull base and vertebrae: No acute fracture. No primary bone lesion or focal pathologic process. Soft tissues and spinal canal: No prevertebral fluid or swelling. No visible canal hematoma. Disc levels: Degenerative changes throughout with narrowed disc space interspaces and associated endplate osteophyte formation. Moderately prominent disc osteophyte complexes at C2-3, C4-5, and C6-7 causing some encroachment upon the central canal. Uncovertebral and facet joint spurring causes some encroachment upon the neural foramina bilaterally. Upper chest: Lung apices are clear. Other: None. IMPRESSION: 1. No acute intracranial abnormalities. Chronic atrophy and small vessel ischemic changes. Small right parafalcine meningioma. 2. Normal alignment of the cervical spine. No acute displaced fractures identified. Diffuse degenerative change. Electronically Signed   By: Burman Nieves M.D.   On: 08/29/2023 19:10    Procedures Procedures    Medications Ordered  in ED Medications  sodium chloride flush (NS) 0.9 % injection 3 mL (3 mLs Intravenous Not Given 08/29/23 1645)  lactated ringers bolus 1,000 mL (0 mLs Intravenous Stopped 08/29/23 2202)    ED Course/ Medical Decision Making/ A&P Clinical Course as of 08/29/23 2224  Wed Aug 29, 2023  1717 Call patient's daughter for further recommendations and history. She states that the patient has been more confused since her last discharge from the hospital.  She has had multiple falls over the past week including 2 in the past 24 hours.  The family checks on her every day.  Today the patient was okay at lunch but fell between the hours of 12 and 3.  The patient's daughter noticed some left hand discoordination and facial droop yesterday at approximately noon but it resolved into the afternoon.  She states the patient has a daily diarrhea which seems to have been worse since her recent initiation of Jardiance.  She is also having polyuria since starting this medication.  Daughter feels that she has not been taking enough fluids p.o. [CC]    Clinical Course User Index [CC] Glyn Ade, MD                                 Medical Decision Making Amount and/or Complexity of Data Reviewed Labs: ordered. Radiology: ordered.   82 year old female who presents with recurrent falls. Patient developed left-sided facial droop yesterday after a fall and is endorsing left-sided arm weakness with inability to hold things in her hand. Physical exam shows left-sided facial droop but intact strength throughout bilateral U/LE. CT head with no signs of bleed. CT cervical spine without acute pathology. 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 Neurology consulted to further evaluate patient and believes admission is appropriate for stoke work-up.        Final Clinical Impression(s) / ED Diagnoses Final diagnoses:  Falls frequently  Stroke-like symptoms    Rx / DC Orders ED Discharge Orders     None          Carmina Miller, DO 08/29/23 2252

## 2023-08-29 NOTE — H&P (Signed)
History and Physical  Amanda Stewart AVW:[id number] DOB: November 21, 1941 DOA: 08/29/2023  Referring physician: Dr. Annie Paras, EDP  PCP: Fleet Contras, MD  Outpatient Specialists: Cardiology Patient coming from: Home  Chief Complaint: Falls   HPI: Amanda Stewart is a 82 y.o. female with medical history significant for coronary artery disease, chronic diastolic CHF, type 2 diabetes, diabetic polyneuropathy, hyperlipidemia, hypertension, LBBB, status post pacemaker placement, peripheral artery disease on Aspirin and Pletal, CKD 3B, who presented from home after multiple falls.  Associated with generalized weakness.  Her daughter noted left facial droop and slurred speech yesterday around 5 PM after another fall.  Recently admitted from 08/11/2023 through 08/13/2023 due to generalized weakness, dizziness, found to have orthostatic hypotension, was discharged to home with home health PT OT.  In the ED, seen by neurology/stroke team.  Noncontrast CT head and cervical spine CT were nonacute.  Admitted for stroke workup.  ED Course: Temperature 98.3.  BP 137/69, pulse 87, respiratory 20, saturating 99% on room air.  Lab studies remarkable for elevated glucose 223, creatinine 1.20 with GFR 46.  UA nitrite positive with WBC 0-5.  Review of Systems: Review of systems as noted in the HPI. All other systems reviewed and are negative.   Past Medical History:  Diagnosis Date   Allergic rhinitis    Anemia    Arrhythmia    CAD (coronary artery disease)    Chest pain    Chronic kidney disease, stage 3a (HCC) 08/11/2023   Combined systolic and diastolic heart failure (HCC)    Diabetes mellitus without complication (HCC)    Diabetic peripheral neuropathy (HCC)    GERD (gastroesophageal reflux disease)    Heart block    Hyperlipidemia    Hypertension    Left bundle branch block    Low back pain    Myocardial infarction (HCC)    Peripheral arterial disease (HCC)    Retinopathy    Past Surgical History:   Procedure Laterality Date   ABDOMINAL HYSTERECTOMY     LEFT AND RIGHT HEART CATHETERIZATION WITH CORONARY ANGIOGRAM N/A 05/26/2014   Procedure: LEFT AND RIGHT HEART CATHETERIZATION WITH CORONARY ANGIOGRAM;  Surgeon: [name], MD;  Location: [facility] CATH LAB;  Service: Cardiovascular;  Laterality: N/A;   PACEMAKER IMPLANT N/A 08/08/2021   Procedure: PACEMAKER IMPLANT;  Surgeon: Lanier Prude, MD;  Location: [facility] INVASIVE CV LAB;  Service: Cardiovascular;  Laterality: N/A;   TEMPORARY PACEMAKER N/A 08/07/2021   Procedure: TEMPORARY PACEMAKER;  Surgeon: Rinaldo Cloud, MD;  Location: [facility] INVASIVE CV LAB;  Service: Cardiovascular;  Laterality: N/A;    Social History:  reports that she has never smoked. She has never used smokeless tobacco. She reports current alcohol use of about 1.0 standard drink of alcohol per week. She reports that she does not use drugs.   Allergies  Allergen Reactions   Metformin And Related Other (See Comments)    Diarrhea     Family History  Problem Relation Age of Onset   Diabetes Mother    Diabetes Father    Stroke Father       Prior to Admission medications   Medication Sig Start Date End Date Taking? Authorizing Provider  acetaminophen (TYLENOL) 325 MG tablet Take 1-2 tablets (325-650 mg total) by mouth every 4 (four) hours as needed for mild pain. Patient taking differently: Take 325 mg by mouth every 4 (four) hours as needed for mild pain. 08/10/21  Yes [name], MD  albuterol (VENTOLIN HFA) 108 (90 Base)  MCG/ACT inhaler Inhale 2 puffs into the lungs every 6 (six) hours as needed for wheezing or shortness of breath.   Yes [provider]  amLODipine (NORVASC) 10 MG tablet Take 10 mg by mouth daily.   Yes [provider]  aspirin EC 81 MG tablet Take 81 mg by mouth daily.   Yes [provider]  atorvastatin (LIPITOR) 40 MG tablet Take 40 mg by mouth daily.   Yes [provider]  benzonatate (TESSALON) 100 MG  capsule Take 100-200 mg by mouth every 8 (eight) hours as needed for cough. 07/27/23  Yes [provider]  cilostazol (PLETAL) 100 MG tablet Take 100 mg by mouth daily.   Yes [provider]  dorzolamide (TRUSOPT) 2 % ophthalmic solution Place 1 drop into both eyes 2 (two) times daily. 07/09/21  Yes [provider]  empagliflozin (JARDIANCE) 25 MG TABS tablet Take 25 mg by mouth daily.   Yes [provider]  glimepiride (AMARYL) 1 MG tablet Take 1 mg by mouth daily with breakfast.   Yes [provider]  hydrALAZINE (APRESOLINE) 50 MG tablet Take 50 mg by mouth 2 (two) times daily. 07/09/21  Yes [provider]  losartan (COZAAR) 100 MG tablet Take 1 tablet (100 mg total) by mouth daily. 08/10/21  Yes [name], MD  metoprolol tartrate (LOPRESSOR) 25 MG tablet Take 1 tablet (25 mg total) by mouth 2 (two) times daily. 08/13/23  Yes Lewie Chamber, MD  Multiple Vitamin (MULTIVITAMIN WITH MINERALS) TABS tablet Take 1 tablet by mouth daily.   Yes [provider]  nitroGLYCERIN (NITROSTAT) 0.4 MG SL tablet Place 1 tablet (0.4 mg total) under the tongue every 5 (five) minutes x 3 doses as needed for chest pain. 12/12/14  Yes [name], MD  pantoprazole (PROTONIX) 40 MG tablet Take 1 tablet (40 mg total) by mouth daily at 6 (six) AM. 08/11/21  Yes [name], MD  insulin glargine (LANTUS) 100 UNIT/ML Solostar Pen Inject 5 Units into the skin at bedtime. Patient not taking: Reported on 08/11/2023 08/10/21   [name], MD    Physical Exam: BP 137/69 (BP Location: Left Arm)   Pulse 87   Temp 98.3 F (36.8 C) (Oral)   Resp 20   Ht 5\' 5"  (1.651 m)   Wt 78.4 kg   SpO2 99%   BMI 28.76 kg/m   General: 82 y.o. year-old female well developed well nourished in no acute distress.  Alert and verbal with dysarthria.  Left facial droop noted. Cardiovascular: Regular rate and rhythm with no rubs or gallops.  No thyromegaly or JVD noted.  No  lower extremity edema. 2/4 pulses in all 4 extremities. Respiratory: Clear to auscultation with no wheezes or rales. Good inspiratory effort. Abdomen: Soft nontender nondistended with normal bowel sounds x4 quadrants. Muskuloskeletal: No cyanosis, clubbing or edema noted bilaterally Neuro: CN II-XII intact, strength, sensation, reflexes Skin: No ulcerative lesions noted or rashes Psychiatry: Judgement and insight appear normal. Mood is appropriate for condition and setting          Labs on Admission:  Basic Metabolic Panel: Recent Labs  Lab 08/29/23 1630 08/29/23 1637  NA 141 141  K 3.6 3.6  CL 107 109  CO2 20*  --   GLUCOSE 282* 283*  BUN 17 17  CREATININE 1.18* 1.20*  CALCIUM 9.5  --    Liver Function Tests: Recent Labs  Lab 08/29/23 1630  AST 29  ALT 30  ALKPHOS 118  BILITOT 0.7  PROT 6.9  ALBUMIN 3.9   No results for input(s): "LIPASE", "AMYLASE" in the last 168 hours. No results for input(s): "AMMONIA" in the last 168 hours. CBC: Recent Labs  Lab 08/29/23 1630 08/29/23 1637  WBC 8.0  --   NEUTROABS 6.0  --   HGB 12.7 12.9  HCT 38.6 38.0  MCV 97.7  --   PLT 196  --    Cardiac Enzymes: No results for input(s): "CKTOTAL", "CKMB", "CKMBINDEX", "TROPONINI" in the last 168 hours.  BNP (last 3 results) No results for input(s): "BNP" in the last 8760 hours.  ProBNP (last 3 results) No results for input(s): "PROBNP" in the last 8760 hours.  CBG: Recent Labs  Lab 08/29/23 1635  GLUCAP 261*    Radiological Exams on Admission: DG Chest 2 View  Result Date: 08/29/2023 CLINICAL DATA:  Altered mental status. Fell yesterday. Facial droop and slurred speech. Headache and generalized weakness. EXAM: CHEST - 2 VIEW COMPARISON:  08/11/2023 FINDINGS: Cardiac pacemaker. Heart size and pulmonary vascularity are normal. Lungs are clear. No pleural effusions. No pneumothorax. Mediastinal contours appear intact. Degenerative changes in the spine and shoulders.  IMPRESSION: No active cardiopulmonary disease. Electronically Signed   By: Burman Nieves M.D.   On: 08/29/2023 19:12   CT HEAD WO CONTRAST ( )  Result Date: 08/29/2023 CLINICAL DATA:  Minor head trauma.  Dizziness.  Poly trauma, blunt. EXAM: CT HEAD WITHOUT CONTRAST CT CERVICAL SPINE WITHOUT CONTRAST TECHNIQUE: Multidetector CT imaging of the head and cervical spine was performed following the standard protocol without intravenous contrast. Multiplanar CT image reconstructions of the cervical spine were also generated. RADIATION DOSE REDUCTION: This exam was performed according to the departmental dose-optimization program which includes automated exposure control, adjustment of the mA and/or kV according to patient size and/or use of iterative reconstruction technique. COMPARISON:  CT head 05/30/2014 FINDINGS: CT HEAD FINDINGS Brain: Diffuse cerebral atrophy. Mild ventricular dilatation consistent with central atrophy. Low-attenuation changes in the deep white matter consistent with small vessel ischemia. Old calcified extra-axial lesion along the anterior falx to the right measuring 0.8 x 1.9 cm. This is likely a meningioma. Mild enlargement since previous study. No mass-effect or midline shift. No abnormal extra-axial fluid collections. Gray-white matter junctions are distinct. Basal cisterns are not effaced. No acute intracranial hemorrhage. Vascular: No hyperdense vessel or unexpected calcification. Skull: Normal. Negative for fracture or focal lesion. Sinuses/Orbits: No acute finding. Other: None. CT CERVICAL SPINE FINDINGS Alignment: Normal alignment. Skull base and vertebrae: No acute fracture. No primary bone lesion or focal pathologic process. Soft tissues and spinal canal: No prevertebral fluid or swelling. No visible canal hematoma. Disc levels: Degenerative changes throughout with narrowed disc space interspaces and associated endplate osteophyte formation. Moderately prominent disc osteophyte  complexes at C2-3, C4-5, and C6-7 causing some encroachment upon the central canal. Uncovertebral and facet joint spurring causes some encroachment upon the neural foramina bilaterally. Upper chest: Lung apices are clear. Other: None. IMPRESSION: 1. No acute intracranial abnormalities. Chronic atrophy and small vessel ischemic changes. Small right parafalcine meningioma. 2. Normal alignment of the cervical spine. No acute displaced fractures identified. Diffuse degenerative change. Electronically Signed   By: Burman Nieves M.D.   On: 08/29/2023 19:10   CT CERVICAL SPINE WO CONTRAST  Result Date: 08/29/2023 CLINICAL DATA:  Minor head trauma.  Dizziness.  Poly trauma, blunt. EXAM: CT HEAD WITHOUT CONTRAST CT CERVICAL SPINE WITHOUT CONTRAST TECHNIQUE: Multidetector CT imaging of the head and cervical spine was performed  following the standard protocol without intravenous contrast. Multiplanar CT image reconstructions of the cervical spine were also generated. RADIATION DOSE REDUCTION: This exam was performed according to the departmental dose-optimization program which includes automated exposure control, adjustment of the mA and/or kV according to patient size and/or use of iterative reconstruction technique. COMPARISON:  CT head 05/30/2014 FINDINGS: CT HEAD FINDINGS Brain: Diffuse cerebral atrophy. Mild ventricular dilatation consistent with central atrophy. Low-attenuation changes in the deep white matter consistent with small vessel ischemia. Old calcified extra-axial lesion along the anterior falx to the right measuring 0.8 x 1.9 cm. This is likely a meningioma. Mild enlargement since previous study. No mass-effect or midline shift. No abnormal extra-axial fluid collections. Gray-white matter junctions are distinct. Basal cisterns are not effaced. No acute intracranial hemorrhage. Vascular: No hyperdense vessel or unexpected calcification. Skull: Normal. Negative for fracture or focal lesion.  Sinuses/Orbits: No acute finding. Other: None. CT CERVICAL SPINE FINDINGS Alignment: Normal alignment. Skull base and vertebrae: No acute fracture. No primary bone lesion or focal pathologic process. Soft tissues and spinal canal: No prevertebral fluid or swelling. No visible canal hematoma. Disc levels: Degenerative changes throughout with narrowed disc space interspaces and associated endplate osteophyte formation. Moderately prominent disc osteophyte complexes at C2-3, C4-5, and C6-7 causing some encroachment upon the central canal. Uncovertebral and facet joint spurring causes some encroachment upon the neural foramina bilaterally. Upper chest: Lung apices are clear. Other: None. IMPRESSION: 1. No acute intracranial abnormalities. Chronic atrophy and small vessel ischemic changes. Small right parafalcine meningioma. 2. Normal alignment of the cervical spine. No acute displaced fractures identified. Diffuse degenerative change. Electronically Signed   By: Burman Nieves M.D.   On: 08/29/2023 19:10    EKG: I independently viewed the EKG done and my findings are as followed: Ventricular paced rate of 98.  Nonspecific ST-T changes.  QTc 561.  Assessment/Plan Present on Admission:  Stroke [facility])  Principal Problem:   Stroke [facility])  Presumptive CVA Presented with left facial and left-sided weakness. Noncontrast CT head nonacute Admitted for stroke workup Follow MRI brain CTA head and neck 2D echo, most recent 08/12/23 Fasting lipid panel, A1c Frequent neurochecks Continue home aspirin as recommended by neurology PT/OT/speech therapist evaluation Fall and aspiration precautions  Prolonged QTC QTC 561 Avoid QTC prologing agents Optimize potassium and magnesium levels Monitor on telemetry  Type 2 diabetes with hyperglycemia Hemoglobin A1c, most recent 10.1 on 08/12/23. Start insulin sliding scale  CKD 3B At baseline. Above nephrotoxic agent dehydration and  hypotension.  HFpEF Euvolemic on exam Closely monitor strict I's and O's and daily weight while on IV fluid Resume home medications  Generalized weakness Frequent falls PT OT assessment Fall precautions   Time: 75 minutes.   DVT prophylaxis: Subcu Lovenox daily  Code Status: Full code  Family Communication: None at bedside.  Updated her daughter Sue Lush via phone.  Disposition Plan: Admitted to telemetry medical unit  Consults called: Neurology/stroke team  Admission status: Inpatient status.   Status is: Inpatient The patient requires at least 2 midnights for further evaluation and treatment of present condition.   Darlin Drop MD [facility] Pager [phone]  If 7PM-7AM, please contact night-coverage [email or web address] Password [email or web address]  08/29/2023, 11:14 PM

## 2023-08-29 NOTE — ED Notes (Signed)
Patient transported to CT 

## 2023-08-29 NOTE — Consult Note (Signed)
Neurology Consultation  Reason for Consult: left sided weakness, facial droop Referring Physician: Glyn Ade, MD  CC: Left-sided weakness, left-sided facial droop  History is obtained from: Patient, chart  HPI: Amanda Stewart is a 82 y.o. female past medical history of coronary artery disease, diabetes, hypertension, hyperlipidemia, peripheral artery disease on aspirin and cilostazol at home presented to the emergency department for evaluation of multiple falls and facial droop as well as slurred speech that has been ongoing for a few days to few weeks according to the patient. Last fall was at 5 PM yesterday.  Daughter noticed a facial droop since then and slurred speech.  She also has been complaining of headache and generalized weakness.  She had another fall today with some dizziness.  Patient has a pacemaker in place since 2022 and cannot get an MRI until the morning. Patient complains of left-sided facial weakness, slurred speech and left arm and leg weakness and tingling as well as weakness in the right leg as well.  She also reports that she cannot hold things in her left hand and that has been going on for a couple of weeks. Neurology was consulted due to very focal left-sided symptoms concerning for stroke.   LKW: Multiple days ago-unclear IV thrombolysis given?: no, unclear last known well EVT: No-unclear last known well Premorbid modified Rankin scale (mRS): 2-3   ROS: Full ROS was performed and is negative except as noted in the HPI.   Past Medical History:  Diagnosis Date   Allergic rhinitis    Anemia    Arrhythmia    CAD (coronary artery disease)    Chest pain    Chronic kidney disease, stage 3a (HCC) 08/11/2023   Combined systolic and diastolic heart failure (HCC)    Diabetes mellitus without complication (HCC)    Diabetic peripheral neuropathy (HCC)    GERD (gastroesophageal reflux disease)    Heart block    Hyperlipidemia    Hypertension    Left bundle  branch block    Low back pain    Myocardial infarction (HCC)    Peripheral arterial disease (HCC)    Retinopathy    Family History  Problem Relation Age of Onset   Diabetes Mother    Diabetes Father    Stroke Father    Social History:   reports that she has never smoked. She has never used smokeless tobacco. She reports current alcohol use of about 1.0 standard drink of alcohol per week. She reports that she does not use drugs.  Medications  Current Facility-Administered Medications:    sodium chloride flush (NS) 0.9 % injection 3 mL, 3 mL, Intravenous, Once, Countryman, [place], MD  Current Outpatient Medications:    acetaminophen (TYLENOL) 325 MG tablet, Take 1-2 tablets (325-650 mg total) by mouth every 4 (four) hours as needed for mild pain. (Patient taking differently: Take 325 mg by mouth every 4 (four) hours as needed for mild pain.), Disp: , Rfl:    albuterol (VENTOLIN HFA) 108 (90 Base) MCG/ACT inhaler, Inhale 2 puffs into the lungs every 6 (six) hours as needed for wheezing or shortness of breath., Disp: , Rfl:    amLODipine (NORVASC) 10 MG tablet, Take 10 mg by mouth daily., Disp: , Rfl:    aspirin EC 81 MG tablet, Take 81 mg by mouth daily., Disp: , Rfl:    atorvastatin (LIPITOR) 40 MG tablet, Take 40 mg by mouth daily., Disp: , Rfl:    benzonatate (TESSALON) 100 MG capsule, Take 100-200  mg by mouth every 8 (eight) hours as needed for cough., Disp: , Rfl:    cilostazol (PLETAL) 100 MG tablet, Take 100 mg by mouth daily., Disp: , Rfl:    dorzolamide (TRUSOPT) 2 % ophthalmic solution, Place 1 drop into both eyes 2 (two) times daily., Disp: , Rfl:    empagliflozin (JARDIANCE) 25 MG TABS tablet, Take 25 mg by mouth daily., Disp: , Rfl:    glimepiride (AMARYL) 1 MG tablet, Take 1 mg by mouth daily with breakfast., Disp: , Rfl:    hydrALAZINE (APRESOLINE) 50 MG tablet, Take 50 mg by mouth 2 (two) times daily., Disp: , Rfl:    losartan (COZAAR) 100 MG tablet, Take 1 tablet (100 mg  total) by mouth daily., Disp: 30 tablet, Rfl: 3   metoprolol tartrate (LOPRESSOR) 25 MG tablet, Take 1 tablet (25 mg total) by mouth 2 (two) times daily., Disp: 60 tablet, Rfl: 3   Multiple Vitamin (MULTIVITAMIN WITH MINERALS) TABS tablet, Take 1 tablet by mouth daily., Disp: , Rfl:    nitroGLYCERIN (NITROSTAT) 0.4 MG SL tablet, Place 1 tablet (0.4 mg total) under the tongue every 5 (five) minutes x 3 doses as needed for chest pain., Disp: 25 tablet, Rfl: 1   pantoprazole (PROTONIX) 40 MG tablet, Take 1 tablet (40 mg total) by mouth daily at 6 (six) AM., Disp: 30 tablet, Rfl: 3   insulin glargine (LANTUS) 100 UNIT/ML Solostar Pen, Inject 5 Units into the skin at bedtime. (Patient not taking: Reported on 08/11/2023), Disp: 15 mL, Rfl: 1   Exam: Current vital signs: BP 137/69 (BP Location: Left Arm)   Pulse 87   Temp 98.3 F (36.8 C) (Oral)   Resp 20   Ht 5\' 5"  (1.651 m)   Wt 78.4 kg   SpO2 99%   BMI 28.76 kg/m  Vital signs in last 24 hours: Temp:  [98.3 F (36.8 C)-98.6 F (37 C)] 98.3 F (36.8 C) (10/02 1937) Pulse Rate:  [87-98] 87 (10/02 1937) Resp:  [15-20] 20 (10/02 1937) BP: (137-140)/(69-102) 137/69 (10/02 1937) SpO2:  [98 %-99 %] 99 % (10/02 1937) Weight:  [78.4 kg] 78.4 kg (10/02 1621) GENERAL: Awake, alert in NAD HEENT: - Normocephalic and atraumatic, dry mm, no LN++, no Thyromegally LUNGS - Clear to auscultation bilaterally with no wheezes CV - S1S2 RRR, no m/r/g, equal pulses bilaterally. ABDOMEN - Soft, nontender, nondistended with normoactive BS NEURO:  Mental Status: AA&Ox3  Language: speech is mildly dysarthric.  Naming, repetition, fluency, and comprehension intact. Cranial Nerves: PERRL EOMI, visual fields full, left lower facial weakness at rest and on smiling, facial sensation intact, hearing intact, tongue/uvula/soft palate midline, normal sternocleidomastoid and trapezius muscle strength. No evidence of tongue atrophy or fibrillations Motor: Mild drift in  the left upper extremity.  Otherwise no drift. Tone: is normal and bulk is normal Sensation-diminished light touch sensation on the left Coordination: FTN intact bilaterally Gait- deferred  NIHSS 1a Level of Conscious.: 0 1b LOC Questions: 0 1c LOC Commands: 0 2 Best Gaze: 0 3 Visual: 0 4 Facial Palsy: 1 5a Motor Arm - left: 1 5b Motor Arm - Right: 0 6a Motor Leg - Left: 0 6b Motor Leg - Right: 0 7 Limb Ataxia: 0 8 Sensory: 1 9 Best Language: 0 10 Dysarthria: 1 11 Extinct. and Inatten.: 0 TOTAL: 4  Labs I have reviewed labs in epic and the results pertinent to this consultation are: CBC    Component Value Date/Time   WBC 8.0 08/29/2023 1630  RBC 3.95 08/29/2023 1630   HGB 12.9 08/29/2023 1637   HCT 38.0 08/29/2023 1637   PLT 196 08/29/2023 1630   MCV 97.7 08/29/2023 1630   MCH 32.2 08/29/2023 1630   MCHC 32.9 08/29/2023 1630   RDW 14.6 08/29/2023 1630   LYMPHSABS 1.4 08/29/2023 1630   MONOABS 0.5 08/29/2023 1630   EOSABS 0.0 08/29/2023 1630   BASOSABS 0.1 08/29/2023 1630    CMP     Component Value Date/Time   NA 141 08/29/2023 1637   K 3.6 08/29/2023 1637   CL 109 08/29/2023 1637   CO2 20 (L) 08/29/2023 1630   GLUCOSE 283 (H) 08/29/2023 1637   BUN 17 08/29/2023 1637   CREATININE 1.20 (H) 08/29/2023 1637   CREATININE 1.25 (H) 12/22/2022 0000   CALCIUM 9.5 08/29/2023 1630   PROT 6.9 08/29/2023 1630   ALBUMIN 3.9 08/29/2023 1630   AST 29 08/29/2023 1630   ALT 30 08/29/2023 1630   ALKPHOS 118 08/29/2023 1630   BILITOT 0.7 08/29/2023 1630   GFRNONAA 46 (L) 08/29/2023 1630   GFRNONAA 59 (L) 01/05/2021 0948   GFRAA 69 01/05/2021 0948    Lipid Panel     Component Value Date/Time   CHOL 187 12/22/2022 0000   TRIG 106 12/22/2022 0000   HDL 61 12/22/2022 0000   CHOLHDL 3.1 12/22/2022 0000   VLDL 12 08/07/2021 0634   LDLCALC 106 (H) 12/22/2022 0000    Lab Results  Component Value Date   HGBA1C 10.1 (H) 08/12/2023      Imaging I have reviewed  the images obtained:  CT-head-no acute intracranial abnormality.  Chronic atrophy and small vessel ischemic changes.  Small right parafalcine meningioma.  CT C-spine unremarkable  Assessment:  82 year old woman with past history of CAD diabetes hypertension hyperlipidemia peripheral artery disease on aspirin and cilostazol as well as a pacemaker presenting for evaluation of multiple falls and left-sided weakness. I suspect she might have had a small vessel stroke which is causing these focal left-sided symptoms in addition to the deconditioning that is causing overall generalized weakness. She has a pacemaker put in 2022-likely compatible but MRI cannot be done till the morning till the technologist/rep from the pacer company is available. I would recommend admission for stroke risk factor workup.  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  Also okay to continue home cilostazol. Interrogate pacemaker CT angio head and neck 2D echo A1c Lipid panel PT OT speech therapy No need for permissive hypertension-last known well as multiple days ago.  Goal blood pressure is normotension Stroke team will follow with you  Plan discussed with Dr. Doran Durand, EDP  -- Milon Dikes, MD Neurologist [facility] Pager: [phone]

## 2023-08-29 NOTE — ED Triage Notes (Signed)
Pt had a fall yesterday at 1700. Daughter noticed facial droop and slurred speech. Pt complains of headache and generalized weakness. Pt had another fall again today due to dizziness. Alert and oriented x 4. Pacemaker in place.

## 2023-08-30 ENCOUNTER — Inpatient Hospital Stay (HOSPITAL_COMMUNITY): Payer: 59

## 2023-08-30 DIAGNOSIS — I63231 Cerebral infarction due to unspecified occlusion or stenosis of right carotid arteries: Secondary | ICD-10-CM | POA: Diagnosis not present

## 2023-08-30 DIAGNOSIS — I639 Cerebral infarction, unspecified: Secondary | ICD-10-CM | POA: Diagnosis not present

## 2023-08-30 LAB — GLUCOSE, CAPILLARY
Glucose-Capillary: 219 mg/dL — ABNORMAL HIGH (ref 70–99)
Glucose-Capillary: 178 mg/dL — ABNORMAL HIGH (ref 70–99)
Glucose-Capillary: 120 mg/dL — ABNORMAL HIGH (ref 70–99)

## 2023-08-30 LAB — LIPID PANEL
Cholesterol: 138 mg/dL (ref 0–200)
HDL: 54 mg/dL (ref >40)
LDL Cholesterol: 68 mg/dL (ref 0–99)
Total CHOL/HDL Ratio: 2.6 {ratio}
Triglycerides: 82 mg/dL (ref <150)
VLDL: 16 mg/dL (ref 0–40)

## 2023-08-30 LAB — CBG MONITORING, ED
Glucose-Capillary: 139 mg/dL — ABNORMAL HIGH (ref 70–99)
Glucose-Capillary: 149 mg/dL — ABNORMAL HIGH (ref 70–99)
Glucose-Capillary: 94 mg/dL (ref 70–99)
Glucose-Capillary: 97 mg/dL (ref 70–99)

## 2023-08-30 MED ORDER — ATORVASTATIN CALCIUM 80 MG PO TABS
80.0000 mg | ORAL_TABLET | Freq: Every day | ORAL | Status: DC
  Administered 2023-08-31 – 2023-09-07 (×8): 80 mg via ORAL
  Filled 2023-08-30 (×8): qty 1

## 2023-08-30 MED ORDER — HYDRALAZINE HCL 20 MG/ML IJ SOLN: 10.0000 mg | Freq: Four times a day (QID) | INTRAMUSCULAR | Status: DC | PRN

## 2023-08-30 MED ORDER — CLOPIDOGREL BISULFATE 75 MG PO TABS
75.0000 mg | ORAL_TABLET | Freq: Every day | ORAL | Status: DC
  Administered 2023-08-30 – 2023-09-07 (×9): 75 mg via ORAL
  Filled 2023-08-30 (×9): qty 1

## 2023-08-30 MED ORDER — IOHEXOL 350 MG/ML SOLN
75.0000 mL | Freq: Once | INTRAVENOUS | Status: AC | PRN
  Administered 2023-08-30: 75 mL via INTRAVENOUS

## 2023-08-30 NOTE — Progress Notes (Signed)
SLP Cancellation Note  Patient Details Name: Amanda Stewart MRN: [id number] DOB: July 13, 1941   Cancelled treatment:       Reason Eval/Treat Not Completed: Patient at procedure or test/unavailable; BSE attempted x2 with pt off floor for MRI completion.  ST will continue efforts.   Pat Conor Filsaime,M.S.,CCC-SLP 08/30/2023, 12:25 PM

## 2023-08-30 NOTE — Progress Notes (Addendum)
PROGRESS NOTE    Amanda Stewart  ZOX:[id number] DOB: Mar 17, 1941 DOA: 08/29/2023 PCP: Fleet Contras, MD    Brief Narrative:    Amanda Stewart is a 82 y.o. female with medical history significant for coronary artery disease, chronic diastolic CHF, type 2 diabetes, diabetic polyneuropathy, hyperlipidemia, hypertension, LBBB, status post pacemaker placement, peripheral artery disease on Aspirin and Pletal, CKD 3B, who presented from home after multiple falls and generalized weakness.  Patient daughter noted left facial droop and slurred speech 1 day prior to presentation.  Patient was recently admitted from 08/11/2023 through 08/13/2023 due to generalized weakness, dizziness, found to have orthostatic hypotension, was discharged to home with home health PT OT.  In the ED on this presentation, patient was seen by neurology and stroke team.  CT head and cervical spine was negative.  Vitals were stable.  Labs remarkable for elevated glucose at 223 creatinine of 1.2.  Patient was then considered for admission to hospital for further evaluation and treatment.  Assessment and plan.  Presumptive CVA Had acute left facial and left-sided weakness.  CT head negative.  Admitted for stroke workup.  CT angiogram of the head and neck without any intracranial occlusion.  MRI of the brain pending. 2D echo, most recent 08/12/23 showed LV ejection fraction of 65 to 70% with LVH.  Lipid panel within normal range.Frequent neurochecks.  Continue aspirin.  Continue PT OT speech therapy fall and aspiration precautions.  History of pacemaker placement.  Interrogation underway.  Cardiology been notified.   Prolonged QTC QTC 561. Avoid QTC prologing agents Continue to monitor electrolytes.   Type 2 diabetes with hyperglycemia Hemoglobin A1c, most recent 10.1 on 08/12/23.  Continue diabetic diet, insulin sliding scale   CKD 3B At baseline.  Creatinine on presentation was 1.1.   HFpEF Continue intake and output charting Daily  weights.  Recent 2D echocardiogram with preserved LV function.   Generalized weakness Frequent falls PT OT assessment Fall precautions       DVT prophylaxis: enoxaparin (LOVENOX) injection 40 mg Start: 08/29/23 2315   Code Status:     Code Status: Full Code  Disposition: Uncertain at the time of get PT OT evaluation.  Status is: Inpatient  Remains inpatient appropriate because: Suspected stroke, workup underway   Family Communication: Spoke with the daughter on the phone.   Consultants:  Neurology  Procedures:  Pacemaker interrogation  Antimicrobials:  None  Anti-infectives (From admission, onward)    None       Subjective: Today, patient was seen and examined at bedside.  Complains of a headache dizziness lightheadedness.  Objective: Vitals:   08/30/23 0500 08/30/23 0600 08/30/23 0700 08/30/23 1017  BP: (!) 138/46 (!) 117/54 109/66 (!) 147/80  Pulse: 80 87 78 61  Resp: 17 17 16 19   Temp:    98.3 F (36.8 C)  TempSrc:    Oral  SpO2: 95% 96% 96% 99%  Weight:      Height:        Intake/Output Summary (Last 24 hours) at 08/30/2023 1027 Last data filed at 08/29/2023 2202 Gross per 24 hour  Intake 1000 ml  Output --  Net 1000 ml   Filed Weights   08/29/23 1621  Weight: 78.4 kg    Physical Examination: Body mass index is 28.76 kg/m.  General:  Average built, not in obvious distress, hard of hearing, elderly female, Communicative HENT:   No scleral pallor or icterus noted. Oral mucosa is moist.  Chest:  .  Diminished  breath sounds bilaterally. No crackles or wheezes.  CVS: S1 &S2 heard. No murmur.  Regular rate and rhythm. Abdomen: Soft, nontender, nondistended.  Bowel sounds are heard.   Extremities: No cyanosis, clubbing or edema.  Peripheral pulses are palpable. Psych: Alert, awake and oriented, normal mood CNS: Left-sided facial droop, mild expressive aphasia, moves all extremities Skin: Warm and dry.  No rashes noted.  Data Reviewed:    CBC: Recent Labs  Lab 08/29/23 1630 08/29/23 1637  WBC 8.0  --   NEUTROABS 6.0  --   HGB 12.7 12.9  HCT 38.6 38.0  MCV 97.7  --   PLT 196  --     Basic Metabolic Panel: Recent Labs  Lab 08/29/23 1630 08/29/23 1637  NA 141 141  K 3.6 3.6  CL 107 109  CO2 20*  --   GLUCOSE 282* 283*  BUN 17 17  CREATININE 1.18* 1.20*  CALCIUM 9.5  --     Liver Function Tests: Recent Labs  Lab 08/29/23 1630  AST 29  ALT 30  ALKPHOS 118  BILITOT 0.7  PROT 6.9  ALBUMIN 3.9     Radiology Studies: CT ANGIO HEAD NECK W WO CM  Result Date: 08/30/2023 CLINICAL DATA:  Dizziness EXAM: CT ANGIOGRAPHY HEAD AND NECK WITH AND WITHOUT CONTRAST TECHNIQUE: Multidetector CT imaging of the head and neck was performed using the standard protocol during bolus administration of intravenous contrast. Multiplanar CT image reconstructions and MIPs were obtained to evaluate the vascular anatomy. Carotid stenosis measurements (when applicable) are obtained utilizing NASCET criteria, using the distal internal carotid diameter as the denominator. RADIATION DOSE REDUCTION: This exam was performed according to the departmental dose-optimization program which includes automated exposure control, adjustment of the mA and/or kV according to patient size and/or use of iterative reconstruction technique. CONTRAST:  75mL OMNIPAQUE IOHEXOL 350 MG/ML SOLN COMPARISON:  None Available. FINDINGS: CTA NECK FINDINGS SKELETON: No acute abnormality or high grade bony spinal canal stenosis. OTHER NECK: Normal pharynx, larynx and major salivary glands. No cervical lymphadenopathy. Unremarkable thyroid gland. UPPER CHEST: No pneumothorax or pleural effusion. No nodules or masses. AORTIC ARCH: There is calcific atherosclerosis of the aortic arch. Conventional 3 vessel aortic branching pattern. RIGHT CAROTID SYSTEM: There is bulky calcific atherosclerosis at the right carotid bifurcation with an angiographic string sign of the proximal  ICA. The ICA remains patent distally. LEFT CAROTID SYSTEM: Mild atherosclerosis within the common carotid artery and at the carotid bifurcation. No hemodynamically significant stenosis. VERTEBRAL ARTERIES: Left dominant configuration. There is moderate stenosis of the left vertebral artery at the C4 level. 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 The right vertebral artery is markedly diminutive along its entire course with minimal opacification. CTA HEAD FINDINGS POSTERIOR CIRCULATION: --Vertebral arteries: The left V4 segment is normal aside from mild calcification. The right V4 segment is non-opacified. --Inferior cerebellar arteries: Normal. --Basilar artery: Normal. --Superior cerebellar arteries: Normal. --Posterior cerebral arteries (PCA): Normal. ANTERIOR CIRCULATION: --Intracranial internal carotid arteries: Atherosclerotic calcification of the internal carotid arteries at the skull base without hemodynamically significant stenosis. --Anterior cerebral arteries (ACA): Normal. --Middle cerebral arteries (MCA): Normal. VENOUS SINUSES: As permitted by contrast timing, patent. ANATOMIC VARIANTS: None Review of the MIP images confirms the above findings. IMPRESSION: 1. No intracranial arterial occlusion or high-grade stenosis. 2. Bulky calcific atherosclerosis at the right carotid bifurcation with an angiographic string sign of the proximal ICA. The ICA remains patent distally. 3. 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 Moderate stenosis of the V2 segment at the C4 level. 4. Non-dominant, diminutive right vertebral artery is poorly opacified and occluded at multiple locations along its course. No visible opacification of the V4 segment. Aortic Atherosclerosis (ICD10-I70.0). Electronically Signed   By: Deatra Robinson M.D.   On: 08/30/2023 03:39   DG Chest 2 View  Result Date: 08/29/2023 CLINICAL  DATA:  Altered mental status. Fell yesterday. Facial droop and slurred speech. Headache and generalized weakness. EXAM: CHEST - 2 VIEW COMPARISON:  08/11/2023 FINDINGS: Cardiac pacemaker. Heart size and pulmonary vascularity are normal. Lungs are clear. No pleural effusions. No pneumothorax. Mediastinal contours appear intact. Degenerative changes in the spine and shoulders. IMPRESSION: No active cardiopulmonary disease. Electronically Signed   By: Burman Nieves M.D.   On: 08/29/2023 19:12   CT HEAD WO CONTRAST ( )  Result Date: 08/29/2023 CLINICAL DATA:  Minor head trauma.  Dizziness.  Poly trauma, blunt. EXAM: CT HEAD WITHOUT CONTRAST CT CERVICAL SPINE WITHOUT CONTRAST TECHNIQUE: Multidetector CT imaging of the head and cervical spine was performed following the standard protocol without intravenous contrast. Multiplanar CT image reconstructions of the cervical spine were also generated. RADIATION DOSE REDUCTION: This exam was performed according to the departmental dose-optimization program which includes automated exposure control, adjustment of the mA and/or kV according to patient size and/or use of iterative reconstruction technique. COMPARISON:  CT head 05/30/2014 FINDINGS: CT HEAD FINDINGS Brain: Diffuse cerebral atrophy. Mild ventricular dilatation consistent with central atrophy. Low-attenuation changes in the deep white matter consistent with small vessel ischemia. Old calcified extra-axial lesion along the anterior falx to the right measuring 0.8 x 1.9 cm. This is likely a meningioma. Mild enlargement since previous study. No mass-effect or midline shift. No abnormal extra-axial fluid collections. Gray-white matter junctions are distinct. Basal cisterns are not effaced. No acute intracranial hemorrhage. Vascular: No hyperdense vessel or unexpected calcification. Skull: Normal. Negative for fracture or focal lesion. Sinuses/Orbits: No acute finding. Other: None. CT CERVICAL SPINE FINDINGS  Alignment: Normal alignment. Skull base and vertebrae: No acute fracture. No primary bone lesion or focal pathologic process. Soft tissues and spinal canal: No prevertebral fluid or swelling. No visible canal hematoma. Disc levels: Degenerative changes throughout with narrowed disc space interspaces and associated endplate osteophyte formation. Moderately prominent disc osteophyte complexes at C2-3, C4-5, and C6-7 causing some encroachment upon the central canal. Uncovertebral and facet joint spurring causes some encroachment upon the neural foramina bilaterally. Upper chest: Lung apices are clear. Other: None. IMPRESSION: 1. No acute intracranial abnormalities. Chronic atrophy and small vessel ischemic changes. Small right parafalcine meningioma. 2. Normal alignment of the cervical spine. No acute displaced fractures identified. Diffuse degenerative change. Electronically Signed   By: Burman Nieves M.D.   On: 08/29/2023 19:10   CT CERVICAL SPINE WO CONTRAST  Result Date: 08/29/2023 CLINICAL DATA:  Minor head trauma.  Dizziness.  Poly trauma, blunt. EXAM: CT HEAD WITHOUT CONTRAST CT CERVICAL SPINE WITHOUT CONTRAST TECHNIQUE: Multidetector CT imaging of the head and cervical spine was performed following the standard protocol without intravenous contrast. Multiplanar CT image reconstructions of the cervical spine were also generated. RADIATION DOSE REDUCTION: This exam was performed according to the departmental dose-optimization program which includes automated exposure control, adjustment of the mA and/or kV according to patient size and/or use of iterative reconstruction technique. COMPARISON:  CT head 05/30/2014 FINDINGS: CT HEAD FINDINGS Brain: Diffuse cerebral atrophy. Mild ventricular dilatation consistent with  central atrophy. Low-attenuation changes in the deep white matter consistent with small vessel ischemia. Old calcified extra-axial lesion along the anterior falx to the right measuring 0.8 x 1.9  cm. This is likely a meningioma. Mild enlargement since previous study. No mass-effect or midline shift. No abnormal extra-axial fluid collections. Gray-white matter junctions are distinct. Basal cisterns are not effaced. No acute intracranial hemorrhage. Vascular: No hyperdense vessel or unexpected calcification. Skull: Normal. Negative for fracture or focal lesion. Sinuses/Orbits: No acute finding. Other: None. CT CERVICAL SPINE FINDINGS Alignment: Normal alignment. Skull base and vertebrae: No acute fracture. No primary bone lesion or focal pathologic process. Soft tissues and spinal canal: No prevertebral fluid or swelling. No visible canal hematoma. Disc levels: Degenerative changes throughout with narrowed disc space interspaces and associated endplate osteophyte formation. Moderately prominent disc osteophyte complexes at C2-3, C4-5, and C6-7 causing some encroachment upon the central canal. Uncovertebral and facet joint spurring causes some encroachment upon the neural foramina bilaterally. Upper chest: Lung apices are clear. Other: None. IMPRESSION: 1. No acute intracranial abnormalities. Chronic atrophy and small vessel ischemic changes. Small right parafalcine meningioma. 2. Normal alignment of the cervical spine. No acute displaced fractures identified. Diffuse degenerative change. Electronically Signed   By: Burman Nieves M.D.   On: 08/29/2023 19:10      LOS: 1 day    Joycelyn Das, MD [facility] Available via Epic secure chat 7am-7pm After these hours, please refer to coverage provider listed on [email or web address] 08/30/2023, 10:27 AM

## 2023-08-30 NOTE — ED Notes (Signed)
ED TO INPATIENT HANDOFF REPORT  ED Nurse Name and Phone #: Rodney Booze [phone]  S Name/Age/Gender Amanda Stewart 82 y.o. female Room/Bed: 042C/042C  Code Status   Code Status: Full Code  Home/SNF/Other Home Patient oriented to: self, place, time, and situation Is this baseline? Yes   Triage Complete: Triage complete  Chief Complaint Stroke [facility]) [I63.9]  Triage Note Pt had a fall yesterday at 1700. Daughter noticed facial droop and slurred speech. Pt complains of headache and generalized weakness. Pt had another fall again today due to dizziness. Alert and oriented x 4. Pacemaker in place.    Allergies Allergies  Allergen Reactions   Metformin And Related Other (See Comments)    Diarrhea     Level of Care/Admitting Diagnosis ED Disposition     ED Disposition  Admit   Condition  --   Comment  Hospital Area: [facility] [[id number]]  Level of Care: Telemetry Medical [104]  May admit patient to [facility] or [facility] if equivalent level of care is available:: No  Covid Evaluation: Asymptomatic - no recent exposure (last 10 days) testing not required  Diagnosis: Stroke [facility]) [[id number]]  Admitting Physician: Darlin Drop [[id number]]  Attending Physician: Darlin Drop [[id number]]  Certification:: I certify this patient will need inpatient services for at least 2 midnights  Expected Medical Readiness: 08/31/2023          B Medical/Surgery History Past Medical History:  Diagnosis Date   Allergic rhinitis    Anemia    Arrhythmia    CAD (coronary artery disease)    Chest pain    Chronic kidney disease, stage 3a (HCC) 08/11/2023   Combined systolic and diastolic heart failure (HCC)    Diabetes mellitus without complication (HCC)    Diabetic peripheral neuropathy (HCC)    GERD (gastroesophageal reflux disease)    Heart block    Hyperlipidemia    Hypertension    Left bundle branch block    Low back pain    Myocardial infarction (HCC)    Peripheral  arterial disease (HCC)    Retinopathy    Past Surgical History:  Procedure Laterality Date   ABDOMINAL HYSTERECTOMY     LEFT AND RIGHT HEART CATHETERIZATION WITH CORONARY ANGIOGRAM N/A 05/26/2014   Procedure: LEFT AND RIGHT HEART CATHETERIZATION WITH CORONARY ANGIOGRAM;  Surgeon: [name], MD;  Location: [facility] CATH LAB;  Service: Cardiovascular;  Laterality: N/A;   PACEMAKER IMPLANT N/A 08/08/2021   Procedure: PACEMAKER IMPLANT;  Surgeon: Lanier Prude, MD;  Location: [facility] INVASIVE CV LAB;  Service: Cardiovascular;  Laterality: N/A;   TEMPORARY PACEMAKER N/A 08/07/2021   Procedure: TEMPORARY PACEMAKER;  Surgeon: Rinaldo Cloud, MD;  Location: [facility] INVASIVE CV LAB;  Service: Cardiovascular;  Laterality: N/A;     A IV Location/Drains/Wounds Patient Lines/Drains/Airways Status     Active Line/Drains/Airways     Name Placement date Placement time Site Days   Peripheral IV 08/29/23 22 G 1" Right Forearm 08/29/23  1936  Forearm  1   Peripheral IV 08/30/23 20 G 1.88" Left Antecubital 08/30/23  0247  Antecubital  less than 1            Intake/Output Last 24 hours  Intake/Output Summary (Last 24 hours) at 08/30/2023 1544 Last data filed at 08/29/2023 2202 Gross per 24 hour  Intake 1000 ml  Output --  Net 1000 ml    Labs/Imaging Results for orders placed or performed during the hospital encounter of 08/29/23 (from the  past 48 hour(s))  Protime-INR     Status: None   Collection Time: 08/29/23  4:30 PM  Result Value Ref Range   Prothrombin Time 12.8 11.4 - 15.2 seconds   INR 0.9 0.8 - 1.2    Comment: (NOTE) INR goal varies based on device and disease states. Performed at [facility], [address] [place], [place] [address]   APTT     Status: None   Collection Time: 08/29/23  4:30 PM  Result Value Ref Range   aPTT 24 24 - 36 seconds    Comment: Performed at [facility], [address], [place], [place] [address]  CBC     Status: None   Collection Time:  08/29/23  4:30 PM  Result Value Ref Range   WBC 8.0 4.0 - 10.5 K/uL   RBC 3.95 3.87 - 5.11 MIL/uL   Hemoglobin 12.7 12.0 - 15.0 g/dL   HCT 09.8 11.9 - 14.7 %   MCV 97.7 80.0 - 100.0 fL   MCH 32.2 26.0 - 34.0 pg   MCHC 32.9 30.0 - 36.0 g/dL   RDW 82.9 56.2 - 13.0 %   Platelets 196 150 - 400 K/uL   nRBC 0.0 0.0 - 0.2 %    Comment: Performed at [facility], [address] [place], [place] [address]  Differential     Status: None   Collection Time: 08/29/23  4:30 PM  Result Value Ref Range   Neutrophils Relative % 74 %   Neutro Abs 6.0 1.7 - 7.7 K/uL   Lymphocytes Relative 17 %   Lymphs Abs 1.4 0.7 - 4.0 K/uL   Monocytes Relative 6 %   Monocytes Absolute 0.5 0.1 - 1.0 K/uL   Eosinophils Relative 1 %   Eosinophils Absolute 0.0 0.0 - 0.5 K/uL   Basophils Relative 1 %   Basophils Absolute 0.1 0.0 - 0.1 K/uL   Immature Granulocytes 1 %   Abs Immature Granulocytes 0.07 0.00 - 0.07 K/uL    Comment: Performed at [facility], [address], [place], [place] [address]  Comprehensive metabolic panel     Status: Abnormal   Collection Time: 08/29/23  4:30 PM  Result Value Ref Range   Sodium 141 135 - 145 mmol/L   Potassium 3.6 3.5 - 5.1 mmol/L   Chloride 107 98 - 111 mmol/L   CO2 20 (L) 22 - 32 mmol/L   Glucose, Bld 282 (H) 70 - 99 mg/dL    Comment: Glucose reference range applies only to samples taken after fasting for at least 8 hours.   BUN 17 8 - 23 mg/dL   Creatinine, Ser 9.52 (H) 0.44 - 1.00 mg/dL   Calcium 9.5 8.9 - 84.1 mg/dL   Total Protein 6.9 6.5 - 8.1 g/dL   Albumin 3.9 3.5 - 5.0 g/dL   AST 29 15 - 41 U/L   ALT 30 0 - 44 U/L   Alkaline Phosphatase 118 38 - 126 U/L   Total Bilirubin 0.7 0.3 - 1.2 mg/dL   GFR, Estimated 46 (L) >60 mL/min    Comment: (NOTE) Calculated using the CKD-EPI Creatinine Equation (2021)    Anion gap 14 5 - 15    Comment: Performed at [facility], [address] [place], [place] [address]  Ethanol     Status: None    Collection Time: 08/29/23  4:30 PM  Result Value Ref Range   Alcohol, Ethyl (B) <10 <10  mg/dL    Comment: (NOTE) Lowest detectable limit for serum alcohol is 10 mg/dL.  For medical purposes only. Performed at [facility], [address] [place], [place] [address]   CBG monitoring, ED     Status: Abnormal   Collection Time: 08/29/23  4:35 PM  Result Value Ref Range   Glucose-Capillary 261 (H) 70 - 99 mg/dL    Comment: Glucose reference range applies only to samples taken after fasting for at least 8 hours.  I-stat chem 8, ED     Status: Abnormal   Collection Time: 08/29/23  4:37 PM  Result Value Ref Range   Sodium 141 135 - 145 mmol/L   Potassium 3.6 3.5 - 5.1 mmol/L   Chloride 109 98 - 111 mmol/L   BUN 17 8 - 23 mg/dL   Creatinine, Ser 6.04 (H) 0.44 - 1.00 mg/dL   Glucose, Bld 540 (H) 70 - 99 mg/dL    Comment: Glucose reference range applies only to samples taken after fasting for at least 8 hours.   Calcium, Ion 1.11 (L) 1.15 - 1.40 mmol/L   TCO2 20 (L) 22 - 32 mmol/L   Hemoglobin 12.9 12.0 - 15.0 g/dL   HCT 98.1 19.1 - 47.8 %  Urinalysis, Routine w reflex microscopic -Urine, Clean Catch     Status: Abnormal   Collection Time: 08/29/23  5:16 PM  Result Value Ref Range   Color, Urine YELLOW YELLOW   APPearance CLEAR CLEAR   Specific Gravity, Urine 1.026 1.005 - 1.030   pH 5.0 5.0 - 8.0   Glucose, UA >=500 (A) NEGATIVE mg/dL   Hgb urine dipstick NEGATIVE NEGATIVE   Bilirubin Urine NEGATIVE NEGATIVE   Ketones, ur NEGATIVE NEGATIVE mg/dL   Protein, ur NEGATIVE NEGATIVE mg/dL   Nitrite POSITIVE (A) NEGATIVE   Leukocytes,Ua NEGATIVE NEGATIVE   RBC / HPF 0-5 0 - 5 RBC/hpf   WBC, UA 0-5 0 - 5 WBC/hpf   Bacteria, UA RARE (A) NONE SEEN   Squamous Epithelial / HPF 0-5 0 - 5 /HPF    Comment: Performed at [facility], [address] [place], [place] [address]  CBG monitoring, ED     Status: Abnormal   Collection Time: 08/30/23 12:42 AM  Result Value Ref Range    Glucose-Capillary 139 (H) 70 - 99 mg/dL    Comment: Glucose reference range applies only to samples taken after fasting for at least 8 hours.  CBG monitoring, ED     Status: None   Collection Time: 08/30/23  3:42 AM  Result Value Ref Range   Glucose-Capillary 97 70 - 99 mg/dL    Comment: Glucose reference range applies only to samples taken after fasting for at least 8 hours.  Lipid panel     Status: None   Collection Time: 08/30/23  4:01 AM  Result Value Ref Range   Cholesterol 138 0 - 200 mg/dL   Triglycerides 82 <130 mg/dL   HDL 54 >86 mg/dL   Total CHOL/HDL Ratio 2.6 RATIO   VLDL 16 0 - 40 mg/dL   LDL Cholesterol 68 0 - 99 mg/dL    Comment:        Total Cholesterol/HDL:CHD Risk Coronary Heart Disease Risk Table                     Men   Women  1/2 Average Risk   3.4   3.3  Average Risk  5.0   4.4  2 X Average Risk   9.6   7.1  3 X Average Risk  23.4   11.0        Use the calculated Patient Ratio above and the CHD Risk Table to determine the patient's CHD Risk.        ATP III CLASSIFICATION (LDL):  <100     mg/dL   Optimal  161-096  mg/dL   Near or Above                    Optimal  130-159  mg/dL   Borderline  045-409  mg/dL   High  >811     mg/dL   Very High Performed at [facility], [address], [place], [place] [address]   CBG monitoring, ED     Status: Abnormal   Collection Time: 08/30/23 10:15 AM  Result Value Ref Range   Glucose-Capillary 149 (H) 70 - 99 mg/dL    Comment: Glucose reference range applies only to samples taken after fasting for at least 8 hours.  CBG monitoring, ED     Status: None   Collection Time: 08/30/23 12:01 PM  Result Value Ref Range   Glucose-Capillary 94 70 - 99 mg/dL    Comment: Glucose reference range applies only to samples taken after fasting for at least 8 hours.   MR BRAIN WO CONTRAST  Result Date: 08/30/2023 CLINICAL DATA:  Neuro deficit, acute, stroke suspected EXAM: MRI HEAD WITHOUT CONTRAST TECHNIQUE:  Multiplanar, multiecho pulse sequences of the brain and surrounding structures were obtained without intravenous contrast. COMPARISON:  Same day CTA head/neck angiogram.  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 No hemorrhage. No hydrocephalus. No extra-axial fluid collection. Sequela of moderate chronic microvascular ischemic change. Likely a 2.0 x 0.6 x 1.4 cm meningioma along the falx. This was present on prior head CT from 05/30/2014. Vascular: Normal flow voids. See same day CTA head and neck angiogram. Skull and upper cervical spine: Normal marrow signal. Sinuses/Orbits: No middle ear or mastoid effusion. Paranasal sinuses are grossly clear. Bilateral lens replacement. Orbits are otherwise unremarkable. Other: None. 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 No hemorrhage. Electronically Signed   By: Lorenza Cambridge M.D.   On: 08/30/2023 13:06   CT ANGIO HEAD NECK W WO CM  Result Date: 08/30/2023 CLINICAL DATA:  Dizziness EXAM: CT ANGIOGRAPHY HEAD AND NECK WITH AND WITHOUT CONTRAST TECHNIQUE: Multidetector CT imaging of the head and neck was performed using the standard protocol during bolus administration of intravenous contrast. Multiplanar CT image reconstructions and MIPs were obtained to evaluate the vascular anatomy. Carotid stenosis measurements (when applicable) are obtained utilizing NASCET criteria, using the distal internal carotid diameter as the denominator. RADIATION DOSE REDUCTION: This exam was performed according to the departmental dose-optimization program which includes automated exposure control, adjustment of the mA and/or kV according to patient size and/or use of iterative reconstruction technique. CONTRAST:  75mL OMNIPAQUE IOHEXOL 350 MG/ML SOLN COMPARISON:  None Available. FINDINGS: CTA NECK FINDINGS  SKELETON: No acute abnormality or high grade bony spinal canal stenosis. OTHER NECK: Normal pharynx, larynx and major salivary glands. No cervical lymphadenopathy. Unremarkable thyroid gland. UPPER CHEST: No pneumothorax or pleural effusion. No nodules or masses. AORTIC ARCH: There is calcific atherosclerosis of the aortic arch. Conventional 3 vessel aortic  branching pattern. RIGHT CAROTID SYSTEM: There is bulky calcific atherosclerosis at the right carotid bifurcation with an angiographic string sign of the proximal ICA. The ICA remains patent distally. LEFT CAROTID SYSTEM: Mild atherosclerosis within the common carotid artery and at the carotid bifurcation. No hemodynamically significant stenosis. VERTEBRAL ARTERIES: Left dominant configuration. There is moderate stenosis of the left vertebral artery at the C4 level. 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 The right vertebral artery is markedly diminutive along its entire course with minimal opacification. CTA HEAD FINDINGS POSTERIOR CIRCULATION: --Vertebral arteries: The left V4 segment is normal aside from mild calcification. The right V4 segment is non-opacified. --Inferior cerebellar arteries: Normal. --Basilar artery: Normal. --Superior cerebellar arteries: Normal. --Posterior cerebral arteries (PCA): Normal. ANTERIOR CIRCULATION: --Intracranial internal carotid arteries: Atherosclerotic calcification of the internal carotid arteries at the skull base without hemodynamically significant stenosis. --Anterior cerebral arteries (ACA): Normal. --Middle cerebral arteries (MCA): Normal. VENOUS SINUSES: As permitted by contrast timing, patent. ANATOMIC VARIANTS: None Review of the MIP images confirms the above findings. IMPRESSION: 1. No intracranial arterial occlusion or high-grade stenosis. 2. Bulky calcific atherosclerosis at the right carotid bifurcation with an angiographic string sign of the proximal  ICA. The ICA remains patent distally. 3. 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 Moderate stenosis of the V2 segment at the C4 level. 4. Non-dominant, diminutive right vertebral artery is poorly opacified and occluded at multiple locations along its course. No visible opacification of the V4 segment. Aortic Atherosclerosis (ICD10-I70.0). Electronically Signed   By: Deatra Robinson M.D.   On: 08/30/2023 03:39   DG Chest 2 View  Result Date: 08/29/2023 CLINICAL DATA:  Altered mental status. Fell yesterday. Facial droop and slurred speech. Headache and generalized weakness. EXAM: CHEST - 2 VIEW COMPARISON:  08/11/2023 FINDINGS: Cardiac pacemaker. Heart size and pulmonary vascularity are normal. Lungs are clear. No pleural effusions. No pneumothorax. Mediastinal contours appear intact. Degenerative changes in the spine and shoulders. IMPRESSION: No active cardiopulmonary disease. Electronically Signed   By: Burman Nieves M.D.   On: 08/29/2023 19:12   CT HEAD WO CONTRAST ( )  Result Date: 08/29/2023 CLINICAL DATA:  Minor head trauma.  Dizziness.  Poly trauma, blunt. EXAM: CT HEAD WITHOUT CONTRAST CT CERVICAL SPINE WITHOUT CONTRAST TECHNIQUE: Multidetector CT imaging of the head and cervical spine was performed following the standard protocol without intravenous contrast. Multiplanar CT image reconstructions of the cervical spine were also generated. RADIATION DOSE REDUCTION: This exam was performed according to the departmental dose-optimization program which includes automated exposure control, adjustment of the mA and/or kV according to patient size and/or use of iterative reconstruction technique. COMPARISON:  CT head 05/30/2014 FINDINGS: CT HEAD FINDINGS Brain: Diffuse cerebral atrophy. Mild ventricular dilatation consistent with central atrophy. Low-attenuation changes in the deep white matter consistent with small vessel ischemia. Old calcified  extra-axial lesion along the anterior falx to the right measuring 0.8 x 1.9 cm. This is likely a meningioma. Mild enlargement since previous study. No mass-effect or midline shift. No abnormal extra-axial fluid collections. Gray-white matter junctions are distinct. Basal cisterns are not effaced. No acute intracranial hemorrhage. Vascular: No hyperdense vessel or unexpected calcification. Skull: Normal. Negative for fracture or focal lesion. Sinuses/Orbits: No acute finding. Other: None. CT CERVICAL SPINE FINDINGS Alignment: Normal alignment. Skull base and vertebrae: No acute fracture. No primary bone lesion or focal pathologic process. Soft tissues and spinal canal: No prevertebral  fluid or swelling. No visible canal hematoma. Disc levels: Degenerative changes throughout with narrowed disc space interspaces and associated endplate osteophyte formation. Moderately prominent disc osteophyte complexes at C2-3, C4-5, and C6-7 causing some encroachment upon the central canal. Uncovertebral and facet joint spurring causes some encroachment upon the neural foramina bilaterally. Upper chest: Lung apices are clear. Other: None. IMPRESSION: 1. No acute intracranial abnormalities. Chronic atrophy and small vessel ischemic changes. Small right parafalcine meningioma. 2. Normal alignment of the cervical spine. No acute displaced fractures identified. Diffuse degenerative change. Electronically Signed   By: Burman Nieves M.D.   On: 08/29/2023 19:10   CT CERVICAL SPINE WO CONTRAST  Result Date: 08/29/2023 CLINICAL DATA:  Minor head trauma.  Dizziness.  Poly trauma, blunt. EXAM: CT HEAD WITHOUT CONTRAST CT CERVICAL SPINE WITHOUT CONTRAST TECHNIQUE: Multidetector CT imaging of the head and cervical spine was performed following the standard protocol without intravenous contrast. Multiplanar CT image reconstructions of the cervical spine were also generated. RADIATION DOSE REDUCTION: This exam was performed according to the  departmental dose-optimization program which includes automated exposure control, adjustment of the mA and/or kV according to patient size and/or use of iterative reconstruction technique. COMPARISON:  CT head 05/30/2014 FINDINGS: CT HEAD FINDINGS Brain: Diffuse cerebral atrophy. Mild ventricular dilatation consistent with central atrophy. Low-attenuation changes in the deep white matter consistent with small vessel ischemia. Old calcified extra-axial lesion along the anterior falx to the right measuring 0.8 x 1.9 cm. This is likely a meningioma. Mild enlargement since previous study. No mass-effect or midline shift. No abnormal extra-axial fluid collections. Gray-white matter junctions are distinct. Basal cisterns are not effaced. No acute intracranial hemorrhage. Vascular: No hyperdense vessel or unexpected calcification. Skull: Normal. Negative for fracture or focal lesion. Sinuses/Orbits: No acute finding. Other: None. CT CERVICAL SPINE FINDINGS Alignment: Normal alignment. Skull base and vertebrae: No acute fracture. No primary bone lesion or focal pathologic process. Soft tissues and spinal canal: No prevertebral fluid or swelling. No visible canal hematoma. Disc levels: Degenerative changes throughout with narrowed disc space interspaces and associated endplate osteophyte formation. Moderately prominent disc osteophyte complexes at C2-3, C4-5, and C6-7 causing some encroachment upon the central canal. Uncovertebral and facet joint spurring causes some encroachment upon the neural foramina bilaterally. Upper chest: Lung apices are clear. Other: None. IMPRESSION: 1. No acute intracranial abnormalities. Chronic atrophy and small vessel ischemic changes. Small right parafalcine meningioma. 2. Normal alignment of the cervical spine. No acute displaced fractures identified. Diffuse degenerative change. Electronically Signed   By: Burman Nieves M.D.   On: 08/29/2023 19:10    Pending Labs Unresulted Labs (From  admission, onward)     Start     Ordered   09/05/23 0500  Creatinine, serum  (enoxaparin (LOVENOX)    CrCl >/= 30 ml/min)  Weekly,   R     Comments: while on enoxaparin therapy    08/29/23 2312            Vitals/Pain Today's Vitals   08/30/23 0600 08/30/23 0700 08/30/23 1017 08/30/23 1345  BP: (!) 117/54 109/66 (!) 147/80 (!) 171/85  Pulse: 87 78 61 (!) 106  Resp: 17 16 19 19   Temp:   98.3 F (36.8 C) 98.1 F (36.7 C)  TempSrc:   Oral Oral  SpO2: 96% 96% 99% 99%  Weight:      Height:      PainSc:        Isolation Precautions Airborne and Contact precautions  Medications Medications  sodium chloride flush (NS) 0.9 % injection 3 mL (3 mLs Intravenous Not Given 08/29/23 1645)  enoxaparin (LOVENOX) injection 40 mg (40 mg Subcutaneous Given 08/30/23 0049)  aspirin EC tablet 81 mg (81 mg Oral Given 08/30/23 1008)  atorvastatin (LIPITOR) tablet 40 mg (40 mg Oral Given 08/30/23 1008)  cilostazol (PLETAL) tablet 100 mg (100 mg Oral Given 08/30/23 1226)  dorzolamide (TRUSOPT) 2 % ophthalmic solution 1 drop (1 drop Both Eyes Not Given 08/30/23 1000)  pantoprazole (PROTONIX) EC tablet 40 mg (40 mg Oral Given 08/30/23 1008)  multivitamin with minerals tablet 1 tablet (1 tablet Oral Given 08/30/23 1008)  acetaminophen (TYLENOL) tablet 650 mg (650 mg Oral Given 08/30/23 0100)  polyethylene glycol (MIRALAX / GLYCOLAX) packet 17 g (has no administration in time range)  melatonin tablet 5 mg (has no administration in time range)  prochlorperazine (COMPAZINE) injection 5 mg (has no administration in time range)  insulin aspart (novoLOG) injection 0-9 Units ( Subcutaneous Not Given 08/30/23 1224)  lactated ringers bolus 1,000 mL (0 mLs Intravenous Stopped 08/29/23 2202)  iohexol (OMNIPAQUE) 350 MG/ML injection 75 mL (75 mLs Intravenous Contrast Given 08/30/23 0327)    Mobility walks     Focused Assessments Cardiac Assessment Handoff:  Cardiac Rhythm: Atrial paced Lab Results  Component  Value Date   CKTOTAL 12,011 (H) 11/07/2015   CKMB 59.7 (H) 11/04/2015   TROPONINI <0.03 12/11/2014   Lab Results  Component Value Date   DDIMER 0.47 12/10/2014   Does the Patient currently have chest pain? No    R Recommendations: See Admitting Provider Note  Report given to:   Additional Notes:

## 2023-08-30 NOTE — Inpatient Diabetes Management (Signed)
Inpatient Diabetes Program Recommendations  AACE/ADA: New Consensus Statement on Inpatient Glycemic Control (2015)  Target Ranges:  Prepandial:   less than 140 mg/dL      Peak postprandial:   less than 180 mg/dL (1-2 hours)      Critically ill patients:  140 - 180 mg/dL   Lab Results  Component Value Date   GLUCAP 94 08/30/2023   HGBA1C 10.1 (H) 08/12/2023    Review of Glycemic Control  Latest Reference Range & Units 08/29/23 16:35 08/30/23 00:42 08/30/23 03:42 08/30/23 10:15 08/30/23 12:01  Glucose-Capillary 70 - 99 mg/dL 161 (H) 096 (H) 97 045 (H) 94   Diabetes history: DM 2 Outpatient Diabetes medications:Jardiance 25 mg Daily, Amaryl 1 mg Daily Current orders for Inpatient glycemic control:  Novolog 0-9 units Q4 hours   A1c 10.1% on 9/15  Spoke with pt at bedside regarding A1c of 10.1%. Pt reports seeing her PCP every 6 weeks. Discussed glucose and A1c goals. Pt reports taking her medications daily as laid out by her daughter and checking her glucose daily. Pt reports at times she has low glucose levels and other times her levels are in the 200 range. Pt reports being taken off of her basal insulin due to low glucose trends. She is only on orals. Encouraged pt to call her PCP for titration of her medications prior to her follow up visit if needed.  Thanks,  Christena Deem RN, MSN, BC-ADM Inpatient Diabetes Coordinator Team Pager [phone] (8a-5p)

## 2023-08-30 NOTE — ED Notes (Signed)
Messaged Dr Tyson Babinski about patients recent BP.  New orders for hydralazine PRN added to [facility]

## 2023-08-30 NOTE — ED Notes (Addendum)
PT to MRI. NAD noted at this time.

## 2023-08-30 NOTE — Evaluation (Addendum)
Physical Therapy Evaluation Patient Details Name: Amanda Stewart MRN: [id number] DOB: 16-Sep-1941 Today's Date: 08/30/2023  History of Present Illness  Patient is a 82 year old female who presents from home with multiple falls and generalized weakness. Family noted left facial droop and slurred speech. History of pacemaker. Recent hospital admission with orthostasis and falls at home.  Clinical Impression  Patient is agreeable to PT evaluation. She reports she is independent at baseline and lives alone. She intermittently uses a cane or rolling walker and reports 9 falls since her recent hospital stay.  Today the patient required assistance with all mobility. She reports L side tingling and has generalized weakness throughout. Standing attempted several times with minimal weight acceptance on both legs. Unable to fully stand with one person assistance. The patient does not appear to be at her baseline level of functional independence. Anticipate patient could benefit from intensive rehabilitation following this hospital stay >3 hours/day. Patient would require significant support if going home.       If plan is discharge home, recommend the following: Two people to help with walking and/or transfers;A lot of help with bathing/dressing/bathroom;Supervision due to cognitive status;Help with stairs or ramp for entrance;Assistance with cooking/housework   Can travel by private vehicle        Equipment Recommendations  (to be determined at next level of care)  Recommendations for Other Services  Rehab consult    Functional Status Assessment Patient has had a recent decline in their functional status and demonstrates the ability to make significant improvements in function in a reasonable and predictable amount of time.     Precautions / Restrictions Precautions Precautions: Fall Restrictions Weight Bearing Restrictions: No      Mobility  Bed Mobility Overal bed mobility: Needs  Assistance Bed Mobility: Supine to Sit, Sit to Supine     Supine to sit: Min assist Sit to supine: Min assist   General bed mobility comments: increased time and effort required with mild dizziness reported initially with sitting up. no significant change in vitals is noted with activity    Transfers Overall transfer level: Needs assistance                 General transfer comment: attempted to stand from stretcher. patient accepts minimal weight through BLE and provided questionable effort with standing. she was able to minimally clear posterior buttocks from bed. unable to fully stand with one person assistance despite multiple attempts    Ambulation/Gait               General Gait Details: unable to at this time  Stairs            Wheelchair Mobility     Tilt Bed    Modified Rankin (Stroke Patients Only)       Balance Overall balance assessment: Needs assistance Sitting-balance support: Feet supported Sitting balance-Leahy Scale: Fair Sitting balance - Comments: no loss of balance in sitting position. close stand by assistance for safety                                     Pertinent Vitals/Pain Pain Assessment Pain Assessment: No/denies pain    Home Living Family/patient expects to be discharged to:: Private residence Living Arrangements: Alone Available Help at Discharge: Family Type of Home: Apartment Home Access: Elevator       Home Layout: One level Home Equipment: Agricultural consultant (2  wheels);Grab bars - toilet;Lift chair;Other (comment)      Prior Function Prior Level of Function : Independent/Modified Independent;History of Falls (last six months)             Mobility Comments: only intermittent use of DME (cane or rolling walker). she reports 9 falls since her last hospital stay ADLs Comments: independent per patient report     Extremity/Trunk Assessment   Upper Extremity Assessment Upper Extremity  Assessment: Right hand dominant;RUE deficits/detail;LUE deficits/detail;Generalized weakness RUE Deficits / Details: AROM grossl WFL for functional tasks LUE Deficits / Details: mild decreased grip strength compared to right (AROM is grossly WFL for functional tasks) LUE Sensation: decreased light touch (per patient report (tingling))    Lower Extremity Assessment Lower Extremity Assessment: Generalized weakness;LLE deficits/detail;RLE deficits/detail RLE Deficits / Details: questionable effort with mobility attempts. patient is able to SLR, dorsiflexion and plantarflexion at least 4/5 RLE Sensation: WNL LLE Deficits / Details: questionable effort with mobility attempts. patient is able to SLR, dorsiflexion and plantarflexion at least 4/5 LLE Sensation: decreased light touch (per patient report (tingling))       Communication   Communication Communication: Hearing impairment;Difficulty following commands/understanding Following commands: Follows one step commands with increased time Cueing Techniques: Verbal cues;Tactile cues;Gestural cues;Visual cues  Cognition Arousal: Alert Behavior During Therapy: Flat affect Overall Cognitive Status: No family/caregiver present to determine baseline cognitive functioning                                 General Comments: patient is oriented x 4. she is able to follow single step commands with increased time and multi-modal cues. mild delay with processing noted.        General Comments      Exercises     Assessment/Plan    PT Assessment Patient needs continued PT services  PT Problem List Decreased strength;Decreased range of motion;Decreased activity tolerance;Decreased balance;Decreased mobility;Decreased cognition;Decreased knowledge of use of DME;Decreased safety awareness;Impaired sensation       PT Treatment Interventions DME instruction;Gait training;Stair training;Functional mobility training;Therapeutic  activities;Therapeutic exercise;Balance training;Neuromuscular re-education;Cognitive remediation;Patient/family education    PT Goals (Current goals can be found in the Care Plan section)  Acute Rehab PT Goals Patient Stated Goal: to avoid falls PT Goal Formulation: With patient Time For Goal Achievement: 09/06/23 Potential to Achieve Goals: Fair    Frequency Min 1X/week     Co-evaluation               AM-PAC PT "6 Clicks" Mobility  Outcome Measure Help needed turning from your back to your side while in a flat bed without using bedrails?: A Little Help needed moving from lying on your back to sitting on the side of a flat bed without using bedrails?: A Little Help needed moving to and from a bed to a chair (including a wheelchair)?: Total Help needed standing up from a chair using your arms (e.g., wheelchair or bedside chair)?: A Lot Help needed to walk in hospital room?: Total Help needed climbing 3-5 steps with a railing? : Total 6 Click Score: 11    End of Session   Activity Tolerance: Patient limited by fatigue Patient left: in bed;with call bell/phone within reach Nurse Communication: Mobility status PT Visit Diagnosis: Muscle weakness (generalized) (M62.81);Difficulty in walking, not elsewhere classified (R26.2);Repeated falls (R29.6)    Time: 2536-6440 PT Time Calculation (min) (ACUTE ONLY): 17 min   Charges:   PT Evaluation $PT  Eval Low Complexity: 1 Low PT General Charges $$ ACUTE PT VISIT: 1 Visit         Donna Bernard, PT, MPT   Ina Homes 08/30/2023, 10:31 AM

## 2023-08-30 NOTE — ED Notes (Signed)
Pt reporting stomach pain. This RN attempted to decompress PEG, pt refused. Requesting " a test" via interpreter and communication board.

## 2023-08-30 NOTE — Progress Notes (Addendum)
Inpatient Rehab Admissions Coordinator:   Per PT recommendations pt was screened for CIR by Estill Dooms, PT, DPT.  Note workup underway.  With patient's insurance will need a diagnosis to assess whether we could pursue CIR.  Will follow up later today/tomorrow and rescreen.   1347: at this time we are recommending a CIR consult and I will place an order per our protocol.  Estill Dooms, PT, DPT Admissions Coordinator [phone] 08/30/23  12:42 PM

## 2023-08-30 NOTE — ED Notes (Signed)
PT is off the floor

## 2023-08-30 NOTE — ED Provider Notes (Signed)
See same-day resident note.  I am the attesting provider.  Evaluated at bedside and agree with plan of care per resident.  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  Consulted neurology who agreed with diagnosis.  Unfortunately she cannot get MRI tonight due to pacemaker placement and need for technologist to be on scene. Will admit to medicine overnight with MRI for stroke eval tomorrow.  CRITICAL CARE Performed by: Glyn Ade   Total critical care time: 45 minutes  Critical care time was exclusive of separately billable procedures and treating other patients.  Critical care was necessary to treat or prevent imminent or life-threatening deterioration.  Critical care was time spent personally by me on the following activities: development of treatment plan with patient and/or surrogate as well as nursing, discussions with consultants, evaluation of patient's response to treatment, examination of patient, obtaining history from patient or surrogate, ordering and performing treatments and interventions, ordering and review of laboratory studies, ordering and review of radiographic studies, pulse oximetry and re-evaluation of patient's condition.  Disposition:   Based on the above findings, I believe this patient is stable for admission.    Patient/family educated about specific findings on our evaluation and explained exact reasons for admission.  Patient/family educated about clinical situation and time was allowed to answer questions.   Admission team communicated with and agreed with need for admission. Patient admitted. Patient ready to move at this time.     Emergency Department Medication Summary:   Medications  sodium chloride flush (NS) 0.9 % injection 3 mL (3 mLs Intravenous Not Given 08/29/23 1645)  enoxaparin (LOVENOX) injection 40 mg (40 mg Subcutaneous Given 08/30/23 0049)  aspirin EC tablet 81 mg (81 mg Oral Given 08/30/23  1008)  dorzolamide (TRUSOPT) 2 % ophthalmic solution 1 drop (1 drop Both Eyes Not Given 08/30/23 1000)  pantoprazole (PROTONIX) EC tablet 40 mg (40 mg Oral Given 08/30/23 1008)  multivitamin with minerals tablet 1 tablet (1 tablet Oral Given 08/30/23 1008)  acetaminophen (TYLENOL) tablet 650 mg (650 mg Oral Given 08/30/23 0100)  polyethylene glycol (MIRALAX / GLYCOLAX) packet 17 g (has no administration in time range)  melatonin tablet 5 mg (has no administration in time range)  prochlorperazine (COMPAZINE) injection 5 mg (has no administration in time range)  insulin aspart (novoLOG) injection 0-9 Units (3 Units Subcutaneous Given 08/30/23 1624)  hydrALAZINE (APRESOLINE) injection 10 mg (has no administration in time range)  clopidogrel (PLAVIX) tablet 75 mg (75 mg Oral Given 08/30/23 1639)  atorvastatin (LIPITOR) tablet 80 mg (has no administration in time range)  lactated ringers bolus 1,000 mL (0 mLs Intravenous Stopped 08/29/23 2202)  iohexol (OMNIPAQUE) 350 MG/ML injection 75 mL (75 mLs Intravenous Contrast Given 08/30/23 0327)          Glyn Ade, MD 08/30/23 1911

## 2023-08-30 NOTE — Consult Note (Addendum)
Hospital Consult    Reason for Consult:  High grade right ICA stenosis  Requesting Physician:  Dr. Roda Shutters MRN #:  [id number]  History of Present Illness: This is a 82 y.o. female with past medical history of Diabetes Mellitus, CAD with pacemaker, HTN, HLD, PAD, CKD stage IIIa, who presented to ED after multiple falls. Noted to have left facial droop and slurred speech per her daughter. She explains that she has had approximately 9 recent falls over the last several weeks. She also reports dizziness, blurred vision in both eyes, headaches and left sided weakness. Presently she says she still is experiencing dizziness, mildly blurred vision and slurred speech. She says that her left side does not feel weak any longer. She is a former smoker. She does have history of PAD on Aspirin and Pletal. She reports that she cannot walk very far because of discomfort in her legs but she is generally able to ambulate without assistance. 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   Past Medical History:  Diagnosis Date   Allergic rhinitis    Anemia    Arrhythmia    CAD (coronary artery disease)    Chest pain    Chronic kidney disease, stage 3a (HCC) 08/11/2023   Combined systolic and diastolic heart failure (HCC)    Diabetes mellitus without complication (HCC)    Diabetic peripheral neuropathy (HCC)    GERD (gastroesophageal reflux disease)    Heart block    Hyperlipidemia    Hypertension    Left bundle branch block    Low back pain    Myocardial infarction (HCC)    Peripheral arterial disease (HCC)    Retinopathy      Past Surgical History:  Procedure Laterality Date   ABDOMINAL HYSTERECTOMY     LEFT AND RIGHT HEART CATHETERIZATION WITH CORONARY ANGIOGRAM N/A 05/26/2014   Procedure: LEFT AND RIGHT HEART CATHETERIZATION WITH CORONARY ANGIOGRAM;  Surgeon: [name], MD;  Location: [facility] CATH LAB;  Service: Cardiovascular;  Laterality: N/A;   PACEMAKER IMPLANT N/A 08/08/2021   Procedure: PACEMAKER IMPLANT;  Surgeon: Lanier Prude, MD;  Location: [facility] INVASIVE CV LAB;  Service: Cardiovascular;  Laterality: N/A;   TEMPORARY PACEMAKER N/A 08/07/2021   Procedure: TEMPORARY PACEMAKER;  Surgeon: Rinaldo Cloud, MD;  Location: [facility] INVASIVE CV LAB;  Service: Cardiovascular;  Laterality: N/A;    Allergies  Allergen Reactions   Metformin And Related Other (See Comments)    Diarrhea     Prior to Admission medications   Medication Sig Start Date End Date Taking? Authorizing Provider  acetaminophen (TYLENOL) 325 MG tablet Take 1-2 tablets (325-650 mg total) by mouth every 4 (four) hours as needed for mild pain. Patient taking differently: Take 325 mg by mouth every 4 (four) hours as needed for mild pain. 08/10/21  Yes [name], MD  albuterol (VENTOLIN HFA) 108 (90 Base) MCG/ACT inhaler Inhale 2 puffs into the lungs every 6 (six) hours as needed for wheezing or shortness of breath.   Yes [provider]  amLODipine (NORVASC) 10 MG tablet Take 10 mg by mouth daily.   Yes [provider]  aspirin EC 81 MG tablet  Take 81 mg by mouth daily.   Yes [provider]  atorvastatin (LIPITOR) 40 MG tablet Take 40 mg by mouth daily.   Yes [provider]  benzonatate (TESSALON) 100 MG capsule Take 100-200 mg by mouth every 8 (eight) hours as needed for cough. 07/27/23  Yes [provider]  cilostazol (PLETAL) 100 MG tablet Take 100 mg by mouth daily.   Yes [provider]  dorzolamide (TRUSOPT) 2 % ophthalmic solution Place 1 drop into both eyes 2 (two) times daily.  07/09/21  Yes [provider]  empagliflozin (JARDIANCE) 25 MG TABS tablet Take 25 mg by mouth daily.   Yes [provider]  glimepiride (AMARYL) 1 MG tablet Take 1 mg by mouth daily with breakfast.   Yes [provider]  hydrALAZINE (APRESOLINE) 50 MG tablet Take 50 mg by mouth 2 (two) times daily. 07/09/21  Yes [provider]  losartan (COZAAR) 100 MG tablet Take 1 tablet (100 mg total) by mouth daily. 08/10/21  Yes [name], MD  metoprolol tartrate (LOPRESSOR) 25 MG tablet Take 1 tablet (25 mg total) by mouth 2 (two) times daily. 08/13/23  Yes Lewie Chamber, MD  Multiple Vitamin (MULTIVITAMIN WITH MINERALS) TABS tablet Take 1 tablet by mouth daily.   Yes [provider]  nitroGLYCERIN (NITROSTAT) 0.4 MG SL tablet Place 1 tablet (0.4 mg total) under the tongue every 5 (five) minutes x 3 doses as needed for chest pain. 12/12/14  Yes [name], MD  pantoprazole (PROTONIX) 40 MG tablet Take 1 tablet (40 mg total) by mouth daily at 6 (six) AM. 08/11/21  Yes [name], MD  insulin glargine (LANTUS) 100 UNIT/ML Solostar Pen Inject 5 Units into the skin at bedtime. Patient not taking: Reported on 08/11/2023 08/10/21   [name], MD    Social History   Socioeconomic History   Marital status: Married    Spouse name: Not on file   Number of children: Not on file   Years of education: Not on file   Highest education level: Not on file  Occupational History   Not on file  Tobacco Use   Smoking status: Never   Smokeless tobacco: Never  Vaping Use   Vaping status: Never Used  Substance and Sexual Activity   Alcohol use: Yes    Alcohol/week: 1.0 standard drink of alcohol    Types: 1 Glasses of wine per week   Drug use: No   Sexual activity: Not on file  Other Topics Concern   Not on file  Social History Narrative   Not on file   Social Determinants of Health   Financial Resource Strain: Not on file  Food Insecurity: No Food  Insecurity (08/12/2023)   Hunger Vital Sign    Worried About Running Out of Food in the Last Year: Never true    Ran Out of Food in the Last Year: Never true  Transportation Needs: No Transportation Needs (08/12/2023)   PRAPARE - Administrator, Civil Service (Medical): No    Lack of Transportation (Non-Medical): No  Physical Activity: Not on file  Stress: Not on file  Social Connections: Not on file  Intimate Partner Violence: Not At Risk (08/12/2023)   Humiliation, Afraid, Rape, and Kick questionnaire    Fear of Current or Ex-Partner: No    Emotionally Abused: No    Physically Abused: No    Sexually Abused: No     Family History  Problem Relation Age of Onset  Diabetes Mother    Diabetes Father    Stroke Father     ROS: Otherwise negative unless mentioned in HPI  Physical Examination  Vitals:   08/30/23 1017 08/30/23 1345  BP: (!) 147/80 (!) 171/85  Pulse: 61 (!) 106  Resp: 19 19  Temp: 98.3 F (36.8 C) 98.1 F (36.7 C)  SpO2: 99% 99%   Body mass index is 28.76 kg/m.  General:  WDWN in NAD Gait: Not observed HENT: WNL, normocephalic Pulmonary: normal non-labored breathing, without wheezing Cardiac: regular Abdomen:  soft Vascular Exam/Pulses: 2+ radial pulses bilaterally, 2+ femoral, 2+ left Dp, Right PT 1+ feet warm and well perfused Extremities: without ischemic changes, without Gangrene , without cellulitis; without open wounds; moving all extremities without deficits Musculoskeletal: no muscle wasting or atrophy  Neurologic: A&O X 3; speech is coherent, EOMI, sensation is intact and symmetric, tongue midline. Mild left facial droop Psychiatric:  The pt has Normal affect.   CBC    Component Value Date/Time   WBC 8.0 08/29/2023 1630   RBC 3.95 08/29/2023 1630   HGB 12.9 08/29/2023 1637   HCT 38.0 08/29/2023 1637   PLT 196 08/29/2023 1630   MCV 97.7 08/29/2023 1630   MCH 32.2 08/29/2023 1630   MCHC 32.9 08/29/2023 1630   RDW 14.6  08/29/2023 1630   LYMPHSABS 1.4 08/29/2023 1630   MONOABS 0.5 08/29/2023 1630   EOSABS 0.0 08/29/2023 1630   BASOSABS 0.1 08/29/2023 1630    BMET    Component Value Date/Time   NA 141 08/29/2023 1637   K 3.6 08/29/2023 1637   CL 109 08/29/2023 1637   CO2 20 (L) 08/29/2023 1630   GLUCOSE 283 (H) 08/29/2023 1637   BUN 17 08/29/2023 1637   CREATININE 1.20 (H) 08/29/2023 1637   CREATININE 1.25 (H) 12/22/2022 0000   CALCIUM 9.5 08/29/2023 1630   GFRNONAA 46 (L) 08/29/2023 1630   GFRNONAA 59 (L) 01/05/2021 0948   GFRAA 69 01/05/2021 0948    COAGS: Lab Results  Component Value Date   INR 0.9 08/29/2023   INR 1.0 08/07/2021   INR 1.36 12/11/2014     Non-Invasive Vascular Imaging:   CT Angio Head Neck w wo CM IMPRESSION: 1. No intracranial arterial occlusion or high-grade stenosis. 2. Bulky calcific atherosclerosis at the right carotid bifurcation with an angiographic string sign of the proximal ICA. The ICA remains patent distally. 3. 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 Moderate stenosis of the V2 segment at the C4 level. 4. Non-dominant, diminutive right vertebral artery is poorly opacified and occluded at multiple locations along its course. No visible opacification of the V4 segment.  Statin:  Yes.   Beta Blocker:  Yes.   Aspirin:  Yes.   ACEI:  No. ARB:  Yes.   CCB use:  Yes Other antiplatelets/anticoagulants:  No.    ASSESSMENT/PLAN: This is a 82 y.o. female who presented to ER after multiple falls over the past several weeks. Also recently been experiencing dizziness, headaches, visual changes, slurred speech, facial drooping and left sided upper and lower extremity weakness. 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 CTA showing occlusion of the left vertebral artery and diseased diminutive right vertebral with bulky calcific  atherosclerosis of the right ICA bifurcation with a string sign. She is currently on Aspirin and Statin. Would recommend starting Plavix. She will benefit from right ICA  intervention to reduce her risk of recurrent stroke. With her dense calcific disease she will likely need a right carotid endarterectomy vs TCAR however the lesion is very deep. The on call vascular surgeon, Dr. Hetty Blend will review her imaging and provide further surgical management recommendations.   Graceann Congress PA-C [facility] [phone] 08/30/2023  1:57 PM   VASCULAR STAFF ADDENDUM: I have independently interviewed and examined the patient. I agree with the above.  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 Her anatomy makes intervention difficult given the depth and medial course of her ICA as well as the almost circumferential, dense calcific plaque. Will discuss with my partners tomorrow morning regarding possible intervention planning   Daria Pastures MD [facility] Phone Number: [phone] 08/30/2023 6:29 PM

## 2023-08-30 NOTE — Progress Notes (Addendum)
STROKE TEAM PROGRESS NOTE   BRIEF HPI [name] is a 82 y.o. female with history of  CAD, DM2, HTN, HLD, PAD treated with aspirin and cilostazol presenting with approximately 3 weeks of multiple falls, L sided facial droop, headaches, and slurred speech. Last fall 5 pm on 10/1 prior to admission. LKW not known, no thrombolytics and no IR intervention.   SIGNIFICANT HOSPITAL EVENTS 10/2 - Admitted to ED 10/3 - MRI, vascular surgery consulted  INTERIM HISTORY/SUBJECTIVE Pt reports no new symptoms since her evaluation last night, and is able to report her history clearly. She has no new weakness or worsening of sensation. Shared with patient that she had had a stroke, and that the team would be doing an MRI to look at the brain and vessels in greater detail. Based on the calcification we saw in her carotid, the team recommended a consult with vascular surgery to assess whether she might benefit from surgical intervention. She voiced understanding of this plan.    OBJECTIVE  CBC    Component Value Date/Time   WBC 8.0 08/29/2023 1630   RBC 3.95 08/29/2023 1630   HGB 12.9 08/29/2023 1637   HCT 38.0 08/29/2023 1637   PLT 196 08/29/2023 1630   MCV 97.7 08/29/2023 1630   MCH 32.2 08/29/2023 1630   MCHC 32.9 08/29/2023 1630   RDW 14.6 08/29/2023 1630   LYMPHSABS 1.4 08/29/2023 1630   MONOABS 0.5 08/29/2023 1630   EOSABS 0.0 08/29/2023 1630   BASOSABS 0.1 08/29/2023 1630    BMET    Component Value Date/Time   NA 141 08/29/2023 1637   K 3.6 08/29/2023 1637   CL 109 08/29/2023 1637   CO2 20 (L) 08/29/2023 1630   GLUCOSE 283 (H) 08/29/2023 1637   BUN 17 08/29/2023 1637   CREATININE 1.20 (H) 08/29/2023 1637   CREATININE 1.25 (H) 12/22/2022 0000   CALCIUM 9.5 08/29/2023 1630   EGFR 43 (L) 12/22/2022 0000   GFRNONAA 46 (L) 08/29/2023 1630   GFRNONAA 59 (L) 01/05/2021 0948    IMAGING past 24 hours MR BRAIN WO CONTRAST  Result Date: 08/30/2023 CLINICAL DATA:  Neuro deficit,  acute, stroke suspected EXAM: MRI HEAD WITHOUT CONTRAST TECHNIQUE: Multiplanar, multiecho pulse sequences of the brain and surrounding structures were obtained without intravenous contrast. COMPARISON:  Same day CTA head/neck angiogram.  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 No hemorrhage. No hydrocephalus. No extra-axial fluid collection. Sequela of moderate chronic microvascular ischemic change. Likely a 2.0 x 0.6 x 1.4 cm meningioma along the falx. This was present on prior head CT from 05/30/2014. Vascular: Normal flow voids. See same day CTA head and neck angiogram. Skull and upper cervical spine: Normal marrow signal. Sinuses/Orbits: No middle ear or mastoid effusion. Paranasal sinuses are grossly clear. Bilateral lens replacement. Orbits are otherwise unremarkable. Other: None. 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 No hemorrhage. Electronically Signed   By: Lorenza Cambridge M.D.   On: 08/30/2023 13:06   CT ANGIO HEAD NECK W WO CM  Result Date: 08/30/2023 CLINICAL DATA:  Dizziness EXAM: CT ANGIOGRAPHY HEAD AND NECK WITH AND WITHOUT CONTRAST TECHNIQUE: Multidetector CT imaging of the head and neck was performed using the standard protocol during bolus administration of intravenous contrast. Multiplanar CT image reconstructions and MIPs were obtained to evaluate the vascular anatomy. Carotid stenosis measurements (  when applicable) are obtained utilizing NASCET criteria, using the distal internal carotid diameter as the denominator. RADIATION DOSE REDUCTION: This exam was performed according to the departmental dose-optimization program which includes automated exposure control, adjustment of the mA and/or kV according to patient size and/or use of iterative reconstruction technique. CONTRAST:  75mL OMNIPAQUE IOHEXOL 350  MG/ML SOLN COMPARISON:  None Available. FINDINGS: CTA NECK FINDINGS SKELETON: No acute abnormality or high grade bony spinal canal stenosis. OTHER NECK: Normal pharynx, larynx and major salivary glands. No cervical lymphadenopathy. Unremarkable thyroid gland. UPPER CHEST: No pneumothorax or pleural effusion. No nodules or masses. AORTIC ARCH: There is calcific atherosclerosis of the aortic arch. Conventional 3 vessel aortic branching pattern. RIGHT CAROTID SYSTEM: There is bulky calcific atherosclerosis at the right carotid bifurcation with an angiographic string sign of the proximal ICA. The ICA remains patent distally. LEFT CAROTID SYSTEM: Mild atherosclerosis within the common carotid artery and at the carotid bifurcation. No hemodynamically significant stenosis. VERTEBRAL ARTERIES: Left dominant configuration. There is moderate stenosis of the left vertebral artery at the C4 level. 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 The right vertebral artery is markedly diminutive along its entire course with minimal opacification. CTA HEAD FINDINGS POSTERIOR CIRCULATION: --Vertebral arteries: The left V4 segment is normal aside from mild calcification. The right V4 segment is non-opacified. --Inferior cerebellar arteries: Normal. --Basilar artery: Normal. --Superior cerebellar arteries: Normal. --Posterior cerebral arteries (PCA): Normal. ANTERIOR CIRCULATION: --Intracranial internal carotid arteries: Atherosclerotic calcification of the internal carotid arteries at the skull base without hemodynamically significant stenosis. --Anterior cerebral arteries (ACA): Normal. --Middle cerebral arteries (MCA): Normal. VENOUS SINUSES: As permitted by contrast timing, patent. ANATOMIC VARIANTS: None Review of the MIP images confirms the above findings. IMPRESSION: 1. No intracranial arterial occlusion or high-grade stenosis. 2. Bulky calcific atherosclerosis at the right  carotid bifurcation with an angiographic string sign of the proximal ICA. The ICA remains patent distally. 3. 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 Moderate stenosis of the V2 segment at the C4 level. 4. Non-dominant, diminutive right vertebral artery is poorly opacified and occluded at multiple locations along its course. No visible opacification of the V4 segment. Aortic Atherosclerosis (ICD10-I70.0). Electronically Signed   By: Deatra Robinson M.D.   On: 08/30/2023 03:39   DG Chest 2 View  Result Date: 08/29/2023 CLINICAL DATA:  Altered mental status. Fell yesterday. Facial droop and slurred speech. Headache and generalized weakness. EXAM: CHEST - 2 VIEW COMPARISON:  08/11/2023 FINDINGS: Cardiac pacemaker. Heart size and pulmonary vascularity are normal. Lungs are clear. No pleural effusions. No pneumothorax. Mediastinal contours appear intact. Degenerative changes in the spine and shoulders. IMPRESSION: No active cardiopulmonary disease. Electronically Signed   By: Burman Nieves M.D.   On: 08/29/2023 19:12   CT HEAD WO CONTRAST ( )  Result Date: 08/29/2023 CLINICAL DATA:  Minor head trauma.  Dizziness.  Poly trauma, blunt. EXAM: CT HEAD WITHOUT CONTRAST CT CERVICAL SPINE WITHOUT CONTRAST TECHNIQUE: Multidetector CT imaging of the head and cervical spine was performed following the standard protocol without intravenous contrast. Multiplanar CT image reconstructions of the cervical spine were also generated. RADIATION DOSE REDUCTION: This exam was performed according to the departmental dose-optimization program which includes automated exposure control, adjustment of the mA and/or kV according to patient size and/or use of iterative reconstruction technique. COMPARISON:  CT head 05/30/2014 FINDINGS: CT HEAD FINDINGS Brain: Diffuse cerebral atrophy. Mild ventricular  dilatation consistent with central atrophy. Low-attenuation changes in the  deep white matter consistent with small vessel ischemia. Old calcified extra-axial lesion along the anterior falx to the right measuring 0.8 x 1.9 cm. This is likely a meningioma. Mild enlargement since previous study. No mass-effect or midline shift. No abnormal extra-axial fluid collections. Gray-white matter junctions are distinct. Basal cisterns are not effaced. No acute intracranial hemorrhage. Vascular: No hyperdense vessel or unexpected calcification. Skull: Normal. Negative for fracture or focal lesion. Sinuses/Orbits: No acute finding. Other: None. CT CERVICAL SPINE FINDINGS Alignment: Normal alignment. Skull base and vertebrae: No acute fracture. No primary bone lesion or focal pathologic process. Soft tissues and spinal canal: No prevertebral fluid or swelling. No visible canal hematoma. Disc levels: Degenerative changes throughout with narrowed disc space interspaces and associated endplate osteophyte formation. Moderately prominent disc osteophyte complexes at C2-3, C4-5, and C6-7 causing some encroachment upon the central canal. Uncovertebral and facet joint spurring causes some encroachment upon the neural foramina bilaterally. Upper chest: Lung apices are clear. Other: None. IMPRESSION: 1. No acute intracranial abnormalities. Chronic atrophy and small vessel ischemic changes. Small right parafalcine meningioma. 2. Normal alignment of the cervical spine. No acute displaced fractures identified. Diffuse degenerative change. Electronically Signed   By: Burman Nieves M.D.   On: 08/29/2023 19:10   CT CERVICAL SPINE WO CONTRAST  Result Date: 08/29/2023 CLINICAL DATA:  Minor head trauma.  Dizziness.  Poly trauma, blunt. EXAM: CT HEAD WITHOUT CONTRAST CT CERVICAL SPINE WITHOUT CONTRAST TECHNIQUE: Multidetector CT imaging of the head and cervical spine was performed following the standard protocol without intravenous contrast. Multiplanar CT image reconstructions of the cervical spine were also  generated. RADIATION DOSE REDUCTION: This exam was performed according to the departmental dose-optimization program which includes automated exposure control, adjustment of the mA and/or kV according to patient size and/or use of iterative reconstruction technique. COMPARISON:  CT head 05/30/2014 FINDINGS: CT HEAD FINDINGS Brain: Diffuse cerebral atrophy. Mild ventricular dilatation consistent with central atrophy. Low-attenuation changes in the deep white matter consistent with small vessel ischemia. Old calcified extra-axial lesion along the anterior falx to the right measuring 0.8 x 1.9 cm. This is likely a meningioma. Mild enlargement since previous study. No mass-effect or midline shift. No abnormal extra-axial fluid collections. Gray-white matter junctions are distinct. Basal cisterns are not effaced. No acute intracranial hemorrhage. Vascular: No hyperdense vessel or unexpected calcification. Skull: Normal. Negative for fracture or focal lesion. Sinuses/Orbits: No acute finding. Other: None. CT CERVICAL SPINE FINDINGS Alignment: Normal alignment. Skull base and vertebrae: No acute fracture. No primary bone lesion or focal pathologic process. Soft tissues and spinal canal: No prevertebral fluid or swelling. No visible canal hematoma. Disc levels: Degenerative changes throughout with narrowed disc space interspaces and associated endplate osteophyte formation. Moderately prominent disc osteophyte complexes at C2-3, C4-5, and C6-7 causing some encroachment upon the central canal. Uncovertebral and facet joint spurring causes some encroachment upon the neural foramina bilaterally. Upper chest: Lung apices are clear. Other: None. IMPRESSION: 1. No acute intracranial abnormalities. Chronic atrophy and small vessel ischemic changes. Small right parafalcine meningioma. 2. Normal alignment of the cervical spine. No acute displaced fractures identified. Diffuse degenerative change. Electronically Signed   By: Burman Nieves M.D.   On: 08/29/2023 19:10    Vitals:   08/30/23 0500 08/30/23 0600 08/30/23 0700 08/30/23 1017  BP: (!) 138/46 (!) 117/54 109/66 (!) 147/80  Pulse: 80 87 78 61  Resp: 17 17 16 19   Temp:  98.3 F (36.8 C)  TempSrc:    Oral  SpO2: 95% 96% 96% 99%  Weight:      Height:       PHYSICAL EXAM General:  Alert, well-nourished, well-developed patient in no acute distress Psych:  Mood and affect appropriate for situation CV: Regular rate and rhythm on monitor (patient has pacemaker, awaiting interrogation) Respiratory:  Regular, unlabored respirations on room air GI: Abdomen soft and nontender  NEURO:  Mental Status: AA&Ox3, patient is able to give clear and coherent history Speech/Language: speech exhibits mild dysarthria. No aphasia.  Naming, repetition, fluency, and comprehension intact.  Cranial Nerves:  II: PERRL. Visual fields full.  III, IV, VI: EOMI. Eyelids elevate symmetrically.  V: Sensation is intact to light touch and symmetrical to face.  VII: left mild facial droop VIII: hearing intact to voice. IX, X: Palate elevates symmetrically. Phonation is normal.  ZO:[id number] shrug 5/5. XII: tongue is midline without fasciculations. Some difficulty moving tongue on left side. Motor: 5/5 strength to all muscle groups tested.  Tone: is normal and bulk is normal Sensation- Intact to light touch bilaterally. Extinction absent to light touch to DSS.   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 Chronic atrophy and small vessel ischemic changes. Small right parafalcine meningioma. CTA head & neck Bulky calcific atherosclerosis at the right carotid bifurcation with an angiographic string sign of the proximal ICA. The ICA remains patent distally. 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 Moderate stenosis of the V2 segment at the C4 level. Non-dominant, diminutive right vertebral artery is poorly opacified and occluded at multiple locations along its course. No visible opacification of the V4 segment. MRI Mixed watershed and embolic infarcts in the right MCA territory involving the right frontal and parietal lobes. No hemorrhage. Echo complete: 08/12/2023 - EF 65 to 70% .  Pacermaker interrogation pending LDL 68 HgbA1c 10.1 VTE prophylaxis - subcutaneous 40 mg aspirin 81 mg daily and cilostazol  prior to admission, now on ASA and plavix DAPT.  Therapy recommendations:  CIR Disposition: pending  Carotid Stenosis, right CTA head & neck 08/30/19 shows high grade stenosis of right ICA bulb Symptomatic this time Vascular surgery consulted, appreciate their recommendations and actions.  Hypertension Home meds:  Norvasc 10, hydralazine, losartan, metoprolol Unstable, fluctuate Avoid low BP BP goal 130-160 before carotid intervention  Hyperlipidemia Home meds:  atorvastatin 40  LDL 68, goal < 70 Increase lipitor to 80 Continue statin at discharge  Diabetes type II Uncontrolled Home meds:  empagliflozin, glimepride HgbA1c 10.1, goal < 7.0 CBGs SSI Recommend close follow-up with PCP for better DM control  Other Stroke Risk Factors ETOH use, alcohol level <10, advised to drink no more than 1 drink(s) a day Coronary artery disease CAD PAD on home ASA and pletal  Other Active Problems Allergic rhinitis Anemia GERD 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 Pt was seen and examined.   RN is at the bedside. Pt lying in bed, AAO x 3. No aphasia, although paucity of speech, following all simple commands, able to name and repeat. Visual field full, no gaze palsy. Left mild facial droop. Slight dysarthria. Moving all extremities.  Sensation and coordination intact.  MRI did show that she had right MCA, ACA and watershed area scattered infarcts, likely due to high grade right ICA stenosis. Pacemaker interrogation only showed short duration of atrial tachycardia vs. Aflutter but no afib. Vascular surgery consulted. Now on ASA and plavix DAPT. Increased lipitor from 40 to 80. PT and OT recommend CIR. Will follow.  For detailed assessment and plan, please refer to above/below as I have made changes wherever appropriate.   Marvel Plan, MD PhD Stroke Neurology 08/30/2023 4:28 PM    To contact Stroke Continuity provider, please refer to [email or web address]. After hours, contact General Neurology

## 2023-08-30 NOTE — Evaluation (Signed)
Clinical/Bedside Swallow Evaluation Patient Details  Name: Amanda Stewart MRN: [id number] Date of Birth: March 06, 1941  Today's Date: 08/30/2023 Time: SLP Start Time (ACUTE ONLY): 1710 SLP Stop Time (ACUTE ONLY): 1722 SLP Time Calculation (min) (ACUTE ONLY): 12 min  Past Medical History:  Past Medical History:  Diagnosis Date   Allergic rhinitis    Anemia    Arrhythmia    CAD (coronary artery disease)    Chest pain    Chronic kidney disease, stage 3a (HCC) 08/11/2023   Combined systolic and diastolic heart failure (HCC)    Diabetes mellitus without complication (HCC)    Diabetic peripheral neuropathy (HCC)    GERD (gastroesophageal reflux disease)    Heart block    Hyperlipidemia    Hypertension    Left bundle branch block    Low back pain    Myocardial infarction (HCC)    Peripheral arterial disease (HCC)    Retinopathy    Past Surgical History:  Past Surgical History:  Procedure Laterality Date   ABDOMINAL HYSTERECTOMY     LEFT AND RIGHT HEART CATHETERIZATION WITH CORONARY ANGIOGRAM N/A 05/26/2014   Procedure: LEFT AND RIGHT HEART CATHETERIZATION WITH CORONARY ANGIOGRAM;  Surgeon: [name], MD;  Location: [facility] CATH LAB;  Service: Cardiovascular;  Laterality: N/A;   PACEMAKER IMPLANT N/A 08/08/2021   Procedure: PACEMAKER IMPLANT;  Surgeon: Lanier Prude, MD;  Location: [facility] INVASIVE CV LAB;  Service: Cardiovascular;  Laterality: N/A;   TEMPORARY PACEMAKER N/A 08/07/2021   Procedure: TEMPORARY PACEMAKER;  Surgeon: Rinaldo Cloud, MD;  Location: [facility] INVASIVE CV LAB;  Service: Cardiovascular;  Laterality: N/A;   HPI:  Amanda Stewart is an 82 yo female presenting to ED 10/2 from home with multiple falls. Per note, daughter noted L facial droop and dysarthria. Recently admitted 9/14-9/16 with generalized weakness and dizziness. MRI Brain with mixed watershed and embolic infarcts in R MCA territory involving R frontal and parietal lobes. PMH includes CAD, chronic diastolic CHF, T2DM,  diabetic polyneuropathy, GERD, HLD, HTN, s/p pacemaker placement, PAD, CKD 3B    Assessment / Plan / Recommendation  Clinical Impression  Pt reports no prior difficulty swallowing. Oral motor exam significant for mild L sided facial and lingual weakness. Pt has very limited dentition. Observed pt with trials of thin liquids, purees, and solids with one throat clear when given mixed consistencies. With solid textures, pt had prolonged mastication and oral residue. This was resolved with use of a liquid wash, which pt initiated independently. Recommend Dys 3 diet due to dentition and continuing thin liquids. Will continue to follow. SLP Visit Diagnosis: Dysphagia, unspecified (R13.10)    Aspiration Risk  Mild aspiration risk    Diet Recommendation Dysphagia 3 (Mech soft);Thin liquid    Liquid Administration via: Cup;Straw Medication Administration: Whole meds with liquid Supervision: Staff to assist with self feeding;Intermittent supervision to cue for compensatory strategies Compensations: Slow rate;Small sips/bites Postural Changes: Seated upright at 90 degrees    Other  Recommendations Oral Care Recommendations: Oral care BID    Recommendations for follow up therapy are one component of a multi-disciplinary discharge planning process, led by the attending physician.  Recommendations may be updated based on patient status, additional functional criteria and insurance authorization.  Follow up Recommendations No SLP follow up      Assistance Recommended at Discharge    Functional Status Assessment Patient has had a recent decline in their functional status and demonstrates the ability to make significant improvements in function in a reasonable  and predictable amount of time.  Frequency and Duration min 2x/week  2 weeks       Prognosis Prognosis for improved oropharyngeal function: Good      Swallow Study   General HPI: Amanda Stewart is an 82 yo female presenting to ED 10/2 from  home with multiple falls. Per note, daughter noted L facial droop and dysarthria. Recently admitted 9/14-9/16 with generalized weakness and dizziness. MRI Brain with mixed watershed and embolic infarcts in R MCA territory involving R frontal and parietal lobes. PMH includes CAD, chronic diastolic CHF, T2DM, diabetic polyneuropathy, GERD, HLD, HTN, s/p pacemaker placement, PAD, CKD 3B Type of Study: Bedside Swallow Evaluation Previous Swallow Assessment: none in chart Diet Prior to this Study: Regular;Thin liquids (Level 0) Temperature Spikes Noted: No Respiratory Status: Room air History of Recent Intubation: No Behavior/Cognition: Alert;Cooperative Oral Cavity Assessment: Within Functional Limits Oral Care Completed by SLP: No Oral Cavity - Dentition: Missing dentition Vision: Functional for self-feeding Self-Feeding Abilities: Needs assist Patient Positioning: Upright in bed Baseline Vocal Quality: Normal Volitional Cough: Strong Volitional Swallow: Able to elicit    Oral/Motor/Sensory Function Overall Oral Motor/Sensory Function: Mild impairment Facial ROM: Reduced left;Suspected CN VII (facial) dysfunction Facial Symmetry: Abnormal symmetry left;Suspected CN VII (facial) dysfunction Facial Strength: Reduced left;Suspected CN VII (facial) dysfunction Facial Sensation: Reduced left;Suspected CN V (Trigeminal) dysfunction Lingual ROM: Reduced left;Suspected CN XII (hypoglossal) dysfunction Lingual Symmetry: Within Functional Limits Lingual Strength: Within Functional Limits Lingual Sensation: Within Functional Limits   Ice Chips Ice chips: Not tested   Thin Liquid Thin Liquid: Within functional limits Presentation: Straw;Self Fed    Nectar Thick Nectar Thick Liquid: Not tested   Honey Thick Honey Thick Liquid: Not tested   Puree Puree: Within functional limits Presentation: Spoon   Solid     Solid: Impaired Presentation: Self Fed Oral Phase Impairments: Impaired  mastication Oral Phase Functional Implications: Oral residue      Gwynneth Aliment, M.A., CF-SLP Speech Language Pathology, Acute Rehabilitation Services  Secure Chat preferred [phone]  08/30/2023,5:29 PM

## 2023-08-31 DIAGNOSIS — I63231 Cerebral infarction due to unspecified occlusion or stenosis of right carotid arteries: Secondary | ICD-10-CM | POA: Diagnosis not present

## 2023-08-31 LAB — CBC
HCT: 37.5 % (ref 36.0–46.0)
Hemoglobin: 12.6 g/dL (ref 12.0–15.0)
MCH: 31.6 pg (ref 26.0–34.0)
MCHC: 33.6 g/dL (ref 30.0–36.0)
MCV: 94 fL (ref 80.0–100.0)
Platelets: 188 10*3/uL (ref 150–400)
RBC: 3.99 MIL/uL (ref 3.87–5.11)
RDW: 14.2 % (ref 11.5–15.5)
WBC: 6.2 10*3/uL (ref 4.0–10.5)
nRBC: 0 % (ref 0.0–0.2)

## 2023-08-31 LAB — GLUCOSE, CAPILLARY
Glucose-Capillary: 116 mg/dL — ABNORMAL HIGH (ref 70–99)
Glucose-Capillary: 129 mg/dL — ABNORMAL HIGH (ref 70–99)
Glucose-Capillary: 131 mg/dL — ABNORMAL HIGH (ref 70–99)
Glucose-Capillary: 145 mg/dL — ABNORMAL HIGH (ref 70–99)
Glucose-Capillary: 236 mg/dL — ABNORMAL HIGH (ref 70–99)
Glucose-Capillary: 279 mg/dL — ABNORMAL HIGH (ref 70–99)
Glucose-Capillary: 285 mg/dL — ABNORMAL HIGH (ref 70–99)

## 2023-08-31 LAB — BASIC METABOLIC PANEL
Anion gap: 12 (ref 5–15)
BUN: 10 mg/dL (ref 8–23)
CO2: 27 mmol/L (ref 22–32)
Calcium: 8.9 mg/dL (ref 8.9–10.3)
Chloride: 102 mmol/L (ref 98–111)
Creatinine, Ser: 1.01 mg/dL — ABNORMAL HIGH (ref 0.44–1.00)
GFR, Estimated: 55 mL/min — ABNORMAL LOW (ref >60)
Glucose, Bld: 128 mg/dL — ABNORMAL HIGH (ref 70–99)
Potassium: 3.2 mmol/L — ABNORMAL LOW (ref 3.5–5.1)
Sodium: 141 mmol/L (ref 135–145)

## 2023-08-31 LAB — MAGNESIUM: Magnesium: 2 mg/dL (ref 1.7–2.4)

## 2023-08-31 MED ORDER — POTASSIUM CHLORIDE CRYS ER 20 MEQ PO TBCR
40.0000 meq | EXTENDED_RELEASE_TABLET | Freq: Once | ORAL | Status: AC
  Administered 2023-08-31: 40 meq via ORAL
  Filled 2023-08-31: qty 2

## 2023-08-31 NOTE — Evaluation (Signed)
Speech Language Pathology Evaluation Patient Details Name: Amanda Stewart MRN: [id number] DOB: 21-Aug-1941 Today's Date: 08/31/2023 Time: 3151-7616 SLP Time Calculation (min) (ACUTE ONLY): 11 min  Problem List:  Patient Active Problem List   Diagnosis Date Noted   Stroke (HCC) 08/29/2023   Physical deconditioning 08/12/2023   History of COVID-19 08/11/2023   Chronic kidney disease, stage 3a (HCC) 08/11/2023   Hypertension associated with diabetes (HCC) 08/11/2023   Hyperlipidemia associated with type 2 diabetes mellitus (HCC) 08/11/2023   PAD (peripheral artery disease) (HCC) 08/11/2023   Pacemaker 11/23/2021   Complete heart block (HCC) 08/07/2021   Hyperkalemia 11/04/2015   Rhabdomyolysis 11/04/2015   CAD (coronary artery disease) 11/04/2015   Transaminitis 11/04/2015   Diabetes mellitus, type 2 (HCC) 11/04/2015   Chest pain 12/10/2014   Chest pain at rest 12/10/2014   Cardiac arrest (HCC) 05/24/2014   Acute respiratory failure with hypoxia (HCC) 05/24/2014   Acute renal insufficiency 05/24/2014   Past Medical History:  Past Medical History:  Diagnosis Date   Allergic rhinitis    Anemia    Arrhythmia    CAD (coronary artery disease)    Chest pain    Chronic kidney disease, stage 3a (HCC) 08/11/2023   Combined systolic and diastolic heart failure (HCC)    Diabetes mellitus without complication (HCC)    Diabetic peripheral neuropathy (HCC)    GERD (gastroesophageal reflux disease)    Heart block    Hyperlipidemia    Hypertension    Left bundle branch block    Low back pain    Myocardial infarction (HCC)    Peripheral arterial disease (HCC)    Retinopathy    Past Surgical History:  Past Surgical History:  Procedure Laterality Date   ABDOMINAL HYSTERECTOMY     LEFT AND RIGHT HEART CATHETERIZATION WITH CORONARY ANGIOGRAM N/A 05/26/2014   Procedure: LEFT AND RIGHT HEART CATHETERIZATION WITH CORONARY ANGIOGRAM;  Surgeon: [name], MD;  Location: [facility] CATH LAB;   Service: Cardiovascular;  Laterality: N/A;   PACEMAKER IMPLANT N/A 08/08/2021   Procedure: PACEMAKER IMPLANT;  Surgeon: Lanier Prude, MD;  Location: [facility] INVASIVE CV LAB;  Service: Cardiovascular;  Laterality: N/A;   TEMPORARY PACEMAKER N/A 08/07/2021   Procedure: TEMPORARY PACEMAKER;  Surgeon: Rinaldo Cloud, MD;  Location: [facility] INVASIVE CV LAB;  Service: Cardiovascular;  Laterality: N/A;   HPI:  Amanda Stewart is an 82 yo female presenting to ED 10/2 from home with multiple falls. Per note, daughter noted L facial droop and dysarthria. Recently admitted 9/14-9/16 with generalized weakness and dizziness. MRI Brain with mixed watershed and embolic infarcts in R MCA territory involving R frontal and parietal lobes. PMH includes CAD, chronic diastolic CHF, T2DM, diabetic polyneuropathy, GERD, HLD, HTN, s/p pacemaker placement, PAD, CKD 3B   Assessment / Plan / Recommendation Clinical Impression  Pt reports that she lives at home alone where she handles all ADLs independently. She states that she typically wears bilateral hearing aides and eyeglasses, although neither are currently present which has the potential to impact her performance on functional cognitive tasks. Due to vision deficits, pt unable to complete the clock drawing portion of the SLUMS. Of the remaining 26 points, pt scored 24 with points deducted for a simple subtraction task. Suspect pt is at her baseline, although may benefit from increased supervision for complex cognitive tasks. Observed pt ordering her lunch from menu independently. Additionally, pt not exhibiting signs of R hemisphere disorder; she is engaging in conversation with this SLP and  initiation appears WFL. Provided education regarding increased assistance and supervision with pt verbalizing agreement. No further SLP f/u is needed at this time. Will s/o.    SLP Assessment  SLP Recommendation/Assessment: Patient does not need any further Speech Lanaguage Pathology  Services SLP Visit Diagnosis: Cognitive communication deficit (R41.841)    Recommendations for follow up therapy are one component of a multi-disciplinary discharge planning process, led by the attending physician.  Recommendations may be updated based on patient status, additional functional criteria and insurance authorization.    Follow Up Recommendations  No SLP follow up    Assistance Recommended at Discharge  Frequent or constant Supervision/Assistance  Functional Status Assessment Patient has not had a recent decline in their functional status  Frequency and Duration           SLP Evaluation Cognition  Overall Cognitive Status: Within Functional Limits for tasks assessed Orientation Level: Oriented X4       Comprehension  Auditory Comprehension Overall Auditory Comprehension: Appears within functional limits for tasks assessed    Expression Expression Primary Mode of Expression: Verbal Verbal Expression Overall Verbal Expression: Appears within functional limits for tasks assessed Written Expression Dominant Hand: Right   Oral / Motor  Oral Motor/Sensory Function Overall Oral Motor/Sensory Function: Mild impairment Facial ROM: Reduced left;Suspected CN VII (facial) dysfunction Facial Symmetry: Abnormal symmetry left;Suspected CN VII (facial) dysfunction Facial Strength: Reduced left;Suspected CN VII (facial) dysfunction Facial Sensation: Reduced left;Suspected CN V (Trigeminal) dysfunction Lingual ROM: Reduced left;Suspected CN XII (hypoglossal) dysfunction Lingual Symmetry: Within Functional Limits Lingual Strength: Within Functional Limits Lingual Sensation: Within Functional Limits Motor Speech Overall Motor Speech: Appears within functional limits for tasks assessed            Gwynneth Aliment, M.A., CF-SLP Speech Language Pathology, Acute Rehabilitation Services  Secure Chat preferred [phone]  08/31/2023, 11:37 AM

## 2023-08-31 NOTE — Progress Notes (Addendum)
  Daily Progress Note  S/p: n/a.  Patient with symptomatic right ICA severe greater than 80% stenosis with bulky calcific plaque.  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 We discussed stroke risk going forward with medical management versus carotid intervention. I discussed the risks and benefits with the patient and her daughter and we will tentatively plan for TCAR with possible lithotripsy on Tuesday 10/8   Daria Pastures MD MS [facility] [phone] 08/31/2023  5:40 PM

## 2023-08-31 NOTE — NC FL2 (Cosign Needed Addendum)
[place] MEDICAID FL2 LEVEL OF CARE FORM     IDENTIFICATION  Patient Name: Amanda Stewart Birthdate: 12/28/1940 Sex: female Admission Date (Current Location): 08/29/2023  [facility] and [place] Number:  Producer, television/film/video and Address:  [facility] [place], [address] [place], [place] [address]      Provider Number: [id number]  Attending Physician Name and Address:  Joycelyn Das, MD  Relative Name and Phone Number:       Current Level of Care: SNF Recommended Level of Care: Skilled Nursing Facility Prior Approval Number:    Date Approved/Denied:   PASRR Number: [id number]  Discharge Plan: SNF    Current Diagnoses: Patient Active Problem List   Diagnosis Date Noted   Stroke [facility]) 08/29/2023   Physical deconditioning 08/12/2023   History of COVID-19 08/11/2023   Chronic kidney disease, stage 3a (HCC) 08/11/2023   Hypertension associated with diabetes (HCC) 08/11/2023   Hyperlipidemia associated with type 2 diabetes mellitus (HCC) 08/11/2023   PAD (peripheral artery disease) (HCC) 08/11/2023   Pacemaker 11/23/2021   Complete heart block (HCC) 08/07/2021   Hyperkalemia 11/04/2015   Rhabdomyolysis 11/04/2015   CAD (coronary artery disease) 11/04/2015   Transaminitis 11/04/2015   Diabetes mellitus, type 2 (HCC) 11/04/2015   Chest pain 12/10/2014   Chest pain at rest 12/10/2014   Cardiac arrest (HCC) 05/24/2014   Acute respiratory failure with hypoxia (HCC) 05/24/2014   Acute renal insufficiency 05/24/2014    Orientation RESPIRATION BLADDER Height & Weight     Self, Time, Situation, Place  Normal Incontinent Weight: 172 lb 13.5 oz (78.4 kg) Height:  5\' 5"  (165.1 cm)  BEHAVIORAL SYMPTOMS/MOOD NEUROLOGICAL BOWEL NUTRITION STATUS      Incontinent Diet (See DC Summary)  AMBULATORY STATUS COMMUNICATION OF NEEDS Skin   Limited Assist   Normal                       Personal Care Assistance Level of Assistance  Bathing, Feeding, Dressing  Bathing Assistance: Maximum assistance Feeding assistance: Limited assistance Dressing Assistance: Limited assistance     Functional Limitations Info  Hearing   Hearing Info: Impaired      SPECIAL CARE FACTORS FREQUENCY  PT (By licensed PT), OT (By licensed OT), Speech therapy     PT Frequency: 5x/week OT Frequency: 5x/week     Speech Therapy Frequency: 5x/week      Contractures      Additional Factors Info  Code Status, Allergies Code Status Info: Full Allergies Info: Metformin And Related           Current Medications (08/31/2023):  This is the current hospital active medication list Current Facility-Administered Medications  Medication Dose Route Frequency Provider Last Rate Last Admin   acetaminophen (TYLENOL) tablet 650 mg  650 mg Oral Q6H PRN Darlin Drop, DO   650 mg at 08/31/23 3244   aspirin EC tablet 81 mg  81 mg Oral Daily Dow Adolph N, DO   81 mg at 08/31/23 0102   atorvastatin (LIPITOR) tablet 80 mg  80 mg Oral Daily Marvel Plan, MD   80 mg at 08/31/23 0853   clopidogrel (PLAVIX) tablet 75 mg  75 mg Oral Daily Marvel Plan, MD   75 mg at 08/31/23 0852   dorzolamide (TRUSOPT) 2 % ophthalmic solution 1 drop  1 drop Both Eyes BID Dow Adolph N, DO   1 drop at 08/31/23 0855   enoxaparin (LOVENOX) injection 40 mg  40  mg Subcutaneous Q24H Dow Adolph N, DO   40 mg at 08/30/23 2325   hydrALAZINE (APRESOLINE) injection 10 mg  10 mg Intravenous Q6H PRN Pokhrel, Laxman, MD       insulin aspart (novoLOG) injection 0-9 Units  0-9 Units Subcutaneous Q4H Dow Adolph N, DO   5 Units at 08/31/23 1209   melatonin tablet 5 mg  5 mg Oral QHS PRN Darlin Drop, DO   5 mg at 08/30/23 2106   multivitamin with minerals tablet 1 tablet  1 tablet Oral Daily Dow Adolph N, DO   1 tablet at 08/31/23 0852   pantoprazole (PROTONIX) EC tablet 40 mg  40 mg Oral Q0600 Darlin Drop, DO   40 mg at 08/31/23 9811   polyethylene glycol (MIRALAX / GLYCOLAX) packet 17 g  17 g Oral Daily  PRN Dow Adolph N, DO       prochlorperazine (COMPAZINE) injection 5 mg  5 mg Intravenous Q6H PRN Dow Adolph N, DO       sodium chloride flush (NS) 0.9 % injection 3 mL  3 mL Intravenous Once Glyn Ade, MD         Discharge Medications: Please see discharge summary for a list of discharge medications.  Relevant Imaging Results:  Relevant Lab Results:   Additional Information SSN: [id number]  Antion Felipa [place], Student-Social Work

## 2023-08-31 NOTE — Plan of Care (Signed)

## 2023-08-31 NOTE — Progress Notes (Signed)
STROKE TEAM PROGRESS NOTE   BRIEF HPI [name] is a 82 y.o. female with history of  CAD, DM2, HTN, HLD, PAD treated with aspirin and cilostazol presenting with approximately 3 weeks of multiple falls, L sided facial droop, headaches, and slurred speech. Last fall 5 pm on 10/1 prior to admission. LKW not known, no thrombolytics and no IR intervention.   SIGNIFICANT HOSPITAL EVENTS 10/2 - Admitted to ED 10/3 - MRI, vascular surgery consulted  INTERIM HISTORY/SUBJECTIVE No family at bedside.  Patient lying in bed, awake alert, orientated fully.  Left facial droop has improved.  Vascular surgery on board, considering right TCAR with lithotripsy next Tuesday.   OBJECTIVE  CBC    Component Value Date/Time   WBC 6.2 08/31/2023 0657   RBC 3.99 08/31/2023 0657   HGB 12.6 08/31/2023 0657   HCT 37.5 08/31/2023 0657   PLT 188 08/31/2023 0657   MCV 94.0 08/31/2023 0657   MCH 31.6 08/31/2023 0657   MCHC 33.6 08/31/2023 0657   RDW 14.2 08/31/2023 0657   LYMPHSABS 1.4 08/29/2023 1630   MONOABS 0.5 08/29/2023 1630   EOSABS 0.0 08/29/2023 1630   BASOSABS 0.1 08/29/2023 1630    BMET    Component Value Date/Time   NA 141 08/31/2023 0657   K 3.2 (L) 08/31/2023 0657   CL 102 08/31/2023 0657   CO2 27 08/31/2023 0657   GLUCOSE 128 (H) 08/31/2023 0657   BUN 10 08/31/2023 0657   CREATININE 1.01 (H) 08/31/2023 0657   CREATININE 1.25 (H) 12/22/2022 0000   CALCIUM 8.9 08/31/2023 0657   EGFR 43 (L) 12/22/2022 0000   GFRNONAA 55 (L) 08/31/2023 0657   GFRNONAA 59 (L) 01/05/2021 0948    IMAGING past 24 hours No results found.  Vitals:   08/31/23 0408 08/31/23 0805 08/31/23 1203 08/31/23 1600  BP: 133/69 (!) 152/67 120/64 122/71  Pulse: 86 82 76 98  Resp:  18 18 20   Temp: 98.2 F (36.8 C) 98.3 F (36.8 C) 98.2 F (36.8 C) 98.5 F (36.9 C)  TempSrc: Oral Oral Oral Oral  SpO2: 96% 97% 96% 100%  Weight:      Height:       PHYSICAL EXAM General:  Alert, well-nourished,  well-developed patient in no acute distress Psych:  Mood and affect appropriate for situation CV: Regular rate and rhythm on monitor (patient has pacemaker, awaiting interrogation) Respiratory:  Regular, unlabored respirations on room air GI: Abdomen soft and nontender  NEURO:  Mental Status: AA&Ox3, patient is able to give clear and coherent history Speech/Language: speech exhibits mild dysarthria. No aphasia.  Naming, repetition, fluency, and comprehension intact.  Cranial Nerves:  II: PERRL. Visual fields full.  III, IV, VI: EOMI. Eyelids elevate symmetrically.  V: Sensation is intact to light touch and symmetrical to face.  VII: left mild facial droop VIII: hearing intact to voice. IX, X: Palate elevates symmetrically. Phonation is normal.  MV:HQIONGEX shrug 5/5. XII: tongue is midline without fasciculations. Motor: 5/5 strength to all muscle groups tested.  Tone: is normal and bulk is normal Sensation- Intact to light touch bilaterally. Extinction absent to light touch to DSS.   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 Chronic atrophy and small vessel ischemic changes. Small right parafalcine meningioma. CTA head & neck Bulky calcific atherosclerosis at the right carotid bifurcation with an angiographic string  sign of the proximal ICA. The ICA remains patent distally. 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 Moderate stenosis of the V2 segment at the C4 level. Non-dominant, diminutive right vertebral artery is poorly opacified and occluded at multiple locations along its course. No visible opacification of the V4 segment. MRI Mixed watershed and embolic infarcts in the right MCA territory involving the right frontal and parietal lobes. No  hemorrhage. Echo complete: 08/12/2023 - EF 65 to 70% .  Pacermaker interrogation multiple sinus tachycardia with PACs and very short lasting a flutter, but no A-fib. LDL 68 HgbA1c 10.1 VTE prophylaxis - subcutaneous 40 mg aspirin 81 mg daily and cilostazol  prior to admission, now on ASA and plavix DAPT in preparation for TCAR.  Therapy recommendations:  CIR vs. SNF Disposition: pending  Carotid Stenosis, right CTA head & neck 08/30/19 shows high grade stenosis of right ICA bulb Symptomatic this time Vascular surgery consulted Plan for next Tuesday right TCAR with lithotripsy On DAPT  Hypertension Home meds:  Norvasc 10, hydralazine, losartan, metoprolol Unstable, fluctuate Avoid low BP BP goal 130-160 before carotid intervention  Hyperlipidemia Home meds:  atorvastatin 40  LDL 68, goal < 70 Increase lipitor to 80 Continue statin at discharge  Diabetes type II Uncontrolled Home meds:  empagliflozin, glimepride HgbA1c 10.1, goal < 7.0 CBGs SSI Recommend close follow-up with PCP for better DM control  Other Stroke Risk Factors ETOH use, alcohol level <10, advised to drink no more than 1 drink(s) a day Coronary artery disease CAD PAD on home ASA and pletal  Other Active Problems Allergic rhinitis Anemia GERD Heart Block on pacemaker Retinopathy  Hospital day # 2  Neurology will sign off. Please call with questions. Pt will follow up with stroke clinic NP at [facility] in about 4 weeks. Thanks for the consult.   Marvel Plan, MD PhD Stroke Neurology 08/31/2023 6:10 PM    To contact Stroke Continuity provider, please refer to [email or web address]. After hours, contact General Neurology

## 2023-08-31 NOTE — Progress Notes (Addendum)
PROGRESS NOTE    Amanda Stewart  NFA:[id number] DOB: 07-13-1941 DOA: 08/29/2023 PCP: Fleet Contras, MD    Brief Narrative:    Amanda Stewart is a 82 y.o. female with medical history significant for coronary artery disease, chronic diastolic CHF, type 2 diabetes, diabetic polyneuropathy, hyperlipidemia, hypertension, LBBB, status post pacemaker placement, peripheral artery disease on Aspirin and Pletal, CKD 3B, who presented from home after multiple falls and generalized weakness.  Patient daughter noted left facial droop and slurred speech 1 day prior to presentation.  Patient was recently admitted from 08/11/2023 through 08/13/2023 due to generalized weakness, dizziness, found to have orthostatic hypotension, was discharged to home with home health PT OT.  In the ED on this presentation, patient was seen by neurology and stroke team.  CT head and cervical spine was negative.  Vitals were stable.  Labs remarkable for elevated glucose at 223 creatinine of 1.2.  Patient was then considered for admission to hospital for further evaluation and treatment.  Assessment and plan.  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 Had acute left facial and left-sided weakness.  CT head negative.  Admitted for stroke workup.  CT angiogram of the head and neck without any intracranial occlusion.  MRI of the brain showed a mixed watershed and embolic infarct in the right MCA territory.  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  2D echo, most recent 08/12/23 showed LV ejection fraction of 65 to 70% with LVH.  Lipid panel within normal range.neurology on board.  Pacemaker interrogation was done.  Continue aspirin and Plavix.  PT has  recommended CIR placement at this time.  Vascular surgery has been consulted for further plans, will follow recommendation.Marland Kitchen  History of pacemaker placement.  Interrogation done.   Hypokalemia.  Potassium today at 3.2.  Will replenish orally.  Check levels in AM.  Type 2 diabetes with hyperglycemia Hemoglobin A1c, most recent 10.1 on 08/12/23.  Continue diabetic diet, insulin sliding scale.  Latest POC glucose of 131.   CKD 3B At baseline.  Latest creatinine at 1.0.   HFpEF Continue intake and output charting Daily weights.  Recent 2D echocardiogram with preserved LV function.   Generalized weakness Frequent falls Continue fall precautions.  PT has recommended CIR placement.     DVT prophylaxis: enoxaparin (LOVENOX) injection 40 mg Start: 08/29/23 2315   Code Status:     Code Status: Full Code  Disposition: Likely to CIR.  Status is: Inpatient  Remains inpatient appropriate because: Stroke, vascular surgery evaluation, neurology follow-up   Family Communication:  Spoke with the daughter on the phone on 08/30/2023.   Consultants:  Neurology Vascular surgery  Procedures:  Pacemaker interrogation  Antimicrobials:  None  Anti-infectives (From admission, onward)    None       Subjective: Today, patient was seen and examined at bedside.  Complains of mild headache despite Tylenol.  Denies any nausea vomiting fever chills or rigor.  Feels like some throat irritation and congestion.  Objective: Vitals:   08/30/23 2018 08/30/23 2326 08/31/23 0408 08/31/23 0805  BP: 126/89 130/71 133/69 (!) 152/67  Pulse: 98 86 86 82  Resp: 18   18  Temp: 98.8 F (37.1 C) 98.6 F (37 C) 98.2 F (36.8 C) 98.3  F (36.8 C)  TempSrc: Oral Oral Oral Oral  SpO2: 100% 99% 96% 97%  Weight:      Height:        Intake/Output Summary (Last 24 hours) at 08/31/2023 1105 Last data filed at 08/30/2023 2326 Gross per 24 hour  Intake 120 ml  Output 500 ml  Net -380 ml   Filed Weights    08/29/23 1621  Weight: 78.4 kg    Physical Examination: Body mass index is 28.76 kg/m.   General:  Average built, not in obvious distress, hard of hearing, elderly female, Communicative HENT:   No scleral pallor or icterus noted. Oral mucosa is moist.  Chest:  .  Diminished breath sounds bilaterally. No crackles or wheezes.  CVS: S1 &S2 heard. No murmur.  Regular rate and rhythm. Abdomen: Soft, nontender, nondistended.  Bowel sounds are heard.   Extremities: No cyanosis, clubbing or edema.  Peripheral pulses are palpable. Psych: Alert, awake and oriented, normal mood CNS: Communicative, follows commands, mild left facial droop Skin: Warm and dry.  No rashes noted.  Moving all extremities.  Data Reviewed:   CBC: Recent Labs  Lab 08/29/23 1630 08/29/23 1637 08/31/23 0657  WBC 8.0  --  6.2  NEUTROABS 6.0  --   --   HGB 12.7 12.9 12.6  HCT 38.6 38.0 37.5  MCV 97.7  --  94.0  PLT 196  --  188    Basic Metabolic Panel: Recent Labs  Lab 08/29/23 1630 08/29/23 1637 08/31/23 0657  NA 141 141 141  K 3.6 3.6 3.2*  CL 107 109 102  CO2 20*  --  27  GLUCOSE 282* 283* 128*  BUN 17 17 10   CREATININE 1.18* 1.20* 1.01*  CALCIUM 9.5  --  8.9  MG  --   --  2.0    Liver Function Tests: Recent Labs  Lab 08/29/23 1630  AST 29  ALT 30  ALKPHOS 118  BILITOT 0.7  PROT 6.9  ALBUMIN 3.9     Radiology Studies: MR BRAIN WO CONTRAST  Result Date: 08/30/2023 CLINICAL DATA:  Neuro deficit, acute, stroke suspected EXAM: MRI HEAD WITHOUT CONTRAST TECHNIQUE: Multiplanar, multiecho pulse sequences of the brain and surrounding structures were obtained without intravenous contrast. COMPARISON:  Same day CTA head/neck angiogram.  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 No hemorrhage. No hydrocephalus. No extra-axial fluid collection. Sequela of moderate chronic microvascular ischemic  change. Likely a 2.0 x 0.6 x 1.4 cm meningioma along the falx. This was present on prior head CT from 05/30/2014. Vascular: Normal flow voids. See same day CTA head and neck angiogram. Skull and upper cervical spine: Normal marrow signal. Sinuses/Orbits: No middle ear or mastoid effusion. Paranasal sinuses are grossly clear. Bilateral lens replacement. Orbits are otherwise unremarkable. Other: None. 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 No hemorrhage. Electronically Signed   By: Lorenza Cambridge M.D.   On: 08/30/2023 13:06   CT ANGIO HEAD NECK W WO CM  Result Date: 08/30/2023 CLINICAL DATA:  Dizziness EXAM: CT ANGIOGRAPHY HEAD AND NECK WITH AND WITHOUT CONTRAST TECHNIQUE: Multidetector CT imaging of the head and neck was performed using the standard protocol during bolus administration of intravenous contrast. Multiplanar CT image reconstructions and MIPs were obtained to evaluate the vascular anatomy. Carotid stenosis measurements (when applicable) are obtained utilizing NASCET criteria, using the  distal internal carotid diameter as the denominator. RADIATION DOSE REDUCTION: This exam was performed according to the departmental dose-optimization program which includes automated exposure control, adjustment of the mA and/or kV according to patient size and/or use of iterative reconstruction technique. CONTRAST:  75mL OMNIPAQUE IOHEXOL 350 MG/ML SOLN COMPARISON:  None Available. FINDINGS: CTA NECK FINDINGS SKELETON: No acute abnormality or high grade bony spinal canal stenosis. OTHER NECK: Normal pharynx, larynx and major salivary glands. No cervical lymphadenopathy. Unremarkable thyroid gland. UPPER CHEST: No pneumothorax or pleural effusion. No nodules or masses. AORTIC ARCH: There is calcific atherosclerosis of the aortic arch. Conventional 3 vessel aortic branching pattern. RIGHT CAROTID SYSTEM: There is bulky calcific  atherosclerosis at the right carotid bifurcation with an angiographic string sign of the proximal ICA. The ICA remains patent distally. LEFT CAROTID SYSTEM: Mild atherosclerosis within the common carotid artery and at the carotid bifurcation. No hemodynamically significant stenosis. VERTEBRAL ARTERIES: Left dominant configuration. There is moderate stenosis of the left vertebral artery at the C4 level. 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 The right vertebral artery is markedly diminutive along its entire course with minimal opacification. CTA HEAD FINDINGS POSTERIOR CIRCULATION: --Vertebral arteries: The left V4 segment is normal aside from mild calcification. The right V4 segment is non-opacified. --Inferior cerebellar arteries: Normal. --Basilar artery: Normal. --Superior cerebellar arteries: Normal. --Posterior cerebral arteries (PCA): Normal. ANTERIOR CIRCULATION: --Intracranial internal carotid arteries: Atherosclerotic calcification of the internal carotid arteries at the skull base without hemodynamically significant stenosis. --Anterior cerebral arteries (ACA): Normal. --Middle cerebral arteries (MCA): Normal. VENOUS SINUSES: As permitted by contrast timing, patent. ANATOMIC VARIANTS: None Review of the MIP images confirms the above findings. IMPRESSION: 1. No intracranial arterial occlusion or high-grade stenosis. 2. Bulky calcific atherosclerosis at the right carotid bifurcation with an angiographic string sign of the proximal ICA. The ICA remains patent distally. 3. 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 Moderate stenosis of the V2 segment at the C4 level. 4. Non-dominant, diminutive right vertebral artery is poorly opacified and occluded at multiple locations along its course. No visible opacification of the V4 segment. Aortic Atherosclerosis (ICD10-I70.0). Electronically Signed   By:  Deatra Robinson M.D.   On: 08/30/2023 03:39   DG Chest 2 View  Result Date: 08/29/2023 CLINICAL DATA:  Altered mental status. Fell yesterday. Facial droop and slurred speech. Headache and generalized weakness. EXAM: CHEST - 2 VIEW COMPARISON:  08/11/2023 FINDINGS: Cardiac pacemaker. Heart size and pulmonary vascularity are normal. Lungs are clear. No pleural effusions. No pneumothorax. Mediastinal contours appear intact. Degenerative changes in the spine and shoulders. IMPRESSION: No active cardiopulmonary disease. Electronically Signed   By: Burman Nieves M.D.   On: 08/29/2023 19:12   CT HEAD WO CONTRAST ( )  Result Date: 08/29/2023 CLINICAL DATA:  Minor head trauma.  Dizziness.  Poly trauma, blunt. EXAM: CT HEAD WITHOUT CONTRAST CT CERVICAL SPINE WITHOUT CONTRAST TECHNIQUE: Multidetector CT imaging of the head and cervical spine was performed following the standard protocol without intravenous contrast. Multiplanar CT image reconstructions of the cervical spine were also generated. RADIATION DOSE REDUCTION: This exam was performed according to the departmental dose-optimization program which includes automated exposure control, adjustment of the mA and/or kV according to patient size and/or use of iterative reconstruction technique. COMPARISON:  CT head 05/30/2014 FINDINGS: CT HEAD FINDINGS Brain: Diffuse cerebral atrophy. Mild ventricular dilatation consistent with central atrophy. Low-attenuation changes in the  deep white matter consistent with small vessel ischemia. Old calcified extra-axial lesion along the anterior falx to the right measuring 0.8 x 1.9 cm. This is likely a meningioma. Mild enlargement since previous study. No mass-effect or midline shift. No abnormal extra-axial fluid collections. Gray-white matter junctions are distinct. Basal cisterns are not effaced. No acute intracranial hemorrhage. Vascular: No hyperdense vessel or unexpected calcification. Skull: Normal. Negative for fracture  or focal lesion. Sinuses/Orbits: No acute finding. Other: None. CT CERVICAL SPINE FINDINGS Alignment: Normal alignment. Skull base and vertebrae: No acute fracture. No primary bone lesion or focal pathologic process. Soft tissues and spinal canal: No prevertebral fluid or swelling. No visible canal hematoma. Disc levels: Degenerative changes throughout with narrowed disc space interspaces and associated endplate osteophyte formation. Moderately prominent disc osteophyte complexes at C2-3, C4-5, and C6-7 causing some encroachment upon the central canal. Uncovertebral and facet joint spurring causes some encroachment upon the neural foramina bilaterally. Upper chest: Lung apices are clear. Other: None. IMPRESSION: 1. No acute intracranial abnormalities. Chronic atrophy and small vessel ischemic changes. Small right parafalcine meningioma. 2. Normal alignment of the cervical spine. No acute displaced fractures identified. Diffuse degenerative change. Electronically Signed   By: Burman Nieves M.D.   On: 08/29/2023 19:10   CT CERVICAL SPINE WO CONTRAST  Result Date: 08/29/2023 CLINICAL DATA:  Minor head trauma.  Dizziness.  Poly trauma, blunt. EXAM: CT HEAD WITHOUT CONTRAST CT CERVICAL SPINE WITHOUT CONTRAST TECHNIQUE: Multidetector CT imaging of the head and cervical spine was performed following the standard protocol without intravenous contrast. Multiplanar CT image reconstructions of the cervical spine were also generated. RADIATION DOSE REDUCTION: This exam was performed according to the departmental dose-optimization program which includes automated exposure control, adjustment of the mA and/or kV according to patient size and/or use of iterative reconstruction technique. COMPARISON:  CT head 05/30/2014 FINDINGS: CT HEAD FINDINGS Brain: Diffuse cerebral atrophy. Mild ventricular dilatation consistent with central atrophy. Low-attenuation changes in the deep white matter consistent with small vessel ischemia.  Old calcified extra-axial lesion along the anterior falx to the right measuring 0.8 x 1.9 cm. This is likely a meningioma. Mild enlargement since previous study. No mass-effect or midline shift. No abnormal extra-axial fluid collections. Gray-white matter junctions are distinct. Basal cisterns are not effaced. No acute intracranial hemorrhage. Vascular: No hyperdense vessel or unexpected calcification. Skull: Normal. Negative for fracture or focal lesion. Sinuses/Orbits: No acute finding. Other: None. CT CERVICAL SPINE FINDINGS Alignment: Normal alignment. Skull base and vertebrae: No acute fracture. No primary bone lesion or focal pathologic process. Soft tissues and spinal canal: No prevertebral fluid or swelling. No visible canal hematoma. Disc levels: Degenerative changes throughout with narrowed disc space interspaces and associated endplate osteophyte formation. Moderately prominent disc osteophyte complexes at C2-3, C4-5, and C6-7 causing some encroachment upon the central canal. Uncovertebral and facet joint spurring causes some encroachment upon the neural foramina bilaterally. Upper chest: Lung apices are clear. Other: None. IMPRESSION: 1. No acute intracranial abnormalities. Chronic atrophy and small vessel ischemic changes. Small right parafalcine meningioma. 2. Normal alignment of the cervical spine. No acute displaced fractures identified. Diffuse degenerative change. Electronically Signed   By: Burman Nieves M.D.   On: 08/29/2023 19:10      LOS: 2 days    Joycelyn Das, MD [facility] Available via Epic secure chat 7am-7pm After these hours, please refer to coverage provider listed on [email or web address] 08/31/2023, 11:05 AM

## 2023-08-31 NOTE — TOC Initial Note (Addendum)
Transition of Care [facility]) - Initial/Assessment Note    Patient Details  Name: Amanda Stewart MRN: [id number] Date of Birth: [birth year]  [facility] CM/SW Contact:    Baldemar Lenis, LCSW Phone Number: 08/31/2023, 3:53 PM  Clinical Narrative:      CSW updated by rehab admissions that patient will need SNF placement due to lack of caregiver support. CSW completed referral and faxed out, attempted to discuss with daughter but call is unable to be completed to the phone listed. Per Rehab admissions, the number worked earlier today. CSW to continue to attempt to contact daughter to provide bed offers for SNF.        UPDATE: CSW spoke with daughter, Amanda Stewart, about SNF placement and discuss CMS list. She has no preferences. CSW explained process and Amanda Stewart is in agreement. CSW to follow.       Expected Discharge Plan: Skilled Nursing Facility Barriers to Discharge: Continued Medical Work up, English as a second language teacher   Patient Goals and CMS Choice Patient states their goals for this hospitalization and ongoing recovery are:: to get rehab CMS Medicare.gov Compare Post Acute Care list provided to:: Patient Choice offered to / list presented to : Patient, Adult Children      Expected Discharge Plan and Services     Post Acute Care Choice: Skilled Nursing Facility Living arrangements for the past 2 months: Apartment                                      Prior Living Arrangements/Services Living arrangements for the past 2 months: Apartment Lives with:: Self Patient language and need for interpreter reviewed:: No Do you feel safe going back to the place where you live?: Yes      Need for Family Participation in Patient Care: Yes (Comment) Care giver support system in place?: No (comment)   Criminal Activity/Legal Involvement Pertinent to Current Situation/Hospitalization: No - Comment as needed  Activities of Daily Living      Permission  Sought/Granted Permission sought to share information with : Facility Medical sales representative, Family Supports Permission granted to share information with : Yes, Verbal Permission Granted  Share Information with NAME: Amanda Stewart  Permission granted to share info w AGENCY: SNF  Permission granted to share info w Relationship: Daughter     Emotional Assessment   Attitude/Demeanor/Rapport: Engaged Affect (typically observed): Appropriate Orientation: : Oriented to Self, Oriented to Place, Oriented to  Time, Oriented to Situation Alcohol / Substance Use: Not Applicable Psych Involvement: No (comment)  Admission diagnosis:  Falls frequently [R29.6] Stroke [facility]) [I63.9] Stroke-like symptoms [R29.90] Patient Active Problem List   Diagnosis Date Noted   Stroke (HCC) 08/29/2023   Physical deconditioning 08/12/2023   History of COVID-19 08/11/2023   Chronic kidney disease, stage 3a (HCC) 08/11/2023   Hypertension associated with diabetes (HCC) 08/11/2023   Hyperlipidemia associated with type 2 diabetes mellitus (HCC) 08/11/2023   PAD (peripheral artery disease) (HCC) 08/11/2023   Pacemaker 11/23/2021   Complete heart block (HCC) 08/07/2021   Hyperkalemia 11/04/2015   Rhabdomyolysis 11/04/2015   CAD (coronary artery disease) 11/04/2015   Transaminitis 11/04/2015   Diabetes mellitus, type 2 (HCC) 11/04/2015   Chest pain 12/10/2014   Chest pain at rest 12/10/2014   Cardiac arrest (HCC) 05/24/2014   Acute respiratory failure with hypoxia (HCC) 05/24/2014   Acute renal insufficiency 05/24/2014   PCP:  Fleet Contras, MD Pharmacy:  [id number] [place], [place] - [address] AT [facility] OF [address] [place] [address] Phone: [phone] Fax: [phone]     Social Determinants of Health (SDOH) Social History: SDOH Screenings   Food Insecurity: No Food Insecurity (08/12/2023)  Housing: Low Risk  (08/12/2023)   Transportation Needs: No Transportation Needs (08/12/2023)  Utilities: Not At Risk (08/12/2023)  Tobacco Use: Low Risk  (08/29/2023)   SDOH Interventions:     Readmission Risk Interventions     No data to display

## 2023-08-31 NOTE — Progress Notes (Signed)
  Inpatient Rehabilitation Admissions Coordinator   Met with patient at bedside for rehab assessment and then spoke with her two daughters by phone. Patient currently lives alone and has aide 2 hrs per day. Daughters both work. They would eventually like to arrange increased PCS services at home and for Mom to live with one of the daughters. Patient has fallen 9 times in the past month and I have recommended 24/7 caregiver supports once she returns home after any rehab. I recommend SNF level rehab at this time. I will alert acute team and TOC. We will sign off at this time. Please call me with any questions.   Ottie Glazier, RN, MSN Rehab Admissions Coordinator [phone]

## 2023-08-31 NOTE — Progress Notes (Signed)
Speech Language Pathology Treatment: Dysphagia  Patient Details Name: Amanda Stewart MRN: [id number] DOB: 1941/07/19 Today's Date: 08/31/2023 Time: 4098-1191 SLP Time Calculation (min) (ACUTE ONLY): 11 min  Assessment / Plan / Recommendation Clinical Impression  Pt reports no difficulty with swallowing during dinner or breakfast. She reports subjective sensation of phlegm and congestion which she states she typically takes medication for at home which is currently unavailable. She reports it causes a globus sensation, although does not inhibit swallowing function. Observed pt with trials of thin liquids and Dys 3 texture solids with no overt s/s of dysphagia or aspiration. Pt agrees that Dys 3 remains most appropriate due to missing dentition. Overall, pt appears to be at her baseline for swallowing and no further SLP f/u is needed at this time. Will s/o.   HPI HPI: Amanda Stewart is an 82 yo female presenting to ED 10/2 from home with multiple falls. Per note, daughter noted L facial droop and dysarthria. Recently admitted 9/14-9/16 with generalized weakness and dizziness. MRI Brain with mixed watershed and embolic infarcts in R MCA territory involving R frontal and parietal lobes. PMH includes CAD, chronic diastolic CHF, T2DM, diabetic polyneuropathy, GERD, HLD, HTN, s/p pacemaker placement, PAD, CKD 3B      SLP Plan  All goals met  Patient does not need any further Speech Lanaguage Pathology Services   Recommendations for follow up therapy are one component of a multi-disciplinary discharge planning process, led by the attending physician.  Recommendations may be updated based on patient status, additional functional criteria and insurance authorization.    Recommendations  Diet recommendations: Dysphagia 3 (mechanical soft);Thin liquid Liquids provided via: Cup;Straw Medication Administration: Whole meds with liquid Supervision: Patient able to self feed;Intermittent supervision to cue for  compensatory strategies Compensations: Slow rate;Small sips/bites Postural Changes and/or Swallow Maneuvers: Seated upright 90 degrees                  Oral care BID   Frequent or constant Supervision/Assistance Dysphagia, unspecified (R13.10)     All goals met     Amanda Stewart, M.A., CF-SLP Speech Language Pathology, Acute Rehabilitation Services  Secure Chat preferred [phone]   08/31/2023, 11:41 AM

## 2023-08-31 NOTE — Evaluation (Signed)
Occupational Therapy Evaluation Patient Details Name: Amanda Stewart MRN: [id number] DOB: 1941-07-24 Today's Date: 08/31/2023   History of Present Illness Patient is a 82 year old female who presents from home with multiple falls and generalized weakness. Family noted left facial droop and slurred speech. History of pacemaker. Recent hospital admission with orthostasis and falls at home.   Clinical Impression   Prior to this admission, patient with 9 falls, having a PCA come 2 hours a day, with daughters managing her medications. Currently, patient presenting with dizziness, (no nystagmus or BP drop noted) decreased strength on L side, and need for mod A for ADL management and transfers. Patient with significant flat affect, stating headache and dizziness with all movement. Patient able to come into standing x2 with mod A, with BLEs buckling on first attempt, and able to take shuffled steps to [facility] upon second attempt. Family is unable to provide 24/7 support, therefore OT recommendation for lesser intensity reahb of < 3 hours recommended. OT will continue to follow acutely.      If plan is discharge home, recommend the following: Two people to help with walking and/or transfers;A lot of help with bathing/dressing/bathroom;Assistance with cooking/housework;Direct supervision/assist for medications management;Direct supervision/assist for financial management;Assist for transportation;Help with stairs or ramp for entrance    Functional Status Assessment  Patient has had a recent decline in their functional status and demonstrates the ability to make significant improvements in function in a reasonable and predictable amount of time.  Equipment Recommendations  Other (comment) (defer to next venue)    Recommendations for Other Services       Precautions / Restrictions Precautions Precautions: Fall Restrictions Weight Bearing Restrictions: No      Mobility Bed Mobility Overal bed mobility:  Needs Assistance Bed Mobility: Supine to Sit, Sit to Supine     Supine to sit: Contact guard Sit to supine: Min assist   General bed mobility comments: increased time and effort required with  dizziness reported initially with sitting up. Requiring min A to guide BLEs back into bed    Transfers Overall transfer level: Needs assistance Equipment used: Rolling walker (2 wheels) Transfers: Sit to/from Stand Sit to Stand: Mod assist           General transfer comment: Patient able to stand with mod A with bed minimally elevated x2, with BLEs buckling on first attempt, and able to take shuffled steps to [facility] upon second attempt.      Balance Overall balance assessment: Needs assistance Sitting-balance support: Feet supported Sitting balance-Leahy Scale: Fair     Standing balance support: Reliant on assistive device for balance, Bilateral upper extremity supported Standing balance-Leahy Scale: Poor Standing balance comment: reliant on external assist                           ADL either performed or assessed with clinical judgement   ADL Overall ADL's : Needs assistance/impaired Eating/Feeding: Supervision/ safety;Set up;Sitting   Grooming: Sitting;Set up   Upper Body Bathing: Minimal assistance;Sitting   Lower Body Bathing: Moderate assistance;Maximal assistance;Sitting/lateral leans;Sit to/from stand   Upper Body Dressing : Minimal assistance;Sitting   Lower Body Dressing: Moderate assistance;Maximal assistance;Sitting/lateral leans;Sit to/from stand   Toilet Transfer: +2 for physical assistance;+2 for safety/equipment;Moderate assistance;Stand-pivot;Rolling walker (2 wheels) Toilet Transfer Details (indicate cue type and reason): simulated in room Toileting- Clothing Manipulation and Hygiene: Bed level;Total assistance       Functional mobility during ADLs: Moderate assistance;+2 for  physical assistance;+2 for safety/equipment;Cueing for sequencing;Cueing  for safety;Rolling walker (2 wheels) General ADL Comments: Patient presenting with dizziness, (no nystagmus or BP drop noted) decreased strength on L side, and need for mod A for ADL management and transfers. Patient with significant flat affect, stating headache and dizziness with all movement. Patient able to come into standing x2 with mod A, with BLEs buckling on first attempt, and able to take shuffled steps to [facility] upon second attempt. Family is unable to provide 24/7 support, therefore OT recommendation for lesser intensity reahb of < 3 hours recommended. OT will continue to follow acutely.     Vision Baseline Vision/History: 1 Wears glasses (Readers) Ability to See in Adequate Light: 0 Adequate Patient Visual Report: Blurring of vision Vision Assessment?: Yes Eye Alignment: Within Functional Limits Ocular Range of Motion: Within Functional Limits Alignment/Gaze Preference: Chin down Tracking/Visual Pursuits: Able to track stimulus in all quads without difficulty Saccades: Decreased speed of saccadic movement Convergence: Within functional limits Visual Fields: No apparent deficits Additional Comments: Reports occasional blurred vision, but able to read signs in room without increased effort or difficulty, head is tilted down with visual assessment despite OT repositioning, increased time for saccades noted     Perception Perception: Within Functional Limits       Praxis Praxis: Impaired Praxis Impairment Details: Motor planning, Initiation Praxis-Other Comments: need for increased time in order to complete   Pertinent Vitals/Pain Pain Assessment Pain Assessment: Faces Faces Pain Scale: Hurts little more Pain Location: headache Pain Descriptors / Indicators: Headache Pain Intervention(s): Limited activity within patient's tolerance, Monitored during session, Repositioned, Premedicated before session     Extremity/Trunk Assessment Upper Extremity Assessment Upper Extremity  Assessment: LUE deficits/detail;Generalized weakness LUE Deficits / Details: mild decreased grip strength compared to right, decreased ability to sequence fine motor movements, now states that sensation is the same as R side LUE Sensation: WNL LUE Coordination: decreased fine motor;decreased gross motor   Lower Extremity Assessment Lower Extremity Assessment: Defer to PT evaluation   Cervical / Trunk Assessment Cervical / Trunk Assessment: Kyphotic (minimally)   Communication Communication Communication: Hearing impairment;Difficulty following commands/understanding Following commands: Follows one step commands with increased time Cueing Techniques: Verbal cues;Gestural cues;Tactile cues;Visual cues   Cognition Arousal: Lethargic Behavior During Therapy: Flat affect Overall Cognitive Status: Difficult to assess                                 General Comments: Patient is oriented, but extremely flat, does not elaborate on questions posed. Will continue to assess     General Comments  BP 139/104 (114) in sitting EOB, RN made aware. Unable to get a BP in standing due to weakness    Exercises     Shoulder Instructions      Home Living Family/patient expects to be discharged to:: Private residence Living Arrangements: Alone Available Help at Discharge: Family Type of Home: Apartment Home Access: Elevator     Home Layout: One level     Bathroom Shower/Tub: Chief Strategy Officer: Standard     Home Equipment: Agricultural consultant (2 wheels);Grab bars - toilet;Lift chair;Other (comment)      Lives With: Alone    Prior Functioning/Environment Prior Level of Function : Independent/Modified Independent;History of Falls (last six months)             Mobility Comments: only intermittent use of DME (cane or rolling walker). she reports 9  falls since her last hospital stay ADLs Comments: independent per patient report        OT Problem List:  Decreased activity tolerance;Impaired balance (sitting and/or standing);Decreased coordination;Decreased safety awareness;Decreased knowledge of precautions;Decreased knowledge of use of DME or AE;Decreased strength;Decreased range of motion;Impaired vision/perception;Decreased cognition;Impaired UE functional use;Pain      OT Treatment/Interventions: Self-care/ADL training;Therapeutic exercise;Neuromuscular education;DME and/or AE instruction;Energy conservation;Manual therapy;Therapeutic activities;Visual/perceptual remediation/compensation;Patient/family education;Balance training    OT Goals(Current goals can be found in the care plan section) Acute Rehab OT Goals Patient Stated Goal: to get better OT Goal Formulation: With patient Time For Goal Achievement: 09/14/23 Potential to Achieve Goals: Fair  OT Frequency: Min 1X/week    Co-evaluation              AM-PAC OT "6 Clicks" Daily Activity     Outcome Measure Help from another person eating meals?: A Little Help from another person taking care of personal grooming?: A Little Help from another person toileting, which includes using toliet, bedpan, or urinal?: A Lot Help from another person bathing (including washing, rinsing, drying)?: A Lot Help from another person to put on and taking off regular upper body clothing?: A Little Help from another person to put on and taking off regular lower body clothing?: A Lot 6 Click Score: 15   End of Session Equipment Utilized During Treatment: Gait belt;Rolling walker (2 wheels) Nurse Communication: Mobility status  Activity Tolerance: Patient limited by fatigue Patient left: in bed;with call bell/phone within reach;with bed alarm set  OT Visit Diagnosis: Unsteadiness on feet (R26.81);Other abnormalities of gait and mobility (R26.89);History of falling (Z91.81);Muscle weakness (generalized) (M62.81);Pain;Repeated falls (R29.6)                Time: 1610-9604 OT Time Calculation (min):  21 min Charges:  OT General Charges $OT Visit: 1 Visit OT Evaluation $OT Eval Moderate Complexity: 1 Mod  Pollyann Glen E. Braddock Servellon, OTR/L Acute Rehabilitation Services [phone]   Cherlyn Cushing 08/31/2023, 12:31 PM

## 2023-09-01 DIAGNOSIS — I63231 Cerebral infarction due to unspecified occlusion or stenosis of right carotid arteries: Secondary | ICD-10-CM | POA: Diagnosis not present

## 2023-09-01 DIAGNOSIS — E1159 Type 2 diabetes mellitus with other circulatory complications: Secondary | ICD-10-CM

## 2023-09-01 DIAGNOSIS — Z950 Presence of cardiac pacemaker: Secondary | ICD-10-CM

## 2023-09-01 DIAGNOSIS — I152 Hypertension secondary to endocrine disorders: Secondary | ICD-10-CM

## 2023-09-01 DIAGNOSIS — E1169 Type 2 diabetes mellitus with other specified complication: Secondary | ICD-10-CM | POA: Diagnosis not present

## 2023-09-01 DIAGNOSIS — N1831 Chronic kidney disease, stage 3a: Secondary | ICD-10-CM

## 2023-09-01 DIAGNOSIS — E785 Hyperlipidemia, unspecified: Secondary | ICD-10-CM

## 2023-09-01 DIAGNOSIS — I739 Peripheral vascular disease, unspecified: Secondary | ICD-10-CM

## 2023-09-01 LAB — GLUCOSE, CAPILLARY
Glucose-Capillary: 150 mg/dL — ABNORMAL HIGH (ref 70–99)
Glucose-Capillary: 153 mg/dL — ABNORMAL HIGH (ref 70–99)
Glucose-Capillary: 186 mg/dL — ABNORMAL HIGH (ref 70–99)
Glucose-Capillary: 202 mg/dL — ABNORMAL HIGH (ref 70–99)
Glucose-Capillary: 242 mg/dL — ABNORMAL HIGH (ref 70–99)
Glucose-Capillary: 310 mg/dL — ABNORMAL HIGH (ref 70–99)

## 2023-09-01 NOTE — Plan of Care (Signed)
  Problem: Education: Goal: Ability to describe self-care measures that may prevent or decrease complications (Diabetes Survival Skills Education) will improve Outcome: Progressing Goal: Individualized Educational Video(s) Outcome: Progressing   Problem: Coping: Goal: Ability to adjust to condition or change in health will improve Outcome: Progressing   

## 2023-09-01 NOTE — Plan of Care (Signed)
  Problem: Education: Goal: Ability to describe self-care measures that may prevent or decrease complications (Diabetes Survival Skills Education) will improve Outcome: Progressing   Problem: Fluid Volume: Goal: Ability to maintain a balanced intake and output will improve Outcome: Progressing   Problem: Skin Integrity: Goal: Risk for impaired skin integrity will decrease Outcome: Progressing   Problem: Tissue Perfusion: Goal: Adequacy of tissue perfusion will improve Outcome: Progressing   Problem: Education: Goal: Knowledge of General Education information will improve Description: Including pain rating scale, medication(s)/side effects and non-pharmacologic comfort measures Outcome: Progressing   Problem: Clinical Measurements: Goal: Respiratory complications will improve Outcome: Progressing   Problem: Activity: Goal: Risk for activity intolerance will decrease Outcome: Progressing

## 2023-09-01 NOTE — Progress Notes (Signed)
Patient ID: Amanda Stewart, female   DOB: [birth year], 82 y.o.   MRN: [id number]  Resting comfortably on my rounds.  No interval issues.  Dr. Hetty Blend is planning to do a right transcarotid artery revascularization with possible lithotripsy on Thursday, 09/06/2023.  She should continue aspirin, Plavix, and statin therapies.  Rande Brunt. Lenell Antu, MD [facility] Phone Number: [phone] 09/01/2023 11:24 AM

## 2023-09-01 NOTE — Progress Notes (Signed)
PROGRESS NOTE    Amanda Stewart  GMW:[id number] DOB: Dec 23, 1940 DOA: 08/29/2023 PCP: Fleet Contras, MD    Brief Narrative:    Amanda Stewart is a 82 y.o. female with medical history significant for coronary artery disease, chronic diastolic CHF, type 2 diabetes, diabetic polyneuropathy, hyperlipidemia, hypertension, LBBB, status post pacemaker placement, peripheral artery disease on Aspirin and Pletal, CKD 3B, who presented from home after multiple falls and generalized weakness.  Patient daughter noted left facial droop and slurred speech 1 day prior to presentation.  Patient was recently admitted from 08/11/2023 through 08/13/2023 due to generalized weakness, dizziness, found to have orthostatic hypotension, was discharged to home with home health PT OT.  In the ED on this presentation, patient was seen by neurology and stroke team.  CT head and cervical spine was negative.  Vitals were stable.  Labs remarkable for elevated glucose at 223 creatinine of 1.2.  Patient was then considered for admission to hospital for further evaluation and treatment.  Assessment and plan.  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 Had acute left facial and left-sided weakness.  CT head negative.  Admitted for stroke workup.  CT angiogram of the head and neck without any intracranial occlusion.  MRI of the brain showed a mixed watershed and embolic infarct in the right MCA territory.  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  2D echo, most recent 08/12/23 showed LV ejection fraction of 65 to 70% with LVH.  Lipid panel within normal range.neurology on board.  Pacemaker interrogation was done.  Continue aspirin and Plavix.  PT has  recommended CIR placement at this time.  Vascular surgery has recommendations for TCAR on Tuesday, 09/04/2023.  History of pacemaker placement.  Interrogation done.   Hypokalemia.  Potassium today at 3.2.  Replenished.  Check levels in AM.  Type 2 diabetes with hyperglycemia Hemoglobin A1c, most recent 10.1 on 08/12/23.  Continue diabetic diet, insulin sliding scale.  Latest POC glucose of 242.   CKD 3B At baseline.  Latest creatinine at 1.0.  Check BMP in AM.   HFpEF Continue intake and output charting Daily weights.  Recent 2D echocardiogram with preserved LV function.  Compensated   Generalized weakness Frequent falls Continue fall precautions.  PT has recommended CIR placement.     DVT prophylaxis: enoxaparin (LOVENOX) injection 40 mg Start: 08/29/23 2315   Code Status:     Code Status: Full Code  Disposition: Likely to CIR after surgery..  Status is: Inpatient  Remains inpatient appropriate because: Stroke, vascular surgery  for TCAR    Family Communication:  Spoke with the daughter on the phone on 08/30/2023.   Consultants:  Neurology Vascular surgery  Procedures:  Pacemaker interrogation  Antimicrobials:  None  Anti-infectives (From admission, onward)    None       Subjective: Today, patient was seen and examined at bedside.  Patient denies any nausea, vomiting, headache, fever, chills or rigor.   Objective: Vitals:   08/31/23 2006 08/31/23 2336 09/01/23 0404 09/01/23 0726  BP: 127/71 (!) 121/98 (!) 142/73 (!) 152/71  Pulse: 91 86 94 88  Resp: 18 16 16 16   Temp: 98.4 F (36.9 C) 98.3 F (36.8 C) 98.3 F (36.8 C) 98.4 F (36.9 C)  TempSrc: Oral Oral Oral Oral  SpO2: 99% 98% 97% 98%  Weight:      Height:        Intake/Output Summary (Last 24 hours) at 09/01/2023 0922 Last data filed at 09/01/2023 0500 Gross per 24 hour  Intake 320 ml  Output 800 ml  Net -480 ml   Filed Weights   08/29/23 1621  Weight: 78.4 kg    Physical  Examination: Body mass index is 28.76 kg/m.   General:  Average built, not in obvious distress, hard of hearing, elderly female, Communicative HENT:   No scleral pallor or icterus noted. Oral mucosa is moist.  Chest:  .  Diminished breath sounds bilaterally. No crackles or wheezes.  CVS: S1 &S2 heard. No murmur.  Regular rate and rhythm. Abdomen: Soft, nontender, nondistended.  Bowel sounds are heard.   Extremities: No cyanosis, clubbing or edema.   Psych: Alert, awake and Communicative, CNS: Communicative, follows commands, mild left facial droop with decreased strength left Skin: Warm and dry.  No rashes noted.  Moving all extremities.  Data Reviewed:   CBC: Recent Labs  Lab 08/29/23 1630 08/29/23 1637 08/31/23 0657  WBC 8.0  --  6.2  NEUTROABS 6.0  --   --   HGB 12.7 12.9 12.6  HCT 38.6 38.0 37.5  MCV 97.7  --  94.0  PLT 196  --  188    Basic Metabolic Panel: Recent Labs  Lab 08/29/23 1630 08/29/23 1637 08/31/23 0657  NA 141 141 141  K 3.6 3.6 3.2*  CL 107 109 102  CO2 20*  --  27  GLUCOSE 282* 283* 128*  BUN 17 17 10   CREATININE 1.18* 1.20* 1.01*  CALCIUM 9.5  --  8.9  MG  --   --  2.0    Liver Function Tests: Recent Labs  Lab 08/29/23 1630  AST 29  ALT 30  ALKPHOS 118  BILITOT 0.7  PROT 6.9  ALBUMIN 3.9     Radiology Studies: MR BRAIN WO CONTRAST  Result Date: 08/30/2023 CLINICAL DATA:  Neuro deficit, acute, stroke suspected EXAM: MRI HEAD WITHOUT CONTRAST TECHNIQUE: Multiplanar, multiecho pulse sequences of the brain and surrounding structures were obtained without intravenous contrast. COMPARISON:  Same day CTA head/neck angiogram.  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 No hemorrhage. No hydrocephalus. No extra-axial fluid collection. Sequela of moderate chronic microvascular ischemic change. 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 This was present on prior head CT from 05/30/2014. Vascular: Normal flow voids. See same day CTA head and neck angiogram. Skull and upper cervical spine: Normal marrow signal. Sinuses/Orbits: No middle ear or mastoid effusion. Paranasal sinuses are grossly clear. Bilateral lens replacement. Orbits are otherwise unremarkable. Other: None. 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 No hemorrhage. Electronically Signed   By: Lorenza Cambridge M.D.   On: 08/30/2023 13:06      LOS: 3 days    Joycelyn Das, MD [facility] Available via Epic secure chat 7am-7pm After these hours, please refer to coverage provider listed on [email or web address] 09/01/2023, 9:22 AM

## 2023-09-02 DIAGNOSIS — E1169 Type 2 diabetes mellitus with other specified complication: Secondary | ICD-10-CM | POA: Diagnosis not present

## 2023-09-02 DIAGNOSIS — E1159 Type 2 diabetes mellitus with other circulatory complications: Secondary | ICD-10-CM | POA: Diagnosis not present

## 2023-09-02 DIAGNOSIS — I63231 Cerebral infarction due to unspecified occlusion or stenosis of right carotid arteries: Secondary | ICD-10-CM | POA: Diagnosis not present

## 2023-09-02 DIAGNOSIS — N1831 Chronic kidney disease, stage 3a: Secondary | ICD-10-CM | POA: Diagnosis not present

## 2023-09-02 LAB — BASIC METABOLIC PANEL
Anion gap: 13 (ref 5–15)
BUN: 12 mg/dL (ref 8–23)
CO2: 22 mmol/L (ref 22–32)
Calcium: 8.7 mg/dL — ABNORMAL LOW (ref 8.9–10.3)
Chloride: 102 mmol/L (ref 98–111)
Creatinine, Ser: 0.93 mg/dL (ref 0.44–1.00)
GFR, Estimated: >60 mL/min (ref >60)
Glucose, Bld: 254 mg/dL — ABNORMAL HIGH (ref 70–99)
Potassium: 4.8 mmol/L (ref 3.5–5.1)
Sodium: 137 mmol/L (ref 135–145)

## 2023-09-02 LAB — GLUCOSE, CAPILLARY
Glucose-Capillary: 145 mg/dL — ABNORMAL HIGH (ref 70–99)
Glucose-Capillary: 181 mg/dL — ABNORMAL HIGH (ref 70–99)
Glucose-Capillary: 182 mg/dL — ABNORMAL HIGH (ref 70–99)
Glucose-Capillary: 233 mg/dL — ABNORMAL HIGH (ref 70–99)
Glucose-Capillary: 195 mg/dL — ABNORMAL HIGH (ref 70–99)

## 2023-09-02 LAB — CBC
HCT: 36.6 % (ref 36.0–46.0)
Hemoglobin: 12.6 g/dL (ref 12.0–15.0)
MCH: 32.9 pg (ref 26.0–34.0)
MCHC: 34.4 g/dL (ref 30.0–36.0)
MCV: 95.6 fL (ref 80.0–100.0)
Platelets: 178 10*3/uL (ref 150–400)
RBC: 3.83 MIL/uL — ABNORMAL LOW (ref 3.87–5.11)
RDW: 14.3 % (ref 11.5–15.5)
WBC: 5.8 10*3/uL (ref 4.0–10.5)
nRBC: 0 % (ref 0.0–0.2)

## 2023-09-02 LAB — MAGNESIUM: Magnesium: 1.9 mg/dL (ref 1.7–2.4)

## 2023-09-02 MED ORDER — ONDANSETRON HCL 4 MG/2ML IJ SOLN: 4.0000 mg | Freq: Four times a day (QID) | INTRAMUSCULAR | Status: DC | PRN

## 2023-09-02 NOTE — Progress Notes (Signed)
Patient ID: Amanda Stewart, female   DOB: 10/07/1941, 82 y.o.   MRN: [id number]  Awake and alert on morning rounds.  Patient reports fairly similar dysarthria and facial droop.  No new extremity weakness.  Dr. Hetty Blend is planning to do a right transcarotid artery revascularization with possible lithotripsy on Thursday, 09/06/2023.  She should continue aspirin, Plavix, and statin therapies.  Rande Brunt. Lenell Antu, MD [facility] Phone Number: [phone] 09/02/2023 11:58 AM

## 2023-09-02 NOTE — Plan of Care (Signed)
  Problem: Fluid Volume: Goal: Ability to maintain a balanced intake and output will improve Outcome: Progressing   Problem: Skin Integrity: Goal: Risk for impaired skin integrity will decrease Outcome: Progressing   Problem: Clinical Measurements: Goal: Respiratory complications will improve Outcome: Progressing Goal: Cardiovascular complication will be avoided Outcome: Progressing   Problem: Activity: Goal: Risk for activity intolerance will decrease Outcome: Progressing   Problem: Coping: Goal: Level of anxiety will decrease Outcome: Progressing   Problem: Elimination: Goal: Will not experience complications related to bowel motility Outcome: Progressing Goal: Will not experience complications related to urinary retention Outcome: Progressing

## 2023-09-02 NOTE — Plan of Care (Signed)

## 2023-09-02 NOTE — Progress Notes (Signed)
PROGRESS NOTE    Amanda Stewart  ZOX:[id number] DOB: 1941/08/18 DOA: 08/29/2023 PCP: Fleet Contras, MD    Brief Narrative:    Amanda Stewart is a 82 y.o. female with medical history significant for coronary artery disease, chronic diastolic CHF, type 2 diabetes, diabetic polyneuropathy, hyperlipidemia, hypertension, LBBB, status post pacemaker placement, peripheral artery disease on Aspirin and Pletal, CKD 3B, who presented from home after multiple falls and generalized weakness.  Patient daughter noted left facial droop and slurred speech 1 day prior to presentation.  Patient was recently admitted from 08/11/2023 through 08/13/2023 due to generalized weakness, dizziness, found to have orthostatic hypotension, was discharged to home with home health PT OT.  In the ED on this presentation, patient was seen by neurology and stroke team.  CT head and cervical spine was negative.  Vitals were stable.  Labs remarkable for elevated glucose at 223 creatinine of 1.2.  Patient was then considered for admission to hospital for further evaluation and treatment.  Assessment and plan.  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 Had acute left facial and left-sided weakness.  CT head negative.  Admitted for stroke workup.  CT angiogram of the head and neck without any intracranial occlusion.  MRI of the brain showed a mixed watershed and embolic infarct in the right MCA territory.  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  2D echo, most recent 08/12/23 showed LV ejection fraction of 65 to 70% with LVH.  Lipid panel within normal range. Neurology on board.  Pacemaker interrogation was done.  Continue aspirin and Plavix.  PT has  recommended CIR placement at this time.  Vascular surgery has recommendations for TCAR on Thursday, 09/06/2023.  History of pacemaker placement.  Interrogation done.   Hypokalemia.    Replenished.  BMP pending.  Type 2 diabetes with hyperglycemia Hemoglobin A1c, most recent 10.1 on 08/12/23.  Continue diabetic diet, insulin sliding scale.  Latest POC glucose of 181.   CKD 3B At baseline.  Latest creatinine at 1.0.  Check BMP in AM.   HFpEF Continue intake and output charting Daily weights.  Recent 2D echocardiogram with preserved LV function.  Compensated   Generalized weakness Frequent falls Continue fall precautions.  PT has recommended CIR placement.     DVT prophylaxis: enoxaparin (LOVENOX) injection 40 mg Start: 08/29/23 2315   Code Status:     Code Status: Full Code  Disposition: Likely to CIR after surgery.  Status is: Inpatient  Remains inpatient appropriate because: Stroke, vascular surgery  for TCAR    Family Communication:  Spoke with the daughter on the phone on 08/30/2023.   Consultants:  Neurology Vascular surgery  Procedures:  Pacemaker interrogation  Antimicrobials:  None  Anti-infectives (From admission, onward)    None       Subjective: Today, patient was seen and examined at bedside.  Patient states that she does have some headache today with 1 episode of vomiting.  Denies any focal weakness, shortness of breath cough fever chills or rigor. Objective: Vitals:   09/01/23 1946 09/01/23 2347 09/02/23 0342 09/02/23 0729  BP: (!) 148/75 (!) 143/67 (!) 164/77 (!) 158/79  Pulse: 81 81 84 85  Resp: 17 18 18 18   Temp: 98.3 F (36.8 C) 98.2  F (36.8 C) 98 F (36.7 C) 98.1 F (36.7 C)  TempSrc: Oral   Oral  SpO2: 98% 98% 98% 100%  Weight:      Height:        Intake/Output Summary (Last 24 hours) at 09/02/2023 0952 Last data filed at 09/01/2023 1830 Gross per 24 hour  Intake --  Output 450 ml  Net -450 ml   Filed Weights   08/29/23 1621   Weight: 78.4 kg    Physical Examination: Body mass index is 28.76 kg/m.   General:  Average built, not in obvious distress, hard of hearing, elderly female, Communicative HENT:   No scleral pallor or icterus noted. Oral mucosa is moist.  Chest:  .  Diminished breath sounds bilaterally. No crackles or wheezes.  CVS: S1 &S2 heard. No murmur.  Regular rate and rhythm. Abdomen: Soft, nontender, nondistended.  Bowel sounds are heard.   Extremities: No cyanosis, clubbing or edema.   Psych: Alert, awake and Communicative, CNS: Oriented to place and person, mild left facial droop with decreased strength left, flat affect, Skin: Warm and dry.  No rashes noted.  Moving all extremities.  Data Reviewed:   CBC: Recent Labs  Lab 08/29/23 1630 08/29/23 1637 08/31/23 0657  WBC 8.0  --  6.2  NEUTROABS 6.0  --   --   HGB 12.7 12.9 12.6  HCT 38.6 38.0 37.5  MCV 97.7  --  94.0  PLT 196  --  188    Basic Metabolic Panel: Recent Labs  Lab 08/29/23 1630 08/29/23 1637 08/31/23 0657  NA 141 141 141  K 3.6 3.6 3.2*  CL 107 109 102  CO2 20*  --  27  GLUCOSE 282* 283* 128*  BUN 17 17 10   CREATININE 1.18* 1.20* 1.01*  CALCIUM 9.5  --  8.9  MG  --   --  2.0    Liver Function Tests: Recent Labs  Lab 08/29/23 1630  AST 29  ALT 30  ALKPHOS 118  BILITOT 0.7  PROT 6.9  ALBUMIN 3.9     Radiology Studies: No results found.    LOS: 4 days    Joycelyn Das, MD [facility] Available via Epic secure chat 7am-7pm After these hours, please refer to coverage provider listed on [email or web address] 09/02/2023, 9:52 AM

## 2023-09-03 DIAGNOSIS — E1159 Type 2 diabetes mellitus with other circulatory complications: Secondary | ICD-10-CM | POA: Diagnosis not present

## 2023-09-03 DIAGNOSIS — N1831 Chronic kidney disease, stage 3a: Secondary | ICD-10-CM | POA: Diagnosis not present

## 2023-09-03 DIAGNOSIS — E1169 Type 2 diabetes mellitus with other specified complication: Secondary | ICD-10-CM | POA: Diagnosis not present

## 2023-09-03 DIAGNOSIS — I63231 Cerebral infarction due to unspecified occlusion or stenosis of right carotid arteries: Secondary | ICD-10-CM | POA: Diagnosis not present

## 2023-09-03 LAB — CBC
HCT: 37.8 % (ref 36.0–46.0)
Hemoglobin: 12.5 g/dL (ref 12.0–15.0)
MCH: 31.4 pg (ref 26.0–34.0)
MCHC: 33.1 g/dL (ref 30.0–36.0)
MCV: 95 fL (ref 80.0–100.0)
Platelets: 179 10*3/uL (ref 150–400)
RBC: 3.98 MIL/uL (ref 3.87–5.11)
RDW: 14.4 % (ref 11.5–15.5)
WBC: 7.1 10*3/uL (ref 4.0–10.5)
nRBC: 0 % (ref 0.0–0.2)

## 2023-09-03 LAB — GLUCOSE, CAPILLARY
Glucose-Capillary: 145 mg/dL — ABNORMAL HIGH (ref 70–99)
Glucose-Capillary: 148 mg/dL — ABNORMAL HIGH (ref 70–99)
Glucose-Capillary: 152 mg/dL — ABNORMAL HIGH (ref 70–99)
Glucose-Capillary: 158 mg/dL — ABNORMAL HIGH (ref 70–99)
Glucose-Capillary: 167 mg/dL — ABNORMAL HIGH (ref 70–99)
Glucose-Capillary: 174 mg/dL — ABNORMAL HIGH (ref 70–99)
Glucose-Capillary: 201 mg/dL — ABNORMAL HIGH (ref 70–99)

## 2023-09-03 LAB — BASIC METABOLIC PANEL
Anion gap: 9 (ref 5–15)
BUN: 11 mg/dL (ref 8–23)
CO2: 27 mmol/L (ref 22–32)
Calcium: 8.8 mg/dL — ABNORMAL LOW (ref 8.9–10.3)
Chloride: 102 mmol/L (ref 98–111)
Creatinine, Ser: 0.93 mg/dL (ref 0.44–1.00)
GFR, Estimated: >60 mL/min (ref >60)
Glucose, Bld: 188 mg/dL — ABNORMAL HIGH (ref 70–99)
Potassium: 3.5 mmol/L (ref 3.5–5.1)
Sodium: 138 mmol/L (ref 135–145)

## 2023-09-03 LAB — TYPE AND SCREEN
ABO/RH(D): O POS
Antibody Screen: NEGATIVE

## 2023-09-03 LAB — ABO/RH: ABO/RH(D): O POS

## 2023-09-03 MED ORDER — METOPROLOL TARTRATE 25 MG PO TABS
25.0000 mg | ORAL_TABLET | Freq: Two times a day (BID) | ORAL | Status: DC
  Administered 2023-09-03 – 2023-09-07 (×8): 25 mg via ORAL
  Filled 2023-09-03 (×8): qty 1

## 2023-09-03 MED ORDER — CEFAZOLIN SODIUM-DEXTROSE 1-4 GM/50ML-% IV SOLN
1.0000 g | INTRAVENOUS | Status: AC
  Administered 2023-09-04: 1 g via INTRAVENOUS
  Filled 2023-09-03: qty 50

## 2023-09-03 NOTE — TOC Progression Note (Signed)
Transition of Care [facility]) - Progression Note    Patient Details  Name: Amanda Stewart MRN: [id number] Date of Birth: 12/09/1940  [facility] CM/SW Contact  Baldemar Lenis, [place] Phone Number: 09/03/2023, 10:58 AM  Clinical Narrative:   CSW spoke with daughter, Sue Lush, to provide bed offers. CSW sent a text message with SNF options for family to review and make choice. Family to review and update CSW with decision. CSW to follow.  UPDATE: CSW received update from Sue Lush that they were interested in [facility]. CSW asked [facility] to review, they have declined. CSW relayed message onto family, they will review other options.    Expected Discharge Plan: Skilled Nursing Facility Barriers to Discharge: Continued Medical Work up, English as a second language teacher  Expected Discharge Plan and Services     Post Acute Care Choice: Skilled Nursing Facility Living arrangements for the past 2 months: Apartment                                       Social Determinants of Health (SDOH) Interventions SDOH Screenings   Food Insecurity: No Food Insecurity (08/12/2023)  Housing: Low Risk  (08/12/2023)  Transportation Needs: No Transportation Needs (08/12/2023)  Utilities: Not At Risk (08/12/2023)  Tobacco Use: Low Risk  (08/29/2023)    Readmission Risk Interventions     No data to display

## 2023-09-03 NOTE — Progress Notes (Signed)
CCC Pre-op Review  Pre-op checklist: Not completed at this time by bedside RN. Surgery is for next day  NPO: Order is placed to start at midnight on 10/8  Labs: PCR and T&S order placed  Consent: No orders. Msg sent to surgeon for orders on 10/07  H&P: Hospitalist 10/02  Vitals: stable  O2 requirements: RA  MAR/PTA review: MD to start home dose of metoprolol. Con't Plavix, statin and ASA per PA note on 10/07  IV: 22g-will get bigger iv in pre-op  Floor nurse name:  Angelene Giovanni, RN on 10/07   Additional info:  Has [employer] Pacemaker. Rep contacted will be in pre-op tomorrow around 1130.  [facility] Tele

## 2023-09-03 NOTE — Care Management Important Message (Signed)
Important Message  Patient Details  Name: Amanda Stewart MRN: [id number] Date of Birth: 22-May-1941   Important Message Given:  Yes - Medicare IM     Dorena Bodo 09/03/2023, 2:21 PM

## 2023-09-03 NOTE — Progress Notes (Signed)
Physical Therapy Treatment  Patient Details Name: Amanda Stewart MRN: [id number] DOB: 04-09-1941 Today's Date: 09/03/2023   History of Present Illness Patient is a 82 year old female who presents from home with multiple falls and generalized weakness. Family noted left facial droop and slurred speech. History of pacemaker. Recent hospital admission with orthostasis and falls at home.    PT Comments  Pt progressing towards physical therapy goals. Pt motivated for OOB to chair but tolerance for functional activity is decreased at this time. Pt reports dizziness upon standing, however did not note any nystagmus. Pt reports she has a history of orthostatic hypotension. BP sitting 143/83 (99), BP standing 137/64 (85) this session. Will continue to follow and progress as able per POC.    If plan is discharge home, recommend the following: Two people to help with walking and/or transfers;A lot of help with bathing/dressing/bathroom;Supervision due to cognitive status;Help with stairs or ramp for entrance;Assistance with cooking/housework   Can travel by private vehicle     Yes  Equipment Recommendations  Other (comment) (TBD by next venue of care)    Recommendations for Other Services       Precautions / Restrictions Precautions Precautions: Fall Precaution Comments: 8 falls in the last several weeks Restrictions Weight Bearing Restrictions: No     Mobility  Bed Mobility Overal bed mobility: Needs Assistance Bed Mobility: Supine to Sit     Supine to sit: Supervision     General bed mobility comments: HOB elevated. Pt was able to transition to EOB without assistance. Pt does not report any dizziness with sitting up EOB.    Transfers Overall transfer level: Needs assistance Equipment used: Rolling walker (2 wheels) Transfers: Sit to/from Stand, Bed to chair/wheelchair/BSC Sit to Stand: Min assist, +2 physical assistance   Step pivot transfers: Min assist, +2 physical assistance        General transfer comment: +2 min assist for power up to full stand, and to gain/maintain standing balance. Pt stood for BP reading and required a seated rest break before transfer to chair. +2 min assist for balance and walker management during step-pivot to chair.    Ambulation/Gait               General Gait Details: Did not progress to ambulation at this time.   Stairs             Wheelchair Mobility     Tilt Bed    Modified Rankin (Stroke Patients Only)       Balance Overall balance assessment: Needs assistance Sitting-balance support: Feet supported Sitting balance-Leahy Scale: Fair     Standing balance support: Reliant on assistive device for balance, Bilateral upper extremity supported Standing balance-Leahy Scale: Poor Standing balance comment: reliant on external assist                            Cognition Arousal: Alert Behavior During Therapy: Flat affect Overall Cognitive Status: Impaired/Different from baseline Area of Impairment: Attention, Following commands, Safety/judgement, Awareness, Problem solving                   Current Attention Level: Sustained   Following Commands: Follows one step commands consistently, Follows one step commands with increased time Safety/Judgement: Decreased awareness of safety, Decreased awareness of deficits Awareness: Emergent Problem Solving: Slow processing, Decreased initiation, Difficulty sequencing, Requires verbal cues          Exercises  General Comments        Pertinent Vitals/Pain Pain Assessment Pain Assessment: No/denies pain    Home Living                          Prior Function            PT Goals (current goals can now be found in the care plan section) Acute Rehab PT Goals Patient Stated Goal: to avoid falls PT Goal Formulation: With patient Time For Goal Achievement: 09/06/23 Potential to Achieve Goals: Fair Progress towards PT  goals: Progressing toward goals    Frequency    Min 1X/week      PT Plan      Co-evaluation              AM-PAC PT "6 Clicks" Mobility   Outcome Measure  Help needed turning from your back to your side while in a flat bed without using bedrails?: A Little Help needed moving from lying on your back to sitting on the side of a flat bed without using bedrails?: A Little Help needed moving to and from a bed to a chair (including a wheelchair)?: A Lot Help needed standing up from a chair using your arms (e.g., wheelchair or bedside chair)?: A Lot Help needed to walk in hospital room?: Total Help needed climbing 3-5 steps with a railing? : Total 6 Click Score: 12    End of Session Equipment Utilized During Treatment: Gait belt Activity Tolerance: Patient limited by fatigue Patient left: in bed;with call bell/phone within reach Nurse Communication: Mobility status PT Visit Diagnosis: Muscle weakness (generalized) (M62.81);Difficulty in walking, not elsewhere classified (R26.2);Repeated falls (R29.6)     Time: 9562-1308 PT Time Calculation (min) (ACUTE ONLY): 18 min  Charges:    $Gait Training: 8-22 mins PT General Charges $$ ACUTE PT VISIT: 1 Visit                     Conni Slipper, PT, DPT Acute Rehabilitation Services Secure Chat Preferred Office: [phone]    Amanda Stewart 09/03/2023, 2:03 PM

## 2023-09-03 NOTE — Progress Notes (Signed)
  Progress Note    09/03/2023 7:21 AM Hospital Day 5  Subjective:  pt states she has a headache but this is not new.  Daughter at bedside.   Afebrile  Vitals:   09/03/23 0028 09/03/23 0442  BP: 124/70 (!) 143/71  Pulse: 60 82  Resp: 17 18  Temp: (!) 97.5 F (36.4 C) 98 F (36.7 C)  SpO2: 99% 100%    Physical Exam: General:  no distress Lungs:  non labored   CBC    Component Value Date/Time   WBC 5.8 09/02/2023 0954   RBC 3.83 (L) 09/02/2023 0954   HGB 12.6 09/02/2023 0954   HCT 36.6 09/02/2023 0954   PLT 178 09/02/2023 0954   MCV 95.6 09/02/2023 0954   MCH 32.9 09/02/2023 0954   MCHC 34.4 09/02/2023 0954   RDW 14.3 09/02/2023 0954   LYMPHSABS 1.4 08/29/2023 1630   MONOABS 0.5 08/29/2023 1630   EOSABS 0.0 08/29/2023 1630   BASOSABS 0.1 08/29/2023 1630    BMET    Component Value Date/Time   NA 137 09/02/2023 0954   K 4.8 09/02/2023 0954   CL 102 09/02/2023 0954   CO2 22 09/02/2023 0954   GLUCOSE 254 (H) 09/02/2023 0954   BUN 12 09/02/2023 0954   CREATININE 0.93 09/02/2023 0954   CREATININE 1.25 (H) 12/22/2022 0000   CALCIUM 8.7 (L) 09/02/2023 0954   GFRNONAA >60 09/02/2023 0954   GFRNONAA 59 (L) 01/05/2021 0948   GFRAA 69 01/05/2021 0948    INR    Component Value Date/Time   INR 0.9 08/29/2023 1630     Intake/Output Summary (Last 24 hours) at 09/03/2023 0721 Last data filed at 09/03/2023 0418 Gross per 24 hour  Intake --  Output 950 ml  Net -950 ml     Assessment/Plan:  82 y.o. female with symptomatic right ICA stenosis  Hospital Day 5  -pt doing well this am. -plan for right TCAR with possible lithotripsy 09/06/2023 -please continue asa/Plavix/statin   Doreatha Massed, PA-C [facility] [phone] 09/03/2023 7:21 AM

## 2023-09-03 NOTE — Progress Notes (Signed)
PROGRESS NOTE    Amanda Stewart  GEX:[id number] DOB: 12/28/1940 DOA: 08/29/2023 PCP: Fleet Contras, MD    Brief Narrative:    Amanda Stewart is a 82 y.o. female with medical history significant for coronary artery disease, chronic diastolic CHF, type 2 diabetes, diabetic polyneuropathy, hyperlipidemia, hypertension, LBBB, status post pacemaker placement, peripheral artery disease on Aspirin and Pletal, CKD 3B, who presented from home after multiple falls and generalized weakness.  Patient daughter noted left facial droop and slurred speech 1 day prior to presentation.  Patient was recently admitted from 08/11/2023 through 08/13/2023 due to generalized weakness, dizziness, found to have orthostatic hypotension, was discharged to home with home health PT OT.  In the ED on this presentation, patient was seen by neurology and stroke team.  CT head and cervical spine was negative.  Vitals were stable.  Labs remarkable for elevated glucose at 223 creatinine of 1.2.  Patient was then considered for admission to hospital for further evaluation and treatment.  Assessment and plan.  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 Had acute left facial and left-sided weakness.  CT head negative.    CT angiogram of the head and neck without any intracranial occlusion.  MRI of the brain showed a mixed watershed and embolic infarct in the right MCA territory.  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  2D echo on 08/12/23 showed LV ejection fraction of 65 to 70% with LVH.  Lipid panel within normal range. Neurology on board.  Pacemaker interrogation was done.  Continue aspirin and Plavix.  PT has recommended CIR placement at this time.   Vascular surgery has recommendations for TCAR on Thursday, 09/06/2023.  History of pacemaker placement.     Hypokalemia.    Replenished.  Latest potassium of 3.5.  Type 2 diabetes with hyperglycemia Hemoglobin A1c, most recent 10.1 on 08/12/23.  Continue diabetic diet, insulin sliding scale.  Latest POC glucose of 174   CKD 3B At baseline.  Latest creatinine at 0.9.Marland Kitchen  Check BMP in AM.   HFpEF Continue intake and output charting Daily weights.  Recent 2D echocardiogram with preserved LV function.  Compensated   Generalized weakness Frequent falls Continue fall precautions.  PT has recommended CIR placement.     DVT prophylaxis: enoxaparin (LOVENOX) injection 40 mg Start: 08/29/23 2315   Code Status:     Code Status: Full Code  Disposition: Likely to CIR after surgery.  Status is: Inpatient  Remains inpatient appropriate because: Stroke, vascular surgery  for TCAR    Family Communication:  Spoke with the daughter on the phone on 08/30/2023.   Consultants:  Neurology Vascular surgery  Procedures:  Pacemaker interrogation  Antimicrobials:  None  Anti-infectives (From admission, onward)    None       Subjective: Today, patient was seen and examined at bedside.  Denies any nausea vomiting fever chills or rigor.  Has been having headache and some blurry vision.  Objective: Vitals:   09/02/23 1957 09/03/23 0028 09/03/23 0442 09/03/23 0815  BP: 138/74 124/70 (!) 143/71 (!) 154/82  Pulse: 88 60 82 86  Resp: 17 17 18 16   Temp: 98.4 F (36.9 C) (!) 97.5 F (36.4 C) 98 F (36.7 C) 98.4 F (36.9 C)  TempSrc:  Oral Oral Oral Oral  SpO2: 99% 99% 100% 100%  Weight:      Height:        Intake/Output Summary (Last 24 hours) at 09/03/2023 1108 Last data filed at 09/03/2023 0418 Gross per 24 hour  Intake --  Output 950 ml  Net -950 ml   Filed Weights   08/29/23 1621  Weight: 78.4 kg    Physical Examination: Body mass index is 28.76 kg/m.   General:   Average built, not in obvious distress, hard of hearing, elderly female, Communicative HENT:   No scleral pallor or icterus noted. Oral mucosa is moist.  Chest:  .  Diminished breath sounds bilaterally. No crackles or wheezes.  CVS: S1 &S2 heard. No murmur.  Regular rate and rhythm. Abdomen: Soft, nontender, nondistended.  Bowel sounds are heard.   Extremities: No cyanosis, clubbing or edema.   Psych: Alert, awake and Communicative, CNS: Oriented to place and person, mild left-sided decreased strength.  Flat affect, Skin: Warm and dry.  No rashes noted.  Moving all extremities.  Data Reviewed:   CBC: Recent Labs  Lab 08/29/23 1630 08/29/23 1637 08/31/23 0657 09/02/23 0954 09/03/23 0834  WBC 8.0  --  6.2 5.8 7.1  NEUTROABS 6.0  --   --   --   --   HGB 12.7 12.9 12.6 12.6 12.5  HCT 38.6 38.0 37.5 36.6 37.8  MCV 97.7  --  94.0 95.6 95.0  PLT 196  --  188 178 179    Basic Metabolic Panel: Recent Labs  Lab 08/29/23 1630 08/29/23 1637 08/31/23 0657 09/02/23 0954 09/03/23 0834  NA 141 141 141 137 138  K 3.6 3.6 3.2* 4.8 3.5  CL 107 109 102 102 102  CO2 20*  --  27 22 27   GLUCOSE 282* 283* 128* 254* 188*  BUN 17 17 10 12 11   CREATININE 1.18* 1.20* 1.01* 0.93 0.93  CALCIUM 9.5  --  8.9 8.7* 8.8*  MG  --   --  2.0 1.9  --     Liver Function Tests: Recent Labs  Lab 08/29/23 1630  AST 29  ALT 30  ALKPHOS 118  BILITOT 0.7  PROT 6.9  ALBUMIN 3.9     Radiology Studies: No results found.    LOS: 5 days    Joycelyn Das, MD [facility] Available via Epic secure chat 7am-7pm After these hours, please refer to coverage provider listed on [email or web address] 09/03/2023, 11:08 AM

## 2023-09-04 ENCOUNTER — Other Ambulatory Visit: Payer: Self-pay

## 2023-09-04 ENCOUNTER — Inpatient Hospital Stay (HOSPITAL_COMMUNITY): Payer: 59 | Admitting: Anesthesiology

## 2023-09-04 ENCOUNTER — Encounter (HOSPITAL_COMMUNITY): Payer: Self-pay | Admitting: Internal Medicine

## 2023-09-04 ENCOUNTER — Encounter (HOSPITAL_COMMUNITY)
Admission: EM | Disposition: A | Discharge status: Skilled Nursing Facility | Payer: Self-pay | Source: Home / Self Care | Attending: Internal Medicine

## 2023-09-04 ENCOUNTER — Inpatient Hospital Stay (HOSPITAL_COMMUNITY): Payer: 59

## 2023-09-04 DIAGNOSIS — N1831 Chronic kidney disease, stage 3a: Secondary | ICD-10-CM | POA: Diagnosis not present

## 2023-09-04 DIAGNOSIS — I6521 Occlusion and stenosis of right carotid artery: Secondary | ICD-10-CM

## 2023-09-04 DIAGNOSIS — E1169 Type 2 diabetes mellitus with other specified complication: Secondary | ICD-10-CM | POA: Diagnosis not present

## 2023-09-04 DIAGNOSIS — E1159 Type 2 diabetes mellitus with other circulatory complications: Secondary | ICD-10-CM | POA: Diagnosis not present

## 2023-09-04 DIAGNOSIS — I63231 Cerebral infarction due to unspecified occlusion or stenosis of right carotid arteries: Secondary | ICD-10-CM | POA: Diagnosis not present

## 2023-09-04 HISTORY — PX: TRANSCAROTID ARTERY REVASCULARIZATIONÂ: SHX6778

## 2023-09-04 HISTORY — PX: ULTRASOUND GUIDANCE FOR VASCULAR ACCESS: SHX6516

## 2023-09-04 LAB — BASIC METABOLIC PANEL
Anion gap: 11 (ref 5–15)
BUN: 15 mg/dL (ref 8–23)
CO2: 26 mmol/L (ref 22–32)
Calcium: 9 mg/dL (ref 8.9–10.3)
Chloride: 100 mmol/L (ref 98–111)
Creatinine, Ser: 0.84 mg/dL (ref 0.44–1.00)
GFR, Estimated: >60 mL/min (ref >60)
Glucose, Bld: 167 mg/dL — ABNORMAL HIGH (ref 70–99)
Potassium: 3.5 mmol/L (ref 3.5–5.1)
Sodium: 137 mmol/L (ref 135–145)

## 2023-09-04 LAB — GLUCOSE, CAPILLARY
Glucose-Capillary: 158 mg/dL — ABNORMAL HIGH (ref 70–99)
Glucose-Capillary: 178 mg/dL — ABNORMAL HIGH (ref 70–99)
Glucose-Capillary: 133 mg/dL — ABNORMAL HIGH (ref 70–99)
Glucose-Capillary: 148 mg/dL — ABNORMAL HIGH (ref 70–99)
Glucose-Capillary: 173 mg/dL — ABNORMAL HIGH (ref 70–99)

## 2023-09-04 LAB — CBC
HCT: 42.5 % (ref 36.0–46.0)
Hemoglobin: 14 g/dL (ref 12.0–15.0)
MCH: 32.7 pg (ref 26.0–34.0)
MCHC: 32.9 g/dL (ref 30.0–36.0)
MCV: 99.3 fL (ref 80.0–100.0)
Platelets: 183 10*3/uL (ref 150–400)
RBC: 4.28 MIL/uL (ref 3.87–5.11)
RDW: 14.8 % (ref 11.5–15.5)
WBC: 9 10*3/uL (ref 4.0–10.5)
nRBC: 0 % (ref 0.0–0.2)

## 2023-09-04 LAB — POCT ACTIVATED CLOTTING TIME: Activated Clotting Time: 324 s

## 2023-09-04 LAB — SURGICAL PCR SCREEN
MRSA, PCR: NEGATIVE
Staphylococcus aureus: NEGATIVE

## 2023-09-04 SURGERY — TRANSCAROTID ARTERY REVASCULARIZATION (TCAR)
Anesthesia: General | Site: Neck | Laterality: Right

## 2023-09-04 MED ORDER — GLYCOPYRROLATE PF 0.2 MG/ML IJ SOSY
PREFILLED_SYRINGE | INTRAMUSCULAR | Status: DC | PRN
Start: 2023-09-04 — End: 2023-09-04
  Administered 2023-09-04 (×2): .1 mg via INTRAVENOUS

## 2023-09-04 MED ORDER — PROPOFOL 10 MG/ML IV BOLUS
INTRAVENOUS | Status: AC
  Filled 2023-09-04: qty 20

## 2023-09-04 MED ORDER — ROCURONIUM BROMIDE 10 MG/ML (PF) SYRINGE
PREFILLED_SYRINGE | INTRAVENOUS | Status: AC
  Filled 2023-09-04: qty 10

## 2023-09-04 MED ORDER — FENTANYL CITRATE (PF) 100 MCG/2ML IJ SOLN
INTRAMUSCULAR | Status: AC
  Filled 2023-09-04: qty 2

## 2023-09-04 MED ORDER — IODIXANOL 320 MG/ML IV SOLN
INTRAVENOUS | Status: DC | PRN
  Administered 2023-09-04: 32 mL

## 2023-09-04 MED ORDER — FENTANYL CITRATE (PF) 100 MCG/2ML IJ SOLN
25.0000 ug | INTRAMUSCULAR | Status: DC | PRN
  Administered 2023-09-04 (×2): 50 ug via INTRAVENOUS

## 2023-09-04 MED ORDER — FENTANYL CITRATE (PF) 250 MCG/5ML IJ SOLN
INTRAMUSCULAR | Status: DC | PRN
  Administered 2023-09-04 (×2): 50 ug via INTRAVENOUS

## 2023-09-04 MED ORDER — LABETALOL HCL 5 MG/ML IV SOLN
INTRAVENOUS | Status: AC
  Filled 2023-09-04: qty 4

## 2023-09-04 MED ORDER — NALOXONE HCL 0.4 MG/ML IJ SOLN: 0.4000 mg | INTRAMUSCULAR | Status: DC | PRN

## 2023-09-04 MED ORDER — ONDANSETRON HCL 4 MG/2ML IJ SOLN
INTRAMUSCULAR | Status: AC
  Filled 2023-09-04: qty 2

## 2023-09-04 MED ORDER — PHENYLEPHRINE HCL-NACL 20-0.9 MG/250ML-% IV SOLN
INTRAVENOUS | Status: DC | PRN
  Administered 2023-09-04: 30 ug/min via INTRAVENOUS

## 2023-09-04 MED ORDER — HEPARIN SODIUM (PORCINE) 1000 UNIT/ML IJ SOLN
INTRAMUSCULAR | Status: DC | PRN
Start: 2023-09-04 — End: 2023-09-04
  Administered 2023-09-04: 8000 [IU] via INTRAVENOUS

## 2023-09-04 MED ORDER — SODIUM CHLORIDE 0.9 % IV SOLN
INTRAVENOUS | Status: DC | PRN
Start: 2023-09-04 — End: 2023-09-04

## 2023-09-04 MED ORDER — DEXAMETHASONE SODIUM PHOSPHATE 10 MG/ML IJ SOLN
INTRAMUSCULAR | Status: AC
  Filled 2023-09-04: qty 1

## 2023-09-04 MED ORDER — 0.9 % SODIUM CHLORIDE (POUR BTL) OPTIME
TOPICAL | Status: DC | PRN
  Administered 2023-09-04: 1000 mL

## 2023-09-04 MED ORDER — ONDANSETRON HCL 4 MG/2ML IJ SOLN
INTRAMUSCULAR | Status: DC | PRN
  Administered 2023-09-04: 4 mg via INTRAVENOUS

## 2023-09-04 MED ORDER — HEPARIN 6000 UNIT IRRIGATION SOLUTION
Status: DC | PRN
  Administered 2023-09-04: 1

## 2023-09-04 MED ORDER — ACETAMINOPHEN 500 MG PO TABS
ORAL_TABLET | ORAL | Status: AC
  Administered 2023-09-04: 1000 mg via ORAL
  Filled 2023-09-04: qty 2

## 2023-09-04 MED ORDER — ONDANSETRON HCL 4 MG/2ML IJ SOLN: 4.0000 mg | Freq: Once | INTRAMUSCULAR | Status: DC | PRN

## 2023-09-04 MED ORDER — GLYCOPYRROLATE PF 0.2 MG/ML IJ SOSY
PREFILLED_SYRINGE | INTRAMUSCULAR | Status: AC
  Filled 2023-09-04: qty 1

## 2023-09-04 MED ORDER — CHLORHEXIDINE GLUCONATE 0.12 % MT SOLN
OROMUCOSAL | Status: AC
  Administered 2023-09-04: 15 mL
  Filled 2023-09-04: qty 15

## 2023-09-04 MED ORDER — LIDOCAINE 2% (20 MG/ML) 5 ML SYRINGE
INTRAMUSCULAR | Status: AC
  Filled 2023-09-04: qty 5

## 2023-09-04 MED ORDER — LABETALOL HCL 5 MG/ML IV SOLN: 10.0000 mg | Freq: Once | INTRAVENOUS | Status: DC

## 2023-09-04 MED ORDER — FENTANYL CITRATE (PF) 250 MCG/5ML IJ SOLN
INTRAMUSCULAR | Status: AC
  Filled 2023-09-04: qty 5

## 2023-09-04 MED ORDER — DEXAMETHASONE SODIUM PHOSPHATE 10 MG/ML IJ SOLN
INTRAMUSCULAR | Status: DC | PRN
  Administered 2023-09-04: 5 mg via INTRAVENOUS

## 2023-09-04 MED ORDER — SUGAMMADEX SODIUM 200 MG/2ML IV SOLN
INTRAVENOUS | Status: DC | PRN
  Administered 2023-09-04: 200 mg via INTRAVENOUS

## 2023-09-04 MED ORDER — CLEVIDIPINE BUTYRATE 0.5 MG/ML IV EMUL
INTRAVENOUS | Status: DC | PRN
Start: 2023-09-04 — End: 2023-09-04
  Administered 2023-09-04: 2 mg/h via INTRAVENOUS

## 2023-09-04 MED ORDER — ACETAMINOPHEN 500 MG PO TABS: 1000.0000 mg | ORAL_TABLET | Freq: Once | ORAL | Status: AC

## 2023-09-04 MED ORDER — LIDOCAINE 2% (20 MG/ML) 5 ML SYRINGE
INTRAMUSCULAR | Status: DC | PRN
  Administered 2023-09-04: 80 mg via INTRAVENOUS

## 2023-09-04 MED ORDER — HYDROMORPHONE HCL 1 MG/ML IJ SOLN
0.5000 mg | Freq: Once | INTRAMUSCULAR | Status: AC
  Administered 2023-09-04: 0.5 mg via INTRAVENOUS
  Filled 2023-09-04: qty 0.5

## 2023-09-04 MED ORDER — ROCURONIUM BROMIDE 10 MG/ML (PF) SYRINGE
PREFILLED_SYRINGE | INTRAVENOUS | Status: DC | PRN
  Administered 2023-09-04: 50 mg via INTRAVENOUS
  Administered 2023-09-04: 10 mg via INTRAVENOUS

## 2023-09-04 MED ORDER — CLEVIDIPINE BUTYRATE 0.5 MG/ML IV EMUL
INTRAVENOUS | Status: AC
  Filled 2023-09-04: qty 50

## 2023-09-04 MED ORDER — PROPOFOL 10 MG/ML IV BOLUS
INTRAVENOUS | Status: DC | PRN
  Administered 2023-09-04: 100 mg via INTRAVENOUS

## 2023-09-04 MED ORDER — LACTATED RINGERS IV SOLN
INTRAVENOUS | Status: DC | PRN
Start: 2023-09-04 — End: 2023-09-04

## 2023-09-04 SURGICAL SUPPLY — 57 items
ADH SKN CLS APL DERMABOND .7 (GAUZE/BANDAGES/DRESSINGS) ×2
APL SKNCLS STERI-STRIP NONHPOA (GAUZE/BANDAGES/DRESSINGS) ×2
BAG BANDED W/RUBBER/TAPE 36X54 (MISCELLANEOUS) ×2 IMPLANT
BAG COUNTER SPONGE SURGICOUNT (BAG) ×2 IMPLANT
BAG EQP BAND 135X91 W/RBR TAPE (MISCELLANEOUS) ×2
BAG SPNG CNTER NS LX DISP (BAG) ×2
BENZOIN TINCTURE PRP APPL 2/3 (GAUZE/BANDAGES/DRESSINGS) IMPLANT
CANISTER SUCT 3000ML PPV (MISCELLANEOUS) ×2 IMPLANT
CATH BALLN ENROUTE 5X25 (CATHETERS) IMPLANT
CATH BALLN ENROUTE 6X35 (CATHETERS) IMPLANT
CATH BEACON 5 .035 40 KMP TP (CATHETERS) IMPLANT
CATH SHOCKWAVE C2 4.0X12 (CATHETERS) IMPLANT
CLIP TI MEDIUM 6 (CLIP) ×2 IMPLANT
CLIP TI WIDE RED SMALL 6 (CLIP) ×2 IMPLANT
COVER DOME SNAP 22 D (MISCELLANEOUS) ×2 IMPLANT
COVER PROBE W GEL 5X96 (DRAPES) ×2 IMPLANT
DERMABOND ADVANCED .7 DNX12 (GAUZE/BANDAGES/DRESSINGS) ×2 IMPLANT
DRAPE FEMORAL ANGIO 80X135IN (DRAPES) ×2 IMPLANT
DRSG TEGADERM 4X10 (GAUZE/BANDAGES/DRESSINGS) IMPLANT
ELECT REM PT RETURN 9FT ADLT (ELECTROSURGICAL) ×2
ELECTRODE REM PT RTRN 9FT ADLT (ELECTROSURGICAL) ×2 IMPLANT
GAUZE SPONGE 4X4 12PLY STRL (GAUZE/BANDAGES/DRESSINGS) IMPLANT
GLOVE BIOGEL PI IND STRL 7.0 (GLOVE) ×2 IMPLANT
GOWN STRL REUS W/ TWL LRG LVL3 (GOWN DISPOSABLE) ×4 IMPLANT
GOWN STRL REUS W/ TWL XL LVL3 (GOWN DISPOSABLE) ×2 IMPLANT
GOWN STRL REUS W/TWL LRG LVL3 (GOWN DISPOSABLE) ×4
GOWN STRL REUS W/TWL XL LVL3 (GOWN DISPOSABLE) ×2
GUIDEWIRE ENROUTE 0.014 (WIRE) ×2 IMPLANT
KIT BASIN OR (CUSTOM PROCEDURE TRAY) ×2 IMPLANT
KIT ENCORE 26 ADVANTAGE (KITS) ×2 IMPLANT
KIT INTRODUCER GALT 7 (INTRODUCER) ×2 IMPLANT
KIT TURNOVER KIT B (KITS) ×2 IMPLANT
PACK CAROTID (CUSTOM PROCEDURE TRAY) ×2 IMPLANT
POSITIONER HEAD DONUT 9IN (MISCELLANEOUS) ×2 IMPLANT
POWDER SURGICEL 3.0 GRAM (HEMOSTASIS) IMPLANT
SET MICROPUNCTURE 5F STIFF (MISCELLANEOUS) ×2 IMPLANT
STENT TRANSCAROTID SYSTEM 8X40 (Permanent Stent) IMPLANT
STOPCOCK 4 WAY LG BORE MALE ST (IV SETS) IMPLANT
STRIP CLOSURE SKIN 1/2X4 (GAUZE/BANDAGES/DRESSINGS) IMPLANT
SUT MNCRL AB 4-0 PS2 18 (SUTURE) ×2 IMPLANT
SUT PROLENE 5 0 C 1 24 (SUTURE) ×2 IMPLANT
SUT SILK 2 0 PERMA HAND 18 BK (SUTURE) ×2 IMPLANT
SUT SILK 3 0 (SUTURE)
SUT SILK 3-0 18XBRD TIE 12 (SUTURE) IMPLANT
SUT VIC AB 3-0 SH 27 (SUTURE) ×2
SUT VIC AB 3-0 SH 27X BRD (SUTURE) ×2 IMPLANT
SYR 10ML LL (SYRINGE) ×6 IMPLANT
SYR 20ML LL LF (SYRINGE) ×2 IMPLANT
SYR CONTROL 10ML LL (SYRINGE) IMPLANT
SYSTEM TRANSCAROTID NEUROPRTCT (MISCELLANEOUS) ×2 IMPLANT
TOWEL GREEN STERILE (TOWEL DISPOSABLE) ×2 IMPLANT
TRANSCAROTID NEUROPROTECT SYS (MISCELLANEOUS) ×2
TUBING CIL FLEX 10 FLL-RA (TUBING) IMPLANT
WATER STERILE IRR 1000ML POUR (IV SOLUTION) ×2 IMPLANT
WIRE BENTSON .035X145CM (WIRE) ×2 IMPLANT
WIRE HI TORQ COMMND ES.014X300 (WIRE) IMPLANT
WIRE TORQFLEX AUST .018X40CM (WIRE) IMPLANT

## 2023-09-04 NOTE — Progress Notes (Signed)
Occupational Therapy Treatment Patient Details Name: Amanda Stewart MRN: [id number] DOB: [birth year] Today's Date: 09/04/2023   History of present illness Patient is a 82 year old female who presents from home with multiple falls and generalized weakness. Family noted left facial droop and slurred speech. History of pacemaker. Recent hospital admission with orthostasis and falls at home.   OT comments  Pt is making good progress towards their acute OT goals. She was eager to participate upon arrival. Overall she needed min A for bed mobility, and mod-min A to stand and CGA for 3 bouts of short mobility with the RW. Pt reported her dizziness as a 5/10, VSS. Pt continues to have poor cognition, needing 1 step cues to initiate all tasks and unable to carryout 2 step directions. OT to continue to follow acutely to facilitate progress towards established goals. Pt will continue to benefit from skilled inpatient follow up therapy, <3 hours/day.       If plan is discharge home, recommend the following:  Two people to help with walking and/or transfers;A lot of help with bathing/dressing/bathroom;Assistance with cooking/housework;Direct supervision/assist for medications management;Direct supervision/assist for financial management;Assist for transportation;Help with stairs or ramp for entrance   Equipment Recommendations  Other (comment)       Precautions / Restrictions Precautions Precautions: Fall Precaution Comments: 8 falls in the last several weeks Restrictions Weight Bearing Restrictions: No       Mobility Bed Mobility Overal bed mobility: Needs Assistance Bed Mobility: Supine to Sit     Supine to sit: Min assist          Transfers Overall transfer level: Needs assistance Equipment used: Rolling walker (2 wheels) Transfers: Sit to/from Stand, Bed to chair/wheelchair/BSC Sit to Stand: Mod assist           General transfer comment: mod A for initial stand, 2 more trials with  just min A. Gait was CGA     Balance Overall balance assessment: Needs assistance Sitting-balance support: Feet supported Sitting balance-Leahy Scale: Fair     Standing balance support: Reliant on assistive device for balance, Bilateral upper extremity supported Standing balance-Leahy Scale: Poor                             ADL either performed or assessed with clinical judgement   ADL Overall ADL's : Needs assistance/impaired                     Lower Body Dressing: Moderate assistance;Sit to/from stand Lower Body Dressing Details (indicate cue type and reason): pt able to don socks with a littel assist to thread the toes Toilet Transfer: Moderate assistance;Ambulation;Rolling walker (2 wheels);BSC/3in1 Toilet Transfer Details (indicate cue type and reason): mod A for STS, CGA for short mobility with RW         Functional mobility during ADLs: Moderate assistance;Rolling walker (2 wheels) General ADL Comments: one step cues needed for all tasks    Extremity/Trunk Assessment Upper Extremity Assessment Upper Extremity Assessment: LUE deficits/detail RUE Deficits / Details: AROM grossly WFL for functional tasks LUE Deficits / Details: mild decreased grip strength compared to right, decreased ability to sequence fine motor movements, now states that sensation is the same as R side LUE Sensation: WNL LUE Coordination: decreased fine motor;decreased gross motor   Lower Extremity Assessment Lower Extremity Assessment: Defer to PT evaluation        Vision   Vision Assessment?: Yes Additional Comments:  overall WFL for tasks assessed this date   Perception Perception Perception: Within Functional Limits   Praxis Praxis Praxis: Impaired Praxis Impairment Details: Motor planning;Initiation    Cognition Arousal: Alert Behavior During Therapy: Flat affect Overall Cognitive Status: Impaired/Different from baseline Area of Impairment: Following commands,  Safety/judgement, Awareness, Problem solving, Memory                   Current Attention Level: Sustained Memory: Decreased short-term memory Following Commands: Follows one step commands consistently Safety/Judgement: Decreased awareness of deficits, Decreased awareness of safety Awareness: Emergent Problem Solving: Slow processing, Decreased initiation, Requires verbal cues General Comments: required 1 step commands, flat affect              General Comments VSS, pt gave her dizziness a 5/10    Pertinent Vitals/ Pain       Pain Assessment Pain Assessment: Faces Faces Pain Scale: Hurts a little bit Pain Location: RLE Pain Descriptors / Indicators: Discomfort Pain Intervention(s): Limited activity within patient's tolerance, Monitored during session  Home Living Family/patient expects to be discharged to:: Private residence Living Arrangements: Alone              Frequency  Min 1X/week        Progress Toward Goals  OT Goals(current goals can now be found in the care plan section)  Progress towards OT goals: Progressing toward goals  Acute Rehab OT Goals Patient Stated Goal: to walk OT Goal Formulation: With patient Time For Goal Achievement: 09/14/23 Potential to Achieve Goals: Fair ADL Goals Pt Will Perform Lower Body Bathing: with contact guard assist;sitting/lateral leans;sit to/from stand Pt Will Perform Lower Body Dressing: with contact guard assist;sitting/lateral leans;sit to/from stand Pt Will Transfer to Toilet: with contact guard assist;ambulating;regular height toilet;grab bars Pt Will Perform Toileting - Clothing Manipulation and hygiene: with contact guard assist;sit to/from stand;sitting/lateral leans Additional ADL Goal #1: Patient will be able to complete functional task in standing for 3 minutes prior to needing seated rest break in order to increase overall activity tolerance.   AM-PAC OT "6 Clicks" Daily Activity     Outcome  Measure   Help from another person eating meals?: A Little Help from another person taking care of personal grooming?: A Little Help from another person toileting, which includes using toliet, bedpan, or urinal?: A Lot Help from another person bathing (including washing, rinsing, drying)?: A Lot Help from another person to put on and taking off regular upper body clothing?: A Little Help from another person to put on and taking off regular lower body clothing?: A Lot 6 Click Score: 15    End of Session Equipment Utilized During Treatment: Gait belt;Rolling walker (2 wheels)  OT Visit Diagnosis: Unsteadiness on feet (R26.81);Other abnormalities of gait and mobility (R26.89);History of falling (Z91.81);Muscle weakness (generalized) (M62.81);Pain;Repeated falls (R29.6)   Activity Tolerance Patient limited by fatigue   Patient Left in bed;with call bell/phone within reach (transport taking pt)   Nurse Communication Mobility status        Time: 1610-9604 OT Time Calculation (min): 16 min  Charges: OT General Charges $OT Visit: 1 Visit OT Treatments $Therapeutic Activity: 8-22 mins  Derenda Mis, OTR/L Acute Rehabilitation Services Office [phone] Secure Chat Communication Preferred   Donia Pounds 09/04/2023, 12:58 PM

## 2023-09-04 NOTE — Anesthesia Preprocedure Evaluation (Addendum)
Anesthesia Evaluation  Patient identified by MRN, date of birth, ID band Patient awake    Reviewed: Allergy & Precautions, NPO status , Patient's Chart, lab work & pertinent test results, reviewed documented beta blocker date and time   Airway Mallampati: II  TM Distance: >3 FB Neck ROM: Full    Dental  (+) Dental Advisory Given, Poor Dentition, Missing   Pulmonary neg pulmonary ROS   Pulmonary exam normal breath sounds clear to auscultation       Cardiovascular hypertension, Pt. on medications and Pt. on home beta blockers + CAD, + Past MI and + Peripheral Vascular Disease (S/P SYMPTOMATIC RIGHT ICA)  Normal cardiovascular exam+ dysrhythmias (LBBB) + pacemaker  Rhythm:Regular Rate:Normal     Neuro/Psych  Neuromuscular disease CVA    GI/Hepatic Neg liver ROS,GERD  Medicated,,  Endo/Other  diabetes, Type 2, Oral Hypoglycemic Agents, Insulin Dependent    Renal/GU Renal InsufficiencyRenal disease     Musculoskeletal negative musculoskeletal ROS (+)    Abdominal   Peds  Hematology negative hematology ROS (+)   Anesthesia Other Findings Day of surgery medications reviewed with the patient.  Reproductive/Obstetrics                             Anesthesia Physical Anesthesia Plan  ASA: 3  Anesthesia Plan: General   Post-op Pain Management: Tylenol PO (pre-op)*   Induction: Intravenous  PONV Risk Score and Plan: 3 and Dexamethasone and Ondansetron  Airway Management Planned: Oral ETT  Additional Equipment: Arterial line  Intra-op Plan:   Post-operative Plan: Extubation in OR  Informed Consent: I have reviewed the patients History and Physical, chart, labs and discussed the procedure including the risks, benefits and alternatives for the proposed anesthesia with the patient or authorized representative who has indicated his/her understanding and acceptance.     Dental advisory  given  Plan Discussed with: CRNA  Anesthesia Plan Comments:         Anesthesia Quick Evaluation

## 2023-09-04 NOTE — Op Note (Addendum)
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 female with a history of diabetes, HTN, HLD, CAD and CKD stage IIIa who has been admitted with a R MCA territory stroke which presented as facial droop and slurred speech. She was found to have a severe R ICA stenosis. The stenosis is significantly calcified but given her age, medical comorbidities and her anatomy with a significant medial course of her ICA I recommended TCAR with lithotripsy. The risks, benefits and alternatives (including CEA) were reviewed with the patient and her daughter, they expressed understanding and were willing to proceed.  FINDINGS:   Severe string sign stenosis of proximal right ICA Severe calcific lesion Successful dilation and stent placement with < 50% residual stenosis   Given the complexity of the case,  the assistant was necessary in order to expedient the procedure and safely perform the technical aspects of the operation.  The assistant provided traction and countertraction while inserting the TCAR sheath. The assistant also played a critical role in pinning wires while advancing balloons and the stent across the critical stenosis. These skills could not have been adequately performed by a scrub tech assistant.    TECHNIQUE:   The patient was transferred to the operating room and positioned supine on the operating room table. Anesthesia was induced. The neck and groins were prepped and draped in standard fashion. A surgical timeout was performed confirming correct patient, procedure, and operative location.   Using intraoperative ultrasound  the course of the left common carotid artery was mapped on the skin. A longitudinal 3-4 cm incision was made between the sternal and clavicular heads of the sternocleidomastoid muscle. The omohyoid was transected. Following longitudinal division of the carotid sheath the jugular vein was partially dissected and retracted laterally. Once 3 cm of common carotid artery (CCA) were isolated, umbilical tape was placed around the proximal 1/3 of the CCA under direct vision. A vessel loop was also placed around the artery just proximal to the umbilical tape in Pots fashion. A 5.0 polypropylene suture was pre-placed in the anterior wall of the CCA, in a "U stitch" configuration, close to the clavicle to facilitate hemostasis upon removal of the arterial sheath at completion of the TCAR procedure.  The left common femoral vein (CFV) was accessed under ultrasound guidance, using standard Seldinger and micropuncture access technique. Permanent recorded image(s) was/were saved in the patient's medical record. The Venous Return Sheath was advanced into the CFV over the 0.035" wire provided. Blood was aspirated from the flow line followed by flushing of the Venous Sheath with heparinized saline. The Venous Sheath was secured to the patient's skin with suture to maintain optimal position in the vessel.  Heparin was given to obtain a therapeutic activated clotting time >250 seconds prior to arterial access. A 4-French non-stiffened ENHANCE Transcarotid / Peripheral Access set was used, puncturing the artery with the 21G needle through the pre-placed "U" stitch while holding gentle traction on the umbilical tape to stabilize and centralize the CCA within the incision. Careful attention was paid to the change in CCA  shape when using the umbilical tape to control or lift the artery. The micropuncture wire was then advanced 3-4 cm into the CCA and, the 21G needle was removed. The micropuncture sheath was advanced 2-3 cm into the  CCA and the wire and dilator were removed. Pulsatile backflow indicated correct positioning. The provided 0.035" J-tipped guidewire was inserted as close as possible to the bifurcation without engaging the lesion. After micropuncture sheath removal, the Transcarotid Arterial Sheath was advanced to the 2.5cm marker and the 0.035" wire and dilator were then removed. Arterial Sheath position was assessed under fluoroscopy in two projections to ensure that the sheath tip was oriented coaxially in the CCA. The Arterial Sheath was sutured to the patient with gentle forward tension.  The Flow Controller was connected to the Transcarotid Arterial Sheath, prepared by passively allowing a column of arterial blood to fill the line and connected to the Venous Return Sheath. CCA inflow was occluded proximal to the arteriotomy with the vessel loop to achieve active flow reversal. To confirm flow reversal, a saline bolus was delivered into the venous flow line on both "High" and "Low" flow settings of the Flow Controller. Angiograms were performed with slow injections of a small amount of contrast filling just past the lesion to minimize antegrade transmission of micro-bubbles.  Prior to lesion manipulation, heart rate (70bpm) and systolic BP (90-110 MAP) were managed upwards to optimize flow reversal and procedural neuroprotection. The lesion was crossed with an 0.014" Command wire and intravascular lithotripsy was performed with 4x67mm shockwave balloon with 4 rounds of 10 pulses per round. This was followed by pre-dilation of the lesion with a 5mm x 25mm rapid exchange 0.014" compatible balloon catheter to 8 atmospheres. Stenting was performed with an 8mm x 40mm [id number] Transcarotid stent, sized appropriately to the right CCA. The stent was slightly pinched in the mid segment so a 6x35mm balloon was used to post dilate. AP and lateral angiograms (gentle contrast injections) were performed to confirm stent placement and  arterial wall stent apposition.  At [facility] case completion, antegrade flow was restored by releasing the vessel loop on the CCA then closing the NPS stopcocks to the flow lines. The Transcarotid Arterial Sheath was removed and the pre-closure suture was tied. Heparin reversal was employed and a drain was placed.  The Venous Return Sheath was removed and hemostasis was achieved with brief manual compression.  The patient tolerated the procedure well and was extubated on the table. The patient was moving all four extremities to command prior to transfer to the recovery room.   Daria Pastures, MD [facility] DATE OF DICTATION:   09/04/2023

## 2023-09-04 NOTE — Progress Notes (Signed)
TRH night cross cover note:   I was notified by RN that the patient is complaining of discomfort associated with her right hip.  I subsequently placed an order for Dilaudid 0.5 mg IV x 1 dose now.     Newton Pigg, DO Hospitalist

## 2023-09-04 NOTE — Transfer of Care (Signed)
Immediate Anesthesia Transfer of Care Note  Patient: Amanda Stewart  Procedure(s) Performed: Transcarotid Artery Revascularization (Right: Neck) INTRAVASCULAR LITHOTRIPSY PROCEDURE (Right: Neck) ULTRASOUND GUIDANCE FOR VASCULAR ACCESS (Left: Groin)  Patient Location: PACU  Anesthesia Type:General  Level of Consciousness: awake, alert , and oriented  Airway & Oxygen Therapy: Patient Spontanous Breathing and Patient connected to nasal cannula oxygen  Post-op Assessment: Report given to RN and Post -op Vital signs reviewed and stable  Post vital signs: Reviewed and stable  Last Vitals:  Vitals Value Taken Time  BP 111/53 09/04/23 1650  Temp    Pulse 69 09/04/23 1651  Resp 16   SpO2 95 % 09/04/23 1651  Vitals shown include unfiled device data.  Last Pain:  Vitals:   09/04/23 1247  TempSrc:   PainSc: 0-No pain      Patients Stated Pain Goal: 0 (09/02/23 2045)  Complications: No notable events documented.

## 2023-09-04 NOTE — TOC Progression Note (Signed)
Transition of Care [facility]) - Progression Note    Patient Details  Name: Amanda Stewart MRN: [id number] Date of Birth: 04/01/1941  Transition of Care [facility]) CM/SW Contact  Baldemar Lenis, [place] Phone Number: 09/04/2023, 10:53 AM  Clinical Narrative:   CSW received update from family asking about [facility]. [facility], family would like to accept. CSW requested CMA to initiate insurance authorization, authorization pending. CSW to follow.    Expected Discharge Plan: Skilled Nursing Facility Barriers to Discharge: Continued Medical Work up, English as a second language teacher  Expected Discharge Plan and Services     Post Acute Care Choice: Skilled Nursing Facility Living arrangements for the past 2 months: Apartment                                       Social Determinants of Health (SDOH) Interventions SDOH Screenings   Food Insecurity: No Food Insecurity (08/12/2023)  Housing: Low Risk  (08/12/2023)  Transportation Needs: No Transportation Needs (08/12/2023)  Utilities: Not At Risk (08/12/2023)  Tobacco Use: Low Risk  (08/29/2023)    Readmission Risk Interventions     No data to display

## 2023-09-04 NOTE — Progress Notes (Addendum)
  Progress Note    09/04/2023 6:55 AM Hospital Day 6  Subjective:  sleeping, I did not wake her.  afebrile  Vitals:   09/03/23 2311 09/04/23 0342  BP: (!) 119/106 (!) 160/80  Pulse: 82 85  Resp: 16 16  Temp: 97.8 F (36.6 C) 98.2 F (36.8 C)  SpO2: 96% 98%    Physical Exam: General:  sleeping Lungs:  non labored   CBC    Component Value Date/Time   WBC 7.1 09/03/2023 0834   RBC 3.98 09/03/2023 0834   HGB 12.5 09/03/2023 0834   HCT 37.8 09/03/2023 0834   PLT 179 09/03/2023 0834   MCV 95.0 09/03/2023 0834   MCH 31.4 09/03/2023 0834   MCHC 33.1 09/03/2023 0834   RDW 14.4 09/03/2023 0834   LYMPHSABS 1.4 08/29/2023 1630   MONOABS 0.5 08/29/2023 1630   EOSABS 0.0 08/29/2023 1630   BASOSABS 0.1 08/29/2023 1630    BMET    Component Value Date/Time   NA 137 09/04/2023 0525   K 3.5 09/04/2023 0525   CL 100 09/04/2023 0525   CO2 26 09/04/2023 0525   GLUCOSE 167 (H) 09/04/2023 0525   BUN 15 09/04/2023 0525   CREATININE 0.84 09/04/2023 0525   CREATININE 1.25 (H) 12/22/2022 0000   CALCIUM 9.0 09/04/2023 0525   GFRNONAA >60 09/04/2023 0525   GFRNONAA 59 (L) 01/05/2021 0948   GFRAA 69 01/05/2021 0948    INR    Component Value Date/Time   INR 0.9 08/29/2023 1630     Intake/Output Summary (Last 24 hours) at 09/04/2023 0655 Last data filed at 09/04/2023 0424 Gross per 24 hour  Intake 236 ml  Output 1350 ml  Net -1114 ml     Assessment/Plan:  82 y.o. female  with symptomatic right ICA stenosis   Hospital Day 6  -plan for right TCAR with possible lithotripsy later today.   -creatinine 0.84 this am.  -Continue npo. -Dr. Hetty Blend discussed with pt and daughter yesterday.   -continue asa/statin/Plavix   Doreatha Massed, PA-C [facility] [phone] 09/04/2023 6:55 AM    VASCULAR STAFF ADDENDUM: I have independently interviewed and examined the patient. I agree with the above.  Risks and benefits of carotid intervention were  reviewed with the patient and family, they expressed understanding and willing to proceed.  Daria Pastures MD [facility] Phone Number: [phone] 09/04/2023 2:27 PM

## 2023-09-04 NOTE — Anesthesia Procedure Notes (Signed)
Procedure Name: Intubation Date/Time: 09/04/2023 2:49 PM  Performed by: Allyn Kenner, CRNAPre-anesthesia Checklist: Patient identified, Emergency Drugs available, Suction available and Patient being monitored Patient Re-evaluated:Patient Re-evaluated prior to induction Oxygen Delivery Method: Circle System Utilized Preoxygenation: Pre-oxygenation with 100% oxygen Induction Type: IV induction Ventilation: Mask ventilation without difficulty Laryngoscope Size: Mac and 3 Tube type: Oral Tube size: 7.0 mm Number of attempts: 1 Airway Equipment and Method: Stylet and Oral airway Placement Confirmation: ETT inserted through vocal cords under direct vision, positive ETCO2 and breath sounds checked- equal and bilateral Tube secured with: Tape Dental Injury: Teeth and Oropharynx as per pre-operative assessment

## 2023-09-04 NOTE — Progress Notes (Signed)
Patient off floor to OR

## 2023-09-04 NOTE — Anesthesia Procedure Notes (Signed)
Arterial Line Insertion Start/End10/06/2023 1:30 PM, 09/04/2023 1:40 PM Performed by: Randon Goldsmith, CRNA, CRNA  Patient location: Pre-op. Preanesthetic checklist: patient identified, IV checked, site marked, risks and benefits discussed, surgical consent, monitors and equipment checked, pre-op evaluation, timeout performed and anesthesia consent Lidocaine 1% used for infiltration Left, radial was placed Catheter size: 20 G Hand hygiene performed , maximum sterile barriers used  and Seldinger technique used Allen's test indicative of satisfactory collateral circulation Attempts: 1 Procedure performed using ultrasound guided technique. Ultrasound Notes:anatomy identified, needle tip was noted to be adjacent to the nerve/plexus identified and no ultrasound evidence of intravascular and/or intraneural injection Following insertion, dressing applied. Post procedure assessment: normal and unchanged  Patient tolerated the procedure well with no immediate complications.

## 2023-09-04 NOTE — Progress Notes (Signed)
PROGRESS NOTE    Amanda Stewart  UJW:[id number] DOB: 1941/10/05 DOA: 08/29/2023 PCP: Fleet Contras, MD    Brief Narrative:    Amanda Stewart is a 82 y.o. female with medical history significant for coronary artery disease, chronic diastolic CHF, type 2 diabetes, diabetic polyneuropathy, hyperlipidemia, hypertension, LBBB, status post pacemaker placement, peripheral artery disease on Aspirin and Pletal, CKD 3b, who presented from home after multiple falls and generalized weakness.  Patient daughter noted left facial droop and slurred speech 1 day prior to presentation.  Patient was recently admitted from 08/11/2023 through 08/13/2023 due to generalized weakness, dizziness, found to have orthostatic hypotension and was discharged to home with home health PT OT.  In the ED on this presentation, patient was seen by neurology and stroke team.  CT head and cervical spine was negative.  Vitals were stable.  Labs remarkable for elevated glucose at 223 creatinine of 1.2.  Patient was then considered for admission to hospital for further evaluation and treatment.  During hospitalization, patient was noted to have a symptomatic right ICA stenosis and vascular surgery was consulted.  At this time plan is TCAR with lithotripsy.  Assessment and plan.  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 Patient had acute left facial and left-sided weakness.  CT head negative.    CT angiogram of the head and neck without any intracranial occlusion.  MRI of the brain showed a mixed watershed and embolic infarct in the right MCA territory.  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  2D echo on 08/12/23 showed LV ejection fraction of 65 to 70%  with LVH.  Neurology on board.  Pacemaker interrogation was done.  Continue aspirin and Plavix.  Vascular surgery has recommendations for TCAR with lithotripsy likely 09/04/2023.  PT has recommended CIR placement at this time.  History of pacemaker placement.     Hypokalemia.    Replenished.  Latest potassium of 3.5.  Type 2 diabetes with hyperglycemia Hemoglobin A1c, most recent 10.1 on 08/12/23.  Continue diabetic diet, insulin sliding scale.  Latest POC glucose of 178   CKD 3B At baseline.  Latest creatinine at 0.9.Marland Kitchen  Check BMP in AM.   HFpEF Continue intake and output charting Daily weights.  Recent 2D echocardiogram with preserved LV function.  Appears compensated   Generalized weakness Frequent falls Continue fall precautions.  PT has recommended CIR placement.     DVT prophylaxis: enoxaparin (LOVENOX) injection 40 mg Start: 08/29/23 2315   Code Status:     Code Status: Full Code  Disposition: Likely to CIR after surgery.  Status is: Inpatient  Remains inpatient appropriate because: Stroke, vascular surgery  for TCAR on 09/04/2023.   Family Communication:  Spoke with the daughter on the phone on 09/04/2023 and answered queries she had..   Consultants:  Neurology Vascular surgery  Procedures:  Pacemaker interrogation  Antimicrobials:  None  Anti-infectives (From admission, onward)    Start     Dose/Rate Route Frequency Ordered Stop   09/04/23 1145  ceFAZolin (ANCEF) IVPB 1 g/50 mL premix       Note to Pharmacy: Send with pt to OR   1 g 100 mL/hr over 30 Minutes Intravenous On call 09/03/23 1738 09/05/23 1145  Subjective: Today, patient was seen and examined at bedside.  Denies any nausea, vomiting, headache or dizziness.  Has a mild right hip pain.  No shortness of breath chest pain palpitation.  Aware of surgery today.  Objective: Vitals:   09/03/23 1950 09/03/23 2311 09/04/23 0342 09/04/23 0825  BP: (!) 159/76 (!) 119/106 (!) 160/80 (!) 146/62   Pulse: 85 82 85 85  Resp: 18 16 16 18   Temp: 98.1 F (36.7 C) 97.8 F (36.6 C) 98.2 F (36.8 C) 97.6 F (36.4 C)  TempSrc: Oral Oral Oral Oral  SpO2: 99% 96% 98% 100%  Weight:      Height:        Intake/Output Summary (Last 24 hours) at 09/04/2023 0937 Last data filed at 09/04/2023 0424 Gross per 24 hour  Intake 236 ml  Output 1350 ml  Net -1114 ml   Filed Weights   08/29/23 1621  Weight: 78.4 kg    Physical Examination: Body mass index is 28.76 kg/m.   General:  Average built, not in obvious distress, hard of hearing, elderly female, Communicative HENT:   No scleral pallor or icterus noted. Oral mucosa is moist.  Chest:  .  Diminished breath sounds bilaterally. No crackles or wheezes.  CVS: S1 &S2 heard. No murmur.  Regular rate and rhythm. Abdomen: Soft, nontender, nondistended.  Bowel sounds are heard.   Extremities: No cyanosis, clubbing or edema.   Psych: Alert, awake and Communicative, CNS: Oriented to place and person, mild left-sided decreased strength.  Flat affect, Skin: Warm and dry.  No rashes noted.  Moving all extremities.  Data Reviewed:   CBC: Recent Labs  Lab 08/29/23 1630 08/29/23 1637 08/31/23 0657 09/02/23 0954 09/03/23 0834 09/04/23 0525  WBC 8.0  --  6.2 5.8 7.1 9.0  NEUTROABS 6.0  --   --   --   --   --   HGB 12.7 12.9 12.6 12.6 12.5 14.0  HCT 38.6 38.0 37.5 36.6 37.8 42.5  MCV 97.7  --  94.0 95.6 95.0 99.3  PLT 196  --  188 178 179 183    Basic Metabolic Panel: Recent Labs  Lab 08/29/23 1630 08/29/23 1637 08/31/23 0657 09/02/23 0954 09/03/23 0834 09/04/23 0525  NA 141 141 141 137 138 137  K 3.6 3.6 3.2* 4.8 3.5 3.5  CL 107 109 102 102 102 100  CO2 20*  --  27 22 27 26   GLUCOSE 282* 283* 128* 254* 188* 167*  BUN 17 17 10 12 11 15   CREATININE 1.18* 1.20* 1.01* 0.93 0.93 0.84  CALCIUM 9.5  --  8.9 8.7* 8.8* 9.0  MG  --   --  2.0 1.9  --   --     Liver Function Tests: Recent Labs  Lab 08/29/23 1630  AST 29  ALT 30   ALKPHOS 118  BILITOT 0.7  PROT 6.9  ALBUMIN 3.9     Radiology Studies: No results found.    LOS: 6 days    Joycelyn Das, MD [facility] Available via Epic secure chat 7am-7pm After these hours, please refer to coverage provider listed on [email or web address] 09/04/2023, 9:37 AM

## 2023-09-04 NOTE — Anesthesia Postprocedure Evaluation (Signed)
Anesthesia Post Note  Patient: Amanda Stewart  Procedure(s) Performed: Transcarotid Artery Revascularization (Right: Neck) INTRAVASCULAR LITHOTRIPSY PROCEDURE (Right: Neck) ULTRASOUND GUIDANCE FOR VASCULAR ACCESS (Left: Groin)     Patient location during evaluation: PACU Anesthesia Type: General Level of consciousness: awake and alert Pain management: pain level controlled Vital Signs Assessment: post-procedure vital signs reviewed and stable Respiratory status: spontaneous breathing, nonlabored ventilation, respiratory function stable and patient connected to nasal cannula oxygen Cardiovascular status: blood pressure returned to baseline and stable Postop Assessment: no apparent nausea or vomiting Anesthetic complications: no   No notable events documented.  Last Vitals:  Vitals:   09/04/23 1654 09/04/23 1709  BP: (!) 111/53 (!) 126/57  Pulse: 68 71  Resp: 15 18  Temp: 36.4 C   SpO2: 93% 94%    Last Pain:  Vitals:   09/04/23 1654  TempSrc:   PainSc: 0-No pain    LLE Motor Response: Purposeful movement (09/04/23 1709) LLE Sensation: Full sensation (09/04/23 1709) RLE Motor Response: Purposeful movement (09/04/23 1709) RLE Sensation: Full sensation (09/04/23 1709)      Collene Schlichter

## 2023-09-05 DIAGNOSIS — I63231 Cerebral infarction due to unspecified occlusion or stenosis of right carotid arteries: Secondary | ICD-10-CM | POA: Diagnosis not present

## 2023-09-05 LAB — CBC
HCT: 37.2 % (ref 36.0–46.0)
Hemoglobin: 11.8 g/dL — ABNORMAL LOW (ref 12.0–15.0)
MCH: 31.1 pg (ref 26.0–34.0)
MCHC: 31.7 g/dL (ref 30.0–36.0)
MCV: 97.9 fL (ref 80.0–100.0)
Platelets: 180 10*3/uL (ref 150–400)
RBC: 3.8 MIL/uL — ABNORMAL LOW (ref 3.87–5.11)
RDW: 14.8 % (ref 11.5–15.5)
WBC: 11.4 10*3/uL — ABNORMAL HIGH (ref 4.0–10.5)
nRBC: 0 % (ref 0.0–0.2)

## 2023-09-05 LAB — BASIC METABOLIC PANEL
Anion gap: 14 (ref 5–15)
BUN: 29 mg/dL — ABNORMAL HIGH (ref 8–23)
CO2: 24 mmol/L (ref 22–32)
Calcium: 9.1 mg/dL (ref 8.9–10.3)
Chloride: 100 mmol/L (ref 98–111)
Creatinine, Ser: 1.21 mg/dL — ABNORMAL HIGH (ref 0.44–1.00)
GFR, Estimated: 44 mL/min — ABNORMAL LOW (ref >60)
Glucose, Bld: 195 mg/dL — ABNORMAL HIGH (ref 70–99)
Potassium: 4.5 mmol/L (ref 3.5–5.1)
Sodium: 138 mmol/L (ref 135–145)

## 2023-09-05 LAB — GLUCOSE, CAPILLARY
Glucose-Capillary: 151 mg/dL — ABNORMAL HIGH (ref 70–99)
Glucose-Capillary: 177 mg/dL — ABNORMAL HIGH (ref 70–99)
Glucose-Capillary: 188 mg/dL — ABNORMAL HIGH (ref 70–99)
Glucose-Capillary: 194 mg/dL — ABNORMAL HIGH (ref 70–99)
Glucose-Capillary: 199 mg/dL — ABNORMAL HIGH (ref 70–99)

## 2023-09-05 LAB — MAGNESIUM: Magnesium: 1.9 mg/dL (ref 1.7–2.4)

## 2023-09-05 MED ORDER — METOPROLOL TARTRATE 5 MG/5ML IV SOLN: 2.0000 mg | INTRAVENOUS | Status: DC | PRN

## 2023-09-05 MED ORDER — LABETALOL HCL 5 MG/ML IV SOLN: 10.0000 mg | INTRAVENOUS | Status: DC | PRN

## 2023-09-05 MED ORDER — GUAIFENESIN-DM 100-10 MG/5ML PO SYRP: 15.0000 mL | ORAL_SOLUTION | ORAL | Status: DC | PRN

## 2023-09-05 MED ORDER — ENSURE ENLIVE PO LIQD
237.0000 mL | Freq: Two times a day (BID) | ORAL | Status: DC
  Administered 2023-09-05 – 2023-09-06 (×3): 237 mL via ORAL

## 2023-09-05 MED ORDER — HYDROMORPHONE HCL 1 MG/ML IJ SOLN
0.5000 mg | INTRAMUSCULAR | Status: AC | PRN
  Administered 2023-09-05 (×2): 0.5 mg via INTRAVENOUS
  Filled 2023-09-05 (×2): qty 0.5

## 2023-09-05 MED ORDER — SODIUM CHLORIDE 0.9 % IV SOLN: INTRAVENOUS | Status: DC

## 2023-09-05 MED ORDER — ALUM & MAG HYDROXIDE-SIMETH 200-200-20 MG/5ML PO SUSP: 15.0000 mL | ORAL | Status: DC | PRN

## 2023-09-05 MED ORDER — ENOXAPARIN SODIUM 40 MG/0.4ML IJ SOSY
40.0000 mg | PREFILLED_SYRINGE | Freq: Every day | INTRAMUSCULAR | Status: DC
  Administered 2023-09-05: 40 mg via SUBCUTANEOUS
  Filled 2023-09-05: qty 0.4

## 2023-09-05 MED ORDER — CEFAZOLIN SODIUM-DEXTROSE 2-4 GM/100ML-% IV SOLN
2.0000 g | Freq: Three times a day (TID) | INTRAVENOUS | Status: AC
  Administered 2023-09-05 (×2): 2 g via INTRAVENOUS
  Filled 2023-09-05 (×2): qty 100

## 2023-09-05 MED ORDER — OXYCODONE-ACETAMINOPHEN 5-325 MG PO TABS
1.0000 | ORAL_TABLET | ORAL | Status: DC | PRN
  Administered 2023-09-05 – 2023-09-07 (×5): 2 via ORAL
  Filled 2023-09-05 (×5): qty 2

## 2023-09-05 MED ORDER — PHENOL 1.4 % MT LIQD: 1.0000 | OROMUCOSAL | Status: DC | PRN

## 2023-09-05 MED ORDER — HYDRALAZINE HCL 20 MG/ML IJ SOLN: 5.0000 mg | INTRAMUSCULAR | Status: DC | PRN

## 2023-09-05 MED ORDER — ONDANSETRON HCL 4 MG/2ML IJ SOLN: 4.0000 mg | Freq: Four times a day (QID) | INTRAMUSCULAR | Status: DC | PRN

## 2023-09-05 NOTE — Plan of Care (Signed)
  Problem: Education: Goal: Ability to describe self-care measures that may prevent or decrease complications (Diabetes Survival Skills Education) will improve Outcome: Progressing Goal: Individualized Educational Video(s) Outcome: Progressing   

## 2023-09-05 NOTE — Progress Notes (Signed)
Physical Therapy Treatment Patient Details Name: Amanda Stewart MRN: [id number] DOB: 1941/05/29 Today's Date: 09/05/2023   History of Present Illness Patient is a 82 year old female who presents from home with multiple falls and generalized weakness. Family noted left facial droop and slurred speech. History of pacemaker. Recent hospital admission with orthostasis and falls at home.    PT Comments  Pt received in supine and agreeable to session. Pt demonstrates limited verbalizations during session, but is able to verbalize with cues and follows 1 step commands consistently. Pt able to complete bed mobility with CGA, but requires increased time to adjust to position changes due to headache and dizziness. Pt requires mod A for STS and min A for step pivot due to posterior bias and weakness. Pt reports excitement to sit in the chair and is motivated to improve functional mobility. Pt continues to benefit from PT services to progress toward functional mobility goals.    If plan is discharge home, recommend the following: Two people to help with walking and/or transfers;A lot of help with bathing/dressing/bathroom;Supervision due to cognitive status;Help with stairs or ramp for entrance;Assistance with cooking/housework   Can travel by private vehicle     Yes  Equipment Recommendations  Other (comment) (TBD next venue)    Recommendations for Other Services       Precautions / Restrictions Precautions Precautions: Fall Precaution Comments: 8 falls in the last several weeks Restrictions Weight Bearing Restrictions: No     Mobility  Bed Mobility Overal bed mobility: Needs Assistance Bed Mobility: Supine to Sit     Supine to sit: Contact guard, HOB elevated, Used rails     General bed mobility comments: increased time, but no physical assist needed    Transfers Overall transfer level: Needs assistance Equipment used: Rolling walker (2 wheels) Transfers: Sit to/from Stand, Bed to  chair/wheelchair/BSC Sit to Stand: Mod assist   Step pivot transfers: Min assist       General transfer comment: STS from EOB with mod A for power up and anterior weight shift due to posterior bias. Pt able to step pivot to recliner with min A and demonstrating very small steps with cues for direction and RW management. Assist with line management throughout.        Balance Overall balance assessment: Needs assistance Sitting-balance support: Feet supported Sitting balance-Leahy Scale: Fair Sitting balance - Comments: sitting EOB   Standing balance support: Reliant on assistive device for balance, Bilateral upper extremity supported, During functional activity Standing balance-Leahy Scale: Poor Standing balance comment: with RW support and min A                            Cognition Arousal: Alert Behavior During Therapy: WFL for tasks assessed/performed Overall Cognitive Status: Impaired/Different from baseline                                 General Comments: limited verbalizations during session, but follows all 1 step commands        Exercises      General Comments General comments (skin integrity, edema, etc.): Pt reporting dizziness throughout session      Pertinent Vitals/Pain Pain Assessment Pain Assessment: 0-10 Pain Score: 5  Pain Location: headache Pain Descriptors / Indicators: Aching Pain Intervention(s): Monitored during session     PT Goals (current goals can now be found in the care plan  section) Acute Rehab PT Goals Patient Stated Goal: to avoid falls PT Goal Formulation: With patient Time For Goal Achievement: 09/06/23 Progress towards PT goals: Progressing toward goals    Frequency    Min 1X/week       AM-PAC PT "6 Clicks" Mobility   Outcome Measure  Help needed turning from your back to your side while in a flat bed without using bedrails?: A Little Help needed moving from lying on your back to sitting on  the side of a flat bed without using bedrails?: A Little Help needed moving to and from a bed to a chair (including a wheelchair)?: A Lot Help needed standing up from a chair using your arms (e.g., wheelchair or bedside chair)?: A Lot Help needed to walk in hospital room?: Total Help needed climbing 3-5 steps with a railing? : Total 6 Click Score: 12    End of Session Equipment Utilized During Treatment: Gait belt Activity Tolerance: Patient tolerated treatment well;Patient limited by fatigue Patient left: in chair;with call bell/phone within reach;with nursing/sitter in room Nurse Communication: Mobility status PT Visit Diagnosis: Muscle weakness (generalized) (M62.81);Difficulty in walking, not elsewhere classified (R26.2);Repeated falls (R29.6)     Time: 4010-2725 PT Time Calculation (min) (ACUTE ONLY): 21 min  Charges:    $Therapeutic Activity: 8-22 mins PT General Charges $$ ACUTE PT VISIT: 1 Visit                    Johny Shock, PTA Acute Rehabilitation Services Secure Chat Preferred  [phone]    Johny Shock 09/05/2023, 12:52 PM

## 2023-09-05 NOTE — Progress Notes (Addendum)
  Progress Note    09/05/2023 8:51 AM 1 Day Post-Op  Subjective:  still has a headache, unchanged from prior to surgery    Vitals:   09/05/23 0407 09/05/23 0808  BP: (!) 152/72 (!) 142/46  Pulse: 73 (!) 59  Resp: 17 18  Temp: 98.4 F (36.9 C) 97.6 F (36.4 C)  SpO2: 99% 97%    Physical Exam: General:  sitting up in bed comfortably Lungs:  nonlabored Incisions:  right sided neck incision intact with minimal blood oozing, new dry tegaderm dressing placed. Incision is soft without hematoma Neuro: no slurred speech, intact grip strength and sensation   CBC    Component Value Date/Time   WBC 11.4 (H) 09/05/2023 0736   RBC 3.80 (L) 09/05/2023 0736   HGB 11.8 (L) 09/05/2023 0736   HCT 37.2 09/05/2023 0736   PLT 180 09/05/2023 0736   MCV 97.9 09/05/2023 0736   MCH 31.1 09/05/2023 0736   MCHC 31.7 09/05/2023 0736   RDW 14.8 09/05/2023 0736   LYMPHSABS 1.4 08/29/2023 1630   MONOABS 0.5 08/29/2023 1630   EOSABS 0.0 08/29/2023 1630   BASOSABS 0.1 08/29/2023 1630    BMET    Component Value Date/Time   NA 138 09/05/2023 0736   K 4.5 09/05/2023 0736   CL 100 09/05/2023 0736   CO2 24 09/05/2023 0736   GLUCOSE 195 (H) 09/05/2023 0736   BUN 29 (H) 09/05/2023 0736   CREATININE 1.21 (H) 09/05/2023 0736   CREATININE 1.25 (H) 12/22/2022 0000   CALCIUM 9.1 09/05/2023 0736   GFRNONAA 44 (L) 09/05/2023 0736   GFRNONAA 59 (L) 01/05/2021 0948   GFRAA 69 01/05/2021 0948    INR    Component Value Date/Time   INR 0.9 08/29/2023 1630     Intake/Output Summary (Last 24 hours) at 09/05/2023 0851 Last data filed at 09/04/2023 1655 Gross per 24 hour  Intake 1150 ml  Output 225 ml  Net 925 ml      Assessment/Plan:  82 y.o. female is 1 day post op, s/p: R TCAR with lithotripsy    -No issues overnight. Still reports a headache she has had since admission. This has not changed in severity -Denies any issues with weakness or numbness overnight. Has equal grip strength. No  slurred speech -R sided neck incision with some bloody drainage but no hematoma. New dry dressing was placed -Continue ASA, plavix, and statin -Will try to eat breakfast this morning to see if she is tolerating a normal diet without swallowing issues -Okay to mobilize   Loel Dubonnet, PA-C [facility] [phone] 09/05/2023 8:51 AM  VASCULAR STAFF ADDENDUM: I have independently interviewed and examined the patient. I agree with the above.  No issues overnight, headache not severe and similar to preop.  Neuro intact Continue dual antiplatelet and statin, Okay for discharge from vascular perspective Will plan for 1 month follow-up with carotid duplex  Daria Pastures MD [facility] Phone Number: [phone] 09/05/2023 11:31 AM

## 2023-09-05 NOTE — Discharge Instructions (Addendum)
   [facility]  Discharge Instructions   Carotid Surgery  Please refer to the following instructions for your post-procedure care. Your surgeon or physician assistant will discuss any changes with you.  Activity  You are encouraged to walk as much as you can. You can slowly return to normal activities but must avoid strenuous activity and heavy lifting until your doctor tell you it's okay. Avoid activities such as vacuuming or swinging a golf club. You can drive after one week if you are comfortable and you are no longer taking prescription pain medications. It is normal to feel tired for serval weeks after your surgery. It is also normal to have difficulty with sleep habits, eating, and bowel movements after surgery. These will go away with time.  Bathing/Showering  Shower daily after you go home. Do not soak in a bathtub, hot tub, or swim until the incision heals completely.  Incision Care  Shower every day. Clean your incision with mild soap and water. Pat the area dry with a clean towel. You do not need a bandage unless otherwise instructed. Do not apply any ointments or creams to your incision.  You have steri strips on your right neck incision. Those can come off on Sunday 09/09/2023.   For Men Only: It's okay to shave around the incision but do not shave the incision itself for 2 weeks. It is common to have numbness under your chin that could last for several months.  Diet  Resume your normal diet. There are no special food restrictions following this procedure. A low fat/low cholesterol diet is recommended for all patients with vascular disease. In order to heal from your surgery, it is CRITICAL to get adequate nutrition. Your body requires vitamins, minerals, and protein. Vegetables are the best source of vitamins and minerals. Vegetables also provide the perfect balance of protein. Processed food has little nutritional value, so try to avoid  this.  Medications  Resume taking all of your medications unless your doctor or physician assistant tells you not to. If your incision is causing pain, you may take over-the- counter pain relievers such as acetaminophen (Tylenol). If you were prescribed a stronger pain medication, please be aware these medications can cause nausea and constipation. Prevent nausea by taking the medication with a snack or meal. Avoid constipation by drinking plenty of fluids and eating foods with a high amount of fiber, such as fruits, vegetables, and grains.  Do not take Tylenol if you are taking prescription pain medications.  Follow Up  Our office will schedule a follow up appointment 2-3 weeks following discharge.  Please call us immediately for any of the following conditions  Increased pain, redness, drainage (pus) from your incision site. Fever of 101 degrees or higher. If you should develop stroke (slurred speech, difficulty swallowing, weakness on one side of your body, loss of vision) you should call 911 and go to the nearest emergency room.  Reduce your risk of vascular disease:  Stop smoking. If you would like help call [facility] at 1-800-QUIT-NOW (980-088-5282) or  at 6475212504. Manage your cholesterol Maintain a desired weight Control your diabetes Keep your blood pressure down  If you have any questions, please call the office at [phone].

## 2023-09-05 NOTE — TOC Progression Note (Signed)
Transition of Care [facility]) - Progression Note    Patient Details  Name: MONET NORTH MRN: [id number] Date of Birth: Feb 26, 1941  Transition of Care [facility]) CM/SW Contact  Eduard Roux, [place] Phone Number: 09/05/2023, 8:49 AM  Clinical Narrative:     Received insurance authorization for SNF  # [id number] reference # [id number] 10/9-10/11.   TOC will continue to follow and assist with discharge planning.  Antony Blackbird, MSW, LCSW Clinical Social Worker    Expected Discharge Plan: Skilled Nursing Facility Barriers to Discharge: Continued Medical Work up, English as a second language teacher  Expected Discharge Plan and Services     Post Acute Care Choice: Skilled Nursing Facility Living arrangements for the past 2 months: Apartment                                       Social Determinants of Health (SDOH) Interventions SDOH Screenings   Food Insecurity: No Food Insecurity (09/04/2023)  Housing: Low Risk  (09/04/2023)  Transportation Needs: No Transportation Needs (09/04/2023)  Utilities: Not At Risk (09/04/2023)  Tobacco Use: Low Risk  (09/04/2023)    Readmission Risk Interventions     No data to display

## 2023-09-05 NOTE — Progress Notes (Signed)
HOSPITALIST ROUNDING NOTE Amanda Stewart UUV:[id number]  DOB: May 10, 1941  DOA: 08/29/2023  PCP: Amanda Contras, MD  09/05/2023,11:48 AM   LOS: 7 days      Code Status: Full From: Home  current Dispo: CIR     82 year old white female 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--prior NSTEMI Moderate to severe aortic valve stenosis previously PAD on aspirin and Pletal CKD 3 AA HTN HLD Recent weight with deconditioning Recent hospitalization 9/14 through 9/16 for orthostatic hypotension meds adjusted at the time-Coreg was changed to Lopressor  Re-presented to the hospital on 10/2 with left facial left-sided weakness also slurred speech and stroke workup initiatedIs 10/3 CTA head bulky calcific atherosclerosis R carotid bifurcation with angiographic string sign-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-neurology recommending aspirin Plavix Echocardiogram EF 65-70% 9/15 PPM interrogated without issue  Plan  Acute CVA secondary to bulky R ICA stenosis status post  R TCAR 10/8 + lithotripsy Still has a little bit of slurring of speech but expect that this will get better--cleared by vascular surgery for discharge from their perspective Continues Lipitor 80 with good blood pressure control with metoprolol 25 twice daily and other GDMT Will need aspirin 81 in addition to Plavix going forward Possible CIR candidate  Complete heart block with a sensed V paced rhythm Continue meds as above Pacemaker interrogation apparently was normal-PACs no A-fib  Prior NSTEMI HFpEF Seems compensated at this time Echo 9/15 EF 65-70%  Recent admission for orthostatic hypotension and falls in the setting of debility from COVID earlier in September 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 would start IV  fluid     DVT prophylaxis: Lovenox  Status is: Inpatient Remains inpatient appropriate because:   Requires further management but may be able to obtain this in the outpatient at rehab    Subjective: Awake coherent no distress looks well feels fair No chest pain she is slightly weaker on the left side, has some dysarthria  Objective + exam Vitals:   09/04/23 2345 09/05/23 0029 09/05/23 0407 09/05/23 0808  BP:  (!) 142/49 (!) 152/72 (!) 142/46  Pulse: 69 60 73 (!) 59  Resp: 15 18 17 18   Temp: 98 F (36.7 C) 98 F (36.7 C) 98.4 F (36.9 C) 97.6 F (36.4 C)  TempSrc:  Oral Oral Oral  SpO2: 96% 96% 99% 97%  Weight:      Height:       Filed Weights   08/29/23 1621 09/04/23 1258  Weight: 78.4 kg 78.4 kg    Examination:  Looks well sitting up in chair about to eat Chest clear no added sound no wheeze rales rhonchi Abdomen soft no rebound no guarding ROM intact S1-S2 no murmur on monitors paced rhythm with some PVCs   Data Reviewed: reviewed   CBC    Component Value Date/Time   WBC 11.4 (H) 09/05/2023 0736   RBC 3.80 (L) 09/05/2023 0736   HGB 11.8 (L) 09/05/2023 0736   HCT 37.2 09/05/2023 0736   PLT 180 09/05/2023 0736   MCV 97.9 09/05/2023 0736   MCH 31.1 09/05/2023 0736   MCHC 31.7 09/05/2023 0736   RDW 14.8 09/05/2023 0736   LYMPHSABS 1.4 08/29/2023 1630   MONOABS 0.5 08/29/2023 1630   EOSABS 0.0 08/29/2023 1630   BASOSABS 0.1 08/29/2023 1630  Latest Ref Rng & Units 09/05/2023    7:36 AM 09/04/2023    5:25 AM 09/03/2023    8:34 AM  CMP  Glucose 70 - 99 mg/dL 161  096  045   BUN 8 - 23 mg/dL 29  15  11    Creatinine 0.44 - 1.00 mg/dL 4.09  8.11  9.14   Sodium 135 - 145 mmol/L 138  137  138   Potassium 3.5 - 5.1 mmol/L 4.5  3.5  3.5   Chloride 98 - 111 mmol/L 100  100  102   CO2 22 - 32 mmol/L 24  26  27    Calcium 8.9 - 10.3 mg/dL 9.1  9.0  8.8      Scheduled Meds:  aspirin EC  81 mg Oral Daily   atorvastatin  80 mg Oral Daily    clopidogrel  75 mg Oral Daily   dorzolamide  1 drop Both Eyes BID   enoxaparin (LOVENOX) injection  40 mg Subcutaneous QHS   feeding supplement  237 mL Oral BID BM   insulin aspart  0-9 Units Subcutaneous Q4H   metoprolol tartrate  25 mg Oral BID   multivitamin with minerals  1 tablet Oral Daily   pantoprazole  40 mg Oral Q0600   sodium chloride flush  3 mL Intravenous Once   Continuous Infusions:  Time  45  [name], MD  [facility]

## 2023-09-06 ENCOUNTER — Encounter (HOSPITAL_COMMUNITY): Payer: Self-pay | Admitting: Vascular Surgery

## 2023-09-06 DIAGNOSIS — I63231 Cerebral infarction due to unspecified occlusion or stenosis of right carotid arteries: Secondary | ICD-10-CM | POA: Diagnosis not present

## 2023-09-06 LAB — BASIC METABOLIC PANEL
Anion gap: 12 (ref 5–15)
BUN: 37 mg/dL — ABNORMAL HIGH (ref 8–23)
CO2: 26 mmol/L (ref 22–32)
Calcium: 9 mg/dL (ref 8.9–10.3)
Chloride: 100 mmol/L (ref 98–111)
Creatinine, Ser: 1.14 mg/dL — ABNORMAL HIGH (ref 0.44–1.00)
GFR, Estimated: 47 mL/min — ABNORMAL LOW (ref >60)
Glucose, Bld: 127 mg/dL — ABNORMAL HIGH (ref 70–99)
Potassium: 3.9 mmol/L (ref 3.5–5.1)
Sodium: 138 mmol/L (ref 135–145)

## 2023-09-06 LAB — GLUCOSE, CAPILLARY
Glucose-Capillary: 129 mg/dL — ABNORMAL HIGH (ref 70–99)
Glucose-Capillary: 126 mg/dL — ABNORMAL HIGH (ref 70–99)
Glucose-Capillary: 229 mg/dL — ABNORMAL HIGH (ref 70–99)

## 2023-09-06 MED ORDER — HYDRALAZINE HCL 20 MG/ML IJ SOLN: 10.0000 mg | Freq: Four times a day (QID) | INTRAMUSCULAR | Status: DC | PRN

## 2023-09-06 MED ORDER — ORAL CARE MOUTH RINSE: 15.0000 mL | OROMUCOSAL | Status: DC | PRN

## 2023-09-06 MED ORDER — SODIUM CHLORIDE 0.9 % IV SOLN: INTRAVENOUS | Status: AC

## 2023-09-06 NOTE — Progress Notes (Signed)
Mobility Specialist Progress Note:   09/06/23 1119  Mobility  Activity Transferred from bed to chair  Level of Assistance Minimal assist, patient does 75% or more  Assistive Device Front wheel walker  Distance Ambulated (ft) 3 ft  Mobility Referral Yes  $Mobility charge 1 Mobility  Mobility Specialist Start Time (ACUTE ONLY) 1030  Mobility Specialist Stop Time (ACUTE ONLY) 1041  Mobility Specialist Time Calculation (min) (ACUTE ONLY) 11 min   Pre Mobility: 130/89 BP  Post Mobility: 82 HR   Pt received in bed, agreeable to transfer to chair. Pt displayed heavy posterior backwards lean during transition requiring MinA to correct. Pt denied any discomfort during transition, asymptomatic throughout. Pt left in chair with family and RN present.   Leory Plowman  Mobility Specialist Please contact via [employer] office at [phone]

## 2023-09-06 NOTE — Progress Notes (Signed)
Lying 140/62 pulse 60   Sitting 133/58 pulse 83 Standing 0 min 140/61 pulse 95 Standing 3 min 154/82 pulse 72

## 2023-09-06 NOTE — TOC Progression Note (Signed)
Transition of Care [facility]) - Progression Note    Patient Details  Name: Amanda Stewart MRN: [id number] Date of Birth: 1941/07/18  Transition of Care [facility]) CM/SW Contact  Eduard Roux, [place] Phone Number: 09/06/2023, 11:29 AM  Clinical Narrative:     CSW spoke with patient's daughter, the plan remains for the patient to d/c to [facility] once stable. Patient's daughter inquired about assistance with POA paperwork, CSW explained, TOC is unable to assist with POA. She states understanding.  Patient not stable - updated SNF, anticipated d/c tomorrow   Antony Blackbird, MSW, LCSW Clinical Social Worker    Expected Discharge Plan: Skilled Nursing Facility Barriers to Discharge: Continued Medical Work up, English as a second language teacher  Expected Discharge Plan and Services     Post Acute Care Choice: Skilled Nursing Facility Living arrangements for the past 2 months: Apartment                                       Social Determinants of Health (SDOH) Interventions SDOH Screenings   Food Insecurity: No Food Insecurity (09/04/2023)  Housing: Low Risk  (09/04/2023)  Transportation Needs: No Transportation Needs (09/04/2023)  Utilities: Not At Risk (09/04/2023)  Tobacco Use: Low Risk  (09/04/2023)    Readmission Risk Interventions     No data to display

## 2023-09-06 NOTE — Progress Notes (Signed)
Occupational Therapy Treatment Patient Details Name: Amanda Stewart MRN: [id number] DOB: 1940-12-22 Today's Date: 09/06/2023   History of present illness Patient is a 82 year old female who presents from home with multiple falls and generalized weakness. Family noted left facial droop and slurred speech. History of pacemaker. Recent hospital admission with orthostasis and falls at home.   OT comments  OT session focused on ADLs and training in functional transfers with pt participating well and demonstrating good progress toward goals. Pt currently demonstrates ability to complete UB ADLs with Set up to Contact guard assist, LB ADLs with Contact guard assist to Mod assist, and functional step-pivot transfers with a RW with Contact guard to Min assist. Pt will benefit from continued skilled OT services to address deficits outlined below and increase safety and independence with ADLs and functional transfers/mobility. Post acute discharge, pt will benefit from intensive inpatient skilled rehab services < 3 hours per day to maximize rehab potential.       If plan is discharge home, recommend the following:  Two people to help with walking and/or transfers;Assistance with cooking/housework;Direct supervision/assist for medications management;Direct supervision/assist for financial management;Assist for transportation;Help with stairs or ramp for entrance;Assistance with feeding;A lot of help with bathing/dressing/bathroom (Set up for self feeding)   Equipment Recommendations  Other (comment) (defer to next level of care)    Recommendations for Other Services      Precautions / Restrictions Precautions Precautions: Fall Precaution Comments: 8 falls in the last several weeks Restrictions Weight Bearing Restrictions: No       Mobility Bed Mobility Overal bed mobility: Needs Assistance Bed Mobility: Sit to Supine       Sit to supine: Contact guard assist, Used rails (cues for technique)         Transfers Overall transfer level: Needs assistance Equipment used: Rolling walker (2 wheels) Transfers: Sit to/from Stand, Bed to chair/wheelchair/BSC Sit to Stand: Contact guard assist, Min assist (from recliner)     Step pivot transfers: Contact guard assist, From elevated surface     General transfer comment: Min assist to power up from lower surface; CGA from elevated surface     Balance Overall balance assessment: Needs assistance Sitting-balance support: Single extremity supported, No upper extremity supported, Feet supported Sitting balance-Leahy Scale: Fair (Fair+/Good-) Sitting balance - Comments: sitting EOB   Standing balance support: Single extremity supported, Bilateral upper extremity supported, During functional activity, Reliant on assistive device for balance Standing balance-Leahy Scale: Poor Standing balance comment: with use of RW and CGA to maintain balance                           ADL either performed or assessed with clinical judgement   ADL Overall ADL's : Needs assistance/impaired Eating/Feeding: Set up;Sitting   Grooming: Set up;Sitting;Wash/dry hands;Wash/dry face;Oral care   Upper Body Bathing: Contact guard assist;Sitting   Lower Body Bathing: Contact guard assist;Moderate assistance;Sitting/lateral leans Lower Body Bathing Details (indicate cue type and reason): Mod assist for peri-care, otherwise CGA Upper Body Dressing : Contact guard assist;Sitting   Lower Body Dressing: Contact guard assist;Sit to/from stand   Toilet Transfer: Contact guard assist;Rolling walker (2 wheels);BSC/3in1 (step-pivot transfer)   Toileting- Clothing Manipulation and Hygiene: Contact guard assist;Moderate assistance;Sit to/from stand Toileting - Clothing Manipulation Details (indicate cue type and reason): Mod assist for peri-care, otherwise CGA       General ADL Comments: Pt fatiguing quickly during tasks.    Extremity/Trunk Assessment  Upper  Extremity Assessment Upper Extremity Assessment: Right hand dominant;Generalized weakness RUE Deficits / Details: AROM grossly WFL for functional tasks LUE Deficits / Details: mild decreased grip strength compared to right, mildly decreased ability to sequence fine motor movements, now states that sensation is the same as R side LUE Sensation: WNL LUE Coordination: decreased fine motor;decreased gross motor (mild)   Lower Extremity Assessment Lower Extremity Assessment: Defer to PT evaluation        Vision       Perception     Praxis      Cognition Arousal: Alert Behavior During Therapy: WFL for tasks assessed/performed Overall Cognitive Status: Impaired/Different from baseline Area of Impairment: Following commands, Safety/judgement, Awareness, Problem solving, Memory                   Current Attention Level: Sustained Memory: Decreased short-term memory Following Commands: Follows one step commands consistently, Follows multi-step commands inconsistently, Follows multi-step commands with increased time Safety/Judgement: Decreased awareness of deficits, Decreased awareness of safety Awareness: Emergent Problem Solving: Slow processing, Decreased initiation, Requires verbal cues General Comments: Pleasant throughout session and participated well.        Exercises      Shoulder Instructions       General Comments VSS on RA throughout session. RN present during a portion of session    Pertinent Vitals/ Pain       Pain Assessment Pain Assessment: No/denies pain Pain Intervention(s): Monitored during session  Home Living                                          Prior Functioning/Environment              Frequency  Min 1X/week        Progress Toward Goals  OT Goals(current goals can now be found in the care plan section)  Progress towards OT goals: Progressing toward goals  Acute Rehab OT Goals Patient Stated Goal: Pt did  not state  Plan      Co-evaluation                 AM-PAC OT "6 Clicks" Daily Activity     Outcome Measure   Help from another person eating meals?: A Little Help from another person taking care of personal grooming?: A Little Help from another person toileting, which includes using toliet, bedpan, or urinal?: A Lot Help from another person bathing (including washing, rinsing, drying)?: A Lot Help from another person to put on and taking off regular upper body clothing?: A Little Help from another person to put on and taking off regular lower body clothing?: A Little 6 Click Score: 16    End of Session Equipment Utilized During Treatment: Gait belt;Rolling walker (2 wheels)  OT Visit Diagnosis: Unsteadiness on feet (R26.81);Other abnormalities of gait and mobility (R26.89);Muscle weakness (generalized) (M62.81);History of falling (Z91.81);Repeated falls (R29.6)   Activity Tolerance Patient tolerated treatment well   Patient Left in bed;with call bell/phone within reach;with bed alarm set   Nurse Communication Mobility status        Time: 6578-4696 OT Time Calculation (min): 36 min  Charges: OT General Charges $OT Visit: 1 Visit OT Treatments $Self Care/Home Management : 23-37 mins  Rachid Parham "Orson Eva., OTR/L, MA Acute Rehab [phone]   Lendon Colonel 09/06/2023, 2:45 PM

## 2023-09-06 NOTE — Progress Notes (Addendum)
HOSPITALIST ROUNDING NOTE Amanda Stewart HYQ:[id number]  DOB: [birth year]  DOA: 08/29/2023  PCP: Fleet Contras, MD  09/06/2023,8:37 AM   LOS: 8 days      Code Status: Full From: Home  current Dispo: CIR     82 year old white female 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--prior NSTEMI Moderate to severe aortic valve stenosis previously PAD on aspirin and Pletal CKD 3 AA HTN HLD Recent weight with deconditioning Recent hospitalization 9/14 through 9/16 for orthostatic hypotension meds adjusted at the time-Coreg was changed to Lopressor  Re-presented to the hospital on 10/2 with left facial left-sided weakness also slurred speech and stroke workup initiatedIs 10/3 CTA head bulky calcific atherosclerosis R carotid bifurcation with angiographic string sign-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-neurology recommending aspirin Plavix Echocardiogram EF 65-70% 9/15 PPM interrogated without issue  Plan  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 09/04/2023 Slurring of speech is better vascular surgeon has cleared her for discharge-Will need Plavix at least for a month preferably ongoing with aspirin 81 Lipitor 80 mg daily for secondary prevention  Prior admissions with falls and orthostasis in the setting of debility/COVID earlier 07/2023 Orthostatics are grossly negative-we will adjust blood pressure medications going forward She will need continued therapy in the outpatient setting and will require skilled care given her history of falls  Complete heart block with a sensed V paced rhythm Continues on metoprolol 25 twice daily alone-has as needed blood pressure medications for systolic >180 Pacemaker interrogation apparently was normal-PACs no A-fib  Prior NSTEMI HFpEF Seems compensated at this time Echo 9/15 EF 65-70%  CKD 3B Hypokalemia Hypokalemia is resolved  More azotemic today-BUN/creatinine is increased despite forcing oral  fluids Start saline at 40 cc/H for 24 hours  Updated family today  DVT prophylaxis: Lovenox  Status is: Inpatient Remains inpatient appropriate because:      Subjective:  Dysarthria seems improved She does not really have any focal deficits today to my exam She is sitting up asking for breakfast She has no pain Wound site seems improved  Objective + exam Vitals:   09/06/23 0000 09/06/23 0402 09/06/23 0746 09/06/23 0800  BP: (!) 132/58 (!) 159/58 136/67 135/81  Pulse: 63 62 74 73  Resp: 17 18 17    Temp: 97.6 F (36.4 C) 98.3 F (36.8 C) 98.2 F (36.8 C)   TempSrc: Oral Oral Oral   SpO2: 98% 97% 98% 96%  Weight:      Height:       Filed Weights   08/29/23 1621 09/04/23 1258  Weight: 78.4 kg 78.4 kg    Examination:  Looks well, right neck wound seems dry Raising limbs equally bilaterally although subjectively stills feels weak she has 5/5 power Reflexes deferred Smile is almost symmetric although she has some drooping on the left side Abdomen is soft no rebound Chest is clear S1-S2 no murmur she seems to be in a paced rhythm without any arrhythmia  Data Reviewed: reviewed   CBC    Component Value Date/Time   WBC 11.4 (H) 09/05/2023 0736   RBC 3.80 (L) 09/05/2023 0736   HGB 11.8 (L) 09/05/2023 0736   HCT 37.2 09/05/2023 0736   PLT 180 09/05/2023 0736   MCV 97.9 09/05/2023 0736   MCH 31.1 09/05/2023 0736   MCHC 31.7 09/05/2023 0736   RDW 14.8 09/05/2023 0736   LYMPHSABS 1.4 08/29/2023 1630   MONOABS 0.5 08/29/2023 1630   EOSABS 0.0  08/29/2023 1630   BASOSABS 0.1 08/29/2023 1630      Latest Ref Rng & Units 09/06/2023    3:54 AM 09/05/2023    7:36 AM 09/04/2023    5:25 AM  CMP  Glucose 70 - 99 mg/dL 629  528  413   BUN 8 - 23 mg/dL 37  29  15   Creatinine 0.44 - 1.00 mg/dL 2.44  0.10  2.72   Sodium 135 - 145 mmol/L 138  138  137   Potassium 3.5 - 5.1 mmol/L 3.9  4.5  3.5   Chloride 98 - 111 mmol/L 100  100  100   CO2 22 - 32 mmol/L 26  24  26     Calcium 8.9 - 10.3 mg/dL 9.0  9.1  9.0      Scheduled Meds:  aspirin EC  81 mg Oral Daily   atorvastatin  80 mg Oral Daily   clopidogrel  75 mg Oral Daily   dorzolamide  1 drop Both Eyes BID   enoxaparin (LOVENOX) injection  40 mg Subcutaneous QHS   feeding supplement  237 mL Oral BID BM   insulin aspart  0-9 Units Subcutaneous Q4H   metoprolol tartrate  25 mg Oral BID   multivitamin with minerals  1 tablet Oral Daily   pantoprazole  40 mg Oral Q0600   Continuous Infusions:  sodium chloride      Time 25  [name], MD  [facility]

## 2023-09-06 NOTE — Progress Notes (Addendum)
  Progress Note    09/06/2023 8:00 AM 2 Days Post-Op  Subjective:  doing well this morning    Vitals:   09/06/23 0402 09/06/23 0746  BP: (!) 159/58 136/67  Pulse: 62 74  Resp: 18 17  Temp: 98.3 F (36.8 C) 98.2 F (36.8 C)  SpO2: 97% 98%    Physical Exam: General:  sitting up in bed, alert and oriented x4 Cardiac:  regular Lungs:  nonlabored Incisions:  right sided neck incision dry and intact without hematoma Extremities:  left hand grip strength 4/5. Right hand grip 5/5   CBC    Component Value Date/Time   WBC 11.4 (H) 09/05/2023 0736   RBC 3.80 (L) 09/05/2023 0736   HGB 11.8 (L) 09/05/2023 0736   HCT 37.2 09/05/2023 0736   PLT 180 09/05/2023 0736   MCV 97.9 09/05/2023 0736   MCH 31.1 09/05/2023 0736   MCHC 31.7 09/05/2023 0736   RDW 14.8 09/05/2023 0736   LYMPHSABS 1.4 08/29/2023 1630   MONOABS 0.5 08/29/2023 1630   EOSABS 0.0 08/29/2023 1630   BASOSABS 0.1 08/29/2023 1630    BMET    Component Value Date/Time   NA 138 09/06/2023 0354   K 3.9 09/06/2023 0354   CL 100 09/06/2023 0354   CO2 26 09/06/2023 0354   GLUCOSE 127 (H) 09/06/2023 0354   BUN 37 (H) 09/06/2023 0354   CREATININE 1.14 (H) 09/06/2023 0354   CREATININE 1.25 (H) 12/22/2022 0000   CALCIUM 9.0 09/06/2023 0354   GFRNONAA 47 (L) 09/06/2023 0354   GFRNONAA 59 (L) 01/05/2021 0948   GFRAA 69 01/05/2021 0948    INR    Component Value Date/Time   INR 0.9 08/29/2023 1630     Intake/Output Summary (Last 24 hours) at 09/06/2023 0800 Last data filed at 09/06/2023 0402 Gross per 24 hour  Intake 120 ml  Output 1050 ml  Net -930 ml      Assessment/Plan:  82 y.o. female is 2 days post op, s/p: R TCAR with lithotripsy   -Right sided neck incision is dry and intact. No further bloody drainage. No hematoma on exam -No further neurological events. Still has some residual left hand weakness and dysarthria  -Continue ASA and plavix for stent patency. She will follow up with our office  in 1 month with carotid duplex   Loel Dubonnet PA-C [facility] [phone] 09/06/2023 8:00 AM  VASCULAR STAFF ADDENDUM: I have independently interviewed and examined the patient. I agree with the above.  Will need plavix for 30 days on discharge Ok to take steri strips off on Sunday (POD5). I will remove if still admitted at that time.   Daria Pastures MD [facility] Phone Number: [phone] 09/06/2023 8:15 AM

## 2023-09-07 DIAGNOSIS — I63231 Cerebral infarction due to unspecified occlusion or stenosis of right carotid arteries: Secondary | ICD-10-CM | POA: Diagnosis not present

## 2023-09-07 LAB — BASIC METABOLIC PANEL
Anion gap: 8 (ref 5–15)
BUN: 22 mg/dL (ref 8–23)
CO2: 27 mmol/L (ref 22–32)
Calcium: 8.6 mg/dL — ABNORMAL LOW (ref 8.9–10.3)
Chloride: 106 mmol/L (ref 98–111)
Creatinine, Ser: 1.09 mg/dL — ABNORMAL HIGH (ref 0.44–1.00)
GFR, Estimated: 50 mL/min — ABNORMAL LOW (ref >60)
Glucose, Bld: 192 mg/dL — ABNORMAL HIGH (ref 70–99)
Potassium: 4 mmol/L (ref 3.5–5.1)
Sodium: 141 mmol/L (ref 135–145)

## 2023-09-07 LAB — GLUCOSE, CAPILLARY
Glucose-Capillary: 134 mg/dL — ABNORMAL HIGH (ref 70–99)
Glucose-Capillary: 151 mg/dL — ABNORMAL HIGH (ref 70–99)
Glucose-Capillary: 152 mg/dL — ABNORMAL HIGH (ref 70–99)
Glucose-Capillary: 166 mg/dL — ABNORMAL HIGH (ref 70–99)
Glucose-Capillary: 224 mg/dL — ABNORMAL HIGH (ref 70–99)

## 2023-09-07 MED ORDER — ATORVASTATIN CALCIUM 80 MG PO TABS: 80.0000 mg | ORAL_TABLET | Freq: Every day | ORAL | 3 refills | Status: DC

## 2023-09-07 MED ORDER — OXYCODONE-ACETAMINOPHEN 5-325 MG PO TABS: 1.0000 | ORAL_TABLET | ORAL | 0 refills | Status: DC | PRN

## 2023-09-07 MED ORDER — CLOPIDOGREL BISULFATE 75 MG PO TABS: 75.0000 mg | ORAL_TABLET | Freq: Every day | ORAL | 3 refills | Status: DC

## 2023-09-07 NOTE — Progress Notes (Signed)
Physical Therapy Treatment Patient Details Name: Amanda Stewart MRN: [id number] DOB: 08/18/1941 Today's Date: 09/07/2023   History of Present Illness Patient is a 82 year old female who presents from home with multiple falls and generalized weakness. Family noted left facial droop and slurred speech. History of pacemaker. Recent hospital admission with orthostasis and falls at home.    PT Comments  Pt received in supine and agreeable to session. Pt able to complete mobility tasks with up to min A and tolerate gait trial this session. Pt demonstrates improved stability with RW support and is able to improve gait kinematics with cues. Pt continues to be limited by fatigue, however is motivated to improve functional mobility. Pt continues to benefit from PT services to progress toward functional mobility goals.     If plan is discharge home, recommend the following: Two people to help with walking and/or transfers;A lot of help with bathing/dressing/bathroom;Supervision due to cognitive status;Help with stairs or ramp for entrance;Assistance with cooking/housework   Can travel by private vehicle     Yes  Equipment Recommendations  Other (comment) (TBD next venue)    Recommendations for Other Services       Precautions / Restrictions Precautions Precautions: Fall Precaution Comments: 8 falls in the last several weeks Restrictions Weight Bearing Restrictions: No     Mobility  Bed Mobility Overal bed mobility: Needs Assistance Bed Mobility: Supine to Sit     Supine to sit: Contact guard, HOB elevated, Used rails     General bed mobility comments: increased time, but no physical assist needed    Transfers Overall transfer level: Needs assistance Equipment used: Rolling walker (2 wheels) Transfers: Sit to/from Stand Sit to Stand: Min assist, Contact guard assist           General transfer comment: Initially min A progressing to CGA. cues for hand placement     Ambulation/Gait Ambulation/Gait assistance: Contact guard assist Gait Distance (Feet): 60 Feet Assistive device: Rolling walker (2 wheels) Gait Pattern/deviations: Step-through pattern, Decreased stride length, Trunk flexed       General Gait Details: Slow, short steps with pt slightly increasing step length with cues       Balance Overall balance assessment: Needs assistance Sitting-balance support: Single extremity supported, No upper extremity supported, Feet supported Sitting balance-Leahy Scale: Fair Sitting balance - Comments: sitting EOB   Standing balance support: Bilateral upper extremity supported, During functional activity, Reliant on assistive device for balance Standing balance-Leahy Scale: Poor Standing balance comment: with RW support                            Cognition Arousal: Alert Behavior During Therapy: WFL for tasks assessed/performed Overall Cognitive Status: Impaired/Different from baseline                                          Exercises      General Comments        Pertinent Vitals/Pain Pain Assessment Pain Assessment: No/denies pain     PT Goals (current goals can now be found in the care plan section) Acute Rehab PT Goals Patient Stated Goal: to avoid falls PT Goal Formulation: With patient Time For Goal Achievement: 09/06/23 Progress towards PT goals: Progressing toward goals    Frequency    Min 1X/week       AM-PAC PT "6  Clicks" Mobility   Outcome Measure  Help needed turning from your back to your side while in a flat bed without using bedrails?: A Little Help needed moving from lying on your back to sitting on the side of a flat bed without using bedrails?: A Little Help needed moving to and from a bed to a chair (including a wheelchair)?: A Little Help needed standing up from a chair using your arms (e.g., wheelchair or bedside chair)?: A Little Help needed to walk in hospital room?:  A Little Help needed climbing 3-5 steps with a railing? : Total 6 Click Score: 16    End of Session Equipment Utilized During Treatment: Gait belt Activity Tolerance: Patient tolerated treatment well Patient left: in chair;with call bell/phone within reach Nurse Communication: Mobility status PT Visit Diagnosis: Muscle weakness (generalized) (M62.81);Difficulty in walking, not elsewhere classified (R26.2);Repeated falls (R29.6)     Time: 1120-1140 PT Time Calculation (min) (ACUTE ONLY): 20 min  Charges:    $Gait Training: 8-22 mins PT General Charges $$ ACUTE PT VISIT: 1 Visit                     Johny Shock, PTA Acute Rehabilitation Services Secure Chat Preferred  [phone]    Johny Shock 09/07/2023, 12:47 PM

## 2023-09-07 NOTE — Progress Notes (Signed)
Called report to [place], Charity fundraiser at [facility].   Lawson Radar, RN

## 2023-09-07 NOTE — Discharge Summary (Signed)
and C6-7 causing some encroachment upon the central canal. Uncovertebral and facet joint spurring causes some encroachment upon the neural foramina bilaterally. Upper chest: Lung apices are clear. Other: None. IMPRESSION: 1. No acute intracranial abnormalities. Chronic atrophy and small vessel ischemic changes. Small right parafalcine meningioma. 2. Normal alignment of the cervical spine. No acute displaced fractures identified. Diffuse degenerative change. Electronically Signed   By: Burman Nieves M.D.   On:  08/29/2023 19:10   CT CERVICAL SPINE WO CONTRAST  Result Date: 08/29/2023 CLINICAL DATA:  Minor head trauma.  Dizziness.  Poly trauma, blunt. EXAM: CT HEAD WITHOUT CONTRAST CT CERVICAL SPINE WITHOUT CONTRAST TECHNIQUE: Multidetector CT imaging of the head and cervical spine was performed following the standard protocol without intravenous contrast. Multiplanar CT image reconstructions of the cervical spine were also generated. RADIATION DOSE REDUCTION: This exam was performed according to the departmental dose-optimization program which includes automated exposure control, adjustment of the mA and/or kV according to patient size and/or use of iterative reconstruction technique. COMPARISON:  CT head 05/30/2014 FINDINGS: CT HEAD FINDINGS Brain: Diffuse cerebral atrophy. Mild ventricular dilatation consistent with central atrophy. Low-attenuation changes in the deep white matter consistent with small vessel ischemia. Old calcified extra-axial lesion along the anterior falx to the right measuring 0.8 x 1.9 cm. This is likely a meningioma. Mild enlargement since previous study. No mass-effect or midline shift. No abnormal extra-axial fluid collections. Gray-white matter junctions are distinct. Basal cisterns are not effaced. No acute intracranial hemorrhage. Vascular: No hyperdense vessel or unexpected calcification. Skull: Normal. Negative for fracture or focal lesion. Sinuses/Orbits: No acute finding. Other: None. CT CERVICAL SPINE FINDINGS Alignment: Normal alignment. Skull base and vertebrae: No acute fracture. No primary bone lesion or focal pathologic process. Soft tissues and spinal canal: No prevertebral fluid or swelling. No visible canal hematoma. Disc levels: Degenerative changes throughout with narrowed disc space interspaces and associated endplate osteophyte formation. Moderately prominent disc osteophyte complexes at C2-3, C4-5, and C6-7 causing some encroachment upon the central canal. Uncovertebral  and facet joint spurring causes some encroachment upon the neural foramina bilaterally. Upper chest: Lung apices are clear. Other: None. IMPRESSION: 1. No acute intracranial abnormalities. Chronic atrophy and small vessel ischemic changes. Small right parafalcine meningioma. 2. Normal alignment of the cervical spine. No acute displaced fractures identified. Diffuse degenerative change. Electronically Signed   By: Burman Nieves M.D.   On: 08/29/2023 19:10   ECHOCARDIOGRAM COMPLETE  Result Date: 08/12/2023    ECHOCARDIOGRAM REPORT   Patient Name:   Amanda Stewart Date of Exam: 08/12/2023 Medical Rec #:  [id number]   Height:       65.0 in Accession #:    [id number]  Weight:       172.6 lb Date of Birth:  Feb 26, 1941   BSA:          1.858 m Patient Age:    82 years    BP:           126/68 mmHg Patient Gender: F           HR:           82 bpm. Exam Location:  Inpatient Procedure: 2D Echo, Cardiac Doppler, Color Doppler and Intracardiac            Opacification Agent Indications:    Murmur  History:        Patient has prior history of Echocardiogram examinations, most  and C6-7 causing some encroachment upon the central canal. Uncovertebral and facet joint spurring causes some encroachment upon the neural foramina bilaterally. Upper chest: Lung apices are clear. Other: None. IMPRESSION: 1. No acute intracranial abnormalities. Chronic atrophy and small vessel ischemic changes. Small right parafalcine meningioma. 2. Normal alignment of the cervical spine. No acute displaced fractures identified. Diffuse degenerative change. Electronically Signed   By: Burman Nieves M.D.   On:  08/29/2023 19:10   CT CERVICAL SPINE WO CONTRAST  Result Date: 08/29/2023 CLINICAL DATA:  Minor head trauma.  Dizziness.  Poly trauma, blunt. EXAM: CT HEAD WITHOUT CONTRAST CT CERVICAL SPINE WITHOUT CONTRAST TECHNIQUE: Multidetector CT imaging of the head and cervical spine was performed following the standard protocol without intravenous contrast. Multiplanar CT image reconstructions of the cervical spine were also generated. RADIATION DOSE REDUCTION: This exam was performed according to the departmental dose-optimization program which includes automated exposure control, adjustment of the mA and/or kV according to patient size and/or use of iterative reconstruction technique. COMPARISON:  CT head 05/30/2014 FINDINGS: CT HEAD FINDINGS Brain: Diffuse cerebral atrophy. Mild ventricular dilatation consistent with central atrophy. Low-attenuation changes in the deep white matter consistent with small vessel ischemia. Old calcified extra-axial lesion along the anterior falx to the right measuring 0.8 x 1.9 cm. This is likely a meningioma. Mild enlargement since previous study. No mass-effect or midline shift. No abnormal extra-axial fluid collections. Gray-white matter junctions are distinct. Basal cisterns are not effaced. No acute intracranial hemorrhage. Vascular: No hyperdense vessel or unexpected calcification. Skull: Normal. Negative for fracture or focal lesion. Sinuses/Orbits: No acute finding. Other: None. CT CERVICAL SPINE FINDINGS Alignment: Normal alignment. Skull base and vertebrae: No acute fracture. No primary bone lesion or focal pathologic process. Soft tissues and spinal canal: No prevertebral fluid or swelling. No visible canal hematoma. Disc levels: Degenerative changes throughout with narrowed disc space interspaces and associated endplate osteophyte formation. Moderately prominent disc osteophyte complexes at C2-3, C4-5, and C6-7 causing some encroachment upon the central canal. Uncovertebral  and facet joint spurring causes some encroachment upon the neural foramina bilaterally. Upper chest: Lung apices are clear. Other: None. IMPRESSION: 1. No acute intracranial abnormalities. Chronic atrophy and small vessel ischemic changes. Small right parafalcine meningioma. 2. Normal alignment of the cervical spine. No acute displaced fractures identified. Diffuse degenerative change. Electronically Signed   By: Burman Nieves M.D.   On: 08/29/2023 19:10   ECHOCARDIOGRAM COMPLETE  Result Date: 08/12/2023    ECHOCARDIOGRAM REPORT   Patient Name:   Amanda Stewart Date of Exam: 08/12/2023 Medical Rec #:  [id number]   Height:       65.0 in Accession #:    [id number]  Weight:       172.6 lb Date of Birth:  Feb 26, 1941   BSA:          1.858 m Patient Age:    82 years    BP:           126/68 mmHg Patient Gender: F           HR:           82 bpm. Exam Location:  Inpatient Procedure: 2D Echo, Cardiac Doppler, Color Doppler and Intracardiac            Opacification Agent Indications:    Murmur  History:        Patient has prior history of Echocardiogram examinations, most  and C6-7 causing some encroachment upon the central canal. Uncovertebral and facet joint spurring causes some encroachment upon the neural foramina bilaterally. Upper chest: Lung apices are clear. Other: None. IMPRESSION: 1. No acute intracranial abnormalities. Chronic atrophy and small vessel ischemic changes. Small right parafalcine meningioma. 2. Normal alignment of the cervical spine. No acute displaced fractures identified. Diffuse degenerative change. Electronically Signed   By: Burman Nieves M.D.   On:  08/29/2023 19:10   CT CERVICAL SPINE WO CONTRAST  Result Date: 08/29/2023 CLINICAL DATA:  Minor head trauma.  Dizziness.  Poly trauma, blunt. EXAM: CT HEAD WITHOUT CONTRAST CT CERVICAL SPINE WITHOUT CONTRAST TECHNIQUE: Multidetector CT imaging of the head and cervical spine was performed following the standard protocol without intravenous contrast. Multiplanar CT image reconstructions of the cervical spine were also generated. RADIATION DOSE REDUCTION: This exam was performed according to the departmental dose-optimization program which includes automated exposure control, adjustment of the mA and/or kV according to patient size and/or use of iterative reconstruction technique. COMPARISON:  CT head 05/30/2014 FINDINGS: CT HEAD FINDINGS Brain: Diffuse cerebral atrophy. Mild ventricular dilatation consistent with central atrophy. Low-attenuation changes in the deep white matter consistent with small vessel ischemia. Old calcified extra-axial lesion along the anterior falx to the right measuring 0.8 x 1.9 cm. This is likely a meningioma. Mild enlargement since previous study. No mass-effect or midline shift. No abnormal extra-axial fluid collections. Gray-white matter junctions are distinct. Basal cisterns are not effaced. No acute intracranial hemorrhage. Vascular: No hyperdense vessel or unexpected calcification. Skull: Normal. Negative for fracture or focal lesion. Sinuses/Orbits: No acute finding. Other: None. CT CERVICAL SPINE FINDINGS Alignment: Normal alignment. Skull base and vertebrae: No acute fracture. No primary bone lesion or focal pathologic process. Soft tissues and spinal canal: No prevertebral fluid or swelling. No visible canal hematoma. Disc levels: Degenerative changes throughout with narrowed disc space interspaces and associated endplate osteophyte formation. Moderately prominent disc osteophyte complexes at C2-3, C4-5, and C6-7 causing some encroachment upon the central canal. Uncovertebral  and facet joint spurring causes some encroachment upon the neural foramina bilaterally. Upper chest: Lung apices are clear. Other: None. IMPRESSION: 1. No acute intracranial abnormalities. Chronic atrophy and small vessel ischemic changes. Small right parafalcine meningioma. 2. Normal alignment of the cervical spine. No acute displaced fractures identified. Diffuse degenerative change. Electronically Signed   By: Burman Nieves M.D.   On: 08/29/2023 19:10   ECHOCARDIOGRAM COMPLETE  Result Date: 08/12/2023    ECHOCARDIOGRAM REPORT   Patient Name:   Amanda Stewart Date of Exam: 08/12/2023 Medical Rec #:  [id number]   Height:       65.0 in Accession #:    [id number]  Weight:       172.6 lb Date of Birth:  Feb 26, 1941   BSA:          1.858 m Patient Age:    82 years    BP:           126/68 mmHg Patient Gender: F           HR:           82 bpm. Exam Location:  Inpatient Procedure: 2D Echo, Cardiac Doppler, Color Doppler and Intracardiac            Opacification Agent Indications:    Murmur  History:        Patient has prior history of Echocardiogram examinations, most  and C6-7 causing some encroachment upon the central canal. Uncovertebral and facet joint spurring causes some encroachment upon the neural foramina bilaterally. Upper chest: Lung apices are clear. Other: None. IMPRESSION: 1. No acute intracranial abnormalities. Chronic atrophy and small vessel ischemic changes. Small right parafalcine meningioma. 2. Normal alignment of the cervical spine. No acute displaced fractures identified. Diffuse degenerative change. Electronically Signed   By: Burman Nieves M.D.   On:  08/29/2023 19:10   CT CERVICAL SPINE WO CONTRAST  Result Date: 08/29/2023 CLINICAL DATA:  Minor head trauma.  Dizziness.  Poly trauma, blunt. EXAM: CT HEAD WITHOUT CONTRAST CT CERVICAL SPINE WITHOUT CONTRAST TECHNIQUE: Multidetector CT imaging of the head and cervical spine was performed following the standard protocol without intravenous contrast. Multiplanar CT image reconstructions of the cervical spine were also generated. RADIATION DOSE REDUCTION: This exam was performed according to the departmental dose-optimization program which includes automated exposure control, adjustment of the mA and/or kV according to patient size and/or use of iterative reconstruction technique. COMPARISON:  CT head 05/30/2014 FINDINGS: CT HEAD FINDINGS Brain: Diffuse cerebral atrophy. Mild ventricular dilatation consistent with central atrophy. Low-attenuation changes in the deep white matter consistent with small vessel ischemia. Old calcified extra-axial lesion along the anterior falx to the right measuring 0.8 x 1.9 cm. This is likely a meningioma. Mild enlargement since previous study. No mass-effect or midline shift. No abnormal extra-axial fluid collections. Gray-white matter junctions are distinct. Basal cisterns are not effaced. No acute intracranial hemorrhage. Vascular: No hyperdense vessel or unexpected calcification. Skull: Normal. Negative for fracture or focal lesion. Sinuses/Orbits: No acute finding. Other: None. CT CERVICAL SPINE FINDINGS Alignment: Normal alignment. Skull base and vertebrae: No acute fracture. No primary bone lesion or focal pathologic process. Soft tissues and spinal canal: No prevertebral fluid or swelling. No visible canal hematoma. Disc levels: Degenerative changes throughout with narrowed disc space interspaces and associated endplate osteophyte formation. Moderately prominent disc osteophyte complexes at C2-3, C4-5, and C6-7 causing some encroachment upon the central canal. Uncovertebral  and facet joint spurring causes some encroachment upon the neural foramina bilaterally. Upper chest: Lung apices are clear. Other: None. IMPRESSION: 1. No acute intracranial abnormalities. Chronic atrophy and small vessel ischemic changes. Small right parafalcine meningioma. 2. Normal alignment of the cervical spine. No acute displaced fractures identified. Diffuse degenerative change. Electronically Signed   By: Burman Nieves M.D.   On: 08/29/2023 19:10   ECHOCARDIOGRAM COMPLETE  Result Date: 08/12/2023    ECHOCARDIOGRAM REPORT   Patient Name:   Amanda Stewart Date of Exam: 08/12/2023 Medical Rec #:  [id number]   Height:       65.0 in Accession #:    [id number]  Weight:       172.6 lb Date of Birth:  Feb 26, 1941   BSA:          1.858 m Patient Age:    82 years    BP:           126/68 mmHg Patient Gender: F           HR:           82 bpm. Exam Location:  Inpatient Procedure: 2D Echo, Cardiac Doppler, Color Doppler and Intracardiac            Opacification Agent Indications:    Murmur  History:        Patient has prior history of Echocardiogram examinations, most  and C6-7 causing some encroachment upon the central canal. Uncovertebral and facet joint spurring causes some encroachment upon the neural foramina bilaterally. Upper chest: Lung apices are clear. Other: None. IMPRESSION: 1. No acute intracranial abnormalities. Chronic atrophy and small vessel ischemic changes. Small right parafalcine meningioma. 2. Normal alignment of the cervical spine. No acute displaced fractures identified. Diffuse degenerative change. Electronically Signed   By: Burman Nieves M.D.   On:  08/29/2023 19:10   CT CERVICAL SPINE WO CONTRAST  Result Date: 08/29/2023 CLINICAL DATA:  Minor head trauma.  Dizziness.  Poly trauma, blunt. EXAM: CT HEAD WITHOUT CONTRAST CT CERVICAL SPINE WITHOUT CONTRAST TECHNIQUE: Multidetector CT imaging of the head and cervical spine was performed following the standard protocol without intravenous contrast. Multiplanar CT image reconstructions of the cervical spine were also generated. RADIATION DOSE REDUCTION: This exam was performed according to the departmental dose-optimization program which includes automated exposure control, adjustment of the mA and/or kV according to patient size and/or use of iterative reconstruction technique. COMPARISON:  CT head 05/30/2014 FINDINGS: CT HEAD FINDINGS Brain: Diffuse cerebral atrophy. Mild ventricular dilatation consistent with central atrophy. Low-attenuation changes in the deep white matter consistent with small vessel ischemia. Old calcified extra-axial lesion along the anterior falx to the right measuring 0.8 x 1.9 cm. This is likely a meningioma. Mild enlargement since previous study. No mass-effect or midline shift. No abnormal extra-axial fluid collections. Gray-white matter junctions are distinct. Basal cisterns are not effaced. No acute intracranial hemorrhage. Vascular: No hyperdense vessel or unexpected calcification. Skull: Normal. Negative for fracture or focal lesion. Sinuses/Orbits: No acute finding. Other: None. CT CERVICAL SPINE FINDINGS Alignment: Normal alignment. Skull base and vertebrae: No acute fracture. No primary bone lesion or focal pathologic process. Soft tissues and spinal canal: No prevertebral fluid or swelling. No visible canal hematoma. Disc levels: Degenerative changes throughout with narrowed disc space interspaces and associated endplate osteophyte formation. Moderately prominent disc osteophyte complexes at C2-3, C4-5, and C6-7 causing some encroachment upon the central canal. Uncovertebral  and facet joint spurring causes some encroachment upon the neural foramina bilaterally. Upper chest: Lung apices are clear. Other: None. IMPRESSION: 1. No acute intracranial abnormalities. Chronic atrophy and small vessel ischemic changes. Small right parafalcine meningioma. 2. Normal alignment of the cervical spine. No acute displaced fractures identified. Diffuse degenerative change. Electronically Signed   By: Burman Nieves M.D.   On: 08/29/2023 19:10   ECHOCARDIOGRAM COMPLETE  Result Date: 08/12/2023    ECHOCARDIOGRAM REPORT   Patient Name:   Amanda Stewart Date of Exam: 08/12/2023 Medical Rec #:  [id number]   Height:       65.0 in Accession #:    [id number]  Weight:       172.6 lb Date of Birth:  Feb 26, 1941   BSA:          1.858 m Patient Age:    82 years    BP:           126/68 mmHg Patient Gender: F           HR:           82 bpm. Exam Location:  Inpatient Procedure: 2D Echo, Cardiac Doppler, Color Doppler and Intracardiac            Opacification Agent Indications:    Murmur  History:        Patient has prior history of Echocardiogram examinations, most  Physician Discharge Summary  JASARA CORRIGAN ZOX:[id number] DOB: [birth year] DOA: 08/29/2023  PCP: Fleet Contras, MD  Admit date: 08/29/2023 Discharge date: 09/07/2023  Time spent: 45 minutes  Recommendations for Outpatient Follow-up:  Get Chem-12 CBC in about 1 week Repeat lipid panel in about 2 months Outpatient follow-up with vascular as per them Add back blood pressure agents in the outpatient setting depending on kidney function-  Discharge Diagnoses:  MAIN problem for hospitalization   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 Volume depletion now better Complete heart block history Prior NSTEMI HFpEF DM TY 2 well-controlled on orals CKD 3B with hypokalemia  Please see below for itemized issues addressed in HOpsital- refer to other progress notes for clarity if needed  Discharge Condition: Improved  Diet recommendation: Heart healthy  Filed Weights   08/29/23 1621 09/04/23 1258  Weight: 78.4 kg 78.4 kg    History of present illness:  82 year old  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--prior NSTEMI Moderate to severe aortic valve stenosis previously PAD on aspirin and Pletal CKD 3 AA HTN HLD Multiple recent falls at home Recent hospitalization 9/14 through 9/16 for orthostatic hypotension meds adjusted at the time-Coreg was changed to Lopressor   Re-presented to the hospital on 10/2 with left facial left-sided weakness also slurred speech and stroke workup initiated 10/3 CTA head bulky calcific atherosclerosis R carotid bifurcation with angiographic string sign-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-neurology recommending aspirin Plavix Echocardiogram EF 65-70% 9/15 PPM interrogated without issue    Hospital Course:  Acute CVA resulting from acute R ICA stenosis with R TCAR 09/04/2023 Slurring of speech is better vascular surgeon has cleared her for discharge-Will need Plavix at least for a month preferably ongoing with  aspirin 81 Lipitor 80 mg daily for secondary prevention Repeat lipid panel in the outpatient setting-outpatient appointment with vascular to be set up  by them She has been prescribed a low-dose of opiates at discharge for pain she may not need them please use your discretion only use Tylenol if she does not require at this time  Prior admissions with falls and orthostasis in the setting of debility/COVID earlier 07/2023 Orthostatics neg this admit  but she needs continued therapy in the outpatient setting and will require skilled care given her history of falls   Complete heart block with a sensed V paced rhythm Moderate aortic stenosis Continues on metoprolol 25 twice daily alone-relatively well-controlled during hospital stay Pacemaker interrogation apparently was normal-PACs no A-fib   Prior NSTEMI HFpEF Seems compensated at this time Echo 9/15 EF 65-70%   Diabetes mellitus type II well-controlled-PTA Jardiance 25 glimepiride with breakfast 1 mg reordered at discharge  Hypertension I discontinued her losartan given some azotemia she can continue her amlodipine 5 as well as metoprolol 25 twice daily She can possibly resume hydralazine in the outpatient setting if needed  CKD 3B Hypokalemia Hypokalemia is resolved  Had some transient azotemia and was given IV fluids-creatinine resolved well hypokalemia is resolved she will need labs in a week at the outpatient setting   Discharge Exam: Vitals:   09/07/23 0306 09/07/23 0805  BP: (!) 156/69 133/67  Pulse: 73 68  Resp: 15 17  Temp: 98 F (36.7 C) (!) 97.3 F (36.3 C)  SpO2: 97%     Subj on day of d/c   Looks well feels fair no distress  General Exam on discharge   NCAT no focal deficits icterus no pallor no  Physician Discharge Summary  JASARA CORRIGAN ZOX:[id number] DOB: [birth year] DOA: 08/29/2023  PCP: Fleet Contras, MD  Admit date: 08/29/2023 Discharge date: 09/07/2023  Time spent: 45 minutes  Recommendations for Outpatient Follow-up:  Get Chem-12 CBC in about 1 week Repeat lipid panel in about 2 months Outpatient follow-up with vascular as per them Add back blood pressure agents in the outpatient setting depending on kidney function-  Discharge Diagnoses:  MAIN problem for hospitalization   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 Volume depletion now better Complete heart block history Prior NSTEMI HFpEF DM TY 2 well-controlled on orals CKD 3B with hypokalemia  Please see below for itemized issues addressed in HOpsital- refer to other progress notes for clarity if needed  Discharge Condition: Improved  Diet recommendation: Heart healthy  Filed Weights   08/29/23 1621 09/04/23 1258  Weight: 78.4 kg 78.4 kg    History of present illness:  82 year old  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--prior NSTEMI Moderate to severe aortic valve stenosis previously PAD on aspirin and Pletal CKD 3 AA HTN HLD Multiple recent falls at home Recent hospitalization 9/14 through 9/16 for orthostatic hypotension meds adjusted at the time-Coreg was changed to Lopressor   Re-presented to the hospital on 10/2 with left facial left-sided weakness also slurred speech and stroke workup initiated 10/3 CTA head bulky calcific atherosclerosis R carotid bifurcation with angiographic string sign-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-neurology recommending aspirin Plavix Echocardiogram EF 65-70% 9/15 PPM interrogated without issue    Hospital Course:  Acute CVA resulting from acute R ICA stenosis with R TCAR 09/04/2023 Slurring of speech is better vascular surgeon has cleared her for discharge-Will need Plavix at least for a month preferably ongoing with  aspirin 81 Lipitor 80 mg daily for secondary prevention Repeat lipid panel in the outpatient setting-outpatient appointment with vascular to be set up  by them She has been prescribed a low-dose of opiates at discharge for pain she may not need them please use your discretion only use Tylenol if she does not require at this time  Prior admissions with falls and orthostasis in the setting of debility/COVID earlier 07/2023 Orthostatics neg this admit  but she needs continued therapy in the outpatient setting and will require skilled care given her history of falls   Complete heart block with a sensed V paced rhythm Moderate aortic stenosis Continues on metoprolol 25 twice daily alone-relatively well-controlled during hospital stay Pacemaker interrogation apparently was normal-PACs no A-fib   Prior NSTEMI HFpEF Seems compensated at this time Echo 9/15 EF 65-70%   Diabetes mellitus type II well-controlled-PTA Jardiance 25 glimepiride with breakfast 1 mg reordered at discharge  Hypertension I discontinued her losartan given some azotemia she can continue her amlodipine 5 as well as metoprolol 25 twice daily She can possibly resume hydralazine in the outpatient setting if needed  CKD 3B Hypokalemia Hypokalemia is resolved  Had some transient azotemia and was given IV fluids-creatinine resolved well hypokalemia is resolved she will need labs in a week at the outpatient setting   Discharge Exam: Vitals:   09/07/23 0306 09/07/23 0805  BP: (!) 156/69 133/67  Pulse: 73 68  Resp: 15 17  Temp: 98 F (36.7 C) (!) 97.3 F (36.3 C)  SpO2: 97%     Subj on day of d/c   Looks well feels fair no distress  General Exam on discharge   NCAT no focal deficits icterus no pallor no  and C6-7 causing some encroachment upon the central canal. Uncovertebral and facet joint spurring causes some encroachment upon the neural foramina bilaterally. Upper chest: Lung apices are clear. Other: None. IMPRESSION: 1. No acute intracranial abnormalities. Chronic atrophy and small vessel ischemic changes. Small right parafalcine meningioma. 2. Normal alignment of the cervical spine. No acute displaced fractures identified. Diffuse degenerative change. Electronically Signed   By: Burman Nieves M.D.   On:  08/29/2023 19:10   CT CERVICAL SPINE WO CONTRAST  Result Date: 08/29/2023 CLINICAL DATA:  Minor head trauma.  Dizziness.  Poly trauma, blunt. EXAM: CT HEAD WITHOUT CONTRAST CT CERVICAL SPINE WITHOUT CONTRAST TECHNIQUE: Multidetector CT imaging of the head and cervical spine was performed following the standard protocol without intravenous contrast. Multiplanar CT image reconstructions of the cervical spine were also generated. RADIATION DOSE REDUCTION: This exam was performed according to the departmental dose-optimization program which includes automated exposure control, adjustment of the mA and/or kV according to patient size and/or use of iterative reconstruction technique. COMPARISON:  CT head 05/30/2014 FINDINGS: CT HEAD FINDINGS Brain: Diffuse cerebral atrophy. Mild ventricular dilatation consistent with central atrophy. Low-attenuation changes in the deep white matter consistent with small vessel ischemia. Old calcified extra-axial lesion along the anterior falx to the right measuring 0.8 x 1.9 cm. This is likely a meningioma. Mild enlargement since previous study. No mass-effect or midline shift. No abnormal extra-axial fluid collections. Gray-white matter junctions are distinct. Basal cisterns are not effaced. No acute intracranial hemorrhage. Vascular: No hyperdense vessel or unexpected calcification. Skull: Normal. Negative for fracture or focal lesion. Sinuses/Orbits: No acute finding. Other: None. CT CERVICAL SPINE FINDINGS Alignment: Normal alignment. Skull base and vertebrae: No acute fracture. No primary bone lesion or focal pathologic process. Soft tissues and spinal canal: No prevertebral fluid or swelling. No visible canal hematoma. Disc levels: Degenerative changes throughout with narrowed disc space interspaces and associated endplate osteophyte formation. Moderately prominent disc osteophyte complexes at C2-3, C4-5, and C6-7 causing some encroachment upon the central canal. Uncovertebral  and facet joint spurring causes some encroachment upon the neural foramina bilaterally. Upper chest: Lung apices are clear. Other: None. IMPRESSION: 1. No acute intracranial abnormalities. Chronic atrophy and small vessel ischemic changes. Small right parafalcine meningioma. 2. Normal alignment of the cervical spine. No acute displaced fractures identified. Diffuse degenerative change. Electronically Signed   By: Burman Nieves M.D.   On: 08/29/2023 19:10   ECHOCARDIOGRAM COMPLETE  Result Date: 08/12/2023    ECHOCARDIOGRAM REPORT   Patient Name:   Amanda Stewart Date of Exam: 08/12/2023 Medical Rec #:  [id number]   Height:       65.0 in Accession #:    [id number]  Weight:       172.6 lb Date of Birth:  Feb 26, 1941   BSA:          1.858 m Patient Age:    82 years    BP:           126/68 mmHg Patient Gender: F           HR:           82 bpm. Exam Location:  Inpatient Procedure: 2D Echo, Cardiac Doppler, Color Doppler and Intracardiac            Opacification Agent Indications:    Murmur  History:        Patient has prior history of Echocardiogram examinations, most

## 2023-09-07 NOTE — Plan of Care (Signed)
  Problem: Coping: Goal: Ability to adjust to condition or change in health will improve Outcome: Progressing   Problem: Fluid Volume: Goal: Ability to maintain a balanced intake and output will improve Outcome: Progressing   Problem: Health Behavior/Discharge Planning: Goal: Ability to identify and utilize available resources and services will improve Outcome: Progressing   

## 2023-09-07 NOTE — TOC Transition Note (Signed)
Transition of Care [facility]) - CM/SW Discharge Note   Patient Details  Name: Amanda Stewart MRN: [id number] Date of Birth: Jul 27, 1941  Transition of Care [facility]) CM/SW Contact:  Eduard Roux, LCSW Phone Number: 09/07/2023, 2:17 PM   Clinical Narrative:     Patient will Discharge to: [facility] Discharge Date: 09/07/2023 Family Notified: daughter Transport By: Sharin Mons  Per MD patient is ready for discharge. RN, patient, and facility notified of discharge. Discharge Summary sent to facility. RN given number for [phone]. Ambulance transport requested for patient.   Clinical Social Worker signing off.  Antony Blackbird, MSW, LCSW Clinical Social Worker     Final next level of care: Skilled Nursing Facility Barriers to Discharge: Continued Medical Work up, English as a second language teacher   Patient Goals and CMS Choice CMS Medicare.gov Compare Post Acute Care list provided to:: Patient Choice offered to / list presented to : Patient, Adult Children  Discharge Placement                         Discharge Plan and Services Additional resources added to the After Visit Summary for       Post Acute Care Choice: Skilled Nursing Facility                               Social Determinants of Health (SDOH) Interventions SDOH Screenings   Food Insecurity: No Food Insecurity (09/04/2023)  Housing: Low Risk  (09/04/2023)  Transportation Needs: No Transportation Needs (09/04/2023)  Utilities: Not At Risk (09/04/2023)  Tobacco Use: Low Risk  (09/04/2023)     Readmission Risk Interventions     No data to display

## 2023-09-28 ENCOUNTER — Other Ambulatory Visit: Payer: Self-pay | Admitting: *Deleted

## 2023-09-28 DIAGNOSIS — I6529 Occlusion and stenosis of unspecified carotid artery: Secondary | ICD-10-CM

## 2023-10-03 ENCOUNTER — Encounter: Payer: Self-pay | Admitting: Adult Health

## 2023-10-03 ENCOUNTER — Ambulatory Visit (INDEPENDENT_AMBULATORY_CARE_PROVIDER_SITE_OTHER): Payer: 59 | Admitting: Adult Health

## 2023-10-03 ENCOUNTER — Telehealth: Payer: Self-pay

## 2023-10-03 VITALS — BP 152/74 | HR 96 | Ht 64.0 in | Wt 158.0 lb

## 2023-10-03 DIAGNOSIS — E1169 Type 2 diabetes mellitus with other specified complication: Secondary | ICD-10-CM | POA: Diagnosis not present

## 2023-10-03 DIAGNOSIS — E785 Hyperlipidemia, unspecified: Secondary | ICD-10-CM

## 2023-10-03 DIAGNOSIS — I4891 Unspecified atrial fibrillation: Secondary | ICD-10-CM | POA: Diagnosis not present

## 2023-10-03 DIAGNOSIS — Z794 Long term (current) use of insulin: Secondary | ICD-10-CM

## 2023-10-03 DIAGNOSIS — E119 Type 2 diabetes mellitus without complications: Secondary | ICD-10-CM

## 2023-10-03 DIAGNOSIS — I6389 Other cerebral infarction: Secondary | ICD-10-CM

## 2023-10-03 MED ORDER — APIXABAN 5 MG PO TABS: 5.0000 mg | ORAL_TABLET | Freq: Two times a day (BID) | ORAL | 3 refills | Status: DC

## 2023-10-03 NOTE — Telephone Encounter (Addendum)
Device alert for exceed AF, in progress from 11/4, overall controlled rates at present but does experience periods of elevated rates episodes.  No recent hx of PAF noted in EPIC, ASA only Hx of CVA.  [phone] Lush this is her daughter Journalist, newspaper).   Patient currently seeing neurology Renelda Loma, NP).  I spoke with Aundra Millet and patient through secure chat in EPIC.   Patient woke up very dizzy this morning, daughter says she has history of that.  Otherwise Asx.   I have set patient up with afib clinic on Friday, 10/05/2023 at 2:30pm with Ricky to evaluate for AF and rate control if needed.   Review with Dr. Ladona Ridgel as Dr. Lalla Brothers is out of the office:  should we go ahead and initiate OAC therapy?  Side note: she had a right TCAR in hospital and has FU Friday AM with vascular surgeon

## 2023-10-03 NOTE — Telephone Encounter (Signed)
Dr. Ladona Ridgel agrees with AF clinic appt: 11/8 :230 pm with Jorja Loa.  Also, okay to go ahead and start Eliquis 5mg  bid.  Working on clearance with [facility] as patient just had a TACR procedure on 10/8.and need clearance to initiate OAC.

## 2023-10-03 NOTE — Telephone Encounter (Signed)
Clearance received from Dr. Theadora Rama (on call for Dr. Hetty Blend [facility]).  Okay to move forward with Eliquis.    Spoke to daughter Sue Lush:  AF Clinic appt R. Fenton 11/8 at 2:30 Start Eliquis 5mg  bid (rx sent in [facility]). Send manual transmission for updated AF burden this pm.   Daughter aware she will call back when she gets to moms for assistance sending manual and to write down appt. Info.

## 2023-10-03 NOTE — Patient Instructions (Signed)
Your Plan:  Continue ASA and plavix See Cardiology as they will adjust blood thinner  due to new onset A.fib.   Blood pressure goal <130/90 Cholesterol LDL goal <70 Diabetes goal A1c <7 Monitor diet and try to exercise  Let me know if nerve related pain gets worse- make sure you let vascular surgeon know as well.   Thank you for coming to see [place] at [facility]. I hope we have been able to provide you high quality care today.  You may receive a patient satisfaction survey over the next few weeks. We would appreciate your feedback and comments so that we may continue to improve ourselves and the health of our patients.

## 2023-10-03 NOTE — Progress Notes (Signed)
Patient ID: Amanda Stewart, female   DOB: Jun 06, 1941, 82 y.o.   MRN: [id number]  Reason for Consult: Routine Post Op   Referred by Fleet Contras, MD  Subjective:     HPI  Amanda Stewart is a 82 y.o. female who status post right TCAR for symptomatic carotid stenosis.  She has been home and been living independently and has been doing well.  She denies any strokelike symptoms, one-sided weakness, numbness, amaurosis or speech issues.  She continues to be compliant with aspirin and Plavix  Past Medical History:  Diagnosis Date   Allergic rhinitis    Anemia    Arrhythmia    CAD (coronary artery disease)    Chest pain    Chronic kidney disease, stage 3a (HCC) 08/11/2023   Combined systolic and diastolic heart failure (HCC)    Diabetes mellitus without complication (HCC)    Diabetic peripheral neuropathy (HCC)    GERD (gastroesophageal reflux disease)    Heart block    Hyperlipidemia    Hypertension    Left bundle branch block    Low back pain    Myocardial infarction (HCC)    Peripheral arterial disease (HCC)    Retinopathy    Family History  Problem Relation Age of Onset   Diabetes Mother    Diabetes Father    Stroke Father    Past Surgical History:  Procedure Laterality Date   ABDOMINAL HYSTERECTOMY     LEFT AND RIGHT HEART CATHETERIZATION WITH CORONARY ANGIOGRAM N/A 05/26/2014   Procedure: LEFT AND RIGHT HEART CATHETERIZATION WITH CORONARY ANGIOGRAM;  Surgeon: [name], MD;  Location: [facility] CATH LAB;  Service: Cardiovascular;  Laterality: N/A;   PACEMAKER IMPLANT N/A 08/08/2021   Procedure: PACEMAKER IMPLANT;  Surgeon: Lanier Prude, MD;  Location: [facility] INVASIVE CV LAB;  Service: Cardiovascular;  Laterality: N/A;   TEMPORARY PACEMAKER N/A 08/07/2021   Procedure: TEMPORARY PACEMAKER;  Surgeon: Rinaldo Cloud, MD;  Location: [facility] INVASIVE CV LAB;  Service: Cardiovascular;  Laterality: N/A;   TRANSCAROTID ARTERY REVASCULARIZATION  Right 09/04/2023   Procedure: Transcarotid  Artery Revascularization;  Surgeon: Daria Pastures, MD;  Location: [facility];  Service: Vascular;  Laterality: Right;   ULTRASOUND GUIDANCE FOR VASCULAR ACCESS Left 09/04/2023   Procedure: ULTRASOUND GUIDANCE FOR VASCULAR ACCESS;  Surgeon: Daria Pastures, MD;  Location: [facility] OR;  Service: Vascular;  Laterality: Left;    Short Social History:  Social History   Tobacco Use   Smoking status: Never   Smokeless tobacco: Never  Substance Use Topics   Alcohol use: Not Currently    Alcohol/week: 1.0 standard drink of alcohol    Types: 1 Glasses of wine per week    Allergies  Allergen Reactions   Metformin And Related Other (See Comments)    Diarrhea     Current Outpatient Medications  Medication Sig Dispense Refill   acetaminophen (TYLENOL) 325 MG tablet Take 1-2 tablets (325-650 mg total) by mouth every 4 (four) hours as needed for mild pain. (Patient taking differently: Take 325 mg by mouth every 4 (four) hours as needed for mild pain (pain score 1-3).)     albuterol (VENTOLIN HFA) 108 (90 Base) MCG/ACT inhaler Inhale 2 puffs into the lungs every 6 (six) hours as needed for wheezing or shortness of breath.     amLODipine (NORVASC) 5 MG tablet Take 5 mg by mouth daily.     apixaban (ELIQUIS) 5 MG TABS tablet Take 1 tablet (5 mg total) by mouth  2 (two) times daily. 60 tablet 3   aspirin EC 81 MG tablet Take 81 mg by mouth daily.     atorvastatin (LIPITOR) 80 MG tablet Take 1 tablet (80 mg total) by mouth daily. 30 tablet 3   benzonatate (TESSALON) 100 MG capsule Take 100-200 mg by mouth every 8 (eight) hours as needed for cough.     clopidogrel (PLAVIX) 75 MG tablet Take 1 tablet (75 mg total) by mouth daily. 39 tablet 3   dorzolamide (TRUSOPT) 2 % ophthalmic solution Place 1 drop into both eyes 2 (two) times daily.     empagliflozin (JARDIANCE) 25 MG TABS tablet Take 25 mg by mouth daily.     glimepiride (AMARYL) 1 MG tablet Take 1 mg by mouth daily with breakfast.     losartan (COZAAR) 100  MG tablet Take 100 mg by mouth daily.     metoprolol tartrate (LOPRESSOR) 25 MG tablet Take 1 tablet (25 mg total) by mouth 2 (two) times daily. 60 tablet 3   Multiple Vitamin (MULTIVITAMIN WITH MINERALS) TABS tablet Take 1 tablet by mouth daily.     nitroGLYCERIN (NITROSTAT) 0.4 MG SL tablet Place 1 tablet (0.4 mg total) under the tongue every 5 (five) minutes x 3 doses as needed for chest pain. 25 tablet 1   pantoprazole (PROTONIX) 40 MG tablet Take 1 tablet (40 mg total) by mouth daily at 6 (six) AM. 30 tablet 3   potassium chloride (KLOR-CON) 10 MEQ tablet Take 10 mEq by mouth daily.     traMADol (ULTRAM) 50 MG tablet Take 50 mg by mouth 2 (two) times daily as needed.     No current facility-administered medications for this visit.    REVIEW OF SYSTEMS  Negative other than noted in HPI     Objective:  Objective   Vitals:   10/05/23 1128 10/05/23 1131  BP: 139/89 (!) 145/88  Pulse: 80   Resp: 20   Temp: 97.7 F (36.5 C)   SpO2: 97%   Weight: 156 lb (70.8 kg)   Height: 5\' 4"  (1.626 m)    Body mass index is 26.78 kg/m.  Physical Exam General: no acute distress Cardiac: hemodynamically stable, nontachycardic Pulm: normal work of breathing Neck: incision healing well at base of R neck Neuro: alert, no focal deficit Extremities: no edema, cyanosis or wounds Vascular:   Right: palpable radial  Left: palpable radial   Data: Carotid duplex: Right Carotid Findings:  +----------+--------+--------+--------+------------------+-------------+           PSV cm/sEDV cm/sStenosisPlaque DescriptionComments       +----------+--------+--------+--------+------------------+-------------+  CCA Prox  72      11                                               +----------+--------+--------+--------+------------------+-------------+  CCA Mid   50      11                                                +----------+--------+--------+--------+------------------+-------------+  CCA Distal  stent          +----------+--------+--------+--------+------------------+-------------+  ICA Prox  75      14                                stent outflow  +----------+--------+--------+--------+------------------+-------------+  ICA Mid   94      26                                               +----------+--------+--------+--------+------------------+-------------+  ICA Distal73      15                                               +----------+--------+--------+--------+------------------+-------------+  ECA      57      0                                                +----------+--------+--------+--------+------------------+-------------+   +----------+--------+-------+----------------+-------------------+           PSV cm/sEDV cmsDescribe        Arm Pressure (mmHG)  +----------+--------+-------+----------------+-------------------+  [id number]            Multiphasic, WNL                     +----------+--------+-------+----------------+-------------------+   +---------+--------+--+--------+-+---------+  VertebralPSV cm/s28EDV cm/s6Antegrade  +---------+--------+--+--------+-+---------+      Right Stent(s):  +---------------+--+--++++  Prox to Stent  4712  +---------------+--+--++++  Proximal Stent 4310  +---------------+--+--++++  Mid Stent      4110  +---------------+--+--++++  Distal Stent   5111  +---------------+--+--++++  Distal to [id number]  +---------------+--+--++++       Left Carotid Findings:  +----------+--------+--------+--------+-------------------------+--------+           PSV cm/sEDV cm/sStenosisPlaque Description       Comments  +----------+--------+--------+--------+-------------------------+--------+  CCA Prox  50      10                                                  +----------+--------+--------+--------+-------------------------+--------+  CCA Mid   90      22              heterogenous                       +----------+--------+--------+--------+-------------------------+--------+  CCA Distal77      16                                                 +----------+--------+--------+--------+-------------------------+--------+  ICA Prox  84      27      1-39%   heterogenous and calcific          +----------+--------+--------+--------+-------------------------+--------+  ICA Mid   82  22                                                 +----------+--------+--------+--------+-------------------------+--------+  ICA Distal87      28                                                 +----------+--------+--------+--------+-------------------------+--------+  ECA      136     10              heterogenous                       +----------+--------+--------+--------+-------------------------+--------+       Assessment/Plan:     Amanda Stewart is a 82 y.o. female with symptomatic carotid stenosis with is 59month s/p R TCAR with shockwave lithotripsy. She has recovered well and duplex demonstrates good patency without significant restenosis Continue DAPT with aspirin and plavix  Plan for follow up in 6 months with carotid duplex   Of note she was restarted on Eliquis for paroxysmal A-fib.  On exam she is not in A-fib today.  I explained that if Eliquis is going to be a chronic medication and she starts to develop bruising issues I would be comfortable with discontinuing Plavix at the 15-month mark in January.  Instructed the patient and daughter to call if she is having bruising at that time.   Recommendations to optimize cardiovascular risk: Abstinence from all tobacco products. Blood glucose control with goal A1c < 7%. Blood pressure control with goal blood pressure <  140/90 mmHg. Lipid reduction therapy with goal LDL-C <100 mg/dL  Aspirin 81mg  PO QD.  Atorvastatin 40-80mg  PO QD (or other "high intensity" statin therapy).     Daria Pastures MD [facility]

## 2023-10-03 NOTE — Progress Notes (Signed)
PATIENT: Amanda Stewart DOB: Jan 16, 1941  REASON FOR VISIT: follow up HISTORY FROM: patient PRIMARY NEUROLOGIST: Dr. Pearlean Brownie  Chief Complaint  Patient presents with   Follow-up    Pt in 19 with daughter  Pt states left side weaker,speech slurred,vision blurry,headaches      HISTORY OF PRESENT ILLNESS: Today 10/03/23  Amanda Stewart is a 82 y.o. female here for hospital follow-up after stroke. Discussed the use of AI scribe software for clinical note transcription with the patient, who gave verbal consent to proceed.  The patient, with a history of recent stroke, presents with residual deficits including slurred speech and impaired fine motor skills in the right hand. The patient is right-handed and reports being able to move the arm but struggles with tasks requiring fine motor skills.   The patient was discharged from the hospital on aspirin and Plavix.  Remains on Lipitor for her cholesterol.The patient's blood pressure was slightly elevated at the time of the consultation, and there is a history of diabetes, which was reportedly poorly controlled with a recent HbA1c of 10.1. However, the patient reports that a recent visit to the primary care physician showed an improvement in HbA1c to 8.5.  The patient has a pacemaker.  RN from her cardiology office reach out to me this morning during the visit to let me know that they saw an extended period of A-fib this a.m.  They will be reaching out to the patient this afternoon to discuss anticoagulation therapy.  We also schedule an appointment Friday with her cardiologist.  The patient is currently receiving physical and speech therapy at home following the stroke.  She also states that she occasionally has sharp pain at the site of TCAR.  She states that it only last approximately 10 to 15 minutes and then it resolves.  Maybe happens once a day.  She has a follow-up with her surgeon on Friday.  The patient lives at home and is accompanied to  medical appointments by a daughter.     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 Chronic atrophy and small vessel ischemic changes. Small right parafalcine meningioma. CTA head & neck Bulky calcific atherosclerosis at the right carotid bifurcation with an angiographic string sign of the proximal ICA. The ICA remains patent distally. 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 Moderate stenosis of the V2 segment at the C4 level. Non-dominant, diminutive right vertebral artery is poorly opacified and occluded at multiple locations along its course. No visible opacification of the V4 segment. MRI Mixed watershed and embolic infarcts in the right MCA territory involving the right frontal and parietal lobes. No hemorrhage. Echo complete: 08/12/2023 - EF 65 to 70% .  Pacermaker interrogation multiple sinus tachycardia with PACs and very short lasting a flutter, but no A-fib. LDL 68 HgbA1c 10.1 VTE prophylaxis - subcutaneous 40 mg aspirin 81 mg daily and cilostazol  prior to admission, now on ASA and plavix DAPT in preparation for TCAR.  Therapy recommendations:  CIR vs. SNF Disposition: pending  REVIEW OF SYSTEMS: Out of a complete 14 system review of symptoms, the patient complains only of the following symptoms, and all other reviewed systems are negative.  ALLERGIES: Allergies  Allergen Reactions   Metformin And Related Other (See Comments)    Diarrhea  HOME MEDICATIONS: Outpatient Medications Prior to Visit  Medication Sig Dispense Refill   acetaminophen (TYLENOL) 325 MG tablet Take 1-2 tablets (325-650 mg total) by mouth every 4 (four) hours as needed for mild pain. (Patient taking differently: Take 325 mg by mouth every 4 (four) hours as needed for mild pain.)     albuterol (VENTOLIN HFA) 108  (90 Base) MCG/ACT inhaler Inhale 2 puffs into the lungs every 6 (six) hours as needed for wheezing or shortness of breath.     amLODipine (NORVASC) 10 MG tablet Take 10 mg by mouth daily.     aspirin EC 81 MG tablet Take 81 mg by mouth daily.     atorvastatin (LIPITOR) 80 MG tablet Take 1 tablet (80 mg total) by mouth daily. 30 tablet 3   benzonatate (TESSALON) 100 MG capsule Take 100-200 mg by mouth every 8 (eight) hours as needed for cough.     clopidogrel (PLAVIX) 75 MG tablet Take 1 tablet (75 mg total) by mouth daily. 39 tablet 3   dorzolamide (TRUSOPT) 2 % ophthalmic solution Place 1 drop into both eyes 2 (two) times daily.     empagliflozin (JARDIANCE) 25 MG TABS tablet Take 25 mg by mouth daily.     glimepiride (AMARYL) 1 MG tablet Take 1 mg by mouth daily with breakfast.     metoprolol tartrate (LOPRESSOR) 25 MG tablet Take 1 tablet (25 mg total) by mouth 2 (two) times daily. 60 tablet 3   Multiple Vitamin (MULTIVITAMIN WITH MINERALS) TABS tablet Take 1 tablet by mouth daily.     nitroGLYCERIN (NITROSTAT) 0.4 MG SL tablet Place 1 tablet (0.4 mg total) under the tongue every 5 (five) minutes x 3 doses as needed for chest pain. 25 tablet 1   oxyCODONE-acetaminophen (PERCOCET/ROXICET) 5-325 MG tablet Take 1-2 tablets by mouth every 4 (four) hours as needed for moderate pain. 30 tablet 0   pantoprazole (PROTONIX) 40 MG tablet Take 1 tablet (40 mg total) by mouth daily at 6 (six) AM. 30 tablet 3   No facility-administered medications prior to visit.    PAST MEDICAL HISTORY: Past Medical History:  Diagnosis Date   Allergic rhinitis    Anemia    Arrhythmia    CAD (coronary artery disease)    Chest pain    Chronic kidney disease, stage 3a (HCC) 08/11/2023   Combined systolic and diastolic heart failure (HCC)    Diabetes mellitus without complication (HCC)    Diabetic peripheral neuropathy (HCC)    GERD (gastroesophageal reflux disease)    Heart block    Hyperlipidemia     Hypertension    Left bundle branch block    Low back pain    Myocardial infarction (HCC)    Peripheral arterial disease (HCC)    Retinopathy     PAST SURGICAL HISTORY: Past Surgical History:  Procedure Laterality Date   ABDOMINAL HYSTERECTOMY     LEFT AND RIGHT HEART CATHETERIZATION WITH CORONARY ANGIOGRAM N/A 05/26/2014   Procedure: LEFT AND RIGHT HEART CATHETERIZATION WITH CORONARY ANGIOGRAM;  Surgeon: [name], MD;  Location: [facility] CATH LAB;  Service: Cardiovascular;  Laterality: N/A;   PACEMAKER IMPLANT N/A 08/08/2021   Procedure: PACEMAKER IMPLANT;  Surgeon: Lanier Prude, MD;  Location: [facility] INVASIVE CV LAB;  Service: Cardiovascular;  Laterality: N/A;   TEMPORARY PACEMAKER N/A 08/07/2021   Procedure: TEMPORARY PACEMAKER;  Surgeon: Rinaldo Cloud, MD;  Location: [facility] INVASIVE CV LAB;  Service: Cardiovascular;  Laterality: N/A;   TRANSCAROTID ARTERY REVASCULARIZATION  Right 09/04/2023   Procedure: Transcarotid Artery Revascularization;  Surgeon: Daria Pastures, MD;  Location: [facility] OR;  Service: Vascular;  Laterality: Right;   ULTRASOUND GUIDANCE FOR VASCULAR ACCESS Left 09/04/2023   Procedure: ULTRASOUND GUIDANCE FOR VASCULAR ACCESS;  Surgeon: Daria Pastures, MD;  Location: [facility] OR;  Service: Vascular;  Laterality: Left;    FAMILY HISTORY: Family History  Problem Relation Age of Onset   Diabetes Mother    Diabetes Father    Stroke Father     SOCIAL HISTORY: Social History   Socioeconomic History   Marital status: Married    Spouse name: Not on file   Number of children: Not on file   Years of education: Not on file   Highest education level: Not on file  Occupational History   Not on file  Tobacco Use   Smoking status: Never   Smokeless tobacco: Never  Vaping Use   Vaping status: Never Used  Substance and Sexual Activity   Alcohol use: Yes    Alcohol/week: 1.0 standard drink of alcohol    Types: 1 Glasses of wine per week   Drug use: No   Sexual activity: Not  on file  Other Topics Concern   Not on file  Social History Narrative   Not on file   Social Determinants of Health   Financial Resource Strain: Not on file  Food Insecurity: No Food Insecurity (09/04/2023)   Hunger Vital Sign    Worried About Running Out of Food in the Last Year: Never true    Ran Out of Food in the Last Year: Never true  Transportation Needs: No Transportation Needs (09/04/2023)   PRAPARE - Administrator, Civil Service (Medical): No    Lack of Transportation (Non-Medical): No  Physical Activity: Not on file  Stress: Not on file  Social Connections: Not on file  Intimate Partner Violence: Not At Risk (09/04/2023)   Humiliation, Afraid, Rape, and Kick questionnaire    Fear of Current or Ex-Partner: No    Emotionally Abused: No    Physically Abused: No    Sexually Abused: No      PHYSICAL EXAM  Vitals:   10/03/23 0907  BP: (!) 152/74  Pulse: 96  Weight: 158 lb (71.7 kg)  Height: 5\' 4"  (1.626 m)   Body mass index is 27.12 kg/m.  Generalized: Well developed, in no acute distress   Neurological examination  Mentation: Alert oriented to time, place, history taking. Follows all commands. Mild slurred speech. Cranial nerve II-XII: Pupils were equal round reactive to light. Extraocular movements were full, visual field were full on confrontational test. Facial sensation and strength were normal. Uvula tongue midline. Head turning and shoulder shrug  were normal and symmetric. Motor: The motor testing reveals 5 over 5 strength of all 4 extremities with the exception of being slightly weaker in the left upper extremity.  Good symmetric motor tone is noted throughout.  Sensory: Sensory testing is intact to soft touch on all 4 extremities. No evidence of extinction is noted.  Coordination: Cerebellar testing reveals good finger-nose-finger and heel-to-shin bilaterally.  Gait and station: Uses a rollator when ambulating.  Gait is slow and cautious.   Tandem gait not attempted.   Reflexes: Deep tendon reflexes are symmetric and normal bilaterally.   DIAGNOSTIC DATA (LABS, IMAGING, TESTING) - I reviewed patient records, labs, notes, testing and imaging myself where available.  Lab Results  Component Value Date   WBC 11.4 (H) 09/05/2023  HGB 11.8 (L) 09/05/2023   HCT 37.2 09/05/2023   MCV 97.9 09/05/2023   PLT 180 09/05/2023      Component Value Date/Time   NA 141 09/07/2023 0538   K 4.0 09/07/2023 0538   CL 106 09/07/2023 0538   CO2 27 09/07/2023 0538   GLUCOSE 192 (H) 09/07/2023 0538   BUN 22 09/07/2023 0538   CREATININE 1.09 (H) 09/07/2023 0538   CREATININE 1.25 (H) 12/22/2022 0000   CALCIUM 8.6 (L) 09/07/2023 0538   PROT 6.9 08/29/2023 1630   ALBUMIN 3.9 08/29/2023 1630   AST 29 08/29/2023 1630   ALT 30 08/29/2023 1630   ALKPHOS 118 08/29/2023 1630   BILITOT 0.7 08/29/2023 1630   GFRNONAA 50 (L) 09/07/2023 0538   GFRNONAA 59 (L) 01/05/2021 0948   GFRAA 69 01/05/2021 0948   Lab Results  Component Value Date   CHOL 138 08/30/2023   HDL 54 08/30/2023   LDLCALC 68 08/30/2023   TRIG 82 08/30/2023   CHOLHDL 2.6 08/30/2023   Lab Results  Component Value Date   HGBA1C 10.1 (H) 08/12/2023   Lab Results  Component Value Date   VITAMINB12 567 12/22/2022   Lab Results  Component Value Date   TSH 1.07 12/22/2022      ASSESSMENT AND PLAN 82 y.o. year old female  has a past medical history of Allergic rhinitis, Anemia, Arrhythmia, CAD (coronary artery disease), Chest pain, Chronic kidney disease, stage 3a (HCC) (08/11/2023), Combined systolic and diastolic heart failure (HCC), Diabetes mellitus without complication (HCC), Diabetic peripheral neuropathy (HCC), GERD (gastroesophageal reflux disease), Heart block, Hyperlipidemia, Hypertension, Left bundle branch block, Low back pain, Myocardial infarction (HCC), Peripheral arterial disease (HCC), and Retinopathy. here with:  Acute Ischemic Infarct:  Right watershed  and embolic frontal and parietal stroke Etiology: Likely from stenosed right ICA Hyperlipidemia History of TCAR New onset A-fib   Continue aspirin 81 mg daily and clopidogrel 75 mg daily  a for secondary stroke prevention.  Her cardiology office will be reaching out to her to discuss anticoagulation therapy due to recent diagnosis of A-fib transmitted through her pacemaker.  Discussed secondary stroke prevention measures and importance of close PCP follow up for aggressive stroke risk factor management. I have gone over the pathophysiology of stroke, warning signs and symptoms, risk factors and their management in some detail with instructions to go to the closest emergency room for symptoms of concern. HTN: BP goal <130/90.   HLD: LDL goal <70. Recent LDL 68 on Lipitor 80 mg daily  DMII: A1c goal<7.0. Recent A1c 10.1.  Encouraged patient to monitor diet and encouraged exercise FU with our office 6-7 months or sooner if needed      Butch Penny, MSN, NP-C 10/03/2023, 8:58 AM [facility] [address], [address] [place], [place] [address] [phone]

## 2023-10-04 NOTE — Telephone Encounter (Signed)
Transmission sent today: patient still in ongoing AF event.  Eliquis sent in last night for patient to start.  Daughter called back and instructions given and directions to clinic. No further questions at this time.

## 2023-10-05 ENCOUNTER — Ambulatory Visit (HOSPITAL_BASED_OUTPATIENT_CLINIC_OR_DEPARTMENT_OTHER)
Admission: RE | Admit: 2023-10-05 | Discharge: 2023-10-05 | Disposition: A | Discharge status: Home / Self Care | Payer: 59 | Source: Ambulatory Visit | Attending: Physician Assistant | Admitting: Physician Assistant

## 2023-10-05 ENCOUNTER — Ambulatory Visit (INDEPENDENT_AMBULATORY_CARE_PROVIDER_SITE_OTHER): Payer: 59 | Admitting: Vascular Surgery

## 2023-10-05 ENCOUNTER — Encounter: Payer: Self-pay | Admitting: Vascular Surgery

## 2023-10-05 ENCOUNTER — Encounter (HOSPITAL_COMMUNITY): Payer: Self-pay | Admitting: Physician Assistant

## 2023-10-05 ENCOUNTER — Ambulatory Visit (HOSPITAL_COMMUNITY)
Admission: RE | Admit: 2023-10-05 | Discharge: 2023-10-05 | Disposition: A | Discharge status: Home / Self Care | Payer: 59 | Source: Ambulatory Visit | Attending: Vascular Surgery | Admitting: Vascular Surgery

## 2023-10-05 VITALS — BP 124/84 | HR 88 | Ht 64.0 in | Wt 157.6 lb

## 2023-10-05 VITALS — BP 145/88 | HR 80 | Temp 97.7°F | Resp 20 | Ht 64.0 in | Wt 156.0 lb

## 2023-10-05 DIAGNOSIS — I6529 Occlusion and stenosis of unspecified carotid artery: Secondary | ICD-10-CM | POA: Diagnosis present

## 2023-10-05 DIAGNOSIS — D6869 Other thrombophilia: Secondary | ICD-10-CM

## 2023-10-05 DIAGNOSIS — I480 Paroxysmal atrial fibrillation: Secondary | ICD-10-CM | POA: Diagnosis present

## 2023-10-05 DIAGNOSIS — I6521 Occlusion and stenosis of right carotid artery: Secondary | ICD-10-CM

## 2023-10-05 NOTE — Progress Notes (Signed)
Primary Care Physician: Fleet Contras, MD Primary Cardiologist: Donato Schultz, MD Electrophysiologist: Lanier Prude, MD  Referring Physician: Device clinic    Amanda Stewart is a 82 y.o. 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  The patient was initially diagnosed with atrial fibrillation 10/03/23 on a remote device alert. The episode started 11/4 and ended 11/7. She was started on Eliquis for a CHADS2VASC score of 8.  On follow up today, patient reports that she feels well. She was unaware of her arrhythmia. She is currently on triple therapy following her R TCAR for her carotid stenosis. No bleeding issues currently.   Today, she denies symptoms of palpitations, chest pain, shortness of breath, orthopnea, PND, lower extremity edema, dizziness, presyncope, syncope, snoring, daytime somnolence, bleeding, or neurologic sequela. The patient is tolerating medications without difficulties and is otherwise without complaint today.    Atrial Fibrillation Risk Factors:  she does not have symptoms or diagnosis of sleep apnea. she does not have a history of rheumatic fever.   Atrial Fibrillation Management history:  Previous antiarrhythmic drugs: none Previous cardioversions: none Previous ablations: none Anticoagulation history: Eliquis  ROS- All systems are reviewed and negative except as per the HPI above.  Past Medical History:  Diagnosis Date   Allergic rhinitis    Anemia    Arrhythmia    CAD (coronary artery disease)    Chest pain    Chronic kidney disease, stage 3a (HCC) 08/11/2023   Combined systolic and diastolic heart failure (HCC)    Diabetes mellitus without complication (HCC)    Diabetic peripheral neuropathy (HCC)    GERD (gastroesophageal reflux disease)    Heart block    Hyperlipidemia    Hypertension    Left  bundle branch block    Low back pain    Myocardial infarction (HCC)    Peripheral arterial disease (HCC)    Retinopathy     Current Outpatient Medications  Medication Sig Dispense Refill   acetaminophen (TYLENOL) 325 MG tablet Take 1-2 tablets (325-650 mg total) by mouth every 4 (four) hours as needed for mild pain. (Patient taking differently: Take 325 mg by mouth every 4 (four) hours as needed for mild pain (pain score 1-3).)     albuterol (VENTOLIN HFA) 108 (90 Base) MCG/ACT inhaler Inhale 2 puffs into the lungs every 6 (six) hours as needed for wheezing or shortness of breath.     amLODipine (NORVASC) 5 MG tablet Take 5 mg by mouth daily.     apixaban (ELIQUIS) 5 MG TABS tablet Take 1 tablet (5 mg total) by mouth 2 (two) times daily. 60 tablet 3   aspirin EC 81 MG tablet Take 81 mg by mouth daily.     atorvastatin (LIPITOR) 80 MG tablet Take 1 tablet (80 mg total) by mouth daily. 30 tablet 3   benzonatate (TESSALON) 100 MG capsule Take 100-200 mg by mouth every 8 (eight) hours as needed for cough.     clopidogrel (PLAVIX) 75 MG tablet Take 1 tablet (75 mg total) by mouth daily. 39 tablet 3   dorzolamide (TRUSOPT) 2 % ophthalmic solution Place 1 drop into both eyes 2 (two) times daily.     empagliflozin (JARDIANCE) 25 MG TABS tablet Take 25 mg by mouth daily.     glimepiride (AMARYL) 1 MG tablet Take 1  mg by mouth daily with breakfast.     losartan (COZAAR) 100 MG tablet Take 100 mg by mouth daily.     metoprolol tartrate (LOPRESSOR) 25 MG tablet Take 1 tablet (25 mg total) by mouth 2 (two) times daily. 60 tablet 3   Multiple Vitamin (MULTIVITAMIN WITH MINERALS) TABS tablet Take 1 tablet by mouth daily.     nitroGLYCERIN (NITROSTAT) 0.4 MG SL tablet Place 1 tablet (0.4 mg total) under the tongue every 5 (five) minutes x 3 doses as needed for chest pain. 25 tablet 1   pantoprazole (PROTONIX) 40 MG tablet Take 1 tablet (40 mg total) by mouth daily at 6 (six) AM. 30 tablet 3   potassium  chloride (KLOR-CON) 10 MEQ tablet Take 10 mEq by mouth daily.     traMADol (ULTRAM) 50 MG tablet Take 50 mg by mouth 2 (two) times daily as needed.     No current facility-administered medications for this encounter.    Physical Exam: BP 124/84   Pulse 88   Ht 5\' 4"  (1.626 m)   Wt 71.5 kg   BMI 27.05 kg/m   GEN: Well nourished, well developed in no acute distress NECK: No JVD; No carotid bruits CARDIAC: Regular rate and rhythm, no murmurs, rubs, gallops RESPIRATORY:  Clear to auscultation without rales, wheezing or rhonchi  ABDOMEN: Soft, non-tender, non-distended EXTREMITIES:  No edema; No deformity   Wt Readings from Last 3 Encounters:  10/05/23 71.5 kg  10/05/23 70.8 kg  10/03/23 71.7 kg     EKG today demonstrates  A sense V paced rhythm Vent. rate 88 BPM PR interval 186 ms QRS duration 170 ms QT/QTcB 442/534 ms   Echo 08/12/23 demonstrated   1. Left ventricular ejection fraction, by estimation, is 65 to 70%. The  left ventricle has normal function. The left ventricle has no regional  wall motion abnormalities. 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 Elevated left ventricular end-diastolic pressure.   2. Right ventricular systolic function is normal. The right ventricular  size is normal. There is normal pulmonary artery systolic pressure.   3. Left atrial size was mildly dilated.   4. The mitral valve is normal in structure. No evidence of mitral valve  regurgitation. Moderate mitral annular calcification.   5. The aortic valve is calcified. Aortic valve regurgitation is not  visualized. Mild to moderate aortic valve stenosis. Aortic valve area, by  VTI measures 1.38 cm. Aortic valve mean gradient measures 17.5 mmHg.    CHA2DS2-VASc Score = 8  The patient's score is based upon: CHF History: 0 HTN History: 1 Diabetes History: 1 Stroke History: 2 Vascular Disease History: 1 Age Score: 2 Gender Score: 1        ASSESSMENT AND PLAN: Paroxysmal Atrial Fibrillation (ICD10:  I48.0) The patient's CHA2DS2-VASc score is 8, indicating a 10.8% annual risk of stroke.   General education about afib provided and questions answered. We also discussed her stroke risk and the risks and benefits of anticoagulation. Continue Eliquis 5 mg BID. This will be a long term medication for her, would like to avoid triple therapy if possible. Per Dr Konrad Felix note, possible to discontinue Plavix 3 months post R TCAR and continue Eliquis and ASA. Continue Lopressor 25 mg BID Continue to monitor burden on PPM.  Secondary Hypercoagulable State (ICD10:  D68.69) The patient is at significant risk for stroke/thromboembolism based upon her CHA2DS2-VASc Score of 8.  Continue Apixaban (Eliquis).   CHB S/p PPM, followed  by Dr Lalla Brothers and the device clinic  CAD No anginal symptoms Followed by Dr Anne Fu  HTN Stable on current regimen    Follow up in the AF clinic in 3 months.        Jorja Loa PA-C Afib Clinic [facility] [address] [place], [place] [address] [phone]

## 2023-10-08 NOTE — Progress Notes (Signed)
I agree with the above plan 

## 2023-10-16 ENCOUNTER — Ambulatory Visit: Payer: 59 | Admitting: Sports Medicine

## 2023-10-19 ENCOUNTER — Other Ambulatory Visit: Payer: Self-pay

## 2023-10-19 DIAGNOSIS — I6529 Occlusion and stenosis of unspecified carotid artery: Secondary | ICD-10-CM

## 2023-11-05 ENCOUNTER — Ambulatory Visit (INDEPENDENT_AMBULATORY_CARE_PROVIDER_SITE_OTHER): Payer: 59

## 2023-11-05 DIAGNOSIS — I442 Atrioventricular block, complete: Secondary | ICD-10-CM

## 2023-11-07 LAB — CUP PACEART REMOTE DEVICE CHECK
Battery Remaining Longevity: 98 mo
Battery Remaining Percentage: 80 %
Battery Voltage: 3.01 V
Brady Statistic AP VP Percent: 12 %
Brady Statistic AP VS Percent: 1 %
Brady Statistic AS VP Percent: 84 %
Brady Statistic AS VS Percent: 3 %
Brady Statistic RA Percent Paced: 11 %
Brady Statistic RV Percent Paced: 93 %
Date Time Interrogation Session: 20241209022348
Implantable Lead Connection Status: 753985
Implantable Lead Connection Status: 753985
Implantable Lead Implant Date: 20220912
Implantable Lead Implant Date: 20220912
Implantable Lead Location: 753859
Implantable Lead Location: 753860
Implantable Pulse Generator Implant Date: 20220912
Lead Channel Impedance Value: 380 Ohm
Lead Channel Impedance Value: 510 Ohm
Lead Channel Pacing Threshold Amplitude: 0.625 V
Lead Channel Pacing Threshold Amplitude: 0.625 V
Lead Channel Pacing Threshold Pulse Width: 0.5 ms
Lead Channel Pacing Threshold Pulse Width: 0.5 ms
Lead Channel Sensing Intrinsic Amplitude: 12 mV
Lead Channel Sensing Intrinsic Amplitude: 2.9 mV
Lead Channel Setting Pacing Amplitude: 0.875
Lead Channel Setting Pacing Amplitude: 1.625
Lead Channel Setting Pacing Pulse Width: 0.5 ms
Lead Channel Setting Sensing Sensitivity: 4 mV
Pulse Gen Model: 2272
Pulse Gen Serial Number: 3956661

## 2023-11-10 ENCOUNTER — Encounter (HOSPITAL_COMMUNITY): Payer: Self-pay | Admitting: Emergency Medicine

## 2023-11-10 ENCOUNTER — Inpatient Hospital Stay (HOSPITAL_COMMUNITY)
Admission: EM | Admit: 2023-11-10 | Discharge: 2023-11-16 | DRG: 920 | Disposition: A | Discharge status: Home / Self Care | Payer: 59 | Attending: Internal Medicine | Admitting: Internal Medicine

## 2023-11-10 ENCOUNTER — Emergency Department (HOSPITAL_COMMUNITY): Payer: 59

## 2023-11-10 ENCOUNTER — Other Ambulatory Visit: Payer: Self-pay

## 2023-11-10 DIAGNOSIS — I252 Old myocardial infarction: Secondary | ICD-10-CM

## 2023-11-10 DIAGNOSIS — Z823 Family history of stroke: Secondary | ICD-10-CM

## 2023-11-10 DIAGNOSIS — Z950 Presence of cardiac pacemaker: Secondary | ICD-10-CM | POA: Diagnosis present

## 2023-11-10 DIAGNOSIS — D649 Anemia, unspecified: Secondary | ICD-10-CM

## 2023-11-10 DIAGNOSIS — Z90710 Acquired absence of both cervix and uterus: Secondary | ICD-10-CM

## 2023-11-10 DIAGNOSIS — I480 Paroxysmal atrial fibrillation: Secondary | ICD-10-CM | POA: Diagnosis present

## 2023-11-10 DIAGNOSIS — Z7982 Long term (current) use of aspirin: Secondary | ICD-10-CM

## 2023-11-10 DIAGNOSIS — I442 Atrioventricular block, complete: Secondary | ICD-10-CM | POA: Diagnosis present

## 2023-11-10 DIAGNOSIS — N1831 Chronic kidney disease, stage 3a: Secondary | ICD-10-CM | POA: Diagnosis present

## 2023-11-10 DIAGNOSIS — E861 Hypovolemia: Secondary | ICD-10-CM

## 2023-11-10 DIAGNOSIS — R54 Age-related physical debility: Secondary | ICD-10-CM | POA: Diagnosis present

## 2023-11-10 DIAGNOSIS — Z888 Allergy status to other drugs, medicaments and biological substances status: Secondary | ICD-10-CM

## 2023-11-10 DIAGNOSIS — E8729 Other acidosis: Secondary | ICD-10-CM

## 2023-11-10 DIAGNOSIS — E872 Acidosis, unspecified: Secondary | ICD-10-CM | POA: Diagnosis present

## 2023-11-10 DIAGNOSIS — E1151 Type 2 diabetes mellitus with diabetic peripheral angiopathy without gangrene: Secondary | ICD-10-CM | POA: Diagnosis present

## 2023-11-10 DIAGNOSIS — E1142 Type 2 diabetes mellitus with diabetic polyneuropathy: Secondary | ICD-10-CM | POA: Diagnosis present

## 2023-11-10 DIAGNOSIS — N179 Acute kidney failure, unspecified: Secondary | ICD-10-CM | POA: Diagnosis not present

## 2023-11-10 DIAGNOSIS — E785 Hyperlipidemia, unspecified: Secondary | ICD-10-CM | POA: Diagnosis present

## 2023-11-10 DIAGNOSIS — K0889 Other specified disorders of teeth and supporting structures: Secondary | ICD-10-CM | POA: Diagnosis present

## 2023-11-10 DIAGNOSIS — T45525A Adverse effect of antithrombotic drugs, initial encounter: Secondary | ICD-10-CM | POA: Diagnosis present

## 2023-11-10 DIAGNOSIS — Z95820 Peripheral vascular angioplasty status with implants and grafts: Secondary | ICD-10-CM

## 2023-11-10 DIAGNOSIS — K91840 Postprocedural hemorrhage and hematoma of a digestive system organ or structure following a digestive system procedure: Secondary | ICD-10-CM | POA: Diagnosis not present

## 2023-11-10 DIAGNOSIS — D6832 Hemorrhagic disorder due to extrinsic circulating anticoagulants: Secondary | ICD-10-CM | POA: Diagnosis present

## 2023-11-10 DIAGNOSIS — Z794 Long term (current) use of insulin: Secondary | ICD-10-CM

## 2023-11-10 DIAGNOSIS — Y848 Other medical procedures as the cause of abnormal reaction of the patient, or of later complication, without mention of misadventure at the time of the procedure: Secondary | ICD-10-CM | POA: Diagnosis present

## 2023-11-10 DIAGNOSIS — I9589 Other hypotension: Secondary | ICD-10-CM | POA: Diagnosis present

## 2023-11-10 DIAGNOSIS — E1165 Type 2 diabetes mellitus with hyperglycemia: Secondary | ICD-10-CM | POA: Diagnosis present

## 2023-11-10 DIAGNOSIS — L03221 Cellulitis of neck: Secondary | ICD-10-CM | POA: Diagnosis present

## 2023-11-10 DIAGNOSIS — D62 Acute posthemorrhagic anemia: Secondary | ICD-10-CM | POA: Diagnosis not present

## 2023-11-10 DIAGNOSIS — I959 Hypotension, unspecified: Secondary | ICD-10-CM

## 2023-11-10 DIAGNOSIS — Z7984 Long term (current) use of oral hypoglycemic drugs: Secondary | ICD-10-CM

## 2023-11-10 DIAGNOSIS — I130 Hypertensive heart and chronic kidney disease with heart failure and stage 1 through stage 4 chronic kidney disease, or unspecified chronic kidney disease: Secondary | ICD-10-CM | POA: Diagnosis present

## 2023-11-10 DIAGNOSIS — I6521 Occlusion and stenosis of right carotid artery: Secondary | ICD-10-CM | POA: Diagnosis present

## 2023-11-10 DIAGNOSIS — Z833 Family history of diabetes mellitus: Secondary | ICD-10-CM

## 2023-11-10 DIAGNOSIS — K219 Gastro-esophageal reflux disease without esophagitis: Secondary | ICD-10-CM | POA: Diagnosis present

## 2023-11-10 DIAGNOSIS — E119 Type 2 diabetes mellitus without complications: Secondary | ICD-10-CM

## 2023-11-10 DIAGNOSIS — E1122 Type 2 diabetes mellitus with diabetic chronic kidney disease: Secondary | ICD-10-CM | POA: Diagnosis present

## 2023-11-10 DIAGNOSIS — Z8673 Personal history of transient ischemic attack (TIA), and cerebral infarction without residual deficits: Secondary | ICD-10-CM

## 2023-11-10 DIAGNOSIS — I5042 Chronic combined systolic (congestive) and diastolic (congestive) heart failure: Secondary | ICD-10-CM | POA: Diagnosis present

## 2023-11-10 DIAGNOSIS — E860 Dehydration: Secondary | ICD-10-CM | POA: Diagnosis present

## 2023-11-10 DIAGNOSIS — I739 Peripheral vascular disease, unspecified: Secondary | ICD-10-CM | POA: Diagnosis present

## 2023-11-10 DIAGNOSIS — Z7901 Long term (current) use of anticoagulants: Secondary | ICD-10-CM

## 2023-11-10 DIAGNOSIS — I2510 Atherosclerotic heart disease of native coronary artery without angina pectoris: Secondary | ICD-10-CM | POA: Diagnosis present

## 2023-11-10 DIAGNOSIS — E11319 Type 2 diabetes mellitus with unspecified diabetic retinopathy without macular edema: Secondary | ICD-10-CM | POA: Diagnosis present

## 2023-11-10 DIAGNOSIS — T45515A Adverse effect of anticoagulants, initial encounter: Secondary | ICD-10-CM | POA: Diagnosis present

## 2023-11-10 DIAGNOSIS — Z7902 Long term (current) use of antithrombotics/antiplatelets: Secondary | ICD-10-CM

## 2023-11-10 DIAGNOSIS — Z79899 Other long term (current) drug therapy: Secondary | ICD-10-CM

## 2023-11-10 LAB — CBC WITH DIFFERENTIAL/PLATELET
Abs Immature Granulocytes: 0.11 10*3/uL — ABNORMAL HIGH (ref 0.00–0.07)
Basophils Absolute: 0.1 10*3/uL (ref 0.0–0.1)
Basophils Relative: 1 %
Eosinophils Absolute: 0.2 10*3/uL (ref 0.0–0.5)
Eosinophils Relative: 2 %
HCT: 26 % — ABNORMAL LOW (ref 36.0–46.0)
Hemoglobin: 8.3 g/dL — ABNORMAL LOW (ref 12.0–15.0)
Immature Granulocytes: 1 %
Lymphocytes Relative: 16 %
Lymphs Abs: 2.1 10*3/uL (ref 0.7–4.0)
MCH: 32.5 pg (ref 26.0–34.0)
MCHC: 31.9 g/dL (ref 30.0–36.0)
MCV: 102 fL — ABNORMAL HIGH (ref 80.0–100.0)
Monocytes Absolute: 1.3 10*3/uL — ABNORMAL HIGH (ref 0.1–1.0)
Monocytes Relative: 10 %
Neutro Abs: 9.3 10*3/uL — ABNORMAL HIGH (ref 1.7–7.7)
Neutrophils Relative %: 70 %
Platelets: 260 10*3/uL (ref 150–400)
RBC: 2.55 MIL/uL — ABNORMAL LOW (ref 3.87–5.11)
RDW: 15.2 % (ref 11.5–15.5)
WBC: 13.1 10*3/uL — ABNORMAL HIGH (ref 4.0–10.5)
nRBC: 0 % (ref 0.0–0.2)

## 2023-11-10 LAB — I-STAT CHEM 8, ED
BUN: 47 mg/dL — ABNORMAL HIGH (ref 8–23)
Calcium, Ion: 1.07 mmol/L — ABNORMAL LOW (ref 1.15–1.40)
Chloride: 105 mmol/L (ref 98–111)
Creatinine, Ser: 3 mg/dL — ABNORMAL HIGH (ref 0.44–1.00)
Glucose, Bld: 181 mg/dL — ABNORMAL HIGH (ref 70–99)
HCT: 25 % — ABNORMAL LOW (ref 36.0–46.0)
Hemoglobin: 8.5 g/dL — ABNORMAL LOW (ref 12.0–15.0)
Potassium: 4.3 mmol/L (ref 3.5–5.1)
Sodium: 136 mmol/L (ref 135–145)
TCO2: 21 mmol/L — ABNORMAL LOW (ref 22–32)

## 2023-11-10 LAB — PROTIME-INR
INR: 1.8 — ABNORMAL HIGH (ref 0.8–1.2)
Prothrombin Time: 21.2 s — ABNORMAL HIGH (ref 11.4–15.2)

## 2023-11-10 LAB — COMPREHENSIVE METABOLIC PANEL
ALT: 14 U/L (ref 0–44)
AST: 17 U/L (ref 15–41)
Albumin: 3 g/dL — ABNORMAL LOW (ref 3.5–5.0)
Alkaline Phosphatase: 83 U/L (ref 38–126)
Anion gap: 16 — ABNORMAL HIGH (ref 5–15)
BUN: 41 mg/dL — ABNORMAL HIGH (ref 8–23)
CO2: 19 mmol/L — ABNORMAL LOW (ref 22–32)
Calcium: 8.8 mg/dL — ABNORMAL LOW (ref 8.9–10.3)
Chloride: 102 mmol/L (ref 98–111)
Creatinine, Ser: 2.77 mg/dL — ABNORMAL HIGH (ref 0.44–1.00)
GFR, Estimated: 16 mL/min — ABNORMAL LOW (ref >60)
Glucose, Bld: 189 mg/dL — ABNORMAL HIGH (ref 70–99)
Potassium: 3.7 mmol/L (ref 3.5–5.1)
Sodium: 137 mmol/L (ref 135–145)
Total Bilirubin: 0.6 mg/dL (ref <1.2)
Total Protein: 6.4 g/dL — ABNORMAL LOW (ref 6.5–8.1)

## 2023-11-10 LAB — TROPONIN I (HIGH SENSITIVITY): Troponin I (High Sensitivity): 19 ng/L — ABNORMAL HIGH (ref <18)

## 2023-11-10 LAB — I-STAT CG4 LACTIC ACID, ED: Lactic Acid, Venous: 2.7 mmol/L (ref 0.5–1.9)

## 2023-11-10 MED ORDER — TRANEXAMIC ACID FOR EPISTAXIS
500.0000 mg | Freq: Once | TOPICAL | Status: AC
  Administered 2023-11-10: 500 mg via TOPICAL
  Filled 2023-11-10: qty 10

## 2023-11-10 MED ORDER — SODIUM CHLORIDE 0.9 % IV BOLUS
500.0000 mL | Freq: Once | INTRAVENOUS | Status: AC
  Administered 2023-11-10: 500 mL via INTRAVENOUS

## 2023-11-10 MED ORDER — SODIUM CHLORIDE 0.9 % IV BOLUS
1000.0000 mL | Freq: Once | INTRAVENOUS | Status: AC
  Administered 2023-11-10: 1000 mL via INTRAVENOUS

## 2023-11-10 NOTE — ED Provider Notes (Signed)
[place] EMERGENCY DEPARTMENT AT [facility] Provider Note   CSN: [id number] Arrival date & time: 11/10/23  2109     History  No chief complaint on file.   Amanda Stewart is a 82 y.o. female.  82 year old female with prior medical history as detailed below presents for evaluation.  Patient is notably on aspirin, Plavix, and Eliquis.  Patient with reported removal of 5 teeth yesterday morning.  Patient with subsequent continued oozing from dental extraction sites.  Patient felt weak yesterday evening and contacted EMS for evaluation.  She refused transport at that time.  She reports continued oozing from her dental extraction sites and progressive weakness over the course of today.  She consented to transport by EMS to the ED this evening.  EMS reports initial blood pressure was in the mid 80s to 90 systolic.  The history is provided by the patient, medical records and the EMS personnel.       Home Medications Prior to Admission medications   Medication Sig Start Date End Date Taking? Authorizing Provider  acetaminophen (TYLENOL) 325 MG tablet Take 1-2 tablets (325-650 mg total) by mouth every 4 (four) hours as needed for mild pain. Patient taking differently: Take 325 mg by mouth every 4 (four) hours as needed for mild pain (pain score 1-3). 08/10/21   [name], MD  albuterol (VENTOLIN HFA) 108 (90 Base) MCG/ACT inhaler Inhale 2 puffs into the lungs every 6 (six) hours as needed for wheezing or shortness of breath.    [provider]  amLODipine (NORVASC) 5 MG tablet Take 5 mg by mouth daily. 09/25/23   [provider]  apixaban (ELIQUIS) 5 MG TABS tablet Take 1 tablet (5 mg total) by mouth 2 (two) times daily. 10/03/23   Marinus Maw, MD  aspirin EC 81 MG tablet Take 81 mg by mouth daily.    [provider]  atorvastatin (LIPITOR) 80 MG tablet Take 1 tablet (80 mg total) by mouth daily. 09/07/23   [name], MD  benzonatate  (TESSALON) 100 MG capsule Take 100-200 mg by mouth every 8 (eight) hours as needed for cough. 07/27/23   [provider]  clopidogrel (PLAVIX) 75 MG tablet Take 1 tablet (75 mg total) by mouth daily. 09/07/23   [name], MD  dorzolamide (TRUSOPT) 2 % ophthalmic solution Place 1 drop into both eyes 2 (two) times daily. 07/09/21   [provider]  empagliflozin (JARDIANCE) 25 MG TABS tablet Take 25 mg by mouth daily.    [provider]  glimepiride (AMARYL) 1 MG tablet Take 1 mg by mouth daily with breakfast.    [provider]  losartan (COZAAR) 100 MG tablet Take 100 mg by mouth daily. 10/01/23   [provider]  metoprolol tartrate (LOPRESSOR) 25 MG tablet Take 1 tablet (25 mg total) by mouth 2 (two) times daily. 08/13/23   Lewie Chamber, MD  Multiple Vitamin (MULTIVITAMIN WITH MINERALS) TABS tablet Take 1 tablet by mouth daily.    [provider]  nitroGLYCERIN (NITROSTAT) 0.4 MG SL tablet Place 1 tablet (0.4 mg total) under the tongue every 5 (five) minutes x 3 doses as needed for chest pain. 12/12/14   [name], MD  pantoprazole (PROTONIX) 40 MG tablet Take 1 tablet (40 mg total) by mouth daily at 6 (six) AM. 08/11/21   [name], MD  potassium chloride (KLOR-CON) 10 MEQ tablet Take 10 mEq by mouth daily. 10/02/23   [provider]  traMADol (  ULTRAM) 50 MG tablet Take 50 mg by mouth 2 (two) times daily as needed. 09/25/23   [provider]      Allergies    Metformin and related    Review of Systems   Review of Systems  All other systems reviewed and are negative.   Physical Exam Updated Vital Signs There were no vitals taken for this visit. Physical Exam Vitals and nursing note reviewed.  Constitutional:      General: She is not in acute distress.    Appearance: Normal appearance. She is well-developed.  HENT:     Head: Normocephalic and atraumatic.     Mouth/Throat:     Comments: Patient is  holding gauze in her mouth against the gum where her teeth were extracted.  Minimal blood noted on gauze itself.  No active significant hemorrhage noted. Eyes:     Conjunctiva/sclera: Conjunctivae normal.     Pupils: Pupils are equal, round, and reactive to light.  Cardiovascular:     Rate and Rhythm: Normal rate and regular rhythm.     Heart sounds: Normal heart sounds.  Pulmonary:     Effort: Pulmonary effort is normal. No respiratory distress.     Breath sounds: Normal breath sounds.  Abdominal:     General: There is no distension.     Palpations: Abdomen is soft.     Tenderness: There is no abdominal tenderness.  Musculoskeletal:        General: No deformity. Normal range of motion.     Cervical back: Normal range of motion and neck supple.  Skin:    General: Skin is warm and dry.  Neurological:     General: No focal deficit present.     Mental Status: She is alert and oriented to person, place, and time.     ED Results / Procedures / Treatments   Labs (all labs ordered are listed, but only abnormal results are displayed) Labs Reviewed - No data to display  EKG None  Radiology No results found.  Procedures Procedures    Medications Ordered in ED Medications - No data to display  ED Course/ Medical Decision Making/ A&P                                 Medical Decision Making Amount and/or Complexity of Data Reviewed Labs: ordered. Radiology: ordered.  Risk Decision regarding hospitalization.    Medical Screen Complete  This patient presented to the ED with complaint of weakness.  This complaint involves an extensive number of treatment options. The initial differential diagnosis includes, but is not limited to, anemia, metabolic abnormality, etc.  This presentation is: Acute, Chronic, Self-Limited, Previously Undiagnosed, Uncertain Prognosis, Complicated, Systemic Symptoms, and Threat to Life/Bodily Function  Patient with recent dental extraction of  5 teeth from the upper jaw.  Patient's extraction occurred yesterday morning.  Patient with concurrent use of aspirin, Plavix, Eliquis.  Patient with increasing weakness x 48 hours after dental extraction.  She has been at home holding gauze against the extraction sites reduce bleeding.  She has been taking very little p.o.  Minimal to no active bleeding noted on exam.  TXA soaked gauze given to the patient for oral compression.  Workup for administrate worsening anemia with a hemoglobin 8.3.  Additionally patient with AKI with creatinine of 2.7.  Patient will require admission.  Hospitalist service, Dr. Cyndia Bent aware of case.  Additional history obtained:  Additional history obtained from EMS External records from outside sources obtained and reviewed including prior ED visits and prior Inpatient records.    Lab Tests:  I ordered and personally interpreted labs.  The pertinent results include: CBC, CMP, troponin, INR, type and screen   Imaging Studies ordered:  I ordered imaging studies including chest x-ray I independently visualized and interpreted obtained imaging which showed NAD I agree with the radiologist interpretation.   Cardiac Monitoring:  The patient was maintained on a cardiac monitor.  I personally viewed and interpreted the cardiac monitor which showed an underlying rhythm of: nsr   Medicines ordered:  I ordered medication including IV fluids, oral TXA for AKI, bleeding post dental extraction Reevaluation of the patient after these medicines showed that the patient: improved   Problem List / ED Course:  Weakness, Anemia, AKI   Reevaluation:  After the interventions noted above, I reevaluated the patient and found that they have: stayed the same   Disposition:  After consideration of the diagnostic results and the patients response to treatment, I feel that the patent would benefit from admission.          Final Clinical Impression(s) / ED  Diagnoses Final diagnoses:  AKI (acute kidney injury) (HCC)  Anemia, unspecified type    Rx / DC Orders ED Discharge Orders     None         Wynetta Fines, MD 11/10/23 2358

## 2023-11-10 NOTE — ED Notes (Signed)
Pt is alert and able to answer simple questions.  Most bleeding is to the upper right gums.

## 2023-11-10 NOTE — H&P (Signed)
History and Physical    Patient: Amanda Stewart:[id number] DOB: Sep 05, 1941 DOA: 11/10/2023 DOS: the patient was seen and examined on 11/11/2023 PCP: Fleet Contras, MD  Patient coming from: Home  Chief Complaint:  Chief Complaint  Patient presents with   Post-op Problem   HPI: Amanda Stewart is a 82 y.o. female with medical history significant of CHB s/p pacemaker, PAD s/p R TCAR for symptomatic carotid stenosis on DAPT, HTN, CVA, paroxysmal a.fib on Eliquis, T2DM, CKD 3a presents with weakness and continual bleeding from recent dental surgery.   Pt reports having 6 tooth pulled up 2 days ago and has continued to have bleeding. She is on DAPT and Eliquis and unclear if she held those medications since her daughter helps her with it.  EMS was at her house yesterday and she had soft BP but refused transport. Today BP at 84/42.  Has not been eating or drinking much due to bleeding.   On arrival, she was afebrile, normotensive on room air.  CBC with leukocytosis of 13.1, hemoglobin is downward trended to 8 from a baseline of around 12 two months ago.  Lactate elevated at 2.7.  BMP with anion gap metabolic acidosis with gap of 16, CO2 19, elevated creatinine of 2.77 from 1.  Troponin elevated 19.  She was given TXA soaked gauze on arrival but continues to have bleeding with clots.  Has been administered 1.5L of NS bolus.  Review of Systems: As mentioned in the history of present illness. All other systems reviewed and are negative. Past Medical History:  Diagnosis Date   Allergic rhinitis    Anemia    Arrhythmia    CAD (coronary artery disease)    Chest pain    Chronic kidney disease, stage 3a (HCC) 08/11/2023   Combined systolic and diastolic heart failure (HCC)    Diabetes mellitus without complication (HCC)    Diabetic peripheral neuropathy (HCC)    GERD (gastroesophageal reflux disease)    Heart block    Hyperlipidemia    Hypertension    Left bundle branch block    Low  back pain    Myocardial infarction (HCC)    Peripheral arterial disease (HCC)    Retinopathy    Past Surgical History:  Procedure Laterality Date   ABDOMINAL HYSTERECTOMY     LEFT AND RIGHT HEART CATHETERIZATION WITH CORONARY ANGIOGRAM N/A 05/26/2014   Procedure: LEFT AND RIGHT HEART CATHETERIZATION WITH CORONARY ANGIOGRAM;  Surgeon: [name], MD;  Location: [facility] CATH LAB;  Service: Cardiovascular;  Laterality: N/A;   PACEMAKER IMPLANT N/A 08/08/2021   Procedure: PACEMAKER IMPLANT;  Surgeon: Lanier Prude, MD;  Location: [facility] INVASIVE CV LAB;  Service: Cardiovascular;  Laterality: N/A;   TEMPORARY PACEMAKER N/A 08/07/2021   Procedure: TEMPORARY PACEMAKER;  Surgeon: Rinaldo Cloud, MD;  Location: [facility] INVASIVE CV LAB;  Service: Cardiovascular;  Laterality: N/A;   TRANSCAROTID ARTERY REVASCULARIZATION  Right 09/04/2023   Procedure: Transcarotid Artery Revascularization;  Surgeon: Daria Pastures, MD;  Location: [facility] OR;  Service: Vascular;  Laterality: Right;   ULTRASOUND GUIDANCE FOR VASCULAR ACCESS Left 09/04/2023   Procedure: ULTRASOUND GUIDANCE FOR VASCULAR ACCESS;  Surgeon: Daria Pastures, MD;  Location: [facility] OR;  Service: Vascular;  Laterality: Left;   Social History:  reports that she has never smoked. She has never used smokeless tobacco. She reports current alcohol use of about 1.0 standard drink of alcohol per week. She reports that she does not use drugs.  Allergies  Allergen  Reactions   Metformin And Related Other (See Comments)    Diarrhea     Family History  Problem Relation Age of Onset   Diabetes Mother    Diabetes Father    Stroke Father     Prior to Admission medications   Medication Sig Start Date End Date Taking? Authorizing Provider  acetaminophen (TYLENOL) 325 MG tablet Take 1-2 tablets (325-650 mg total) by mouth every 4 (four) hours as needed for mild pain. Patient taking differently: Take 325 mg by mouth every 4 (four) hours as needed for mild pain (pain  score 1-3). 08/10/21   [name], MD  albuterol (VENTOLIN HFA) 108 (90 Base) MCG/ACT inhaler Inhale 2 puffs into the lungs every 6 (six) hours as needed for wheezing or shortness of breath.    [provider]  amLODipine (NORVASC) 5 MG tablet Take 5 mg by mouth daily. 09/25/23   [provider]  apixaban (ELIQUIS) 5 MG TABS tablet Take 1 tablet (5 mg total) by mouth 2 (two) times daily. 10/03/23   Marinus Maw, MD  aspirin EC 81 MG tablet Take 81 mg by mouth daily.    [provider]  atorvastatin (LIPITOR) 80 MG tablet Take 1 tablet (80 mg total) by mouth daily. 09/07/23   [name], MD  benzonatate (TESSALON) 100 MG capsule Take 100-200 mg by mouth every 8 (eight) hours as needed for cough. 07/27/23   [provider]  clopidogrel (PLAVIX) 75 MG tablet Take 1 tablet (75 mg total) by mouth daily. 09/07/23   [name], MD  dorzolamide (TRUSOPT) 2 % ophthalmic solution Place 1 drop into both eyes 2 (two) times daily. 07/09/21   [provider]  empagliflozin (JARDIANCE) 25 MG TABS tablet Take 25 mg by mouth daily.    [provider]  glimepiride (AMARYL) 1 MG tablet Take 1 mg by mouth daily with breakfast.    [provider]  losartan (COZAAR) 100 MG tablet Take 100 mg by mouth daily. 10/01/23   [provider]  metoprolol tartrate (LOPRESSOR) 25 MG tablet Take 1 tablet (25 mg total) by mouth 2 (two) times daily. 08/13/23   Lewie Chamber, MD  Multiple Vitamin (MULTIVITAMIN WITH MINERALS) TABS tablet Take 1 tablet by mouth daily.    [provider]  nitroGLYCERIN (NITROSTAT) 0.4 MG SL tablet Place 1 tablet (0.4 mg total) under the tongue every 5 (five) minutes x 3 doses as needed for chest pain. 12/12/14   [name], MD  pantoprazole (PROTONIX) 40 MG tablet Take 1 tablet (40 mg total) by mouth daily at 6 (six) AM. 08/11/21   [name], MD  potassium chloride (KLOR-CON) 10 MEQ tablet Take 10 mEq  by mouth daily. 10/02/23   [provider]  traMADol (ULTRAM) 50 MG tablet Take 50 mg by mouth 2 (two) times daily as needed. 09/25/23   [provider]    Physical Exam: Vitals:   11/10/23 2215 11/10/23 2245 11/10/23 2300 11/10/23 2315  BP: (!) 117/55 (!) 117/50 (!) 95/44 (!) 98/36  Pulse: 81 82 84 84  Resp:   20 18  Temp:      TempSrc:      SpO2: 100% 100% 100% 100%   Constitutional: NAD, calm, comfortable, elderly female lying at incline in bed Eyes: lids and conjunctivae normal ENMT: Mucous membranes are moist. Has only 2 teeth on lower jaw. Has slow active bleeding to right upper with guaze holding compression.  Neck: normal, supple Respiratory: clear  to auscultation bilaterally, no wheezing, no crackles. Normal respiratory effort. No accessory muscle use.  Cardiovascular: Regular rate and rhythm, no murmurs / rubs / gallops. No extremity edema Abdomen: no tenderness, soft Musculoskeletal: no clubbing / cyanosis. No joint deformity upper and lower extremities. Normal muscle tone.  Skin: no rashes, lesions, ulcers. No induration Neurologic: CN 2-12 grossly intact.   Psychiatric: Normal judgment and insight. Alert and oriented x 3. Normal mood.   Data Reviewed:  See HPI  Assessment and Plan: * Hypotension -secondary to ongoing oral/gum bleed from recent dental extraction -continue compression with gauze -Continue IV fluid hydration - Hold home antihypertensives  Acute blood loss anemia -Secondary to recent dental extraction of 6 teeth while on dual antiplatelet therapy and Eliquis - Will continue to hold all antiplatelet and anticoagulation -Continue compression with gauze -Hgb has downward trended from baseline of 12  in Oct to 8.3 on presentation. Denies other bleeding or melena prior to her dental procedure.  -Transfuse threshold of Hgb <7   Increased anion gap metabolic acidosis -secondary to AKI, dehydration -continue IV fluid hydration  AKI  (acute kidney injury) (HCC) Prerenal AKI. - Will keep on continuous IV fluids and trend creatinine  Paroxysmal atrial fibrillation (HCC) - Based on ED report does not appear that patient had her anticoagulation held prior to procedure.  Unable to reach both her daughters to verify.  Will need to continue to hold Eliquis with ongoing bleeding, hypotension and severe anemia.  PAD (peripheral artery disease) (HCC) -s/p R TCAR for symptomatic carotid stenosis in October -she is to remain on DAPT until January but unfortunately it does not appear that any of her antiplatelets or anticoagulation was held prior to dental procedure. -Will need to hold aspirin and Plavix due to ongoing bleeding with hypotension and significant blood loss anemia  Complete heart block (HCC) S/p pacemaker  Diabetes mellitus, type 2 (HCC) - Uncontrolled with last A1c of 10.1 in September - Placed on sliding scale insulin      Advance Care Planning: Full  Consults: none  Family Communication: was sent to voicemail for both daughter's phone number on record.   Severity of Illness: The appropriate patient status for this patient is OBSERVATION. Observation status is judged to be reasonable and necessary in order to provide the required intensity of service to ensure the patient's safety. The patient's presenting symptoms, physical exam findings, and initial radiographic and laboratory data in the context of their medical condition is felt to place them at decreased risk for further clinical deterioration. Furthermore, it is anticipated that the patient will be medically stable for discharge from the hospital within 2 midnights of admission.   Author: Anselm Jungling, DO 11/11/2023 12:31 AM  For on call review [email or web address].

## 2023-11-10 NOTE — ED Notes (Signed)
Daughter Angelia called, she does not know the name of the provider who did the surgery, she will call in the AM when she obtains the information.

## 2023-11-10 NOTE — ED Triage Notes (Signed)
Per EMS, pt from home, had surgery to remove teeth yesterday, no two blood thinners which were not stopped prior.  EMS was called out to the house yesterday, pt refused transport, soft bp then.  EMS called out again today b/c the gums continued to bleed.  She appears more lethargic today and blood pressure continues to be soft @ 84/42.

## 2023-11-11 DIAGNOSIS — I442 Atrioventricular block, complete: Secondary | ICD-10-CM | POA: Diagnosis present

## 2023-11-11 DIAGNOSIS — E1122 Type 2 diabetes mellitus with diabetic chronic kidney disease: Secondary | ICD-10-CM | POA: Diagnosis present

## 2023-11-11 DIAGNOSIS — I9589 Other hypotension: Secondary | ICD-10-CM | POA: Diagnosis present

## 2023-11-11 DIAGNOSIS — E1151 Type 2 diabetes mellitus with diabetic peripheral angiopathy without gangrene: Secondary | ICD-10-CM | POA: Diagnosis present

## 2023-11-11 DIAGNOSIS — I5042 Chronic combined systolic (congestive) and diastolic (congestive) heart failure: Secondary | ICD-10-CM | POA: Diagnosis present

## 2023-11-11 DIAGNOSIS — K08409 Partial loss of teeth, unspecified cause, unspecified class: Secondary | ICD-10-CM | POA: Diagnosis not present

## 2023-11-11 DIAGNOSIS — I6521 Occlusion and stenosis of right carotid artery: Secondary | ICD-10-CM | POA: Diagnosis present

## 2023-11-11 DIAGNOSIS — Y848 Other medical procedures as the cause of abnormal reaction of the patient, or of later complication, without mention of misadventure at the time of the procedure: Secondary | ICD-10-CM | POA: Diagnosis present

## 2023-11-11 DIAGNOSIS — N179 Acute kidney failure, unspecified: Secondary | ICD-10-CM | POA: Diagnosis present

## 2023-11-11 DIAGNOSIS — L03221 Cellulitis of neck: Secondary | ICD-10-CM | POA: Diagnosis present

## 2023-11-11 DIAGNOSIS — I2510 Atherosclerotic heart disease of native coronary artery without angina pectoris: Secondary | ICD-10-CM | POA: Diagnosis present

## 2023-11-11 DIAGNOSIS — D6832 Hemorrhagic disorder due to extrinsic circulating anticoagulants: Secondary | ICD-10-CM | POA: Diagnosis present

## 2023-11-11 DIAGNOSIS — K1379 Other lesions of oral mucosa: Secondary | ICD-10-CM | POA: Diagnosis not present

## 2023-11-11 DIAGNOSIS — E11319 Type 2 diabetes mellitus with unspecified diabetic retinopathy without macular edema: Secondary | ICD-10-CM | POA: Diagnosis present

## 2023-11-11 DIAGNOSIS — E8729 Other acidosis: Secondary | ICD-10-CM

## 2023-11-11 DIAGNOSIS — I130 Hypertensive heart and chronic kidney disease with heart failure and stage 1 through stage 4 chronic kidney disease, or unspecified chronic kidney disease: Secondary | ICD-10-CM | POA: Diagnosis present

## 2023-11-11 DIAGNOSIS — Z95820 Peripheral vascular angioplasty status with implants and grafts: Secondary | ICD-10-CM | POA: Diagnosis not present

## 2023-11-11 DIAGNOSIS — I959 Hypotension, unspecified: Secondary | ICD-10-CM | POA: Diagnosis present

## 2023-11-11 DIAGNOSIS — K91840 Postprocedural hemorrhage and hematoma of a digestive system organ or structure following a digestive system procedure: Secondary | ICD-10-CM | POA: Diagnosis present

## 2023-11-11 DIAGNOSIS — E861 Hypovolemia: Secondary | ICD-10-CM | POA: Diagnosis not present

## 2023-11-11 DIAGNOSIS — E785 Hyperlipidemia, unspecified: Secondary | ICD-10-CM | POA: Diagnosis present

## 2023-11-11 DIAGNOSIS — E1165 Type 2 diabetes mellitus with hyperglycemia: Secondary | ICD-10-CM | POA: Diagnosis present

## 2023-11-11 DIAGNOSIS — I480 Paroxysmal atrial fibrillation: Secondary | ICD-10-CM | POA: Diagnosis present

## 2023-11-11 DIAGNOSIS — E860 Dehydration: Secondary | ICD-10-CM | POA: Diagnosis present

## 2023-11-11 DIAGNOSIS — R571 Hypovolemic shock: Secondary | ICD-10-CM | POA: Diagnosis not present

## 2023-11-11 DIAGNOSIS — E1142 Type 2 diabetes mellitus with diabetic polyneuropathy: Secondary | ICD-10-CM | POA: Diagnosis present

## 2023-11-11 DIAGNOSIS — D62 Acute posthemorrhagic anemia: Secondary | ICD-10-CM

## 2023-11-11 DIAGNOSIS — I252 Old myocardial infarction: Secondary | ICD-10-CM | POA: Diagnosis not present

## 2023-11-11 DIAGNOSIS — E872 Acidosis, unspecified: Secondary | ICD-10-CM | POA: Diagnosis present

## 2023-11-11 DIAGNOSIS — N1831 Chronic kidney disease, stage 3a: Secondary | ICD-10-CM | POA: Diagnosis present

## 2023-11-11 DIAGNOSIS — K068 Other specified disorders of gingiva and edentulous alveolar ridge: Secondary | ICD-10-CM | POA: Diagnosis not present

## 2023-11-11 DIAGNOSIS — K219 Gastro-esophageal reflux disease without esophagitis: Secondary | ICD-10-CM | POA: Diagnosis present

## 2023-11-11 LAB — CBC
HCT: 22.2 % — ABNORMAL LOW (ref 36.0–46.0)
HCT: 26.2 % — ABNORMAL LOW (ref 36.0–46.0)
HCT: 21.5 % — ABNORMAL LOW (ref 36.0–46.0)
Hemoglobin: 7 g/dL — ABNORMAL LOW (ref 12.0–15.0)
Hemoglobin: 8.2 g/dL — ABNORMAL LOW (ref 12.0–15.0)
Hemoglobin: 7.1 g/dL — ABNORMAL LOW (ref 12.0–15.0)
MCH: 32.4 pg (ref 26.0–34.0)
MCH: 29.6 pg (ref 26.0–34.0)
MCH: 30 pg (ref 26.0–34.0)
MCHC: 31.5 g/dL (ref 30.0–36.0)
MCHC: 31.3 g/dL (ref 30.0–36.0)
MCHC: 33 g/dL (ref 30.0–36.0)
MCV: 102.8 fL — ABNORMAL HIGH (ref 80.0–100.0)
MCV: 94.6 fL (ref 80.0–100.0)
MCV: 90.7 fL (ref 80.0–100.0)
Platelets: 199 10*3/uL (ref 150–400)
Platelets: 217 10*3/uL (ref 150–400)
Platelets: 195 10*3/uL (ref 150–400)
RBC: 2.16 MIL/uL — ABNORMAL LOW (ref 3.87–5.11)
RBC: 2.77 MIL/uL — ABNORMAL LOW (ref 3.87–5.11)
RBC: 2.37 MIL/uL — ABNORMAL LOW (ref 3.87–5.11)
RDW: 15.2 % (ref 11.5–15.5)
RDW: 22.1 % — ABNORMAL HIGH (ref 11.5–15.5)
RDW: 22.3 % — ABNORMAL HIGH (ref 11.5–15.5)
WBC: 10.4 10*3/uL (ref 4.0–10.5)
WBC: 13 10*3/uL — ABNORMAL HIGH (ref 4.0–10.5)
WBC: 12 10*3/uL — ABNORMAL HIGH (ref 4.0–10.5)
nRBC: 0 % (ref 0.0–0.2)
nRBC: 0 % (ref 0.0–0.2)
nRBC: 0 % (ref 0.0–0.2)

## 2023-11-11 LAB — GLUCOSE, CAPILLARY
Glucose-Capillary: 126 mg/dL — ABNORMAL HIGH (ref 70–99)
Glucose-Capillary: 136 mg/dL — ABNORMAL HIGH (ref 70–99)
Glucose-Capillary: 169 mg/dL — ABNORMAL HIGH (ref 70–99)
Glucose-Capillary: 145 mg/dL — ABNORMAL HIGH (ref 70–99)

## 2023-11-11 LAB — LACTIC ACID, PLASMA
Lactic Acid, Venous: 1.1 mmol/L (ref 0.5–1.9)
Lactic Acid, Venous: 3 mmol/L (ref 0.5–1.9)

## 2023-11-11 LAB — PREPARE RBC (CROSSMATCH)

## 2023-11-11 LAB — BASIC METABOLIC PANEL
Anion gap: 13 (ref 5–15)
BUN: 39 mg/dL — ABNORMAL HIGH (ref 8–23)
CO2: 18 mmol/L — ABNORMAL LOW (ref 22–32)
Calcium: 8.1 mg/dL — ABNORMAL LOW (ref 8.9–10.3)
Chloride: 109 mmol/L (ref 98–111)
Creatinine, Ser: 2.36 mg/dL — ABNORMAL HIGH (ref 0.44–1.00)
GFR, Estimated: 20 mL/min — ABNORMAL LOW (ref >60)
Glucose, Bld: 155 mg/dL — ABNORMAL HIGH (ref 70–99)
Potassium: 3.7 mmol/L (ref 3.5–5.1)
Sodium: 140 mmol/L (ref 135–145)

## 2023-11-11 LAB — TROPONIN I (HIGH SENSITIVITY): Troponin I (High Sensitivity): 17 ng/L (ref <18)

## 2023-11-11 MED ORDER — SODIUM CHLORIDE 0.9 % IV SOLN: INTRAVENOUS | Status: AC

## 2023-11-11 MED ORDER — SODIUM CHLORIDE 0.9% IV SOLUTION: Freq: Once | INTRAVENOUS | Status: AC

## 2023-11-11 MED ORDER — SODIUM CHLORIDE 0.9 % IV SOLN: INTRAVENOUS | Status: DC

## 2023-11-11 MED ORDER — AMOXICILLIN-POT CLAVULANATE 500-125 MG PO TABS
1.0000 | ORAL_TABLET | Freq: Two times a day (BID) | ORAL | Status: DC
  Administered 2023-11-11 – 2023-11-16 (×11): 1 via ORAL
  Filled 2023-11-11 (×11): qty 1

## 2023-11-11 MED ORDER — INSULIN ASPART 100 UNIT/ML IJ SOLN
0.0000 [IU] | Freq: Three times a day (TID) | INTRAMUSCULAR | Status: DC
  Administered 2023-11-11: 1 [IU] via SUBCUTANEOUS
  Administered 2023-11-11: 2 [IU] via SUBCUTANEOUS
  Administered 2023-11-11: 1 [IU] via SUBCUTANEOUS
  Administered 2023-11-12 (×2): 2 [IU] via SUBCUTANEOUS
  Administered 2023-11-12: 3 [IU] via SUBCUTANEOUS
  Administered 2023-11-13 (×2): 2 [IU] via SUBCUTANEOUS
  Administered 2023-11-14 (×2): 1 [IU] via SUBCUTANEOUS
  Administered 2023-11-14 – 2023-11-15 (×3): 2 [IU] via SUBCUTANEOUS
  Administered 2023-11-15 – 2023-11-16 (×2): 1 [IU] via SUBCUTANEOUS

## 2023-11-11 MED ORDER — ALUM & MAG HYDROXIDE-SIMETH 200-200-20 MG/5ML PO SUSP
15.0000 mL | Freq: Four times a day (QID) | ORAL | Status: DC | PRN
  Administered 2023-11-11: 15 mL via ORAL
  Filled 2023-11-11: qty 30

## 2023-11-11 MED ORDER — ACETAMINOPHEN 325 MG PO TABS
650.0000 mg | ORAL_TABLET | Freq: Four times a day (QID) | ORAL | Status: DC | PRN
  Administered 2023-11-12 – 2023-11-13 (×4): 650 mg via ORAL
  Filled 2023-11-11 (×5): qty 2

## 2023-11-11 MED ORDER — OXYCODONE HCL 5 MG PO TABS
5.0000 mg | ORAL_TABLET | ORAL | Status: AC | PRN
  Administered 2023-11-11 – 2023-11-12 (×3): 5 mg via ORAL
  Filled 2023-11-11 (×3): qty 1

## 2023-11-11 NOTE — Progress Notes (Signed)
Hgb is 7.0. Patient is pale and continues to have some bleeding. Plan to transfuse 1 unit RBCs.

## 2023-11-11 NOTE — Assessment & Plan Note (Addendum)
-  Secondary to recent dental extraction of 6 teeth while on dual antiplatelet therapy and Eliquis - Will continue to hold all antiplatelet and anticoagulation -Continue compression with gauze -Hgb has downward trended from baseline of 12  in Oct to 8.3 on presentation. Denies other bleeding or melena prior to her dental procedure.  -Transfuse threshold of Hgb <7

## 2023-11-11 NOTE — Assessment & Plan Note (Signed)
S/p pacemaker

## 2023-11-11 NOTE — Assessment & Plan Note (Signed)
Prerenal AKI. - Will keep on continuous IV fluids and trend creatinine

## 2023-11-11 NOTE — Progress Notes (Signed)
[facility] PROGRESS NOTE  JASIYA CARLISLE (DOB: 13-Oct-1941) VFI:[id number] PCP: Fleet Contras, MD  Brief Narrative: Amanda Stewart is an 82 y.o. 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 She was found to be hypotensive, severely anemic with downward trending hgb to 7g/dl from baseline of 12 requiring RBC transfusion. Antiplatelet and anticoagulation medications are held.   Subjective: Eager to go home but still bleeding, still holding gauze in moth constantly. No other complaints. Aware of risk of stroke while holding medications. She's hungry and would like to eat.   Objective: BP 128/66   Pulse 88   Temp 98.4 F (36.9 C)   Resp 20   Ht 5\' 4"  (1.626 m)   Wt 71.6 kg   SpO2 99%   BMI 27.09 kg/m   Gen: No distress HEENT: Very slow oozing from extraction sites in right maxillary oral cavity with settling ecchymosis on hard palate. Edentulous at sites without recent extraction as well. Pulm: Clear, nonlabored  CV: RRR, no MRG or edema GI: Soft, NT, ND, +BS  Neuro: Alert and oriented. No new focal deficits. Ext: Warm, no deformities Skin: No rashes, lesions or ulcers on visualized skin   Assessment & Plan: Hypotension: Due to hemorrhage/anemia. Improved with volume resuscitation, expect will need blood product.  - Hold home norvasc, losartan, metoprolol pending improvement and formal med rec. - Remain on PCU given her frailty/age and severe anemia.    Acute blood loss anemia: Due to dental extraction bleeding.  - Despite significant risks of holding anticoagulation/DAPT, we must do so at this time for transfusion-dependent anemia/hemodynamically significant bleeding. Will restart these as soon as bleeding has subsided, presumably clot formation enough underway to prevent rebleeding.  - To avoid malnutrition  and due to uncertainty on duration of bleeding, cortrak tube was offered but declined. So, though it may delay healing, we will give clear liquid diet for now.  - Continue compression with gauze - Serial CBC, follow up hgb after transfusion.    Increased anion gap metabolic acidosis: Improving slowly.  -secondary to AKI, dehydration -continue IV fluid hydration   AKI on stage IIIa CKD:  - Remain on IVF and monitor renal parameters, avoid nephrotoxins.  - Further work up if not responding to therapy.    PAF, CHB s/p PPM:  - With hypotension and current NSR, will hold meds that can lower BP and continue to hold DOAC with ongoing bleeding, hypotension and severe anemia.   PAD, carotid stenosis, hx CVA: s/p R TCAR for symptomatic carotid stenosis in October - She is to remain on DAPT until January but we need to hold for now.    T2DM: Last HbA1c 10.1%.  - Continue SSI  - Hold empagliflozin with AKI  Tyrone Nine, MD [facility] [email or web address] 11/11/2023, 9:01 AM

## 2023-11-11 NOTE — Assessment & Plan Note (Addendum)
-  secondary to ongoing oral/gum bleed from recent dental extraction -continue compression with gauze -Continue IV fluid hydration - Hold home antihypertensives

## 2023-11-11 NOTE — Plan of Care (Signed)
  Problem: Education: Goal: Knowledge of discharge needs will improve Outcome: Progressing   Problem: Respiratory: Goal: Ability to achieve and maintain a regular respiratory rate will improve Outcome: Progressing   Problem: Coping: Goal: Ability to adjust to condition or change in health will improve Outcome: Progressing   Problem: Metabolic: Goal: Ability to maintain appropriate glucose levels will improve Outcome: Progressing   Problem: Clinical Measurements: Goal: Cardiovascular complication will be avoided Outcome: Progressing   Problem: Activity: Goal: Risk for activity intolerance will decrease Outcome: Progressing   Problem: Coping: Goal: Level of anxiety will decrease Outcome: Progressing   Problem: Elimination: Goal: Will not experience complications related to urinary retention Outcome: Progressing   Problem: Fluid Volume: Goal: Ability to maintain a balanced intake and output will improve Outcome: Not Progressing   Problem: Health Behavior/Discharge Planning: Goal: Ability to manage health-related needs will improve Outcome: Not Progressing   Problem: Nutritional: Goal: Maintenance of adequate nutrition will improve Outcome: Not Progressing

## 2023-11-11 NOTE — Progress Notes (Addendum)
Date and time results received: 11/11/23 1415  Test: Lactic Acid Critical Value: 3.0  Name of Provider Notified: Dr. Jarvis Newcomer  Orders Received? Or Actions Taken?: Awaiting orders

## 2023-11-11 NOTE — Assessment & Plan Note (Signed)
-  secondary to AKI, dehydration -continue IV fluid hydration

## 2023-11-11 NOTE — Assessment & Plan Note (Signed)
-   Based on ED report does not appear that patient had her anticoagulation held prior to procedure.  Unable to reach both her daughters to verify.  Will need to continue to hold Eliquis with ongoing bleeding, hypotension and severe anemia.

## 2023-11-11 NOTE — Plan of Care (Signed)
  Problem: Education: Goal: Knowledge of discharge needs will improve Outcome: Progressing   Problem: Respiratory: Goal: Ability to achieve and maintain a regular respiratory rate will improve Outcome: Progressing   Problem: Coping: Goal: Ability to adjust to condition or change in health will improve Outcome: Progressing   Problem: Skin Integrity: Goal: Risk for impaired skin integrity will decrease Outcome: Progressing   Problem: Health Behavior/Discharge Planning: Goal: Ability to manage health-related needs will improve Outcome: Not Progressing   Problem: Nutritional: Goal: Maintenance of adequate nutrition will improve Outcome: Not Progressing

## 2023-11-11 NOTE — Assessment & Plan Note (Addendum)
-   Uncontrolled with last A1c of 10.1 in September - Placed on sliding scale insulin

## 2023-11-11 NOTE — Assessment & Plan Note (Signed)
-  s/p R TCAR for symptomatic carotid stenosis in October -she is to remain on DAPT until January but unfortunately it does not appear that any of her antiplatelets or anticoagulation was held prior to dental procedure. -Will need to hold aspirin and Plavix due to ongoing bleeding with hypotension and significant blood loss anemia

## 2023-11-12 DIAGNOSIS — E861 Hypovolemia: Secondary | ICD-10-CM | POA: Diagnosis not present

## 2023-11-12 LAB — CBC
HCT: 20.4 % — ABNORMAL LOW (ref 36.0–46.0)
Hemoglobin: 6.4 g/dL — CL (ref 12.0–15.0)
MCH: 29.6 pg (ref 26.0–34.0)
MCHC: 31.4 g/dL (ref 30.0–36.0)
MCV: 94.4 fL (ref 80.0–100.0)
Platelets: 193 10*3/uL (ref 150–400)
RBC: 2.16 MIL/uL — ABNORMAL LOW (ref 3.87–5.11)
RDW: 22.3 % — ABNORMAL HIGH (ref 11.5–15.5)
WBC: 9.7 10*3/uL (ref 4.0–10.5)
nRBC: 0 % (ref 0.0–0.2)

## 2023-11-12 LAB — GLUCOSE, CAPILLARY
Glucose-Capillary: 169 mg/dL — ABNORMAL HIGH (ref 70–99)
Glucose-Capillary: 158 mg/dL — ABNORMAL HIGH (ref 70–99)
Glucose-Capillary: 261 mg/dL — ABNORMAL HIGH (ref 70–99)
Glucose-Capillary: 190 mg/dL — ABNORMAL HIGH (ref 70–99)

## 2023-11-12 LAB — BASIC METABOLIC PANEL
Anion gap: 4 — ABNORMAL LOW (ref 5–15)
BUN: 22 mg/dL (ref 8–23)
CO2: 22 mmol/L (ref 22–32)
Calcium: 8.2 mg/dL — ABNORMAL LOW (ref 8.9–10.3)
Chloride: 115 mmol/L — ABNORMAL HIGH (ref 98–111)
Creatinine, Ser: 1.48 mg/dL — ABNORMAL HIGH (ref 0.44–1.00)
GFR, Estimated: 35 mL/min — ABNORMAL LOW (ref >60)
Glucose, Bld: 167 mg/dL — ABNORMAL HIGH (ref 70–99)
Potassium: 3.6 mmol/L (ref 3.5–5.1)
Sodium: 141 mmol/L (ref 135–145)

## 2023-11-12 LAB — PREPARE RBC (CROSSMATCH)

## 2023-11-12 LAB — HEMOGLOBIN AND HEMATOCRIT, BLOOD
HCT: 27.5 % — ABNORMAL LOW (ref 36.0–46.0)
Hemoglobin: 8.9 g/dL — ABNORMAL LOW (ref 12.0–15.0)

## 2023-11-12 LAB — PROTIME-INR
INR: 1.3 — ABNORMAL HIGH (ref 0.8–1.2)
Prothrombin Time: 16 s — ABNORMAL HIGH (ref 11.4–15.2)

## 2023-11-12 MED ORDER — TRANEXAMIC ACID 5% ORAL SOLUTION
10.0000 mL | Freq: Once | ORAL | Status: AC
  Administered 2023-11-12: 10 mL via OROMUCOSAL
  Filled 2023-11-12: qty 10

## 2023-11-12 MED ORDER — SODIUM CHLORIDE 0.9% IV SOLUTION: Freq: Once | INTRAVENOUS | Status: AC

## 2023-11-12 MED ORDER — PROCHLORPERAZINE EDISYLATE 10 MG/2ML IJ SOLN: 5.0000 mg | Freq: Four times a day (QID) | INTRAMUSCULAR | Status: DC | PRN

## 2023-11-12 MED ORDER — KCL IN DEXTROSE-NACL 10-5-0.45 MEQ/L-%-% IV SOLN
INTRAVENOUS | Status: AC
  Filled 2023-11-12 (×2): qty 1000

## 2023-11-12 MED ORDER — ONDANSETRON HCL 4 MG/2ML IJ SOLN: 4.0000 mg | Freq: Four times a day (QID) | INTRAMUSCULAR | Status: DC | PRN

## 2023-11-12 NOTE — Evaluation (Signed)
Physical Therapy Evaluation Patient Details Name: Amanda Stewart MRN: [id number] DOB: 10-02-1941 Today's Date: 11/12/2023  History of Present Illness  81 y.o. female admitted 11/10/23 with continued bleeding after dental extractions; workup for anemia, hypotension. Pt with swelling/tenderness lower face/neck; plan for CT neck and orthopantogram to r/o deep space hematoma/abscess. 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   Clinical Impression  Pt presents with an overall decrease in functional mobility secondary to above. PTA, pt reports living alone, mod indep with RW, daughter assists with iADLs PRN. Today, pt requiring minA for bed mobility with poor standing tolerance to take steps to recliner; pt with multiple bouts of instability and c/o persistent dizziness though VSS. Pt able to talk but opts to use head nods/gestures to communicate during session; when asked if painful, pt does not specify why she prefers not to talk. If family unable to provide necessary assist upon return home, recommend post-acute rehab (< 3 hrs/day) to maximize functional mobility and independence prior to d/c home pending activity progression.   Orthostatic BPs Sitting 124/54  Sitting 2-min 111/72  Standing 137/70  Post-standing transfer 121/67   HR 108, SpO2 98% on RA    If plan is discharge home, recommend the following: A little help with walking and/or transfers;A little help with bathing/dressing/bathroom;Assistance with cooking/housework;Direct supervision/assist for medications management;Direct supervision/assist for financial management;Assist for transportation;Help with stairs or ramp for entrance   Can travel by private vehicle   Yes    Equipment Recommendations  (TBD)  Recommendations for Other Services   Occupational Therapist; Mobility Specialist    Functional Status Assessment Patient has had a recent decline in their functional  status and demonstrates the ability to make significant improvements in function in a reasonable and predictable amount of time.     Precautions / Restrictions Precautions Precautions: Fall;Other (comment) Precaution Comments: c/o dizziness with sitting (BP stable with all transitions 11/12/23) Restrictions Weight Bearing Restrictions Per Provider Order: No      Mobility  Bed Mobility Overal bed mobility: Needs Assistance Bed Mobility: Supine to Sit     Supine to sit: Min assist     General bed mobility comments: minA for HHA to elevate trunk, cues to complete task    Transfers Overall transfer level: Needs assistance Equipment used: Rolling walker (2 wheels) Transfers: Sit to/from Stand, Bed to chair/wheelchair/BSC Sit to Stand: Min assist   Step pivot transfers: Min assist       General transfer comment: pt pulling on RW despite cues for hand placement; minA for walker management and stability with pivotal steps to recliner as pt with BUE/BLE shakiness and c/o dizziness    Ambulation/Gait                  Stairs            Wheelchair Mobility     Tilt Bed    Modified Rankin (Stroke Patients Only)       Balance Overall balance assessment: Needs assistance Sitting-balance support: No upper extremity supported, Feet supported Sitting balance-Leahy Scale: Fair Sitting balance - Comments: pt with intermittent periods of shakiness while at edge of bed, 1x posterior LOB with bout of trunk/BLE extension requiring assist to prevent fall backwards, pt could not explain what happened   Standing balance support: Reliant on assistive device for balance Standing balance-Leahy Scale: Poor  Pertinent Vitals/Pain Pain Assessment Pain Assessment: Faces Faces Pain Scale: Hurts a little bit Pain Location: does not specify Pain Intervention(s): Monitored during session    Home Living Family/patient expects to be  discharged to:: Private residence Living Arrangements: Alone Available Help at Discharge: Family;Available PRN/intermittently Type of Home: Apartment Home Access: Elevator       Home Layout: One level Home Equipment: Rolling Walker (2 wheels);Grab bars - toilet;Lift chair;Other (comment) Additional Comments: pt d/c to [facility] after admit 08/2023, but reports being back home alone since then. pt not forthcoming with info since opting not to talk majority of session despite clear voice (opting to use gestures and nod yes/no)    Prior Function Prior Level of Function : Independent/Modified Independent;History of Falls (last six months)             Mobility Comments: reports mod indep with RW ADLs Comments: reports mod indep, including cooking and cleaning; daughter assists some with household tasks and med management     Extremity/Trunk Assessment   Upper Extremity Assessment Upper Extremity Assessment: Generalized weakness    Lower Extremity Assessment Lower Extremity Assessment: Generalized weakness       Communication   Communication Communication: Difficulty communicating thoughts/reduced clarity of speech;Hearing impairment (wears hearing aids; choosing not to talk majority of session)  Cognition Arousal: Alert Behavior During Therapy: Flat affect Overall Cognitive Status: No family/caregiver present to determine baseline cognitive functioning                                 General Comments: difficult to determine since pt opting not to talk majority of session though she is able to speak clearly, does not give reason why when asked if painful, etc. - able to make needs known pointing and nodding yes/no. when asked why isn't talking, pt states, "What do you want from me? I don't have my hearing aids." very focused on checking and replacing guaze in her mouth for bleeding        General Comments General comments (skin integrity, edema, etc.): educ  re: POC, activity recommendations, importance of OOB mobility, importance of trying to eat something, discharge needs. initiated discussion about potential need for rehab  before return home, but pt not engaging in discussion    Exercises     Assessment/Plan    PT Assessment Patient needs continued PT services  PT Problem List Decreased strength;Decreased activity tolerance;Decreased balance;Decreased mobility;Decreased cognition;Decreased safety awareness;Cardiopulmonary status limiting activity;Pain       PT Treatment Interventions DME instruction;Gait training;Functional mobility training;Therapeutic activities;Therapeutic exercise;Balance training;Patient/family education    PT Goals (Current goals can be found in the Care Plan section)  Acute Rehab PT Goals Patient Stated Goal: for bleeding to stop PT Goal Formulation: With patient Time For Goal Achievement: 11/26/23 Potential to Achieve Goals: Good    Frequency Min 1X/week     Co-evaluation               AM-PAC PT "6 Clicks" Mobility  Outcome Measure Help needed turning from your back to your side while in a flat bed without using bedrails?: A Little Help needed moving from lying on your back to sitting on the side of a flat bed without using bedrails?: A Little Help needed moving to and from a bed to a chair (including a wheelchair)?: A Little Help needed standing up from a chair using your arms (e.g., wheelchair or bedside chair)?: A  Little Help needed to walk in hospital room?: Total Help needed climbing 3-5 steps with a railing? : Total 6 Click Score: 14    End of Session Equipment Utilized During Treatment: Gait belt Activity Tolerance: Patient tolerated treatment well;Patient limited by fatigue Patient left: in chair;with call bell/phone within reach Nurse Communication: Mobility status;Other (comment) (pt needs chair alarm) PT Visit Diagnosis: Other abnormalities of gait and mobility (R26.89);Muscle  weakness (generalized) (M62.81)    Time: 4098-1191 PT Time Calculation (min) (ACUTE ONLY): 28 min   Charges:   PT Evaluation $PT Eval Moderate Complexity: 1 Mod PT Treatments $Therapeutic Activity: 8-22 mins PT General Charges $$ ACUTE PT VISIT: 1 Visit       Amanda Stewart, PT, DPT Acute Rehabilitation Services  Personal: Secure Chat Rehab Office: [phone]  Amanda Stewart 11/12/2023, 3:45 PM

## 2023-11-12 NOTE — Progress Notes (Signed)
Hgb down to 6.4 this morning. Plan to transfuse 1 unit RBCs.

## 2023-11-12 NOTE — Progress Notes (Signed)
[facility] PROGRESS NOTE  Amanda Stewart (DOB: [birth year]) YQI:[id number] PCP: Fleet Contras, MD  Brief Narrative: Amanda Stewart is an 82 y.o. 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 She was found to be hypotensive, severely anemic with downward trending hgb to 7g/dl from baseline of 12 requiring RBC transfusion. Antiplatelet and anticoagulation medications are held.   Subjective: Had an episode of diarrhea overnight, but denies any this morning. No abd pain, dysuria. Denies dyspnea. Still oozing blood with little change from yesterday.   Objective: BP (!) 103/54   Pulse 95   Temp 98.2 F (36.8 C) (Axillary)   Resp 19   Ht 5\' 4"  (1.626 m)   Wt 71.6 kg   SpO2 97%   BMI 27.09 kg/m   Gen: Elderly female in no distress HEENT: Stable evidence of recent oozing from single lower and several upper right sided dental extraction sites. Surrounding ecchymosis noted, stable, settling along hard palate. Neck is not stiff, but tender to palpation on right without palpable fluctuance (exam limited by pain) Pulm: Clear, nonlabored  CV: RRR, paced rhythm, no MRG or edema GI: Soft, NT, ND, +BS  Neuro: Alert and oriented. Stable HOH. No new focal deficits. Ext: Warm, no deformities. Skin: No other/new rashes, lesions or ulcers on visualized skin   Assessment & Plan: Hypotension: Due to hemorrhage/anemia. Improved with volume resuscitation/transfusion. - Hold home norvasc, losartan, metoprolol pending improvement - Remain on PCU given her frailty/age and severe anemia.    Acute blood loss anemia: Due to dental extraction bleeding.  - Despite significant risks of holding anticoagulation/DAPT, we must do so at this time for transfusion-dependent anemia/hemodynamically significant bleeding. Will restart these as soon as  bleeding has subsided, presumably clot formation enough underway to prevent rebleeding.  - To avoid malnutrition and due to uncertainty on duration of bleeding, cortrak tube was offered but declined. So, though it may delay healing, we will give clear liquid diet for now.  - Continue compression with gauze. Platelets remain wnl. Avoid NSAIDs.  - Serial CBC, follow up hgb after transfusion.  - Called the dental offices of Dr. Mayford Knife, then Dr. Chales Salmon, neither of whom are on call to see inpatients. There is no coverage at this time. Will check CT neck and orthopantogram to r/o deep space hematoma/abscess (given her degree of pain today).    Leukocytosis: No urinary symptoms or respiratory symptoms. WBC improved, remains afebrile but does have significant tenderness in lower face/neck and settling ecchymosis on lower neck.  - CT neck, orthopantogram, given her pacemaker and need to avoid bacteremia with ongoing open oral wounds, will cover with augmentin.   Increased anion gap metabolic acidosis: Resolved. - Secondary to AKI, dehydration    AKI on stage IIIa CKD:  - Improving, will remain on IVF and monitor renal parameters, avoid nephrotoxins.  - Further work up if not responding to therapy.    PAF, CHB s/p PPM:  - With hypotension, will hold meds that can lower BP. Also continue to hold DOAC with ongoing bleeding, hypotension and severe anemia.   PAD, carotid stenosis, hx CVA: s/p R TCAR for symptomatic carotid stenosis in October - She is to remain on DAPT until January but we need to hold for now.  - Source of bleeding is obviously extraction sites, though the level  of her tenderness on right neck makes me feel the need to scan, as discussed above.    T2DM: Last HbA1c 10.1%.  - Continue SSI  - Hold empagliflozin with AKI  Tyrone Nine, MD [facility] [email or web address] 11/12/2023, 11:26 AM

## 2023-11-13 ENCOUNTER — Inpatient Hospital Stay (HOSPITAL_COMMUNITY): Payer: 59

## 2023-11-13 DIAGNOSIS — E861 Hypovolemia: Secondary | ICD-10-CM | POA: Diagnosis not present

## 2023-11-13 LAB — TYPE AND SCREEN
ABO/RH(D): O POS
Antibody Screen: NEGATIVE
Unit division: 0
Unit division: 0

## 2023-11-13 LAB — BPAM RBC
Blood Product Expiration Date: 202412292359
Blood Product Expiration Date: 202501012359
ISSUE DATE / TIME: 202412150512
ISSUE DATE / TIME: 202412160646
Unit Type and Rh: 5100
Unit Type and Rh: 5100

## 2023-11-13 LAB — CBC
HCT: 23.3 % — ABNORMAL LOW (ref 36.0–46.0)
Hemoglobin: 7.7 g/dL — ABNORMAL LOW (ref 12.0–15.0)
MCH: 30.7 pg (ref 26.0–34.0)
MCHC: 33 g/dL (ref 30.0–36.0)
MCV: 92.8 fL (ref 80.0–100.0)
Platelets: 181 10*3/uL (ref 150–400)
RBC: 2.51 MIL/uL — ABNORMAL LOW (ref 3.87–5.11)
RDW: 19.5 % — ABNORMAL HIGH (ref 11.5–15.5)
WBC: 8.6 10*3/uL (ref 4.0–10.5)
nRBC: 0 % (ref 0.0–0.2)

## 2023-11-13 LAB — GLUCOSE, CAPILLARY
Glucose-Capillary: 166 mg/dL — ABNORMAL HIGH (ref 70–99)
Glucose-Capillary: 122 mg/dL — ABNORMAL HIGH (ref 70–99)
Glucose-Capillary: 198 mg/dL — ABNORMAL HIGH (ref 70–99)
Glucose-Capillary: 140 mg/dL — ABNORMAL HIGH (ref 70–99)

## 2023-11-13 MED ORDER — FENTANYL CITRATE PF 50 MCG/ML IJ SOSY
12.5000 ug | PREFILLED_SYRINGE | Freq: Once | INTRAMUSCULAR | Status: AC
  Administered 2023-11-13: 12.5 ug via INTRAVENOUS
  Filled 2023-11-13: qty 1

## 2023-11-13 MED ORDER — OXYCODONE HCL 5 MG PO TABS
5.0000 mg | ORAL_TABLET | ORAL | Status: AC | PRN
  Administered 2023-11-13 – 2023-11-15 (×3): 5 mg via ORAL
  Filled 2023-11-13 (×3): qty 1

## 2023-11-13 NOTE — TOC Initial Note (Signed)
Transition of Care [facility]) - Initial/Assessment Note    Patient Details  Name: Amanda Stewart MRN: [id number] Date of Birth: May 28, 1941  [facility] CM/SW Contact:    Eduard Roux, LCSW Phone Number: 11/13/2023, 1:02 PM  Clinical Narrative:                  9:55 am- CSW met with patient at bedside. CSW introduced self and explained role. CSW discussed with patient recommendation of short term rehab at [facility]. Patient states she was agreeable but requested CSW contact her daughter,Andrea.   12:23 pm- CSW spoke with patient's daughter,Andrea- informed of current recommendations for SNF. She states patient was recently at [facility] and was discharged in  mid October. She reports,  HH services recently ended w/ Frances Furbish (PT/OT speech). She states patient has not had 60 day wellness and would be in co-pay status. She states the patient has support of herself and patient's other daughter, Marina Gravel and is comfortable with the patient returning home.   CM updated TOC will continue to follow and assist with discharge planning.   Antony Blackbird, MSW, LCSW  Clinical Social Worker      Barriers to Discharge: Continued Medical Work up   Patient Goals and CMS Choice            Expected Discharge Plan and Services In-house Referral: Clinical Social Work     Living arrangements for the past 2 months: Single Family Home                                      Prior Living Arrangements/Services Living arrangements for the past 2 months: Single Family Home Lives with:: Self Patient language and need for interpreter reviewed:: No        Need for Family Participation in Patient Care: Yes (Comment) Care giver support system in place?: Yes (comment)   Criminal Activity/Legal Involvement Pertinent to Current Situation/Hospitalization: No - Comment as needed  Activities of Daily Living   ADL Screening (condition at time of admission) Independently performs ADLs?: Yes  (appropriate for developmental age) Is the patient deaf or have difficulty hearing?: Yes Does the patient have difficulty seeing, even when wearing glasses/contacts?: No Does the patient have difficulty concentrating, remembering, or making decisions?: Yes  Permission Sought/Granted Permission sought to share information with : Family Supports Permission granted to share information with : Yes, Verbal Permission Granted  Share Information with NAME: Carmell Austria     Permission granted to share info w Relationship: daughter  Permission granted to share info w Contact Information: [phone]  Emotional Assessment Appearance:: Appears stated age Attitude/Demeanor/Rapport: Engaged Affect (typically observed): Appropriate Orientation: : Oriented to Self, Oriented to Place, Oriented to  Time, Oriented to Situation Alcohol / Substance Use: Not Applicable Psych Involvement: No (comment)  Admission diagnosis:  AKI (acute kidney injury) (HCC) [N17.9] Hypotension [I95.9] Anemia, unspecified type [D64.9] Patient Active Problem List   Diagnosis Date Noted   Acute blood loss anemia 11/11/2023   Increased anion gap metabolic acidosis 11/11/2023   AKI (acute kidney injury) (HCC) 11/10/2023   Paroxysmal atrial fibrillation (HCC) 10/05/2023   Hypercoagulable state due to paroxysmal atrial fibrillation (HCC) 10/05/2023   Stroke (HCC) 08/29/2023   Physical deconditioning 08/12/2023   Hypotension 08/11/2023   History of COVID-19 08/11/2023   Chronic kidney disease, stage 3a (HCC) 08/11/2023   Hypertension associated with diabetes (HCC) 08/11/2023  Hyperlipidemia associated with type 2 diabetes mellitus (HCC) 08/11/2023   PAD (peripheral artery disease) (HCC) 08/11/2023   Pacemaker 11/23/2021   Complete heart block (HCC) 08/07/2021   Hyperkalemia 11/04/2015   Rhabdomyolysis 11/04/2015   CAD (coronary artery disease) 11/04/2015   Transaminitis 11/04/2015   Diabetes mellitus, type 2 (HCC)  11/04/2015   Chest pain 12/10/2014   Chest pain at rest 12/10/2014   Cardiac arrest (HCC) 05/24/2014   Acute respiratory failure with hypoxia (HCC) 05/24/2014   Acute renal insufficiency 05/24/2014   PCP:  Fleet Contras, MD Pharmacy:   [facility] [phone] - , [place] - [address] AT [facility] [address] & [address] [place] [id number] Phone: [phone] Fax: [phone]     Social Drivers of Health (SDOH) Social History: SDOH Screenings   Food Insecurity: No Food Insecurity (11/11/2023)  Housing: Low Risk  (11/11/2023)  Transportation Needs: No Transportation Needs (11/11/2023)  Utilities: Not At Risk (11/11/2023)  Tobacco Use: Low Risk  (11/10/2023)   SDOH Interventions:     Readmission Risk Interventions     No data to display

## 2023-11-13 NOTE — Progress Notes (Signed)
  Patient is complaining about severe dental pain which is not managing with Tylenol.  Earlier today oxycodone has been discontinued as patient was sleepy and lethargy with oxycodone.  Giving fentanyl 12.5 mg one-time dose.

## 2023-11-13 NOTE — Progress Notes (Signed)
Mobility Specialist Progress Note:   11/13/23 1603  Mobility  Activity Ambulated with assistance in room  Level of Assistance Contact guard assist, steadying assist  Assistive Device Front wheel walker  Distance Ambulated (ft) 15 ft  Activity Response Tolerated well  Mobility Referral Yes  Mobility visit 1 Mobility  Mobility Specialist Start Time (ACUTE ONLY) 1515  Mobility Specialist Stop Time (ACUTE ONLY) 1535  Mobility Specialist Time Calculation (min) (ACUTE ONLY) 20 min   Pre Mobility: 104 HR  During Mobility: 145 HR  Post Mobility: 110 HR , 126/69 BP  Pt received in bed, agreeable to mobility. Pt found to have blood leaking from IV. RN notified. Pt was able to ambulate around bed while RN assisting with linen change. Pt required seated rest break d/t dizziness. No unsteadiness or LOB present. HR peaked at 145 bpm. BP stable. Pt returned to bed with VSS and RN still present in room.   Leory Plowman  Mobility Specialist Please contact via [employer] office at [phone]

## 2023-11-13 NOTE — Evaluation (Signed)
Occupational Therapy Evaluation Patient Details Name: Amanda Stewart MRN: [id number] DOB: 08/11/1941 Today's Date: 11/13/2023   History of Present Illness 82 y.o. female admitted 11/10/23 with continued bleeding after dental extractions; workup for anemia, hypotension. Pt with swelling/tenderness lower face/neck; plan for CT neck and orthopantogram to r/o deep space hematoma/abscess. 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   Clinical Impression   PTA, pt lives alone, typically Modified Independent with ADLs, some IADLs and mobility using RW. Family assists with med mgmt and community IADLs. Pt presents now with deficits in strength, standing balance, endurance and cognition (? Difficult to fully assess with minimal verbalizations). Overall, pt requires Min A for [facility] transfers using RW and Min A for LB ADLs. Unable to further assess mobility given IV attached to bedframe. Pt endorses baseline orthostatic hypotension at home; noted systolic drop from 140 to 118 during session today. Patient will benefit from continued inpatient follow up therapy, <3 hours/day unless able to progress quickly at an acute level to return home at DC.      If plan is discharge home, recommend the following: A little help with walking and/or transfers;A little help with bathing/dressing/bathroom;Assistance with cooking/housework    Functional Status Assessment  Patient has had a recent decline in their functional status and demonstrates the ability to make significant improvements in function in a reasonable and predictable amount of time.  Equipment Recommendations  None recommended by OT    Recommendations for Other Services       Precautions / Restrictions Precautions Precautions: Fall;Other (comment) Precaution Comments: dizziness with standing (pt reports baseline orthostasis) Restrictions Weight Bearing Restrictions Per Provider Order: No       Mobility Bed Mobility Overal bed mobility: Needs Assistance Bed Mobility: Supine to Sit, Sit to Supine     Supine to sit: Min assist Sit to supine: Contact guard assist   General bed mobility comments: Min A to scoot to EOB and CGA to ensure LE back to bed safely    Transfers Overall transfer level: Needs assistance Equipment used: Rolling walker (2 wheels) Transfers: Sit to/from Stand, Bed to chair/wheelchair/BSC Sit to Stand: Min assist     Step pivot transfers: Min assist     General transfer comment: Assist needed to stand due to tendency to pull on RW. Transfer to [facility] without AD then use of RW back to bed. Unable to progress mobility due to running IV attached to bed rather than pole.      Balance Overall balance assessment: Needs assistance Sitting-balance support: No upper extremity supported, Feet supported Sitting balance-Leahy Scale: Fair     Standing balance support: Reliant on assistive device for balance, Bilateral upper extremity supported, Single extremity supported Standing balance-Leahy Scale: Poor                             ADL either performed or assessed with clinical judgement   ADL Overall ADL's : Needs assistance/impaired Eating/Feeding: Set up;Sitting   Grooming: Set up;Sitting   Upper Body Bathing: Set up;Sitting   Lower Body Bathing: Sitting/lateral leans;Sit to/from stand;Minimal assistance   Upper Body Dressing : Set up;Sitting   Lower Body Dressing: Minimal assistance;Sitting/lateral leans;Sit to/from stand   Toilet Transfer: Minimal assistance;Stand-pivot;BSC/3in1;Rolling walker (2 wheels) Toilet Transfer Details (indicate cue type and reason): no RW to [facility] with handheld assist + swinging hips to [facility] needed. Use of RW  back to bed from [facility] improved stability. Toileting- Clothing Manipulation and Hygiene: Minimal assistance;Sitting/lateral lean;Sit to/from stand Toileting - Clothing Manipulation Details (indicate cue  type and reason): assist for clothing mgmt and posterior hygiene. Pt able to perform anterior hygiene in standing             Vision Ability to See in Adequate Light: 0 Adequate Patient Visual Report: No change from baseline Vision Assessment?: No apparent visual deficits     Perception         Praxis         Pertinent Vitals/Pain Pain Assessment Pain Assessment: Faces Faces Pain Scale: Hurts a little bit Pain Location: mouth Pain Descriptors / Indicators: Sore Pain Intervention(s): Monitored during session     Extremity/Trunk Assessment Upper Extremity Assessment Upper Extremity Assessment: Generalized weakness;Right hand dominant   Lower Extremity Assessment Lower Extremity Assessment: Defer to PT evaluation   Cervical / Trunk Assessment Cervical / Trunk Assessment: Normal   Communication Communication Communication: Difficulty communicating thoughts/reduced clarity of speech;Hearing impairment Cueing Techniques: Verbal cues;Gestural cues   Cognition Arousal: Alert Behavior During Therapy: Flat affect Overall Cognitive Status: No family/caregiver present to determine baseline cognitive functioning                                 General Comments: pleasant though flat affect. follows directions and expresses needs. difficult to fully determine due to minimal verbalizations. pt reports not talking much due to dry mouth when asked if it was due to pain.     General Comments  Pt reports baseline dizziness in standing due to BP drops at home- reports she just "sits down" when it happens. Systolic BP 140 at rest to 118 with activity; dizziness felt and rest break needed    Exercises     Shoulder Instructions      Home Living Family/patient expects to be discharged to:: Private residence Living Arrangements: Alone Available Help at Discharge: Family;Available PRN/intermittently Type of Home: Apartment Home Access: Elevator     Home Layout: One  level     Bathroom Shower/Tub: Chief Strategy Officer: Standard     Home Equipment: Agricultural consultant (2 wheels);Grab bars - toilet;Lift chair;Other (comment)   Additional Comments: pt d/c to [facility] after admit 08/2023, but reports being back home alone since then.      Prior Functioning/Environment Prior Level of Function : Independent/Modified Independent;History of Falls (last six months)             Mobility Comments: reports mod indep with RW ADLs Comments: reports Mod I for ADLs, cooks at home. Daughters assist with meds, grocery shopping        OT Problem List: Decreased strength;Decreased activity tolerance;Impaired balance (sitting and/or standing);Decreased cognition;Decreased knowledge of use of DME or AE;Pain      OT Treatment/Interventions: Self-care/ADL training;Therapeutic exercise;Energy conservation;DME and/or AE instruction;Therapeutic activities;Balance training;Patient/family education    OT Goals(Current goals can be found in the care plan section) Acute Rehab OT Goals Patient Stated Goal: for mouth to feel better OT Goal Formulation: With patient Time For Goal Achievement: 11/27/23 Potential to Achieve Goals: Good  OT Frequency: Min 1X/week    Co-evaluation              AM-PAC OT "6 Clicks" Daily Activity     Outcome Measure Help from another person eating meals?: A Little Help from another person taking care of personal grooming?:  A Little Help from another person toileting, which includes using toliet, bedpan, or urinal?: A Little Help from another person bathing (including washing, rinsing, drying)?: A Little Help from another person to put on and taking off regular upper body clothing?: A Little Help from another person to put on and taking off regular lower body clothing?: A Little 6 Click Score: 18   End of Session Equipment Utilized During Treatment: Rolling walker (2 wheels) Nurse Communication: Mobility  status;Other (comment) (BP)  Activity Tolerance: Patient tolerated treatment well Patient left: in bed;with call bell/phone within reach;with bed alarm set  OT Visit Diagnosis: Unsteadiness on feet (R26.81);Other abnormalities of gait and mobility (R26.89);Muscle weakness (generalized) (M62.81)                Time: 3086-5784 OT Time Calculation (min): 24 min Charges:  OT General Charges $OT Visit: 1 Visit OT Evaluation $OT Eval Low Complexity: 1 Low OT Treatments $Self Care/Home Management : 8-22 mins  Bradd Canary, OTR/L Acute Rehab Services Office: [phone]   Lorre Munroe 11/13/2023, 11:00 AM

## 2023-11-13 NOTE — Plan of Care (Signed)
  Problem: Education: Goal: Knowledge of General Education information will improve Description: Including pain rating scale, medication(s)/side effects and non-pharmacologic comfort measures Outcome: Progressing   Problem: Health Behavior/Discharge Planning: Goal: Ability to manage health-related needs will improve Outcome: Progressing   Problem: Clinical Measurements: Goal: Will remain free from infection Outcome: Progressing   Problem: Nutrition: Goal: Adequate nutrition will be maintained Outcome: Progressing   Problem: Pain Management: Goal: General experience of comfort will improve Outcome: Progressing   Problem: Safety: Goal: Ability to remain free from injury will improve Outcome: Progressing

## 2023-11-13 NOTE — Progress Notes (Signed)
[facility] PROGRESS NOTE  Amanda Stewart (DOB: [birth year]) ZOX:[id number] PCP: Fleet Contras, MD  Brief Narrative: Amanda Stewart is an 82 y.o. 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 She was found to be hypotensive, severely anemic with downward trending hgb to 7g/dl from baseline of 12 requiring RBC transfusion. Antiplatelet and anticoagulation medications are held.   Subjective: Bleeding has subsided considerably, much less blood on gauze which she still holds to her gums around the clock. Requested full liquid diet and tolerated that well yesterday. She's having significant pain in the gums and neck improved with oxycodone.  Objective: BP (!) 137/56 (BP Location: Left Wrist)   Pulse 89   Temp 98.8 F (37.1 C) (Axillary)   Resp 13   Ht 5\' 4"  (1.626 m)   Wt 71.6 kg   SpO2 97%   BMI 27.09 kg/m   Gen: Elderly female in no distress HEENT: Dental extraction sites no longer showing active oozing. Settling ecchymosis on hard palate is stable and showing bili-hue today. Neck continues to have stable dependent ecchymoses and tenderness diffusely without localized fluctuance (limited by pain).   Pulm: Clear, nonlabored  CV: RRR, paced rhythm, no MRG or edema GI: Soft, NT, ND, +BS  Neuro: Alert and oriented. Stable HOH. No new focal deficits. Ext: Warm, no deformities. Skin: No other/new rashes, lesions or ulcers on visualized skin   Assessment & Plan: Hypotension: Due to hemorrhage/anemia. Improved with volume resuscitation/transfusion. - Hold home norvasc, losartan, metoprolol pending improvement - Remain on PCU given her frailty/age and severe anemia.    Acute blood loss anemia: Due to dental extraction bleeding.  - Despite significant risks of holding anticoagulation/DAPT, we must do so at this time for  transfusion-dependent anemia/hemodynamically significant bleeding. Will restart these as soon as bleeding has subsided, presumably clot formation enough underway to prevent rebleeding. May reinitiate a single agent today pending radiographic evaluations. - Continue compression with gauze, applied TXA with improvement. Hgb up as would be expected after transfusion on 12/16, recheck CBC in AM. Platelets remain wnl. Avoid NSAIDs.   - Called the dental offices of Dr. Mayford Knife, then Dr. Chales Salmon, both confirmed there is no coverage for inpatients at this time. Will check CT neck (pending this AM) and orthopantogram to r/o deep space hematoma/abscess (given her degree of pain)   Leukocytosis: No urinary symptoms or respiratory symptoms. WBC improved, remains afebrile but does have significant tenderness in lower face/neck and settling ecchymosis on lower neck.  - CT neck, orthopantogram, given her pacemaker and need to avoid bacteremia with ongoing open oral wounds, will cover with augmentin.   Increased anion gap metabolic acidosis: Resolved. - Secondary to AKI, dehydration    AKI on stage IIIa CKD:  - Improving, po intake is improving, avoid nephrotoxins.  - Further work up if not responding to therapy.    PAF, CHB s/p PPM:  - With hypotension, will hold meds that can lower BP. Also continue to hold DOAC with ongoing bleeding, hypotension and severe anemia.   PAD, carotid stenosis, hx CVA: s/p R TCAR for symptomatic carotid stenosis in October - She is to remain on DAPT until January but we need to hold for now.  - Source of bleeding is obviously extraction sites, though the level of her tenderness on right neck makes me feel  the need to scan, as discussed above.    T2DM: Last HbA1c 10.1%.  - Continue SSI  - Holding empagliflozin with AKI  Tyrone Nine, MD [facility] [email or web address] 11/13/2023, 9:14 AM

## 2023-11-14 DIAGNOSIS — K1379 Other lesions of oral mucosa: Secondary | ICD-10-CM

## 2023-11-14 LAB — GLUCOSE, CAPILLARY
Glucose-Capillary: 134 mg/dL — ABNORMAL HIGH (ref 70–99)
Glucose-Capillary: 157 mg/dL — ABNORMAL HIGH (ref 70–99)
Glucose-Capillary: 141 mg/dL — ABNORMAL HIGH (ref 70–99)
Glucose-Capillary: 135 mg/dL — ABNORMAL HIGH (ref 70–99)

## 2023-11-14 LAB — BASIC METABOLIC PANEL
Anion gap: 8 (ref 5–15)
BUN: 7 mg/dL — ABNORMAL LOW (ref 8–23)
CO2: 24 mmol/L (ref 22–32)
Calcium: 8.4 mg/dL — ABNORMAL LOW (ref 8.9–10.3)
Chloride: 111 mmol/L (ref 98–111)
Creatinine, Ser: 0.92 mg/dL (ref 0.44–1.00)
GFR, Estimated: >60 mL/min (ref >60)
Glucose, Bld: 126 mg/dL — ABNORMAL HIGH (ref 70–99)
Potassium: 3.9 mmol/L (ref 3.5–5.1)
Sodium: 143 mmol/L (ref 135–145)

## 2023-11-14 LAB — CBC
HCT: 25.3 % — ABNORMAL LOW (ref 36.0–46.0)
Hemoglobin: 8.3 g/dL — ABNORMAL LOW (ref 12.0–15.0)
MCH: 31.3 pg (ref 26.0–34.0)
MCHC: 32.8 g/dL (ref 30.0–36.0)
MCV: 95.5 fL (ref 80.0–100.0)
Platelets: 189 10*3/uL (ref 150–400)
RBC: 2.65 MIL/uL — ABNORMAL LOW (ref 3.87–5.11)
RDW: 18.3 % — ABNORMAL HIGH (ref 11.5–15.5)
WBC: 7 10*3/uL (ref 4.0–10.5)
nRBC: 0 % (ref 0.0–0.2)

## 2023-11-14 NOTE — Progress Notes (Addendum)
PROGRESS NOTE    Amanda Stewart  ZOX:[id number] DOB: 12-20-1940 DOA: 11/10/2023 PCP: Fleet Contras, MD    Brief Narrative:   Amanda Stewart is a 82 y.o. 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  Patient reports dental extractions on 11/09/2023.  Also endorses generalized weakness.  On EMS arrival, initial blood pressure reported in the mid 80s/90s systolic.  Patient was transported to the ED for further evaluation management.  In the ED, temperature 97.5 F, HR 86, RR 15, BP 113/46, SpO2 100% on room air.  WBC 13.1, hemoglobin 8.3, platelet count 260.  Sodium 137, potassium 3.7, chloride 102, CO2 19, glucose 189, BUN 41, creatinine 2.77.  AST 17, ALT 14, total bilirubin 0.6.  High sensitive troponin 19 followed by 17.  Lactic acid 2.7.  INR 1.8.  Chest x-ray with no active cardiopulmonary disease process.  Patient was given IV fluids, oral TXA.  TRH consulted for admission for further evaluation and management of oral bleeding, generalized weakness.  Assessment & Plan:   Acute blood loss anemia secondary to oral bleeding from recent dental extraction Patient presenting with oral bleeding following dental extraction that was performed on 11/09/2023.  Complicated by use of DAPT.  Received oral TXA with subsiding of bleeding. -- Hgb 8.3>>7.0>6.4>8.9>7.7>8.3 -- Received 1 unit PRBC 12/15 and 12/16 -- holding home Eliquis, aspirin, Plavix -- repeat CBC in am  Hypotension, resolved Lactic acidosis: Resolved Etiology likely secondary to acute blood loss anemia/hemorrhage, improved with volume resuscitation and blood transfusion. -- BP 106/54, continue to hold home amlodipine, losartan, metoprolol -- Monitor BP closely  Cellulitis, anterior neck Patient  complaining of tenderness to lower face/neck.  Orthopantogram with no acute findings.  CT neck notable for mild soft tissue edema superficial to left mandible bed without drainable fluid collection consistent with cellulitis. -- WBC 13.1>>12.0>8.6>7.0 -- Augmentin 500-125 mg p.o. twice daily (day#4)  Anion gap metabolic acidosis: Resolved Etiology likely secondary to acute renal failure, dehydration.  Improved with IV fluid resuscitation.  Acute renal failure on CKD stage IIIa: Resolved Etiology likely secondary to prerenal azotemia secondary to poor oral intake in the days preceding hospitalization versus ATN with mild transient hypotension.  Creatinine elevated 2.77 on admission.  Supported with IV fluid hydration. -- Cr 2.77>3.00>>1.48>0.92 -- BMP in a.m.  Paroxysmal atrial fibrillation CHB s/p PPM -- Continue to hold home metoprolol, DOAC (apixaban) given hypotension/oral bleeding as above -- Continue monitor on telemetry, paced rhythm  PAD History of carotid stenosis s/p TCAR October 2024 -- On DAPT with Plavix and aspirin outpatient, was to remain until January -- Holding DAPT for now, anticipate restart 1 agent tomorrow if hemoglobin remains stable  Type 2 diabetes mellitus Hemoglobin A1c 10.1%, correlating with poor control.  On Jardiance outpatient. -- Continue to hold Jardiance -- SSI for coverage -- CBG before every meal/at bedtime   DVT prophylaxis: SCDs Start: 11/11/23 0013    Code Status: Full Code Family Communication: No family present at bedside this morning  Disposition Plan:  Level of care: Progressive Status is: Inpatient Remains inpatient appropriate because: Needs continued monitoring of renal function, hemoglobin, anticipate restart of 1 antiplatelet tomorrow.    Consultants:  None, no dental coverage available inpatient  Procedures:  None  Antimicrobials:  Augmentin 12/15>>  Subjective: Patient seen examined bedside, resting calmly.  Lying  in bed.  Continues with gauze to dental extraction sites, clean without any blood.  Patient reports has not been out of bed; feels weak.  No other specific complaints or concerns at this time.  Denies headache, no visual changes, no chest pain, no palpitations, no shortness of breath, no abdominal pain, no fever/chills/night sweats, no nausea/vomiting/diarrhea, no focal weakness, no fatigue, no paresthesias.  No acute events overnight per nursing staff.  Objective: Vitals:   11/14/23 0738 11/14/23 1223 11/14/23 1224 11/14/23 1323  BP: 128/62 97/84 97/84    Pulse: 100 (!) 104 (!) 103 100  Resp: 20  20   Temp: 98.7 F (37.1 C) 98.3 F (36.8 C) 98.3 F (36.8 C)   TempSrc: Oral Oral    SpO2: 100% 100% 99% 100%  Weight:      Height:        Intake/Output Summary (Last 24 hours) at 11/14/2023 1407 Last data filed at 11/14/2023 0912 Gross per 24 hour  Intake 360 ml  Output 1700 ml  Net -1340 ml   Filed Weights   11/11/23 0125  Weight: 71.6 kg    Examination:  Physical Exam: GEN: NAD, alert and oriented x 3, elderly in appearance HEENT: NCAT, PERRL, EOMI, sclera clear, MMM, noted dental extraction sites with no signs of recurrent bleeding, patient continue to keep gauze in place PULM: CTAB w/o wheezes/crackles, normal respiratory effort CV: RRR w/o M/G/R, PPM noted GI: abd soft, NTND, NABS, no R/G/M MSK: no peripheral edema, muscle strength globally intact 5/5 bilateral upper/lower extremities NEURO: CN II-XII intact, no focal deficits, sensation to light touch intact PSYCH: normal mood/affect Integumentary: Ecchymosis noted to anterior lower neck with mild TTP, no fluctuance, otherwise no other concerning rashes/lesions/wounds noted on exposed skin surfaces.    Data Reviewed: I have personally reviewed following labs and imaging studies  CBC: Recent Labs  Lab 11/10/23 2130 11/10/23 2145 11/11/23 1258 11/11/23 2103 11/12/23 0528 11/12/23 1410 11/13/23 0323 11/14/23 0333   WBC 13.1*   < > 13.0* 12.0* 9.7  --  8.6 7.0  NEUTROABS 9.3*  --   --   --   --   --   --   --   HGB 8.3*   < > 8.2* 7.1* 6.4* 8.9* 7.7* 8.3*  HCT 26.0*   < > 26.2* 21.5* 20.4* 27.5* 23.3* 25.3*  MCV 102.0*   < > 94.6 90.7 94.4  --  92.8 95.5  PLT 260   < > 217 195 193  --  181 189   < > = values in this interval not displayed.   Basic Metabolic Panel: Recent Labs  Lab 11/10/23 2130 11/10/23 2145 11/11/23 0346 11/12/23 0528 11/14/23 0333  NA 137 136 140 141 143  K 3.7 4.3 3.7 3.6 3.9  CL 102 105 109 115* 111  CO2 19*  --  18* 22 24  GLUCOSE 189* 181* 155* 167* 126*  BUN 41* 47* 39* 22 7*  CREATININE 2.77* 3.00* 2.36* 1.48* 0.92  CALCIUM 8.8*  --  8.1* 8.2* 8.4*   GFR: Estimated Creatinine Clearance: 44.2 mL/min (by C-G formula based on SCr of 0.92 mg/dL). Liver Function Tests: Recent Labs  Lab 11/10/23 2130  AST 17  ALT 14  ALKPHOS 83  BILITOT 0.6  PROT 6.4*  ALBUMIN 3.0*   No results for input(s): "LIPASE", "AMYLASE" in the last 168 hours. No results for input(s): "AMMONIA" in the last 168 hours. Coagulation  Profile: Recent Labs  Lab 11/10/23 2130 11/12/23 1410  INR 1.8* 1.3*   Cardiac Enzymes: No results for input(s): "CKTOTAL", "CKMB", "CKMBINDEX", "TROPONINI" in the last 168 hours. BNP (last 3 results) No results for input(s): "PROBNP" in the last 8760 hours. HbA1C: No results for input(s): "HGBA1C" in the last 72 hours. CBG: Recent Labs  Lab 11/13/23 1306 11/13/23 1644 11/13/23 2120 11/14/23 0607 11/14/23 1223  GLUCAP 122* 198* 140* 134* 157*   Lipid Profile: No results for input(s): "CHOL", "HDL", "LDLCALC", "TRIG", "CHOLHDL", "LDLDIRECT" in the last 72 hours. Thyroid Function Tests: No results for input(s): "TSH", "T4TOTAL", "FREET4", "T3FREE", "THYROIDAB" in the last 72 hours. Anemia Panel: No results for input(s): "VITAMINB12", "FOLATE", "FERRITIN", "TIBC", "IRON", "RETICCTPCT" in the last 72 hours. Sepsis Labs: Recent Labs  Lab  11/10/23 2146 11/11/23 0346 11/11/23 1258  LATICACIDVEN 2.7* 1.1 3.0*    No results found for this or any previous visit (from the past 240 hours).       Radiology Studies: CT SOFT TISSUE NECK WO CONTRAST Result Date: 11/14/2023 CLINICAL DATA:  Soft tissue infection EXAM: CT NECK WITHOUT CONTRAST TECHNIQUE: Multidetector CT imaging of the neck was performed following the standard protocol without intravenous contrast. RADIATION DOSE REDUCTION: This exam was performed according to the departmental dose-optimization program which includes automated exposure control, adjustment of the mA and/or kV according to patient size and/or use of iterative reconstruction technique. COMPARISON:  None Available. FINDINGS: Pharynx and larynx: Normal. No mass or swelling. Salivary glands: No inflammation, mass, or stone. Thyroid: Normal. Lymph nodes: None enlarged or abnormal density. Vascular: Calcific aortic atherosclerosis. Status post right carotid endarterectomy. Limited intracranial: Negative. Visualized orbits: Negative. Mastoids and visualized paranasal sinuses: Clear. Skeleton: No acute or aggressive process. Upper chest: Negative. Other: There is an area of mild soft tissue edema superficial to the left mandibular body. No drainable fluid collection. Mild induration of the subcutaneous facial fat. IMPRESSION: 1. Mild soft tissue edema superficial to the left mandibular body, compatible with cellulitis. No drainable fluid collection. Aortic Atherosclerosis (ICD10-I70.0). Electronically Signed   By: Deatra Robinson M.D.   On: 11/14/2023 00:53   DG Orthopantogram Result Date: 11/13/2023 CLINICAL DATA:  Bleeding after dental extractions. EXAM: ORTHOPANTOGRAM/PANORAMIC COMPARISON:  None Available. FINDINGS: No obvious bony or dental abnormalities identified. There are very few remaining native teeth. IMPRESSION: No obvious bony or dental abnormalities identified. Electronically Signed   By: Irish Lack  M.D.   On: 11/13/2023 13:43        Scheduled Meds:  amoxicillin-clavulanate  1 tablet Oral BID   insulin aspart  0-9 Units Subcutaneous TID WC   Continuous Infusions:   LOS: 3 days    Time spent: 53 minutes spent on chart review, discussion with nursing staff, consultants, updating family and interview/physical exam; more than 50% of that time was spent in counseling and/or coordination of care.    Alvira Philips [place], DO [facility] Available via Epic secure chat 7am-7pm After these hours, please refer to coverage provider listed on [email or web address] 11/14/2023, 2:07 PM

## 2023-11-14 NOTE — Plan of Care (Signed)
  Problem: Education: Goal: Knowledge of General Education information will improve Description: Including pain rating scale, medication(s)/side effects and non-pharmacologic comfort measures Outcome: Progressing   Problem: Health Behavior/Discharge Planning: Goal: Ability to manage health-related needs will improve Outcome: Progressing   Problem: Clinical Measurements: Goal: Ability to maintain clinical measurements within normal limits will improve Outcome: Progressing Goal: Will remain free from infection Outcome: Progressing   Problem: Activity: Goal: Risk for activity intolerance will decrease Outcome: Progressing   Problem: Nutrition: Goal: Adequate nutrition will be maintained Outcome: Progressing   Problem: Coping: Goal: Level of anxiety will decrease Outcome: Progressing   Problem: Pain Management: Goal: General experience of comfort will improve Outcome: Progressing

## 2023-11-14 NOTE — Progress Notes (Signed)
Physical Therapy Treatment Patient Details Name: Amanda Stewart MRN: [id number] DOB: June 19, 1941 Today's Date: 11/14/2023   History of Present Illness 82 y.o. female admitted 11/10/23 with continued bleeding after dental extractions; workup for anemia, hypotension. Pt with swelling/tenderness lower face/neck; plan for CT neck and orthopantogram to r/o deep space hematoma/abscess. 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    PT Comments  Pt is making good, gradual progress with functional mobility, only needing CGA to ambulate in the room with a RW. She is limited by her dizziness though, described both as feeling lightheaded and like she is spinning. She vestibular assessment and orthostatics below. Will continue to follow acutely. Updated d/c recs to HHPT secondary to pt's progress and per chart review reporting family prefers pt to return home with family support.    Vestibular Assessment - 11/14/23 0001       Vestibular Assessment   General Observation Pt reported a "spinning" dizziness with vestibular testing and was noted to have a mild nystagmus. It appeared to have a L rotary component to it. It may have been downbeating to suggest L anterior canal BPPV. A L Epley maneuver was performed today before the downbeating direction was more evident. She likely would benefit from a R Epley maneuver treatment in the future.      Symptom Behavior   Subjective history of current problem Pt reports dizziness as being both lightheaded and spinning when she gets up. She says it has been going on for "a while".    Type of Dizziness  Spinning;Lightheadedness    Frequency of Dizziness when she gets up/stands up    Duration of Dizziness inconsistent    Symptom Nature Motion provoked;Positional    Aggravating Factors Sit to stand;Activity in general    Relieving Factors Lying supine;Rest    History of similar episodes unsure      Oculomotor  Exam   Oculomotor Alignment Normal    Ocular ROM WFL    Spontaneous Absent    Gaze-induced  Left beating nystagmus with L gaze    Smooth Pursuits Saccades   difficult for pt to withstand     Oculomotor Exam-Fixation Suppressed    Ocular Alignment Normal    Ocular ROM WFL    Left Head Impulse unable to tell as pt unable to tolerate it    Right Head Impulse unable to tell as pt unable to tolerate it      Positional Testing   Dix-Hallpike Dix-Hallpike Right;Dix-Hallpike Left    Horizontal Canal Testing Horizontal Canal Left;Horizontal Canal Right      Dix-Hallpike Right   Dix-Hallpike Right Duration <60 seconds    Dix-Hallpike Right Symptoms Left nystagmus   rotary     Dix-Hallpike Left   Dix-Hallpike Left Duration <60 seconds    Dix-Hallpike Left Symptoms Downbeat, left rotatory nystagmus      Horizontal Canal Right   Horizontal Canal Right Duration negative      Horizontal Canal Left   Horizontal Canal Left Duration Negative      Orthostatics   BP supine (x 5 minutes) 134/59    BP sitting 119/76    BP standing (after 1 minute) 115/63   sitting after transfer               If plan is discharge home, recommend the following: A little help with walking and/or transfers;A little help with bathing/dressing/bathroom;Assistance with cooking/housework;Direct supervision/assist for medications management;Direct  supervision/assist for financial management;Assist for transportation;Help with stairs or ramp for entrance   Can travel by private vehicle     Yes  Equipment Recommendations  BSC/3in1;Rollator (4 wheels) (pending progress)    Recommendations for Other Services       Precautions / Restrictions Precautions Precautions: Fall;Other (comment) Precaution Comments: dizziness with standing (pt reports baseline orthostasis), BPPV? Restrictions Weight Bearing Restrictions Per Provider Order: No     Mobility  Bed Mobility Overal bed mobility: Needs Assistance Bed  Mobility: Rolling, Sidelying to Sit Rolling: Min assist, Used rails Sidelying to sit: Contact guard assist, HOB elevated, Used rails       General bed mobility comments: MinA needed to assist pt in reaching rail to pull to roll. CGA and extra time to transition sidelying to sit R EOB    Transfers Overall transfer level: Needs assistance Equipment used: Rolling walker (2 wheels) Transfers: Sit to/from Stand, Bed to chair/wheelchair/BSC Sit to Stand: Contact guard assist   Step pivot transfers: Contact guard assist       General transfer comment: Pt needed cues to scoot to edge and push up from sitting surface, good carryover noted with subsequent reps. CGA for safety standing from EOB 1x, recliner 2x, and commode 2x. CGA for safety for step pivot to/from [facility] a couple times during session    Ambulation/Gait Ambulation/Gait assistance: Contact guard assist Gait Distance (Feet): 45 Feet (x4 bouts of ~2 ft > ~45 ft > ~2 ft > ~2 ft) Assistive device: Rolling walker (2 wheels) Gait Pattern/deviations: Step-through pattern, Decreased step length - right, Decreased step length - left, Decreased stride length Gait velocity: reduced Gait velocity interpretation: <1.8 ft/sec, indicate of risk for recurrent falls   General Gait Details: Pt takes slow, small steps. Distance limited by dizziness. No LOB, CGA for safety   Stairs             Wheelchair Mobility     Tilt Bed    Modified Rankin (Stroke Patients Only)       Balance Overall balance assessment: Needs assistance Sitting-balance support: Feet supported, No upper extremity supported Sitting balance-Leahy Scale: Fair Sitting balance - Comments: Pt returned herself to lying down in bed due to dizziness 1x, but came right back up when cued   Standing balance support: Bilateral upper extremity supported, During functional activity, Reliant on assistive device for balance Standing balance-Leahy Scale: Poor Standing  balance comment: Reliant on RW                            Cognition Arousal: Alert Behavior During Therapy: Flat affect Overall Cognitive Status: No family/caregiver present to determine baseline cognitive functioning                                 General Comments: pleasant though flat affect. follows directions and expresses needs. difficult to fully determine due to minimal verbalizations. pt reports not talking much due pain when asked. Seems slow to process, but may be due to [facility]        Exercises Other Exercises Other Exercises: x1 L Epley maneuver    General Comments General comments (skin integrity, edema, etc.): BP - 134/59 supine, 119/76 sitting, 115/63 sitting after transfers to commode, SBP 120s end of session after ambulating      Pertinent Vitals/Pain Pain Assessment Pain Assessment: Faces Faces Pain Scale: Hurts little more Pain  Location: mouth, head Pain Descriptors / Indicators: Sore, Discomfort, Grimacing, Guarding Pain Intervention(s): Monitored during session, Limited activity within patient's tolerance, Repositioned    Home Living                          Prior Function            PT Goals (current goals can now be found in the care plan section) Acute Rehab PT Goals Patient Stated Goal: to improve PT Goal Formulation: With patient Time For Goal Achievement: 11/26/23 Potential to Achieve Goals: Good Progress towards PT goals: Progressing toward goals    Frequency    Min 1X/week      PT Plan      Co-evaluation              AM-PAC PT "6 Clicks" Mobility   Outcome Measure  Help needed turning from your back to your side while in a flat bed without using bedrails?: A Little Help needed moving from lying on your back to sitting on the side of a flat bed without using bedrails?: A Little Help needed moving to and from a bed to a chair (including a wheelchair)?: A Little Help needed standing up from a  chair using your arms (e.g., wheelchair or bedside chair)?: A Little Help needed to walk in hospital room?: A Little Help needed climbing 3-5 steps with a railing? : A Lot 6 Click Score: 17    End of Session Equipment Utilized During Treatment: Gait belt Activity Tolerance: Patient tolerated treatment well Patient left: in chair;with call bell/phone within reach;with chair alarm set   PT Visit Diagnosis: Other abnormalities of gait and mobility (R26.89);Muscle weakness (generalized) (M62.81);Unsteadiness on feet (R26.81);Difficulty in walking, not elsewhere classified (R26.2);Dizziness and giddiness (R42);BPPV BPPV - Right/Left : Left     Time: 3295-1884 PT Time Calculation (min) (ACUTE ONLY): 33 min  Charges:    $Gait Training: 8-22 mins $Therapeutic Activity: 8-22 mins PT General Charges $$ ACUTE PT VISIT: 1 Visit                     Virgil Benedict, PT, DPT Acute Rehabilitation Services  Office: [phone]    Bettina Gavia 11/14/2023, 12:40 PM

## 2023-11-15 DIAGNOSIS — R571 Hypovolemic shock: Secondary | ICD-10-CM | POA: Diagnosis not present

## 2023-11-15 DIAGNOSIS — D62 Acute posthemorrhagic anemia: Secondary | ICD-10-CM | POA: Diagnosis not present

## 2023-11-15 LAB — BASIC METABOLIC PANEL
Anion gap: 7 (ref 5–15)
BUN: 8 mg/dL (ref 8–23)
CO2: 26 mmol/L (ref 22–32)
Calcium: 8.5 mg/dL — ABNORMAL LOW (ref 8.9–10.3)
Chloride: 107 mmol/L (ref 98–111)
Creatinine, Ser: 1.05 mg/dL — ABNORMAL HIGH (ref 0.44–1.00)
GFR, Estimated: 52 mL/min — ABNORMAL LOW (ref >60)
Glucose, Bld: 140 mg/dL — ABNORMAL HIGH (ref 70–99)
Potassium: 3.7 mmol/L (ref 3.5–5.1)
Sodium: 140 mmol/L (ref 135–145)

## 2023-11-15 LAB — CBC
HCT: 23.3 % — ABNORMAL LOW (ref 36.0–46.0)
Hemoglobin: 7.5 g/dL — ABNORMAL LOW (ref 12.0–15.0)
MCH: 30.4 pg (ref 26.0–34.0)
MCHC: 32.2 g/dL (ref 30.0–36.0)
MCV: 94.3 fL (ref 80.0–100.0)
Platelets: 225 10*3/uL (ref 150–400)
RBC: 2.47 MIL/uL — ABNORMAL LOW (ref 3.87–5.11)
RDW: 17.4 % — ABNORMAL HIGH (ref 11.5–15.5)
WBC: 6.8 10*3/uL (ref 4.0–10.5)
nRBC: 0 % (ref 0.0–0.2)

## 2023-11-15 LAB — GLUCOSE, CAPILLARY
Glucose-Capillary: 121 mg/dL — ABNORMAL HIGH (ref 70–99)
Glucose-Capillary: 159 mg/dL — ABNORMAL HIGH (ref 70–99)
Glucose-Capillary: 190 mg/dL — ABNORMAL HIGH (ref 70–99)
Glucose-Capillary: 183 mg/dL — ABNORMAL HIGH (ref 70–99)

## 2023-11-15 MED ORDER — CLOPIDOGREL BISULFATE 75 MG PO TABS
75.0000 mg | ORAL_TABLET | Freq: Every day | ORAL | Status: DC
  Administered 2023-11-15 – 2023-11-16 (×2): 75 mg via ORAL
  Filled 2023-11-15 (×2): qty 1

## 2023-11-15 MED ORDER — OXYCODONE HCL 5 MG PO TABS: 5.0000 mg | ORAL_TABLET | Freq: Four times a day (QID) | ORAL | Status: DC | PRN

## 2023-11-15 NOTE — Progress Notes (Signed)
PROGRESS NOTE    Amanda Stewart  ZOX:[id number] DOB: Dec 13, 1940 DOA: 11/10/2023 PCP: Fleet Contras, MD    Brief Narrative:   Amanda Stewart is a 82 y.o. 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  Patient reports dental extractions on 11/09/2023.  Also endorses generalized weakness.  On EMS arrival, initial blood pressure reported in the mid 80s/90s systolic.  Patient was transported to the ED for further evaluation management.  In the ED, temperature 97.5 F, HR 86, RR 15, BP 113/46, SpO2 100% on room air.  WBC 13.1, hemoglobin 8.3, platelet count 260.  Sodium 137, potassium 3.7, chloride 102, CO2 19, glucose 189, BUN 41, creatinine 2.77.  AST 17, ALT 14, total bilirubin 0.6.  High sensitive troponin 19 followed by 17.  Lactic acid 2.7.  INR 1.8.  Chest x-ray with no active cardiopulmonary disease process.  Patient was given IV fluids, oral TXA.  TRH consulted for admission for further evaluation and management of oral bleeding, generalized weakness.  Assessment & Plan:   Acute blood loss anemia secondary to oral bleeding from recent dental extraction Patient presenting with oral bleeding following dental extraction that was performed on 11/09/2023.  Complicated by use of DAPT.  Received oral TXA with subsiding of bleeding. -- Hgb 8.3>>7.0>6.4>8.9>7.7>8.3>7.5 -- Received 1 unit PRBC 12/15 and 12/16 -- restart Plavix today -- holding home Eliquis, aspirin -- repeat H&H in the morning  Hypotension, resolved Lactic acidosis: Resolved Etiology likely secondary to acute blood loss anemia/hemorrhage, improved with volume resuscitation and blood transfusion. -- BP 106/54, continue to hold home amlodipine, losartan, metoprolol -- Monitor BP closely  Cellulitis,  anterior neck Patient complaining of tenderness to lower face/neck.  Orthopantogram with no acute findings.  CT neck notable for mild soft tissue edema superficial to left mandible bed without drainable fluid collection consistent with cellulitis. -- WBC 13.1>>12.0>8.6>7.0>6.8 -- Augmentin 500-125 mg p.o. twice daily (day#5)  Anion gap metabolic acidosis: Resolved Etiology likely secondary to acute renal failure, dehydration.  Improved with IV fluid resuscitation.  Acute renal failure on CKD stage IIIa: Resolved Etiology likely secondary to prerenal azotemia secondary to poor oral intake in the days preceding hospitalization versus ATN with mild transient hypotension.  Creatinine elevated 2.77 on admission.  Supported with IV fluid hydration. -- Cr 2.77>3.00>>1.48>0.92>1.05 -- BMP in a.m.  Paroxysmal atrial fibrillation CHB s/p PPM -- Continue to hold home metoprolol, DOAC (apixaban) given hypotension/oral bleeding as above -- Continue monitor on telemetry, paced rhythm  PAD History of carotid stenosis s/p TCAR October 2024 -- On DAPT with Plavix and aspirin outpatient, was to remain until January -- Restarting Plavix today as above  Type 2 diabetes mellitus Hemoglobin A1c 10.1%, correlating with poor control.  On Jardiance outpatient. -- Continue to hold Jardiance -- SSI for coverage -- CBG before every meal/at bedtime   DVT prophylaxis: SCDs Start: 11/11/23 0013    Code Status: Full Code Family Communication: No family present at bedside this morning  Disposition Plan:  Level of care: Telemetry Medical Status is: Inpatient Remains inpatient appropriate because: Needs continued monitoring hemoglobin, restarting Plavix today, anticipate discharge home tomorrow if hemoglobin remains stable    Consultants:  None, no dental coverage available inpatient  Procedures:  None  Antimicrobials:  Augmentin 12/15>>   Subjective:  Patient seen examined bedside, resting calmly.   Lying in bed.  Asking for assistance with removal of gauze from her mouth, no further bleeding.  Hemoglobin 7.5 this morning.  Restarting Plavix today.  Patient hopeful for discharge home tomorrow. No other specific complaints or concerns at this time.  Denies headache, no visual changes, no chest pain, no palpitations, no shortness of breath, no abdominal pain, no fever/chills/night sweats, no nausea/vomiting/diarrhea, no focal weakness, no fatigue, no paresthesias.  No acute events overnight per nursing staff.  Objective: Vitals:   11/14/23 1944 11/14/23 2355 11/15/23 0329 11/15/23 0728  BP: 133/65 129/73 123/79 127/80  Pulse: 98 98 87 96  Resp: 15 19 15 20   Temp: 97.6 F (36.4 C) 99 F (37.2 C) 98.7 F (37.1 C) 97.6 F (36.4 C)  TempSrc: Oral Oral Oral Axillary  SpO2: 97% 93% 98% 100%  Weight:      Height:        Intake/Output Summary (Last 24 hours) at 11/15/2023 1232 Last data filed at 11/15/2023 1140 Gross per 24 hour  Intake 580 ml  Output 1100 ml  Net -520 ml   Filed Weights   11/11/23 0125  Weight: 71.6 kg    Examination:  Physical Exam: GEN: NAD, alert and oriented x 3, elderly in appearance HEENT: NCAT, PERRL, EOMI, sclera clear, MMM, noted dental extraction sites with no signs of recurrent bleeding PULM: CTAB w/o wheezes/crackles, normal respiratory effort CV: RRR w/o M/G/R, PPM noted GI: abd soft, NTND, NABS, no R/G/M MSK: no peripheral edema, muscle strength globally intact 5/5 bilateral upper/lower extremities NEURO: CN II-XII intact, no focal deficits, sensation to light touch intact PSYCH: normal mood/affect Integumentary: Ecchymosis noted to anterior lower neck with mild TTP, no fluctuance, otherwise no other concerning rashes/lesions/wounds noted on exposed skin surfaces.    Data Reviewed: I have personally reviewed following labs and imaging studies  CBC: Recent Labs  Lab 11/10/23 2130 11/10/23 2145 11/11/23 2103 11/12/23 0528 11/12/23 1410  11/13/23 0323 11/14/23 0333 11/15/23 0330  WBC 13.1*   < > 12.0* 9.7  --  8.6 7.0 6.8  NEUTROABS 9.3*  --   --   --   --   --   --   --   HGB 8.3*   < > 7.1* 6.4* 8.9* 7.7* 8.3* 7.5*  HCT 26.0*   < > 21.5* 20.4* 27.5* 23.3* 25.3* 23.3*  MCV 102.0*   < > 90.7 94.4  --  92.8 95.5 94.3  PLT 260   < > 195 193  --  181 189 225   < > = values in this interval not displayed.   Basic Metabolic Panel: Recent Labs  Lab 11/10/23 2130 11/10/23 2145 11/11/23 0346 11/12/23 0528 11/14/23 0333 11/15/23 0330  NA 137 136 140 141 143 140  K 3.7 4.3 3.7 3.6 3.9 3.7  CL 102 105 109 115* 111 107  CO2 19*  --  18* 22 24 26   GLUCOSE 189* 181* 155* 167* 126* 140*  BUN 41* 47* 39* 22 7* 8  CREATININE 2.77* 3.00* 2.36* 1.48* 0.92 1.05*  CALCIUM 8.8*  --  8.1* 8.2* 8.4* 8.5*   GFR: Estimated Creatinine Clearance: 38.7 mL/min (A) (by C-G formula based on SCr of 1.05 mg/dL (H)). Liver Function Tests: Recent Labs  Lab 11/10/23 2130  AST 17  ALT 14  ALKPHOS 83  BILITOT 0.6  PROT 6.4*  ALBUMIN 3.0*   No results for input(s): "LIPASE", "AMYLASE" in the last 168 hours.  No results for input(s): "AMMONIA" in the last 168 hours. Coagulation Profile: Recent Labs  Lab 11/10/23 2130 11/12/23 1410  INR 1.8* 1.3*   Cardiac Enzymes: No results for input(s): "CKTOTAL", "CKMB", "CKMBINDEX", "TROPONINI" in the last 168 hours. BNP (last 3 results) No results for input(s): "PROBNP" in the last 8760 hours. HbA1C: No results for input(s): "HGBA1C" in the last 72 hours. CBG: Recent Labs  Lab 11/14/23 1223 11/14/23 1611 11/14/23 2124 11/15/23 0613 11/15/23 1054  GLUCAP 157* 141* 135* 121* 159*   Lipid Profile: No results for input(s): "CHOL", "HDL", "LDLCALC", "TRIG", "CHOLHDL", "LDLDIRECT" in the last 72 hours. Thyroid Function Tests: No results for input(s): "TSH", "T4TOTAL", "FREET4", "T3FREE", "THYROIDAB" in the last 72 hours. Anemia Panel: No results for input(s): "VITAMINB12", "FOLATE",  "FERRITIN", "TIBC", "IRON", "RETICCTPCT" in the last 72 hours. Sepsis Labs: Recent Labs  Lab 11/10/23 2146 11/11/23 0346 11/11/23 1258  LATICACIDVEN 2.7* 1.1 3.0*    No results found for this or any previous visit (from the past 240 hours).       Radiology Studies: No results found.       Scheduled Meds:  amoxicillin-clavulanate  1 tablet Oral BID   clopidogrel  75 mg Oral Daily   insulin aspart  0-9 Units Subcutaneous TID WC   Continuous Infusions:   LOS: 4 days    Time spent: 53 minutes spent on chart review, discussion with nursing staff, consultants, updating family and interview/physical exam; more than 50% of that time was spent in counseling and/or coordination of care.    Alvira Philips [place], DO [facility] Available via Epic secure chat 7am-7pm After these hours, please refer to coverage provider listed on [email or web address] 11/15/2023, 12:32 PM

## 2023-11-15 NOTE — Progress Notes (Signed)
Patient called stating that she had wet the bed. Changed bed, cleaned patient up and changed out her oral suction [place]. While cleaning her, noticed that IV had been pulled out.  Will reinsert a new IV.

## 2023-11-15 NOTE — Plan of Care (Signed)
  Problem: Education: Goal: Knowledge of discharge needs will improve Outcome: Progressing   Problem: Clinical Measurements: Goal: Postoperative complications will be avoided or minimized Outcome: Progressing   Problem: Respiratory: Goal: Ability to achieve and maintain a regular respiratory rate will improve Outcome: Progressing   Problem: Skin Integrity: Goal: Demonstration of wound healing without infection will improve Outcome: Progressing   Problem: Education: Goal: Ability to describe self-care measures that may prevent or decrease complications (Diabetes Survival Skills Education) will improve Outcome: Progressing Goal: Individualized Educational Video(s) Outcome: Progressing   Problem: Coping: Goal: Ability to adjust to condition or change in health will improve Outcome: Progressing   Problem: Fluid Volume: Goal: Ability to maintain a balanced intake and output will improve Outcome: Progressing   Problem: Health Behavior/Discharge Planning: Goal: Ability to identify and utilize available resources and services will improve Outcome: Progressing Goal: Ability to manage health-related needs will improve Outcome: Progressing   Problem: Metabolic: Goal: Ability to maintain appropriate glucose levels will improve Outcome: Progressing   Problem: Nutritional: Goal: Maintenance of adequate nutrition will improve Outcome: Progressing Goal: Progress toward achieving an optimal weight will improve Outcome: Progressing   Problem: Skin Integrity: Goal: Risk for impaired skin integrity will decrease Outcome: Progressing   Problem: Tissue Perfusion: Goal: Adequacy of tissue perfusion will improve Outcome: Progressing   Problem: Education: Goal: Knowledge of General Education information will improve Description: Including pain rating scale, medication(s)/side effects and non-pharmacologic comfort measures Outcome: Progressing   Problem: Health Behavior/Discharge  Planning: Goal: Ability to manage health-related needs will improve Outcome: Progressing   Problem: Clinical Measurements: Goal: Ability to maintain clinical measurements within normal limits will improve Outcome: Progressing Goal: Will remain free from infection Outcome: Progressing Goal: Diagnostic test results will improve Outcome: Progressing Goal: Respiratory complications will improve Outcome: Progressing Goal: Cardiovascular complication will be avoided Outcome: Progressing   Problem: Activity: Goal: Risk for activity intolerance will decrease Outcome: Progressing   Problem: Nutrition: Goal: Adequate nutrition will be maintained Outcome: Progressing   Problem: Coping: Goal: Level of anxiety will decrease Outcome: Progressing   Problem: Elimination: Goal: Will not experience complications related to bowel motility Outcome: Progressing Goal: Will not experience complications related to urinary retention Outcome: Progressing   Problem: Pain Management: Goal: General experience of comfort will improve Outcome: Progressing   Problem: Safety: Goal: Ability to remain free from injury will improve Outcome: Progressing   Problem: Skin Integrity: Goal: Risk for impaired skin integrity will decrease Outcome: Progressing

## 2023-11-15 NOTE — Progress Notes (Signed)
Patient is alert and oriented x2. Confused to time and situation. Denied pain. Rounded on patient and noticed that she had something stuffed in her mouth as she slept. Went to assess and patient had stuffed gauze in her mouth. Educated patient that she is at risk for choking and/or aspiration when she has something in her mouth while asleep.She explained that she had it in her mouth due to bleeding but there was no blood present. Patient seems to be slightly confused. Reoriented patient to situation. Patient denied additional needs.

## 2023-11-15 NOTE — Progress Notes (Signed)
Occupational Therapy Treatment Patient Details Name: Amanda Stewart MRN: [id number] DOB: 31-May-1941 Today's Date: 11/15/2023   History of present illness 82 y.o. female admitted 11/10/23 with continued bleeding after dental extractions; workup for anemia, hypotension. Pt with swelling/tenderness lower face/neck; plan for CT neck and orthopantogram to r/o deep space hematoma/abscess. 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   OT comments  Pt making steady progress towards OT goals. Focused session on [facility] transfers, in-room mobility using RW with CGA and full bathing/dressing task seated/standing at sink with Setup-Min A. Pt with improved ability to stand without assist though continues to report intermittent dizziness during activities. Pt with initial symptomatic drop in BP from 140/62 to 96/77 sitting EOB but able to continue tasks with BP 122/68 during ADLs. Noted family preference for pt to return home at DC. Recommend HHOT and close supervision for all initial standing tasks.      If plan is discharge home, recommend the following:  A little help with walking and/or transfers;A little help with bathing/dressing/bathroom;Assistance with cooking/housework   Equipment Recommendations  None recommended by OT    Recommendations for Other Services      Precautions / Restrictions Precautions Precautions: Fall;Other (comment) Precaution Comments: dizziness with standing (pt reports baseline orthostasis), monitor BP, BPPV? Restrictions Weight Bearing Restrictions Per Provider Order: No       Mobility Bed Mobility Overal bed mobility: Needs Assistance, Modified Independent Bed Mobility: Supine to Sit, Sit to Supine                Transfers Overall transfer level: Needs assistance Equipment used: Rolling walker (2 wheels) Transfers: Sit to/from Stand, Bed to chair/wheelchair/BSC Sit to Stand: Contact guard assist      Step pivot transfers: Contact guard assist     General transfer comment: able to stand from bedside with RW on second attempt without assist. CGA for [facility] transfer and multiple stands from chair at sink during ADLs.     Balance Overall balance assessment: Needs assistance Sitting-balance support: Feet supported, No upper extremity supported Sitting balance-Leahy Scale: Good     Standing balance support: Bilateral upper extremity supported, During functional activity, Reliant on assistive device for balance, Single extremity supported Standing balance-Leahy Scale: Poor                             ADL either performed or assessed with clinical judgement   ADL Overall ADL's : Needs assistance/impaired     Grooming: Set up;Sitting;Wash/dry face;Applying deodorant Grooming Details (indicate cue type and reason): seated at sink Upper Body Bathing: Set up;Sitting   Lower Body Bathing: Minimal assistance;Sitting/lateral leans;Sit to/from stand Lower Body Bathing Details (indicate cue type and reason): Pt requested OT assist to bathe posterior region in standing. able to bathe anterior peri region in standing and then LEs seated at sink Upper Body Dressing : Set up;Sitting   Lower Body Dressing: Contact guard assist;Sitting/lateral leans;Sit to/from stand Lower Body Dressing Details (indicate cue type and reason): able to doff clean socks via crossing LE Toilet Transfer: Contact guard assist;Stand-pivot;BSC/3in1;Rolling walker (2 wheels) Toilet Transfer Details (indicate cue type and reason): urinary urgency in standing, able to stnad and pivot to [facility] with improved posture Toileting- Clothing Manipulation and Hygiene: Supervision/safety;Sitting/lateral lean;Sit to/from stand Toileting - Clothing Manipulation Details (indicate cue type and reason): performing hygiene seated on toilet     Functional mobility  during ADLs: Contact guard assist;Rolling walker (2 wheels)       Extremity/Trunk Assessment Upper Extremity Assessment Upper Extremity Assessment: Overall WFL for tasks assessed;Right hand dominant   Lower Extremity Assessment Lower Extremity Assessment: Defer to PT evaluation        Vision   Vision Assessment?: No apparent visual deficits   Perception     Praxis      Cognition Arousal: Alert Behavior During Therapy: Flat affect Overall Cognitive Status: No family/caregiver present to determine baseline cognitive functioning                                 General Comments: pleasant though flat affect. follows directions and expresses needs. difficult to fully determine due to minimal verbalizations. pt reports not talking much due to pain when asked. Seems slow to process, but may be due to [facility]        Exercises      Shoulder Instructions       General Comments BP at rest: 140/63, sitting EOB with dizziness: 96/77, during [facility] transfer: 126/109 (likely not accurate due to movement), during ADLs at sink: 122/68 and on return to bed: 138/79. Intermittent dizziness reported throughout    Pertinent Vitals/ Pain       Pain Assessment Pain Assessment: Faces Faces Pain Scale: Hurts little more Pain Location: mouth Pain Descriptors / Indicators: Sore, Discomfort, Grimacing, Guarding Pain Intervention(s): Monitored during session  Home Living                                          Prior Functioning/Environment              Frequency  Min 1X/week        Progress Toward Goals  OT Goals(current goals can now be found in the care plan section)  Progress towards OT goals: Progressing toward goals  Acute Rehab OT Goals Patient Stated Goal: for mouth to feel better, get warm OT Goal Formulation: With patient Time For Goal Achievement: 11/27/23 Potential to Achieve Goals: Good ADL Goals Pt Will Perform Lower Body Bathing: with modified independence;sit to/from stand Pt Will Perform Lower  Body Dressing: with modified independence;sit to/from stand Pt Will Transfer to Toilet: with modified independence;ambulating Additional ADL Goal #1: Pt to complete ADLs/mobility in standing for > 8 min without seated rest break  Plan      Co-evaluation                 AM-PAC OT "6 Clicks" Daily Activity     Outcome Measure   Help from another person eating meals?: None Help from another person taking care of personal grooming?: A Little Help from another person toileting, which includes using toliet, bedpan, or urinal?: A Little Help from another person bathing (including washing, rinsing, drying)?: A Little Help from another person to put on and taking off regular upper body clothing?: A Little Help from another person to put on and taking off regular lower body clothing?: A Little 6 Click Score: 19    End of Session Equipment Utilized During Treatment: Rolling walker (2 wheels);Gait belt  OT Visit Diagnosis: Unsteadiness on feet (R26.81);Other abnormalities of gait and mobility (R26.89);Muscle weakness (generalized) (M62.81)   Activity Tolerance Patient tolerated treatment well   Patient Left in bed;with call bell/phone within reach;with bed alarm  set   Nurse Communication Mobility status;Other (comment) (BP)        Time: 4098-1191 OT Time Calculation (min): 30 min  Charges: OT General Charges $OT Visit: 1 Visit OT Treatments $Self Care/Home Management : 23-37 mins  Bradd Canary, OTR/L Acute Rehab Services Office: [phone]   Lorre Munroe 11/15/2023, 10:32 AM

## 2023-11-16 ENCOUNTER — Other Ambulatory Visit (HOSPITAL_COMMUNITY): Payer: Self-pay

## 2023-11-16 DIAGNOSIS — K08409 Partial loss of teeth, unspecified cause, unspecified class: Secondary | ICD-10-CM

## 2023-11-16 DIAGNOSIS — K068 Other specified disorders of gingiva and edentulous alveolar ridge: Secondary | ICD-10-CM | POA: Diagnosis not present

## 2023-11-16 DIAGNOSIS — R571 Hypovolemic shock: Secondary | ICD-10-CM | POA: Diagnosis not present

## 2023-11-16 DIAGNOSIS — D62 Acute posthemorrhagic anemia: Secondary | ICD-10-CM | POA: Diagnosis not present

## 2023-11-16 DIAGNOSIS — K91840 Postprocedural hemorrhage and hematoma of a digestive system organ or structure following a digestive system procedure: Secondary | ICD-10-CM

## 2023-11-16 LAB — HEMOGLOBIN AND HEMATOCRIT, BLOOD
HCT: 24.3 % — ABNORMAL LOW (ref 36.0–46.0)
Hemoglobin: 8.3 g/dL — ABNORMAL LOW (ref 12.0–15.0)

## 2023-11-16 LAB — GLUCOSE, CAPILLARY: Glucose-Capillary: 149 mg/dL — ABNORMAL HIGH (ref 70–99)

## 2023-11-16 MED ORDER — AMOXICILLIN-POT CLAVULANATE 500-125 MG PO TABS
1.0000 | ORAL_TABLET | Freq: Two times a day (BID) | ORAL | 0 refills | Status: AC
  Filled 2023-11-16: qty 10, 5d supply, fill #0

## 2023-11-16 MED ORDER — ASPIRIN 81 MG PO TBEC
81.0000 mg | DELAYED_RELEASE_TABLET | Freq: Every day | ORAL | Status: DC
  Administered 2023-11-16: 81 mg via ORAL
  Filled 2023-11-16: qty 1

## 2023-11-16 NOTE — Care Management Important Message (Signed)
Important Message  Patient Details  Name: Amanda Stewart MRN: [id number] Date of Birth: [birth year]   Important Message Given:  Yes - Medicare IM     Renie Ora 11/16/2023, 9:36 AM

## 2023-11-16 NOTE — Discharge Instructions (Signed)
May resumne Eliquis in 7 days, on 11/23/2023. Ensure close follow-up with dental medicine on discharge.

## 2023-11-16 NOTE — Progress Notes (Signed)
Patient given discharge instructions by discharge RN. Telemetry removed. CCMD notified. TOC meds delivered to patient. Patient taken to vehicle in wheelchair by staff.  Kenard Gower, RN

## 2023-11-16 NOTE — Discharge Summary (Signed)
Physician Discharge Summary  [name] FAO:[id number] DOB: 02-07-1941 DOA: 11/10/2023  PCP: Fleet Contras, MD  Admit date: 11/10/2023 Discharge date: 11/16/2023  Admitted From: Home Disposition: Home  Recommendations for Outpatient Follow-up:  Follow up with PCP in 1-2 weeks Follow-up with dental medicine in 1 week Continue to hold home Eliquis for additional 7 days, may restart on 11/23/2023 Continue antibiotics with Augmentin to complete course for cellulitis to neck Recommend repeat CBC 1 week to recheck hemoglobin level  Home Health: No Equipment/Devices: None  Discharge Condition: Stable CODE STATUS: Full code Diet recommendation: Heart healthy/consistent carbohydrate diet  History of present illness:  Amanda Stewart is a 82 y.o. 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  Patient reports dental extractions on 11/09/2023.  Also endorses generalized weakness.  On EMS arrival, initial blood pressure reported in the mid 80s/90s systolic.  Patient was transported to the ED for further evaluation management.   In the ED, temperature 97.5 F, HR 86, RR 15, BP 113/46, SpO2 100% on room air.  WBC 13.1, hemoglobin 8.3, platelet count 260.  Sodium 137, potassium 3.7, chloride 102, CO2 19, glucose 189, BUN 41, creatinine 2.77.  AST 17, ALT 14, total bilirubin 0.6.  High sensitive troponin 19 followed by 17.  Lactic acid 2.7.  INR 1.8.  Chest x-ray with no active cardiopulmonary disease process.  Patient was given IV fluids, oral TXA.  TRH consulted for admission for further evaluation and management of oral bleeding, generalized weakness.  Hospital course:  Acute blood loss anemia secondary to oral bleeding from recent dental extraction Patient presenting  with oral bleeding following dental extraction that was performed on 11/09/2023.  Complicated by use of DAPT.  Received oral TXA with subsiding of bleeding.  Patient was transfused 2 unit PRBCs during hospitalization.  Her home Plavix, aspirin and Eliquis were held.  Patient's bleeding have subsided and her Plavix and aspirin have been restarted.  Will continue to hold Eliquis for another 7 days before restarting.  Outpatient follow-up with dental medicine.   Hypotension, resolved Lactic acidosis: Resolved Etiology likely secondary to acute blood loss anemia/hemorrhage, improved with volume resuscitation and blood transfusion.  Home blood pressure medicines initially held during hospitalization due to hypotension, improved with volume resuscitation and blood transfusion.  May resume on discharge.  Outpatient follow-up with PCP. -  Cellulitis, anterior neck Patient complaining of tenderness to lower face/neck.  Orthopantogram with no acute findings.  CT neck notable for mild soft tissue edema superficial to left mandible bed without drainable fluid collection consistent with cellulitis.  WBC count initially elevated 13.1.  Started on Augmentin with improvement with follow-up white blood cell count 6.8 at time of discharge.  Continue Augmentin to complete 10-day course.   Anion gap metabolic acidosis: Resolved Etiology likely secondary to acute renal failure, dehydration.  Improved with IV fluid resuscitation.   Acute renal failure on CKD stage IIIa: Resolved Etiology likely secondary to prerenal azotemia secondary to poor oral intake in the days preceding hospitalization versus ATN with mild transient hypotension.  Creatinine elevated 2.77 on admission.  Supported with IV fluid hydration.  Creatinine peaked at 3.00 during hospitalization and improved to 1.05 at time of discharge.   Paroxysmal atrial fibrillation CHB s/p PPM Continue metoprolol.  Will continue  to hold home apixaban as above for  another 7 days and then may restart if no further bleeding from tooth extraction sites.     PAD History of carotid stenosis s/p TCAR October 2024 Continue DAPT with Plavix and aspirin.   Type 2 diabetes mellitus Hemoglobin A1c 10.1%, correlating with poor control.  On Jardiance outpatient.  Outpatient follow-up with PCP for further guidance.   Discharge Diagnoses:  Principal Problem:   Hypotension Active Problems:   Acute blood loss anemia   Diabetes mellitus, type 2 (HCC)   Complete heart block (HCC)   Pacemaker   PAD (peripheral artery disease) (HCC)   Paroxysmal atrial fibrillation (HCC)   AKI (acute kidney injury) (HCC)   Increased anion gap metabolic acidosis    Discharge Instructions  Discharge Instructions     Call MD for:  difficulty breathing, headache or visual disturbances   Complete by: As directed    Call MD for:  extreme fatigue   Complete by: As directed    Call MD for:  persistant dizziness or light-headedness   Complete by: As directed    Call MD for:  persistant nausea and vomiting   Complete by: As directed    Call MD for:  severe uncontrolled pain   Complete by: As directed    Call MD for:  temperature >100.4   Complete by: As directed    Diet - low sodium heart healthy   Complete by: As directed    Increase activity slowly   Complete by: As directed       Allergies as of 11/16/2023       Reactions   Metformin And Related Other (See Comments)   Diarrhea         Medication List     PAUSE taking these medications    apixaban 5 MG Tabs tablet Wait to take this until: November 23, 2023 Commonly known as: ELIQUIS Take 1 tablet (5 mg total) by mouth 2 (two) times daily.       STOP taking these medications    penicillin v potassium 500 MG tablet Commonly known as: VEETID       TAKE these medications    acetaminophen 325 MG tablet Commonly known as: TYLENOL Take 1-2 tablets (325-650 mg total) by mouth every 4 (four) hours  as needed for mild pain. What changed: how much to take   albuterol 108 (90 Base) MCG/ACT inhaler Commonly known as: VENTOLIN HFA Inhale 2 puffs into the lungs every 6 (six) hours as needed for wheezing or shortness of breath.   amLODipine 5 MG tablet Commonly known as: NORVASC Take 5 mg by mouth daily.   amoxicillin-clavulanate 500-125 MG tablet Commonly known as: AUGMENTIN Take 1 tablet by mouth 2 (two) times daily for 5 days.   aspirin EC 81 MG tablet Take 81 mg by mouth daily.   atorvastatin 80 MG tablet Commonly known as: LIPITOR Take 1 tablet (80 mg total) by mouth daily.   chlorhexidine 0.12 % solution Commonly known as: PERIDEX Use as directed 5 mLs in the mouth or throat 4 (four) times daily.   clopidogrel 75 MG tablet Commonly known as: PLAVIX Take 1 tablet (75 mg total) by mouth daily.   dorzolamide 2 % ophthalmic solution Commonly known as: TRUSOPT Place 1 drop into both eyes 2 (two) times daily.   ibuprofen 800 MG tablet Commonly known as: ADVIL Take 800 mg by mouth every 6 (six) hours as needed.   Jardiance 25 MG Tabs  tablet Generic drug: empagliflozin Take 25 mg by mouth daily.   losartan 100 MG tablet Commonly known as: COZAAR Take 100 mg by mouth daily.   metoprolol tartrate 25 MG tablet Commonly known as: LOPRESSOR Take 1 tablet (25 mg total) by mouth 2 (two) times daily.   multivitamin with minerals Tabs tablet Take 1 tablet by mouth daily.   nitroGLYCERIN 0.4 MG SL tablet Commonly known as: NITROSTAT Place 1 tablet (0.4 mg total) under the tongue every 5 (five) minutes x 3 doses as needed for chest pain.   pantoprazole 40 MG tablet Commonly known as: PROTONIX Take 1 tablet (40 mg total) by mouth daily at 6 (six) AM.   potassium chloride 10 MEQ tablet Commonly known as: KLOR-CON Take 10 mEq by mouth daily.        Follow-up Information     Fleet Contras, MD. Schedule an appointment as soon as possible for a visit in 1 week(s).    Specialty: Internal Medicine Contact information: [address] [place] [address] [phone]                Allergies  Allergen Reactions   Metformin And Related Other (See Comments)    Diarrhea     Consultations: None, dental medicine unavailable   Procedures/Studies: CT SOFT TISSUE NECK WO CONTRAST Result Date: 11/14/2023 CLINICAL DATA:  Soft tissue infection EXAM: CT NECK WITHOUT CONTRAST TECHNIQUE: Multidetector CT imaging of the neck was performed following the standard protocol without intravenous contrast. RADIATION DOSE REDUCTION: This exam was performed according to the departmental dose-optimization program which includes automated exposure control, adjustment of the mA and/or kV according to patient size and/or use of iterative reconstruction technique. COMPARISON:  None Available. FINDINGS: Pharynx and larynx: Normal. No mass or swelling. Salivary glands: No inflammation, mass, or stone. Thyroid: Normal. Lymph nodes: None enlarged or abnormal density. Vascular: Calcific aortic atherosclerosis. Status post right carotid endarterectomy. Limited intracranial: Negative. Visualized orbits: Negative. Mastoids and visualized paranasal sinuses: Clear. Skeleton: No acute or aggressive process. Upper chest: Negative. Other: There is an area of mild soft tissue edema superficial to the left mandibular body. No drainable fluid collection. Mild induration of the subcutaneous facial fat. IMPRESSION: 1. Mild soft tissue edema superficial to the left mandibular body, compatible with cellulitis. No drainable fluid collection. Aortic Atherosclerosis (ICD10-I70.0). Electronically Signed   By: Deatra Robinson M.D.   On: 11/14/2023 00:53   DG Orthopantogram Result Date: 11/13/2023 CLINICAL DATA:  Bleeding after dental extractions. EXAM: ORTHOPANTOGRAM/PANORAMIC COMPARISON:  None Available. FINDINGS: No obvious bony or dental abnormalities identified. There are very few remaining  native teeth. IMPRESSION: No obvious bony or dental abnormalities identified. Electronically Signed   By: Irish Lack M.D.   On: 11/13/2023 13:43   DG Chest Port 1 View Result Date: 11/10/2023 CLINICAL DATA:  Weakness, postop bleeding in the mouth EXAM: PORTABLE CHEST 1 VIEW COMPARISON:  08/29/2023 FINDINGS: Low lung volumes accentuate cardiomediastinal silhouette and pulmonary vascularity. Left chest wall pacemaker. No focal consolidation, pleural effusion, or pneumothorax. No displaced rib fractures. IMPRESSION: No active disease. Electronically Signed   By: Minerva Fester M.D.   On: 11/10/2023 22:02   CUP PACEART REMOTE DEVICE CHECK Result Date: 11/07/2023 Scheduled remote reviewed. Normal device function.  Hx of PAF, controlled rates burden 5.3%, Eliquis per EPIC Next remote 91 days. LA, CVRS    Subjective: Patient seen examined bedside, resting calmly.  Lying in bed.  Continues with no bleeding from her gums over the last 72  hours.  Has been restarted on aspirin and Plavix.  Discussed will continue to hold home Eliquis for another 7 days before restarting.  Discussed with patient's daughter Sue Lush over the telephone this morning.  Discharging home.  Denies headache, no dizziness, no chest pain, no palpitations, no shortness of breath, no abdominal pain, no fever/chills/night sweats, no nausea/vomiting/diarrhea, no focal weakness, no fatigue, no paresthesias.  No acute events overnight per nursing staff.  Discharge Exam: Vitals:   11/16/23 0403 11/16/23 0725  BP: 117/64 (!) 137/54  Pulse: 88 83  Resp: 20 20  Temp: 98.7 F (37.1 C) 98.6 F (37 C)  SpO2: 100% 94%   Vitals:   11/15/23 1915 11/15/23 2356 11/16/23 0403 11/16/23 0725  BP: 137/70 132/71 117/64 (!) 137/54  Pulse: 80 85 88 83  Resp: 20 20 20 20   Temp: 97.9 F (36.6 C) 98.9 F (37.2 C) 98.7 F (37.1 C) 98.6 F (37 C)  TempSrc: Oral Axillary Oral Oral  SpO2: 100% 100% 100% 94%  Weight:      Height:         Physical Exam: GEN: NAD, alert and oriented x 3, elderly in appearance HEENT: NCAT, PERRL, EOMI, sclera clear, MMM, noted dental extraction sites with no signs of recurrent bleeding PULM: CTAB w/o wheezes/crackles, normal respiratory effort CV: RRR w/o M/G/R, PPM noted GI: abd soft, NTND, NABS, no R/G/M MSK: no peripheral edema, muscle strength globally intact 5/5 bilateral upper/lower extremities NEURO: CN II-XII intact, no focal deficits, sensation to light touch intact PSYCH: normal mood/affect Integumentary: Ecchymosis noted to anterior lower neck with mild TTP, no fluctuance, otherwise no other concerning rashes/lesions/wounds noted on exposed skin surfaces.    The results of significant diagnostics from this hospitalization (including imaging, microbiology, ancillary and laboratory) are listed below for reference.     Microbiology: No results found for this or any previous visit (from the past 240 hours).   Labs: BNP (last 3 results) No results for input(s): "BNP" in the last 8760 hours. Basic Metabolic Panel: Recent Labs  Lab 11/10/23 2130 11/10/23 2145 11/11/23 0346 11/12/23 0528 11/14/23 0333 11/15/23 0330  NA 137 136 140 141 143 140  K 3.7 4.3 3.7 3.6 3.9 3.7  CL 102 105 109 115* 111 107  CO2 19*  --  18* 22 24 26   GLUCOSE 189* 181* 155* 167* 126* 140*  BUN 41* 47* 39* 22 7* 8  CREATININE 2.77* 3.00* 2.36* 1.48* 0.92 1.05*  CALCIUM 8.8*  --  8.1* 8.2* 8.4* 8.5*   Liver Function Tests: Recent Labs  Lab 11/10/23 2130  AST 17  ALT 14  ALKPHOS 83  BILITOT 0.6  PROT 6.4*  ALBUMIN 3.0*   No results for input(s): "LIPASE", "AMYLASE" in the last 168 hours. No results for input(s): "AMMONIA" in the last 168 hours. CBC: Recent Labs  Lab 11/10/23 2130 11/10/23 2145 11/11/23 2103 11/12/23 0528 11/12/23 1410 11/13/23 0323 11/14/23 0333 11/15/23 0330 11/16/23 0401  WBC 13.1*   < > 12.0* 9.7  --  8.6 7.0 6.8  --   NEUTROABS 9.3*  --   --   --   --    --   --   --   --   HGB 8.3*   < > 7.1* 6.4* 8.9* 7.7* 8.3* 7.5* 8.3*  HCT 26.0*   < > 21.5* 20.4* 27.5* 23.3* 25.3* 23.3* 24.3*  MCV 102.0*   < > 90.7 94.4  --  92.8 95.5 94.3  --  PLT 260   < > 195 193  --  181 189 225  --    < > = values in this interval not displayed.   Cardiac Enzymes: No results for input(s): "CKTOTAL", "CKMB", "CKMBINDEX", "TROPONINI" in the last 168 hours. BNP: Invalid input(s): "POCBNP" CBG: Recent Labs  Lab 11/15/23 0613 11/15/23 1054 11/15/23 1559 11/15/23 2130 11/16/23 0616  GLUCAP 121* 159* 190* 183* 149*   D-Dimer No results for input(s): "DDIMER" in the last 72 hours. Hgb A1c No results for input(s): "HGBA1C" in the last 72 hours. Lipid Profile No results for input(s): "CHOL", "HDL", "LDLCALC", "TRIG", "CHOLHDL", "LDLDIRECT" in the last 72 hours. Thyroid function studies No results for input(s): "TSH", "T4TOTAL", "T3FREE", "THYROIDAB" in the last 72 hours.  Invalid input(s): "FREET3" Anemia work up No results for input(s): "VITAMINB12", "FOLATE", "FERRITIN", "TIBC", "IRON", "RETICCTPCT" in the last 72 hours. Urinalysis    Component Value Date/Time   COLORURINE YELLOW 08/29/2023 1716   APPEARANCEUR CLEAR 08/29/2023 1716   LABSPEC 1.026 08/29/2023 1716   PHURINE 5.0 08/29/2023 1716   GLUCOSEU >=500 (A) 08/29/2023 1716   HGBUR NEGATIVE 08/29/2023 1716   BILIRUBINUR NEGATIVE 08/29/2023 1716   KETONESUR NEGATIVE 08/29/2023 1716   PROTEINUR NEGATIVE 08/29/2023 1716   NITRITE POSITIVE (A) 08/29/2023 1716   LEUKOCYTESUR NEGATIVE 08/29/2023 1716   Sepsis Labs Recent Labs  Lab 11/12/23 0528 11/13/23 0323 11/14/23 0333 11/15/23 0330  WBC 9.7 8.6 7.0 6.8   Microbiology No results found for this or any previous visit (from the past 240 hours).   Time coordinating discharge: Over 30 minutes  SIGNED:   Alvira Philips [place], DO  [facility] 11/16/2023, 9:29 AM

## 2023-12-04 ENCOUNTER — Ambulatory Visit: Payer: 59 | Admitting: Sports Medicine

## 2023-12-13 NOTE — Addendum Note (Signed)
Addended by: Geralyn Flash D on: 12/13/2023 02:01 PM   Modules accepted: Orders

## 2023-12-13 NOTE — Progress Notes (Signed)
Remote pacemaker transmission.   

## 2024-01-10 ENCOUNTER — Ambulatory Visit (HOSPITAL_COMMUNITY)
Admission: RE | Admit: 2024-01-10 | Discharge: 2024-01-10 | Disposition: A | Discharge status: Home / Self Care | Payer: 59 | Source: Ambulatory Visit | Attending: Physician Assistant | Admitting: Physician Assistant

## 2024-01-10 VITALS — BP 120/64 | HR 80 | Ht 64.0 in | Wt 156.2 lb

## 2024-01-10 DIAGNOSIS — Z79899 Other long term (current) drug therapy: Secondary | ICD-10-CM | POA: Diagnosis not present

## 2024-01-10 DIAGNOSIS — Z7902 Long term (current) use of antithrombotics/antiplatelets: Secondary | ICD-10-CM | POA: Diagnosis not present

## 2024-01-10 DIAGNOSIS — Z950 Presence of cardiac pacemaker: Secondary | ICD-10-CM | POA: Insufficient documentation

## 2024-01-10 DIAGNOSIS — D6869 Other thrombophilia: Secondary | ICD-10-CM | POA: Insufficient documentation

## 2024-01-10 DIAGNOSIS — Z8673 Personal history of transient ischemic attack (TIA), and cerebral infarction without residual deficits: Secondary | ICD-10-CM | POA: Diagnosis not present

## 2024-01-10 DIAGNOSIS — Z7901 Long term (current) use of anticoagulants: Secondary | ICD-10-CM | POA: Insufficient documentation

## 2024-01-10 DIAGNOSIS — I480 Paroxysmal atrial fibrillation: Secondary | ICD-10-CM | POA: Diagnosis present

## 2024-01-10 DIAGNOSIS — Z7982 Long term (current) use of aspirin: Secondary | ICD-10-CM | POA: Insufficient documentation

## 2024-01-10 DIAGNOSIS — I442 Atrioventricular block, complete: Secondary | ICD-10-CM | POA: Diagnosis not present

## 2024-01-10 DIAGNOSIS — I10 Essential (primary) hypertension: Secondary | ICD-10-CM | POA: Diagnosis not present

## 2024-01-10 DIAGNOSIS — Z7984 Long term (current) use of oral hypoglycemic drugs: Secondary | ICD-10-CM | POA: Insufficient documentation

## 2024-01-10 DIAGNOSIS — E1151 Type 2 diabetes mellitus with diabetic peripheral angiopathy without gangrene: Secondary | ICD-10-CM | POA: Diagnosis not present

## 2024-01-10 DIAGNOSIS — I2510 Atherosclerotic heart disease of native coronary artery without angina pectoris: Secondary | ICD-10-CM | POA: Insufficient documentation

## 2024-01-10 DIAGNOSIS — E785 Hyperlipidemia, unspecified: Secondary | ICD-10-CM | POA: Diagnosis not present

## 2024-01-10 NOTE — Progress Notes (Signed)
Primary Care Physician: Fleet Contras, MD Primary Cardiologist: Donato Schultz, MD Electrophysiologist: Lanier Prude, MD  Referring Physician: Device clinic    Amanda Stewart is a 83 y.o. 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  The patient was initially diagnosed with atrial fibrillation 10/03/23 on a remote device alert. The episode started 11/4 and ended 11/7. She was started on Eliquis for a CHADS2VASC score of 8.  She was admitted 11/10/23 with acute blood loss anemia due to a dental extraction. Her antiplatelet medications and anticoagulation were temporarily held. She received 2 units of PRBC. Her DAPT and Eliquis were resumed.   Patient returns for follow up for atrial fibrillation. Patient reports that she has done well since her last visit from a cardiac standpoint. She remains in SR. No further bleeding issues or bruising.   Today, he denies symptoms of palpitations, chest pain, shortness of breath, orthopnea, PND, lower extremity edema, dizziness, presyncope, syncope, snoring, daytime somnolence, bleeding, or neurologic sequela. The patient is tolerating medications without difficulties and is otherwise without complaint today.    Atrial Fibrillation Risk Factors:  she does not have symptoms or diagnosis of sleep apnea. she does not have a history of rheumatic fever.   Atrial Fibrillation Management history:  Previous antiarrhythmic drugs: none Previous cardioversions: none Previous ablations: none Anticoagulation history: Eliquis  ROS- All systems are reviewed and negative except as per the HPI above.  Past Medical History:  Diagnosis Date   Allergic rhinitis    Anemia    Arrhythmia    CAD (coronary artery disease)    Chest pain    Chronic kidney disease, stage 3a (HCC) 08/11/2023   Combined systolic and  diastolic heart failure (HCC)    Diabetes mellitus without complication (HCC)    Diabetic peripheral neuropathy (HCC)    GERD (gastroesophageal reflux disease)    Heart block    Hyperlipidemia    Hypertension    Left bundle branch block    Low back pain    Myocardial infarction (HCC)    Peripheral arterial disease (HCC)    Retinopathy     Current Outpatient Medications  Medication Sig Dispense Refill   acetaminophen (TYLENOL) 325 MG tablet Take 1-2 tablets (325-650 mg total) by mouth every 4 (four) hours as needed for mild pain. (Patient taking differently: Take 325 mg by mouth as needed for mild pain (pain score 1-3).)     albuterol (VENTOLIN HFA) 108 (90 Base) MCG/ACT inhaler Inhale 2 puffs into the lungs every 6 (six) hours as needed for wheezing or shortness of breath.     amLODipine (NORVASC) 5 MG tablet Take 5 mg by mouth daily.     apixaban (ELIQUIS) 5 MG TABS tablet Take 1 tablet (5 mg total) by mouth 2 (two) times daily. 60 tablet 3   aspirin EC 81 MG tablet Take 81 mg by mouth daily.     atorvastatin (LIPITOR) 80 MG tablet Take 1 tablet (80 mg total) by mouth daily. 30 tablet 3   chlorhexidine (PERIDEX) 0.12 % solution Use as directed 5 mLs in the mouth or throat 4 (four) times daily.     clopidogrel (PLAVIX) 75 MG tablet Take 1 tablet (75 mg total) by mouth daily. 39 tablet 3   dorzolamide (TRUSOPT) 2 % ophthalmic solution Place 1 drop into both  eyes 2 (two) times daily.     empagliflozin (JARDIANCE) 25 MG TABS tablet Take 25 mg by mouth daily.     ibuprofen (ADVIL) 800 MG tablet Take 800 mg by mouth as needed.     losartan (COZAAR) 100 MG tablet Take 100 mg by mouth daily.     metoprolol tartrate (LOPRESSOR) 25 MG tablet Take 1 tablet (25 mg total) by mouth 2 (two) times daily. (Patient taking differently: Take 25 mg by mouth every morning.) 60 tablet 3   Multiple Vitamin (MULTIVITAMIN WITH MINERALS) TABS tablet Take 1 tablet by mouth daily.     nitroGLYCERIN (NITROSTAT) 0.4  MG SL tablet Place 1 tablet (0.4 mg total) under the tongue every 5 (five) minutes x 3 doses as needed for chest pain. 25 tablet 1   pantoprazole (PROTONIX) 40 MG tablet Take 1 tablet (40 mg total) by mouth daily at 6 (six) AM. 30 tablet 3   potassium chloride (KLOR-CON) 10 MEQ tablet Take 10 mEq by mouth daily.     No current facility-administered medications for this encounter.    Physical Exam: BP 120/64   Pulse 80   Ht 5\' 4"  (1.626 m)   Wt 70.9 kg   BMI 26.81 kg/m   GEN: Well nourished, well developed in no acute distress CARDIAC: Regular rate and rhythm, no murmurs, rubs, gallops RESPIRATORY:  Clear to auscultation without rales, wheezing or rhonchi  ABDOMEN: Soft, non-tender, non-distended EXTREMITIES:  No edema; No deformity    Wt Readings from Last 3 Encounters:  01/10/24 70.9 kg  11/11/23 71.6 kg  10/05/23 71.5 kg     EKG today demonstrates  A sense V paced rhythm Vent. rate 80 BPM PR interval 196 ms QRS duration 164 ms QT/QTcB 458/528 ms   Echo 08/12/23 demonstrated   1. Left ventricular ejection fraction, by estimation, is 65 to 70%. The  left ventricle has normal function. The left ventricle has no regional  wall motion abnormalities. 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 Elevated left ventricular end-diastolic pressure.   2. Right ventricular systolic function is normal. The right ventricular  size is normal. There is normal pulmonary artery systolic pressure.   3. Left atrial size was mildly dilated.   4. The mitral valve is normal in structure. No evidence of mitral valve  regurgitation. Moderate mitral annular calcification.   5. The aortic valve is calcified. Aortic valve regurgitation is not  visualized. Mild to moderate aortic valve stenosis. Aortic valve area, by  VTI measures 1.38 cm. Aortic valve mean gradient measures 17.5 mmHg.    CHA2DS2-VASc Score = 8  The patient's score is based upon: CHF  History: 0 HTN History: 1 Diabetes History: 1 Stroke History: 2 Vascular Disease History: 1 Age Score: 2 Gender Score: 1       ASSESSMENT AND PLAN: Paroxysmal Atrial Fibrillation (ICD10:  I48.0) The patient's CHA2DS2-VASc score is 8, indicating a 10.8% annual risk of stroke.   Patient appears to be maintaining SR.  Continue Eliquis 5 mg BID. She continues on triple therapy with ASA, Plavix and Eliquis. No current bleeding issues. Will message vascular team to see if Plavix can be discontinued at this time.  Continue Lopressor 25 mg BID  Secondary Hypercoagulable State (ICD10:  D68.69) The patient is at significant risk for stroke/thromboembolism based upon her CHA2DS2-VASc Score of 8.  Continue Apixaban (Eliquis).   CHB S/p PPM, followed by Dr Lalla Brothers and the device clinic  CAD No  anginal symptoms Followed by Dr Anne Fu  HTN Stable on current regimen   Follow up with Dr Lalla Brothers as scheduled.    Addendum: D/w Dr Roderic Ovens, OK to stop Plavix at this time.    Jorja Loa PA-C Afib Clinic [facility] [address] [place], [place] [address] [phone]

## 2024-01-11 ENCOUNTER — Other Ambulatory Visit (HOSPITAL_COMMUNITY): Payer: Self-pay | Admitting: *Deleted

## 2024-01-11 NOTE — Addendum Note (Signed)
Encounter addended by: Danice Goltz, PA on: 01/11/2024 8:29 AM  Actions taken: Clinical Note Signed

## 2024-01-31 ENCOUNTER — Other Ambulatory Visit: Payer: Self-pay | Admitting: Internal Medicine

## 2024-01-31 NOTE — Telephone Encounter (Signed)
 Prescription refill request for Eliquis received. Indication: Stroke Afib  Last office visit: Fenton, 01/10/2024 Scr: 1.05, 11/15/2023 Age: 83 yo  Weight: 70.9 kg   Refill sent.

## 2024-02-04 ENCOUNTER — Ambulatory Visit (INDEPENDENT_AMBULATORY_CARE_PROVIDER_SITE_OTHER): Payer: Medicare Other

## 2024-02-04 DIAGNOSIS — I442 Atrioventricular block, complete: Secondary | ICD-10-CM | POA: Diagnosis not present

## 2024-02-06 LAB — CUP PACEART REMOTE DEVICE CHECK
Battery Remaining Longevity: 94 mo
Battery Remaining Percentage: 78 %
Battery Voltage: 3.01 V
Brady Statistic AP VP Percent: 10 %
Brady Statistic AP VS Percent: 1 %
Brady Statistic AS VP Percent: 85 %
Brady Statistic AS VS Percent: 4.3 %
Brady Statistic RA Percent Paced: 9.1 %
Brady Statistic RV Percent Paced: 93 %
Date Time Interrogation Session: 20250310030018
Implantable Lead Connection Status: 753985
Implantable Lead Connection Status: 753985
Implantable Lead Implant Date: 20220912
Implantable Lead Implant Date: 20220912
Implantable Lead Location: 753859
Implantable Lead Location: 753860
Implantable Pulse Generator Implant Date: 20220912
Lead Channel Impedance Value: 390 Ohm
Lead Channel Impedance Value: 480 Ohm
Lead Channel Pacing Threshold Amplitude: 0.625 V
Lead Channel Pacing Threshold Amplitude: 0.625 V
Lead Channel Pacing Threshold Pulse Width: 0.5 ms
Lead Channel Pacing Threshold Pulse Width: 0.5 ms
Lead Channel Sensing Intrinsic Amplitude: 12 mV
Lead Channel Sensing Intrinsic Amplitude: 2.7 mV
Lead Channel Setting Pacing Amplitude: 0.875
Lead Channel Setting Pacing Amplitude: 1.625
Lead Channel Setting Pacing Pulse Width: 0.5 ms
Lead Channel Setting Sensing Sensitivity: 4 mV
Pulse Gen Model: 2272
Pulse Gen Serial Number: 3956661

## 2024-02-07 ENCOUNTER — Encounter: Payer: Self-pay | Admitting: Cardiology

## 2024-02-13 NOTE — Progress Notes (Unsigned)
  Electrophysiology Office Follow up Visit Note:    Date:  02/14/2024   ID:  Amanda Stewart, DOB Aug 31, 1941, MRN [id number]  PCP:  Fleet Contras, MD  [facility] Cardiologist:  Donato Schultz, MD  [facility] Electrophysiologist:  Lanier Prude, MD    Interval History:     Amanda Stewart is a 83 y.o. female who presents for a follow up visit.   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  She most recently saw Amanda Stewart in the A-fib clinic on January 10, 2024.  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  She was first diagnosed with atrial fibrillation October 03, 2023 via remote device interrogation.  She is on Eliquis for stroke prophylaxis.  She was admitted in December 2024 with acute blood loss anemia after a dental extraction.  She received 2 units of PRBCs.  Her Plavix was discontinued but she was maintained on Eliquis.        Past medical, surgical, social and family history were reviewed.  ROS:   Please see the history of present illness.    All other systems reviewed and are negative.  EKGs/Labs/Other Studies Reviewed:    The following studies were reviewed today:  February 14, 2024 in clinic device interrogation personally reviewed Dual-chamber permanent pacemaker in situ Device functioning appropriately Lead parameters are stable Battery parameters stable No programming changes made today        Physical Exam:    VS:  BP 98/68 (BP Location: Right Arm, Patient Position: Sitting, Cuff Size: Normal)   Pulse 70   Ht 5\' 4"  (1.626 m)   Wt 157 lb 12.8 oz (71.6 kg)   SpO2 97%   BMI 27.09 kg/m     Wt Readings from Last 3 Encounters:  02/14/24 157 lb 12.8 oz (71.6 kg)  01/10/24 156 lb 3.2 oz (70.9 kg)  11/11/23 157 lb 13.6 oz (71.6 kg)     GEN: no  distress CARD: RRR, No MRG.  CIED pocket well-healed RESP: No IWOB. CTAB.      ASSESSMENT:    1. Paroxysmal atrial fibrillation (HCC)   2. Complete heart block (HCC)   3. Cardiac pacemaker in situ    PLAN:    In order of problems listed above:  #Permanent pacemaker in situ #Complete heart block Device functioning appropriately.  Continue remote monitoring.  #Paroxysmal atrial fibrillation On Eliquis for stroke prophylaxis.  Therapy has been complicated by severe bleeding from dental extraction.  She has restarted this therapy and is tolerating it.  Follow-up 1 year with APP.  Signed, Steffanie Dunn, MD, [facility], [facility] 2 02/14/2024 8:35 AM    Electrophysiology [facility]

## 2024-02-14 ENCOUNTER — Ambulatory Visit: Payer: 59 | Attending: Cardiology | Admitting: Cardiology

## 2024-02-14 ENCOUNTER — Encounter: Payer: Self-pay | Admitting: Cardiology

## 2024-02-14 VITALS — BP 98/68 | HR 70 | Ht 64.0 in | Wt 157.8 lb

## 2024-02-14 DIAGNOSIS — I442 Atrioventricular block, complete: Secondary | ICD-10-CM | POA: Diagnosis not present

## 2024-02-14 DIAGNOSIS — Z950 Presence of cardiac pacemaker: Secondary | ICD-10-CM

## 2024-02-14 DIAGNOSIS — I480 Paroxysmal atrial fibrillation: Secondary | ICD-10-CM

## 2024-02-14 LAB — CUP PACEART INCLINIC DEVICE CHECK
Battery Remaining Longevity: 97 mo
Battery Voltage: 3.01 V
Brady Statistic RA Percent Paced: 9.6 %
Brady Statistic RV Percent Paced: 94 %
Date Time Interrogation Session: 20250320151108
Implantable Lead Connection Status: 753985
Implantable Lead Connection Status: 753985
Implantable Lead Implant Date: 20220912
Implantable Lead Implant Date: 20220912
Implantable Lead Location: 753859
Implantable Lead Location: 753860
Implantable Pulse Generator Implant Date: 20220912
Lead Channel Impedance Value: 412.5 Ohm
Lead Channel Impedance Value: 537.5 Ohm
Lead Channel Pacing Threshold Amplitude: 0.75 V
Lead Channel Pacing Threshold Amplitude: 0.75 V
Lead Channel Pacing Threshold Amplitude: 0.75 V
Lead Channel Pacing Threshold Amplitude: 0.75 V
Lead Channel Pacing Threshold Pulse Width: 0.5 ms
Lead Channel Pacing Threshold Pulse Width: 0.5 ms
Lead Channel Pacing Threshold Pulse Width: 0.5 ms
Lead Channel Pacing Threshold Pulse Width: 0.5 ms
Lead Channel Sensing Intrinsic Amplitude: 12 mV
Lead Channel Sensing Intrinsic Amplitude: 4.4 mV
Lead Channel Setting Pacing Amplitude: 0.875
Lead Channel Setting Pacing Amplitude: 1.625
Lead Channel Setting Pacing Pulse Width: 0.5 ms
Lead Channel Setting Sensing Sensitivity: 4 mV
Pulse Gen Model: 2272
Pulse Gen Serial Number: 3956661

## 2024-02-14 NOTE — Patient Instructions (Signed)
 Medication Instructions:  Your physician recommends that you continue on your current medications as directed. Please refer to the Current Medication list given to you today.  *If you need a refill on your cardiac medications before your next appointment, please call your pharmacy*  Follow-Up: At [facility], you and your health needs are our priority.  As part of our continuing mission to provide you with exceptional heart care, we have created designated Provider Care Teams.  These Care Teams include your primary Cardiologist (physician) and Advanced Practice Providers (APPs -  Physician Assistants and Nurse Practitioners) who all work together to provide you with the care you need, when you need it.  Your next appointment:   1 year  Provider:   You will see one of the following Advanced Practice Providers on your designated Care Team:   Francis Dowse, [place] Casimiro Needle "Mardelle Matte" [place], PA-C Sherie Don, NP Canary Brim, NP    1st Floor: - Lobby - Registration  - Pharmacy  - Lab - Cafe  2nd Floor: - PV Lab - Diagnostic Testing (echo, CT, nuclear med)  3rd Floor: - Vacant  4th Floor: - TCTS (cardiothoracic surgery) - AFib Clinic - Structural Heart Clinic - Vascular Surgery  - Vascular Ultrasound  5th Floor: - HeartCare Cardiology (general and EP) - Clinical Pharmacy for coumadin, hypertension, lipid, weight-loss medications, and med management appointments    Valet parking services will be available as well.

## 2024-02-17 ENCOUNTER — Encounter: Payer: Self-pay | Admitting: Cardiology

## 2024-03-07 ENCOUNTER — Other Ambulatory Visit: Payer: Self-pay | Admitting: Internal Medicine

## 2024-03-07 ENCOUNTER — Telehealth: Payer: Self-pay | Admitting: Cardiology

## 2024-03-07 NOTE — Telephone Encounter (Signed)
 Prescription refill request for Eliquis received. Indication:afib Last office visit:3/25 Scr:1.05  12/24 Age: 83 Weight:71.6  kg  Prescription refilled

## 2024-03-07 NOTE — Telephone Encounter (Signed)
Prescription refilled this morning.

## 2024-03-07 NOTE — Telephone Encounter (Signed)
*  STAT* If patient is at the pharmacy, call can be transferred to refill team.   1. Which medications need to be refilled? (please list name of each medication and dose if known)  apixaban (ELIQUIS) 5 MG TABS tablet  2. Which pharmacy/location (including street and city if local pharmacy) is medication to be sent to? [id number] - [place], [place] - [address] AT [facility] OF [address]  3. Do they need a 30 day or 90 day supply?   90 day supply

## 2024-03-13 ENCOUNTER — Ambulatory Visit (INDEPENDENT_AMBULATORY_CARE_PROVIDER_SITE_OTHER): Admitting: Cardiology

## 2024-03-13 VITALS — BP 128/67 | HR 70 | Ht 64.0 in | Wt 159.3 lb

## 2024-03-13 DIAGNOSIS — I480 Paroxysmal atrial fibrillation: Secondary | ICD-10-CM | POA: Diagnosis not present

## 2024-03-13 DIAGNOSIS — Z950 Presence of cardiac pacemaker: Secondary | ICD-10-CM

## 2024-03-13 DIAGNOSIS — I739 Peripheral vascular disease, unspecified: Secondary | ICD-10-CM

## 2024-03-13 DIAGNOSIS — I442 Atrioventricular block, complete: Secondary | ICD-10-CM | POA: Diagnosis not present

## 2024-03-13 DIAGNOSIS — D6869 Other thrombophilia: Secondary | ICD-10-CM | POA: Diagnosis not present

## 2024-03-13 DIAGNOSIS — N1831 Chronic kidney disease, stage 3a: Secondary | ICD-10-CM

## 2024-03-13 DIAGNOSIS — E119 Type 2 diabetes mellitus without complications: Secondary | ICD-10-CM

## 2024-03-13 DIAGNOSIS — I2510 Atherosclerotic heart disease of native coronary artery without angina pectoris: Secondary | ICD-10-CM

## 2024-03-13 DIAGNOSIS — I10 Essential (primary) hypertension: Secondary | ICD-10-CM

## 2024-03-13 NOTE — Patient Instructions (Signed)

## 2024-03-13 NOTE — Progress Notes (Signed)
 Cardiology Office Note:  .   Date:  03/13/2024  ID:  Boyd Cabal, DOB 1941/05/01, MRN [id number] PCP: Charle Congo, MD  [facility] Providers Cardiologist:  Dorothye Gathers, MD Electrophysiologist:  Boyce Byes, MD    History of Present Illness: .   Amanda Stewart is a 83 y.o. female Discussed the use of AI scribe software for clinical note transcription with the patient, who gave verbal consent to proceed.  History of Present Illness Amanda Stewart is an 83 year old female with atrial fibrillation, hypertension, hyperlipidemia, diabetes, and peripheral arterial disease who presents for follow-up.  She has a history of atrial fibrillation, diagnosed on October 03, 2023, via remote interrogation of her pacemaker. She is on Eliquis 5 mg twice daily for stroke prophylaxis and has tolerated it well since a bleeding episode in December 2024, when she experienced acute blood loss anemia after a dental extraction and received two units of blood. Her Plavix was discontinued at that time, but she remains on Eliquis and also takes a low dose aspirin.  Her pacemaker, implanted in September 2022 due to complete heart block, is functioning well. She is followed in electrophysiology and reports no issues with the pacemaker, stating it is 'clicking along' well.  She has a history of coronary artery disease with a 90% stenosis in the AV groove circumflex small caliber vessel, identified during a heart catheterization in 2015. Additionally, she has peripheral arterial disease, with moderate involvement on the right and mild on the left. She experiences tingling and pain in her legs at times but manages by moving around.  She also has chronic kidney disease stage 3A, with a prior creatinine level of 1.3, and the most recent level being 1.05.  Her echocardiogram from August 12, 2023, showed normal pump function with a 70% ejection fraction, mild left atrial dilation, and mild to moderate aortic valve  stenosis with a mean gradient of 17 mmHg. No new symptoms related to these findings.      ROS: No CP, no bleeding, uses walker  Studies Reviewed: .        Results LABS Cr: 1.05 (03/13/2024)  DIAGNOSTIC Echocardiogram: Normal pump function, EF 70%, mildly dilated left atrium, calcified aortic valve, mild to moderate aortic valve stenosis, mean gradient 17 mmHg (08/12/2023) Risk Assessment/Calculations:            Physical Exam:   VS:  BP 128/67   Pulse 70   Ht 5\' 4"  (1.626 m)   Wt 159 lb 4.8 oz (72.3 kg)   SpO2 97%   BMI 27.34 kg/m    Wt Readings from Last 3 Encounters:  03/13/24 159 lb 4.8 oz (72.3 kg)  02/14/24 157 lb 12.8 oz (71.6 kg)  01/10/24 156 lb 3.2 oz (70.9 kg)    GEN: Well nourished, well developed in no acute distress NECK: No JVD; No carotid bruits CARDIAC: RRR, 2/6 SM, no rubs, no gallops RESPIRATORY:  Clear to auscultation without rales, wheezing or rhonchi  ABDOMEN: Soft, non-tender, non-distended EXTREMITIES:  No edema; No deformity   ASSESSMENT AND PLAN: .    Assessment and Plan Assessment & Plan Atrial fibrillation Diagnosed on October 03, 2023, via remote interrogation of pacemaker. Currently on Eliquis for stroke prophylaxis with no further bleeding episodes since restarting after acute blood loss anemia post-dental extraction. Challenges with obtaining Eliquis were discussed, including potential solutions such as switching to a hospital-associated pharmacy. - Continue Eliquis 5 mg twice daily for stroke prophylaxis -  Consider switching to a hospital-associated pharmacy for better medication availability and management  Complete heart block with pacemaker Pacemaker implanted in September 2022 due to complete heart block. Device is functioning well and followed by Dr. Marven Slimmer in EP.  Coronary artery disease Coronary artery disease with 90% stenosis in AV groove circumflex small caliber vessel noted on heart catheterization in  2015.  Peripheral arterial disease Moderate involvement on the right and mild on the left. Symptoms include tingling and pain in the legs, which improve with movement. Encouraged to keep moving to alleviate symptoms. - Encourage regular movement to alleviate symptoms  Aortic valve stenosis Mild to moderate aortic valve stenosis with a mean gradient of 17 mmHg. Echocardiogram from August 12, 2023, showed normal pump function with 70% EF and mildly dilated left atrium. Discussed potential need for valve replacement if stenosis progresses to severe, with the option of a less invasive procedure similar to heart catheterization. - Repeat echocardiogram next year to monitor aortic valve stenosis progression  Chronic kidney disease stage 3a Chronic kidney disease stage 3a with prior creatinine of 1.3, most recent creatinine is 1.05.  Follow-up Yearly follow-up planned to monitor conditions and adjust treatment as necessary. - Schedule a follow-up appointment in one year          Signed, Dorothye Gathers, MD

## 2024-03-17 NOTE — Progress Notes (Signed)
 Remote pacemaker transmission.

## 2024-03-17 NOTE — Addendum Note (Signed)
 Addended by: Edra Govern D on: 03/17/2024 03:43 PM   Modules accepted: Orders

## 2024-03-27 ENCOUNTER — Ambulatory Visit: Payer: 59 | Admitting: Cardiology

## 2024-04-09 NOTE — Progress Notes (Deleted)
 Patient ID: Amanda Stewart, female   DOB: 1941/07/06, 83 y.o.   MRN: [id number]  Reason for Consult: No chief complaint on file.   Referred by Charle Congo, MD  Subjective:  HPI Amanda Stewart is a 83 y.o. female who underwent right TCAR for symptomatic right carotid stenosis.  She denies any strokelike symptoms.  Specifically she denies any one-sided weakness, numbness, amaurosis or speech issues.   Past Medical History:  Diagnosis Date   Allergic rhinitis    Anemia    Arrhythmia    CAD (coronary artery disease)    Chest pain    Chronic kidney disease, stage 3a (HCC) 08/11/2023   Combined systolic and diastolic heart failure (HCC)    Diabetes mellitus without complication (HCC)    Diabetic peripheral neuropathy (HCC)    GERD (gastroesophageal reflux disease)    Heart block    Hyperlipidemia    Hypertension    Left bundle branch block    Low back pain    Myocardial infarction (HCC)    Peripheral arterial disease (HCC)    Retinopathy    Family History  Problem Relation Age of Onset   Diabetes Mother    Diabetes Father    Stroke Father    Past Surgical History:  Procedure Laterality Date   ABDOMINAL HYSTERECTOMY     LEFT AND RIGHT HEART CATHETERIZATION WITH CORONARY ANGIOGRAM N/A 05/26/2014   Procedure: LEFT AND RIGHT HEART CATHETERIZATION WITH CORONARY ANGIOGRAM;  Surgeon: Darrold Emms, MD;  Location: [facility] CATH LAB;  Service: Cardiovascular;  Laterality: N/A;   PACEMAKER IMPLANT N/A 08/08/2021   Procedure: PACEMAKER IMPLANT;  Surgeon: Boyce Byes, MD;  Location: [facility] INVASIVE CV LAB;  Service: Cardiovascular;  Laterality: N/A;   TEMPORARY PACEMAKER N/A 08/07/2021   Procedure: TEMPORARY PACEMAKER;  Surgeon: Chapman Commodore, MD;  Location: [facility] INVASIVE CV LAB;  Service: Cardiovascular;  Laterality: N/A;   TRANSCAROTID ARTERY REVASCULARIZATION  Right 09/04/2023   Procedure: Transcarotid Artery Revascularization;  Surgeon: Philipp Brawn, MD;  Location: [facility] OR;  Service:  Vascular;  Laterality: Right;   ULTRASOUND GUIDANCE FOR VASCULAR ACCESS Left 09/04/2023   Procedure: ULTRASOUND GUIDANCE FOR VASCULAR ACCESS;  Surgeon: Philipp Brawn, MD;  Location: [facility] OR;  Service: Vascular;  Laterality: Left;    Short Social History:  Social History   Tobacco Use   Smoking status: Never   Smokeless tobacco: Never   Tobacco comments:    Never smoked 10/05/23  Substance Use Topics   Alcohol use: Yes    Alcohol/week: 1.0 standard drink of alcohol    Types: 1 Glasses of wine per week    Comment: 1 glass of wine once a month 10/05/23    Allergies  Allergen Reactions   Metformin  And Related Other (See Comments)    Diarrhea     Current Outpatient Medications  Medication Sig Dispense Refill   acetaminophen  (TYLENOL ) 325 MG tablet Take 1-2 tablets (325-650 mg total) by mouth every 4 (four) hours as needed for mild pain. (Patient taking differently: Take 325 mg by mouth as needed for mild pain (pain score 1-3).)     albuterol  (VENTOLIN  HFA) 108 (90 Base) MCG/ACT inhaler Inhale 2 puffs into the lungs every 6 (six) hours as needed for wheezing or shortness of breath.     amLODipine  (NORVASC ) 5 MG tablet Take 5 mg by mouth daily.     apixaban  (ELIQUIS ) 5 MG TABS tablet TAKE 1 TABLET(5 MG) BY MOUTH TWICE DAILY 60 tablet 5  aspirin  EC 81 MG tablet Take 81 mg by mouth daily.     atorvastatin  (LIPITOR ) 80 MG tablet Take 1 tablet (80 mg total) by mouth daily. 30 tablet 3   chlorhexidine  (PERIDEX ) 0.12 % solution Use as directed 5 mLs in the mouth or throat 4 (four) times daily.     dorzolamide  (TRUSOPT ) 2 % ophthalmic solution Place 1 drop into both eyes 2 (two) times daily.     empagliflozin  (JARDIANCE ) 25 MG TABS tablet Take 25 mg by mouth daily.     ibuprofen (ADVIL) 800 MG tablet Take 800 mg by mouth as needed.     losartan  (COZAAR ) 100 MG tablet Take 100 mg by mouth daily.     metoprolol  tartrate (LOPRESSOR ) 25 MG tablet Take 1 tablet (25 mg total) by mouth 2 (two)  times daily. (Patient taking differently: Take 25 mg by mouth every morning.) 60 tablet 3   Multiple Vitamin (MULTIVITAMIN WITH MINERALS) TABS tablet Take 1 tablet by mouth daily.     nitroGLYCERIN  (NITROSTAT ) 0.4 MG SL tablet Place 1 tablet (0.4 mg total) under the tongue every 5 (five) minutes x 3 doses as needed for chest pain. 25 tablet 1   pantoprazole  (PROTONIX ) 40 MG tablet Take 1 tablet (40 mg total) by mouth daily at 6 (six) AM. 30 tablet 3   potassium chloride  (KLOR-CON ) 10 MEQ tablet Take 10 mEq by mouth daily.     No current facility-administered medications for this visit.    REVIEW OF SYSTEMS  All other systems reviewed and are negative  Objective:  Objective   There were no vitals filed for this visit. There is no height or weight on file to calculate BMI.  Physical Exam General: no acute distress Cardiac: hemodynamically stable Pulm: normal work of breathing Abdomen: non-tender, no pulsatile mass*** Neuro: alert, no focal deficit Extremities: no edema, cyanosis or wounds*** Vascular:   Right: ***  Left: ***  Data: Carotid duplex ***     Assessment/Plan:     ESA CADD is a 83 y.o. female with underwent TCAR with shockwave lithotripsy on 09/04/2023 for symptomatic right carotid stenosis.  She has recovered well and duplex demonstrates a widely patent stent without evidence of restenosis.  She has since discontinued Plavix  and is on aspirin  and Eliquis .  Plan to follow-up in 6 months with repeat carotid duplex     Philipp Brawn MD [facility]

## 2024-04-11 ENCOUNTER — Ambulatory Visit (HOSPITAL_COMMUNITY): Payer: 59

## 2024-04-11 ENCOUNTER — Ambulatory Visit: Payer: 59 | Admitting: Vascular Surgery

## 2024-04-30 ENCOUNTER — Telehealth: Payer: Self-pay

## 2024-04-30 NOTE — Telephone Encounter (Signed)
 Alert remote transmission:  Exceed AT/AF AF in progress from 5/26 @ 02:46, controlled rates, Eliquis  per EPIC   Received alert from device that patient has been in ongoing Afib for past 9 days.  Is followed by R. Fenton PA in AF clinic, will forward to their team for follow up.

## 2024-05-01 ENCOUNTER — Encounter (HOSPITAL_COMMUNITY): Payer: Self-pay

## 2024-05-05 ENCOUNTER — Ambulatory Visit (INDEPENDENT_AMBULATORY_CARE_PROVIDER_SITE_OTHER): Payer: Medicare Other

## 2024-05-05 DIAGNOSIS — I442 Atrioventricular block, complete: Secondary | ICD-10-CM | POA: Diagnosis not present

## 2024-05-06 ENCOUNTER — Ambulatory Visit (HOSPITAL_COMMUNITY): Admitting: Physician Assistant

## 2024-05-06 LAB — CUP PACEART REMOTE DEVICE CHECK
Battery Remaining Longevity: 92 mo
Battery Remaining Percentage: 75 %
Battery Voltage: 3.01 V
Brady Statistic AP VP Percent: 18 %
Brady Statistic AP VS Percent: 1 %
Brady Statistic AS VP Percent: 81 %
Brady Statistic AS VS Percent: 1 %
Brady Statistic RA Percent Paced: 14 %
Brady Statistic RV Percent Paced: 91 %
Date Time Interrogation Session: 20250609025038
Implantable Lead Connection Status: 753985
Implantable Lead Connection Status: 753985
Implantable Lead Implant Date: 20220912
Implantable Lead Implant Date: 20220912
Implantable Lead Location: 753859
Implantable Lead Location: 753860
Implantable Pulse Generator Implant Date: 20220912
Lead Channel Impedance Value: 360 Ohm
Lead Channel Impedance Value: 510 Ohm
Lead Channel Pacing Threshold Amplitude: 0.625 V
Lead Channel Pacing Threshold Amplitude: 0.625 V
Lead Channel Pacing Threshold Pulse Width: 0.5 ms
Lead Channel Pacing Threshold Pulse Width: 0.5 ms
Lead Channel Sensing Intrinsic Amplitude: 12 mV
Lead Channel Sensing Intrinsic Amplitude: 3.1 mV
Lead Channel Setting Pacing Amplitude: 0.875
Lead Channel Setting Pacing Amplitude: 1.625
Lead Channel Setting Pacing Pulse Width: 0.5 ms
Lead Channel Setting Sensing Sensitivity: 4 mV
Pulse Gen Model: 2272
Pulse Gen Serial Number: 3956661

## 2024-05-09 ENCOUNTER — Ambulatory Visit: Payer: Self-pay | Admitting: Cardiology

## 2024-05-15 ENCOUNTER — Ambulatory Visit (HOSPITAL_COMMUNITY)
Admission: RE | Admit: 2024-05-15 | Discharge: 2024-05-15 | Disposition: A | Discharge status: Home / Self Care | Source: Ambulatory Visit | Attending: Physician Assistant | Admitting: Physician Assistant

## 2024-05-15 ENCOUNTER — Encounter (HOSPITAL_COMMUNITY): Payer: Self-pay | Admitting: Physician Assistant

## 2024-05-15 VITALS — BP 150/64 | HR 77 | Ht 64.0 in | Wt 161.8 lb

## 2024-05-15 DIAGNOSIS — I484 Atypical atrial flutter: Secondary | ICD-10-CM | POA: Diagnosis not present

## 2024-05-15 DIAGNOSIS — D6869 Other thrombophilia: Secondary | ICD-10-CM | POA: Diagnosis not present

## 2024-05-15 DIAGNOSIS — I480 Paroxysmal atrial fibrillation: Secondary | ICD-10-CM

## 2024-05-15 NOTE — Addendum Note (Signed)
 Encounter addended by: Twana Gal, PA on: 05/15/2024 8:57 AM  Actions taken: Clinical Note Signed

## 2024-05-15 NOTE — Progress Notes (Addendum)
 Primary Care Physician: Charle Congo, MD Primary Cardiologist: Dorothye Gathers, MD Electrophysiologist: Boyce Byes, MD  Referring Physician: Device clinic    Amanda Stewart is a 83 y.o. 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  The patient was initially diagnosed with atrial fibrillation 10/03/23 on a remote device alert. The episode started 11/4 and ended 11/7. She was started on Eliquis  for stroke prevention.  She was admitted 11/10/23 with acute blood loss anemia due to a dental extraction. Her antiplatelet medications and anticoagulation were temporarily held. She received 2 units of PRBC. Her DAPT and Eliquis  were resumed. Plavix  discontinued 12/2023.  Patient returns for follow up for atrial fibrillation. The device clinic received an alert for an ongoing afib episode starting 5/26. Patient states that she has fatigue and intermittent dizziness but these are chronic and predate the onset of her afib. There were no specific triggers but the patient's daughter reports that her A1c was recently >12. Her PCP is following. Patient is decreasing her soda intake. No bleeding issues on anticoagulation.   Today, she  denies symptoms of palpitations, chest pain, shortness of breath, orthopnea, PND, lower extremity edema, dizziness, presyncope, syncope, snoring, daytime somnolence, bleeding, or neurologic sequela. The patient is tolerating medications without difficulties and is otherwise without complaint today.    Atrial Fibrillation Risk Factors:  she does not have symptoms or diagnosis of sleep apnea. she does not have a history of rheumatic fever.   Atrial Fibrillation Management history:  Previous antiarrhythmic drugs: none Previous cardioversions: none Previous ablations: none Anticoagulation history: Eliquis   ROS- All systems are  reviewed and negative except as per the HPI above.  Past Medical History:  Diagnosis Date   Allergic rhinitis    Anemia    Arrhythmia    CAD (coronary artery disease)    Chest pain    Chronic kidney disease, stage 3a (HCC) 08/11/2023   Combined systolic and diastolic heart failure (HCC)    Diabetes mellitus without complication (HCC)    Diabetic peripheral neuropathy (HCC)    GERD (gastroesophageal reflux disease)    Heart block    Hyperlipidemia    Hypertension    Left bundle branch block    Low back pain    Myocardial infarction (HCC)    Peripheral arterial disease (HCC)    Retinopathy     Current Outpatient Medications  Medication Sig Dispense Refill   acetaminophen  (TYLENOL ) 325 MG tablet Take 1-2 tablets (325-650 mg total) by mouth every 4 (four) hours as needed for mild pain.     albuterol  (VENTOLIN  HFA) 108 (90 Base) MCG/ACT inhaler Inhale 2 puffs into the lungs every 6 (six) hours as needed for wheezing or shortness of breath.     amLODipine  (NORVASC ) 5 MG tablet Take 5 mg by mouth daily.     apixaban  (ELIQUIS ) 5 MG TABS tablet TAKE 1 TABLET(5 MG) BY MOUTH TWICE DAILY 60 tablet 5   aspirin  EC 81 MG tablet Take 81 mg by mouth daily.     atorvastatin  (LIPITOR ) 80 MG tablet Take 1 tablet (80 mg total) by mouth daily. 30 tablet 3   chlorhexidine  (PERIDEX ) 0.12 % solution Use as directed 5 mLs in the mouth or throat 4 (four) times daily.     dorzolamide  (TRUSOPT ) 2 % ophthalmic solution Place 1 drop into both  eyes 2 (two) times daily.     empagliflozin  (JARDIANCE ) 25 MG TABS tablet Take 25 mg by mouth daily.     ibuprofen (ADVIL) 800 MG tablet Take 800 mg by mouth as needed.     losartan  (COZAAR ) 100 MG tablet Take 100 mg by mouth daily.     metoprolol  tartrate (LOPRESSOR ) 25 MG tablet Take 1 tablet (25 mg total) by mouth 2 (two) times daily. 60 tablet 3   Multiple Vitamin (MULTIVITAMIN WITH MINERALS) TABS tablet Take 1 tablet by mouth daily.     nitroGLYCERIN  (NITROSTAT )  0.4 MG SL tablet Place 1 tablet (0.4 mg total) under the tongue every 5 (five) minutes x 3 doses as needed for chest pain. 25 tablet 1   pantoprazole  (PROTONIX ) 40 MG tablet Take 1 tablet (40 mg total) by mouth daily at 6 (six) AM. 30 tablet 3   potassium chloride  (KLOR-CON ) 10 MEQ tablet Take 10 mEq by mouth daily.     No current facility-administered medications for this encounter.    Physical Exam: BP (!) 150/64   Pulse 77   Ht 5' 4 (1.626 m)   Wt 73.4 kg   BMI 27.77 kg/m   GEN: Well nourished, well developed in no acute distress NECK: No JVD; No carotid bruits CARDIAC: Regular rate and rhythm, no rubs, gallops, 2/6 systolic murmur  RESPIRATORY:  Clear to auscultation without rales, wheezing or rhonchi  ABDOMEN: Soft, non-tender, non-distended EXTREMITIES:  No edema; No deformity    Wt Readings from Last 3 Encounters:  05/15/24 73.4 kg  03/13/24 72.3 kg  02/14/24 71.6 kg     EKG today demonstrates  V pacing with underlying atypical atrial flutter Vent. rate 77 BPM PR interval * ms QRS duration 146 ms QT/QTcB 434/491 ms   Echo 08/12/23 demonstrated   1. Left ventricular ejection fraction, by estimation, is 65 to 70%. The  left ventricle has normal function. The left ventricle has no regional  wall motion abnormalities. 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 Elevated left ventricular end-diastolic pressure.   2. Right ventricular systolic function is normal. The right ventricular  size is normal. There is normal pulmonary artery systolic pressure.   3. Left atrial size was mildly dilated.   4. The mitral valve is normal in structure. No evidence of mitral valve  regurgitation. Moderate mitral annular calcification.   5. The aortic valve is calcified. Aortic valve regurgitation is not  visualized. Mild to moderate aortic valve stenosis. Aortic valve area, by  VTI measures 1.38 cm. Aortic valve mean gradient measures 17.5 mmHg.     CHA2DS2-VASc Score = 8  The patient's score is based upon: CHF History: 0 HTN History: 1 Diabetes History: 1 Stroke History: 2 Vascular Disease History: 1 Age Score: 2 Gender Score: 1       ASSESSMENT AND PLAN: Persistent Atrial Fibrillation (ICD10:  I48.19) The patient's CHA2DS2-VASc score is 8, indicating a 10.8% annual risk of stroke.   Patient in afib today, ongoing since 5/26 per device clinic report. Unclear how symptomatic she is given her unchanged chronic fatigue. We discussed rate vs rhythm control. She is unsure if she would like to proceed with DCCV given her age and paucity of symptoms. She would like time to discuss with her family and consider her options. Will revisit at follow up.  Continue Eliquis  5 mg BID Continue Lopressor  25 mg BID  Secondary Hypercoagulable State (ICD10:  D68.69) The patient is at significant  risk for stroke/thromboembolism based upon her CHA2DS2-VASc Score of 8.  Continue Apixaban  (Eliquis ). No bleeding issues.  CHB Followed by Dr Marven Slimmer  CAD No anginal symptoms Followed by Dr Renna Cary  HTN Stable on current regimen  DM A1c >12 per daughter, patient trying to decrease her soda intake. Med adjustments per PCP.    Follow up in the AF clinic in 6 weeks.     Myrtha Ates PA-C Afib Clinic [facility] [address] [place], [place] [address] [phone]

## 2024-05-28 ENCOUNTER — Telehealth: Payer: Self-pay | Admitting: Adult Health

## 2024-05-28 NOTE — Telephone Encounter (Signed)
 Daughter called to r/s appointment due to a conflict with another appointment

## 2024-05-29 ENCOUNTER — Encounter: Payer: Self-pay | Admitting: Adult Health

## 2024-05-29 ENCOUNTER — Ambulatory Visit: Payer: 59 | Admitting: Adult Health

## 2024-05-29 ENCOUNTER — Ambulatory Visit: Admitting: Adult Health

## 2024-05-29 NOTE — Telephone Encounter (Signed)
 Appointment details confirmed w/ daughter

## 2024-06-06 ENCOUNTER — Telehealth: Payer: Self-pay | Admitting: Adult Health

## 2024-06-06 NOTE — Telephone Encounter (Signed)
 A request for an earlier appointment

## 2024-06-18 NOTE — Progress Notes (Signed)
 Remote pacemaker transmission.

## 2024-06-18 NOTE — Addendum Note (Signed)
 Addended by: TAWNI DRILLING D on: 06/18/2024 04:40 PM   Modules accepted: Orders

## 2024-06-23 ENCOUNTER — Ambulatory Visit (HOSPITAL_COMMUNITY)
Admission: RE | Admit: 2024-06-23 | Discharge: 2024-06-23 | Disposition: A | Discharge status: Home / Self Care | Source: Ambulatory Visit | Attending: Physician Assistant | Admitting: Physician Assistant

## 2024-06-23 VITALS — BP 142/72 | HR 78 | Ht 64.0 in | Wt 161.0 lb

## 2024-06-23 DIAGNOSIS — I4891 Unspecified atrial fibrillation: Secondary | ICD-10-CM | POA: Diagnosis not present

## 2024-06-23 DIAGNOSIS — I484 Atypical atrial flutter: Secondary | ICD-10-CM | POA: Diagnosis not present

## 2024-06-23 DIAGNOSIS — D6869 Other thrombophilia: Secondary | ICD-10-CM | POA: Diagnosis not present

## 2024-06-23 NOTE — Progress Notes (Addendum)
 Primary Care Physician: Shelda Atlas, MD Primary Cardiologist: Oneil Parchment, MD Electrophysiologist: OLE ONEIDA HOLTS, MD  Referring Physician: Device clinic    Amanda Stewart is a 83 y.o. 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  The patient was initially diagnosed with atrial fibrillation 10/03/23 on a remote device alert. The episode started 11/4 and ended 11/7. She was started on Eliquis  for stroke prevention.  She was admitted 11/10/23 with acute blood loss anemia due to a dental extraction. Her antiplatelet medications and anticoagulation were temporarily held. She received 2 units of PRBC. Her DAPT and Eliquis  were resumed. Plavix  discontinued 12/2023.  The device clinic received an alert for an ongoing afib episode starting 04/21/24. Patient states that she has fatigue and intermittent dizziness but these are chronic and predate the onset of her afib. There were no specific triggers but the patient's daughter reports that her A1c was recently >12. Her PCP is following. Patient deferred DCCV at that time.   Patient returns for follow up for atrial fibrillation and atrial flutter. She reports that she has done well since her last visit. Interestingly, she reports that she has more energy recently. She denies any awareness of her arrhythmia. No bleeding issues on anticoagulation.   Today, she  denies symptoms of palpitations, chest pain, shortness of breath, orthopnea, PND, lower extremity edema, dizziness, presyncope, syncope, snoring, daytime somnolence, bleeding, or neurologic sequela. The patient is tolerating medications without difficulties and is otherwise without complaint today.    Atrial Fibrillation Risk Factors:  she does not have symptoms or diagnosis of sleep apnea. she does not have a history of rheumatic fever.   Atrial  Fibrillation Management history:  Previous antiarrhythmic drugs: none Previous cardioversions: none Previous ablations: none Anticoagulation history: Eliquis   ROS- All systems are reviewed and negative except as per the HPI above.  Past Medical History:  Diagnosis Date   Allergic rhinitis    Anemia    Arrhythmia    CAD (coronary artery disease)    Chest pain    Chronic kidney disease, stage 3a (HCC) 08/11/2023   Combined systolic and diastolic heart failure (HCC)    Diabetes mellitus without complication (HCC)    Diabetic peripheral neuropathy (HCC)    GERD (gastroesophageal reflux disease)    Heart block    Hyperlipidemia    Hypertension    Left bundle branch block    Low back pain    Myocardial infarction (HCC)    Peripheral arterial disease (HCC)    Retinopathy     Current Outpatient Medications  Medication Sig Dispense Refill   acetaminophen  (TYLENOL ) 325 MG tablet Take 1-2 tablets (325-650 mg total) by mouth every 4 (four) hours as needed for mild pain. (Patient taking differently: Take 325-650 mg by mouth as needed for mild pain (pain score 1-3).)     albuterol  (VENTOLIN  HFA) 108 (90 Base) MCG/ACT inhaler Inhale 2 puffs into the lungs every 6 (six) hours as needed for wheezing or shortness of breath.     amLODipine  (NORVASC ) 5 MG tablet Take 5 mg by mouth daily.     apixaban  (ELIQUIS ) 5 MG TABS tablet TAKE 1 TABLET(5 MG) BY MOUTH TWICE DAILY 60 tablet 5   aspirin  EC 81 MG tablet Take 81 mg by mouth daily.     atorvastatin  (LIPITOR ) 80 MG tablet  Take 1 tablet (80 mg total) by mouth daily. 30 tablet 3   chlorhexidine  (PERIDEX ) 0.12 % solution Use as directed 5 mLs in the mouth or throat 4 (four) times daily. (Patient taking differently: Use as directed 5 mLs in the mouth or throat 2 (two) times daily.)     empagliflozin  (JARDIANCE ) 25 MG TABS tablet Take 25 mg by mouth daily.     ibuprofen (ADVIL) 800 MG tablet Take 800 mg by mouth as needed.     losartan  (COZAAR ) 100 MG  tablet Take 100 mg by mouth daily.     metoprolol  tartrate (LOPRESSOR ) 25 MG tablet Take 1 tablet (25 mg total) by mouth 2 (two) times daily. 60 tablet 3   Multiple Vitamin (MULTIVITAMIN WITH MINERALS) TABS tablet Take 1 tablet by mouth daily.     nitroGLYCERIN  (NITROSTAT ) 0.4 MG SL tablet Place 1 tablet (0.4 mg total) under the tongue every 5 (five) minutes x 3 doses as needed for chest pain. 25 tablet 1   pantoprazole  (PROTONIX ) 40 MG tablet Take 1 tablet (40 mg total) by mouth daily at 6 (six) AM. 30 tablet 3   potassium chloride  (KLOR-CON ) 10 MEQ tablet Take 10 mEq by mouth daily.     No current facility-administered medications for this encounter.    Physical Exam: BP (!) 142/72   Pulse 78   Ht 5' 4 (1.626 m)   Wt 73 kg   BMI 27.64 kg/m   GEN: Well nourished, well developed in no acute distress CARDIAC: Regular rate and rhythm, no murmurs, rubs, gallops RESPIRATORY:  Clear to auscultation without rales, wheezing or rhonchi  ABDOMEN: Soft, non-tender, non-distended EXTREMITIES:  No edema; No deformity    Wt Readings from Last 3 Encounters:  06/23/24 73 kg  05/15/24 73.4 kg  03/13/24 72.3 kg     EKG today demonstrates  V pacing with underlying atrial flutter with 4:1 block Vent. rate 78 BPM PR interval * ms QRS duration 164 ms QT/QTcB 452/515 ms   Echo 08/12/23 demonstrated   1. Left ventricular ejection fraction, by estimation, is 65 to 70%. The  left ventricle has normal function. The left ventricle has no regional  wall motion abnormalities. 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 Elevated left ventricular end-diastolic pressure.   2. Right ventricular systolic function is normal. The right ventricular  size is normal. There is normal pulmonary artery systolic pressure.   3. Left atrial size was mildly dilated.   4. The mitral valve is normal in structure. No evidence of mitral valve  regurgitation. Moderate mitral annular  calcification.   5. The aortic valve is calcified. Aortic valve regurgitation is not  visualized. Mild to moderate aortic valve stenosis. Aortic valve area, by  VTI measures 1.38 cm. Aortic valve mean gradient measures 17.5 mmHg.    CHA2DS2-VASc Score = 8  The patient's score is based upon: CHF History: 0 HTN History: 1 Diabetes History: 1 Stroke History: 2 Vascular Disease History: 1 Age Score: 2 Gender Score: 1       ASSESSMENT AND PLAN: Persistent Atrial Fibrillation/atrial flutter (ICD10:  I48.19) The patient's CHA2DS2-VASc score is 8, indicating a 10.8% annual risk of stroke.   Ongoing afib episode since 04/21/24. We revisited rate vs rhythm control today. She would like to pursue a conservative rate control strategy.  Continue Eliquis  5 mg BID Continue Lopressor  25 mg BID  Secondary Hypercoagulable State (ICD10:  D68.69) The patient is at significant risk for  stroke/thromboembolism based upon her CHA2DS2-VASc Score of 8.  Continue Apixaban  (Eliquis ). No bleeding issues.   CHB S/p PPM, followed by Dr Cindie  CAD No anginal symptoms Followed by Dr Jeffrie  HTN Stable on current regimen   Follow up with EP APP and Dr Jeffrie per recalls. AF clinic as needed.         Daril Kicks PA-C Afib Clinic [facility] [address] [place], [place] [address] [phone]

## 2024-08-04 ENCOUNTER — Ambulatory Visit (INDEPENDENT_AMBULATORY_CARE_PROVIDER_SITE_OTHER): Payer: Medicare Other

## 2024-08-04 DIAGNOSIS — I442 Atrioventricular block, complete: Secondary | ICD-10-CM

## 2024-08-05 LAB — CUP PACEART REMOTE DEVICE CHECK
Battery Remaining Longevity: 91 mo
Battery Remaining Percentage: 72 %
Battery Voltage: 3.01 V
Brady Statistic AP VP Percent: 18 %
Brady Statistic AP VS Percent: 1 %
Brady Statistic AS VP Percent: 81 %
Brady Statistic AS VS Percent: 1 %
Brady Statistic RA Percent Paced: 6.8 %
Brady Statistic RV Percent Paced: 78 %
Date Time Interrogation Session: 20250908053203
Implantable Lead Connection Status: 753985
Implantable Lead Connection Status: 753985
Implantable Lead Implant Date: 20220912
Implantable Lead Implant Date: 20220912
Implantable Lead Location: 753859
Implantable Lead Location: 753860
Implantable Pulse Generator Implant Date: 20220912
Lead Channel Impedance Value: 360 Ohm
Lead Channel Impedance Value: 510 Ohm
Lead Channel Pacing Threshold Amplitude: 0.625 V
Lead Channel Pacing Threshold Amplitude: 0.625 V
Lead Channel Pacing Threshold Pulse Width: 0.5 ms
Lead Channel Pacing Threshold Pulse Width: 0.5 ms
Lead Channel Sensing Intrinsic Amplitude: 12 mV
Lead Channel Sensing Intrinsic Amplitude: 3.1 mV
Lead Channel Setting Pacing Amplitude: 0.875
Lead Channel Setting Pacing Amplitude: 1.625
Lead Channel Setting Pacing Pulse Width: 0.5 ms
Lead Channel Setting Sensing Sensitivity: 4 mV
Pulse Gen Model: 2272
Pulse Gen Serial Number: 3956661

## 2024-08-10 ENCOUNTER — Ambulatory Visit: Payer: Self-pay | Admitting: Cardiology

## 2024-08-14 NOTE — Progress Notes (Signed)
 Remote PPM Transmission

## 2024-09-04 ENCOUNTER — Other Ambulatory Visit: Payer: Self-pay | Admitting: Physician Assistant

## 2024-09-08 ENCOUNTER — Ambulatory Visit: Admitting: Adult Health

## 2024-09-12 ENCOUNTER — Ambulatory Visit: Admitting: Adult Health

## 2024-09-23 ENCOUNTER — Ambulatory Visit: Admitting: Adult Health

## 2024-09-25 ENCOUNTER — Telehealth: Payer: Self-pay | Admitting: *Deleted

## 2024-09-25 ENCOUNTER — Ambulatory Visit: Admitting: Adult Health

## 2024-09-25 NOTE — Telephone Encounter (Signed)
 Provider caught in traffic this morning. I called pt's daughter about appt to let her know and offer  to either keep appt if she feels pt needs to be seen today or we can r/s to one morning next week. Daughter said next week would work better as pt fell this AM. Pt told her no injuries and did not hit head but she hit her shoulder. I told daughter to take patient to urgent care or ER if she seems to be injured. Daughter thanked me for the call and asked for the 830 AM appt next Wed 11/5 (prefers early AM). Arrive at 815.

## 2024-10-01 ENCOUNTER — Ambulatory Visit: Admitting: Adult Health

## 2024-10-06 ENCOUNTER — Other Ambulatory Visit: Payer: Self-pay | Admitting: Physician Assistant

## 2024-11-03 ENCOUNTER — Ambulatory Visit: Payer: Medicare Other

## 2024-11-04 ENCOUNTER — Encounter (HOSPITAL_COMMUNITY): Payer: Self-pay | Admitting: Internal Medicine

## 2024-11-04 ENCOUNTER — Emergency Department (HOSPITAL_COMMUNITY)

## 2024-11-04 ENCOUNTER — Observation Stay (HOSPITAL_COMMUNITY)
Admission: EM | Admit: 2024-11-04 | Discharge: 2024-11-13 | DRG: 375 | Disposition: A | Attending: Family Medicine | Admitting: Family Medicine

## 2024-11-04 DIAGNOSIS — E1169 Type 2 diabetes mellitus with other specified complication: Secondary | ICD-10-CM | POA: Diagnosis present

## 2024-11-04 DIAGNOSIS — Z833 Family history of diabetes mellitus: Secondary | ICD-10-CM

## 2024-11-04 DIAGNOSIS — Z90710 Acquired absence of both cervix and uterus: Secondary | ICD-10-CM

## 2024-11-04 DIAGNOSIS — R109 Unspecified abdominal pain: Secondary | ICD-10-CM

## 2024-11-04 DIAGNOSIS — I4892 Unspecified atrial flutter: Secondary | ICD-10-CM | POA: Diagnosis present

## 2024-11-04 DIAGNOSIS — E1122 Type 2 diabetes mellitus with diabetic chronic kidney disease: Secondary | ICD-10-CM | POA: Diagnosis present

## 2024-11-04 DIAGNOSIS — K828 Other specified diseases of gallbladder: Secondary | ICD-10-CM | POA: Diagnosis present

## 2024-11-04 DIAGNOSIS — Z7982 Long term (current) use of aspirin: Secondary | ICD-10-CM

## 2024-11-04 DIAGNOSIS — I2510 Atherosclerotic heart disease of native coronary artery without angina pectoris: Secondary | ICD-10-CM | POA: Diagnosis present

## 2024-11-04 DIAGNOSIS — K219 Gastro-esophageal reflux disease without esophagitis: Secondary | ICD-10-CM | POA: Diagnosis present

## 2024-11-04 DIAGNOSIS — Z823 Family history of stroke: Secondary | ICD-10-CM

## 2024-11-04 DIAGNOSIS — N1831 Chronic kidney disease, stage 3a: Secondary | ICD-10-CM | POA: Diagnosis present

## 2024-11-04 DIAGNOSIS — Z888 Allergy status to other drugs, medicaments and biological substances status: Secondary | ICD-10-CM

## 2024-11-04 DIAGNOSIS — Z950 Presence of cardiac pacemaker: Secondary | ICD-10-CM | POA: Diagnosis present

## 2024-11-04 DIAGNOSIS — E1165 Type 2 diabetes mellitus with hyperglycemia: Secondary | ICD-10-CM | POA: Diagnosis present

## 2024-11-04 DIAGNOSIS — Z79899 Other long term (current) drug therapy: Secondary | ICD-10-CM

## 2024-11-04 DIAGNOSIS — C19 Malignant neoplasm of rectosigmoid junction: Principal | ICD-10-CM | POA: Diagnosis present

## 2024-11-04 DIAGNOSIS — C787 Secondary malignant neoplasm of liver and intrahepatic bile duct: Secondary | ICD-10-CM | POA: Diagnosis present

## 2024-11-04 DIAGNOSIS — E119 Type 2 diabetes mellitus without complications: Secondary | ICD-10-CM

## 2024-11-04 DIAGNOSIS — R413 Other amnesia: Secondary | ICD-10-CM | POA: Diagnosis present

## 2024-11-04 DIAGNOSIS — I480 Paroxysmal atrial fibrillation: Secondary | ICD-10-CM | POA: Diagnosis present

## 2024-11-04 DIAGNOSIS — R112 Nausea with vomiting, unspecified: Secondary | ICD-10-CM

## 2024-11-04 DIAGNOSIS — R7989 Other specified abnormal findings of blood chemistry: Secondary | ICD-10-CM | POA: Insufficient documentation

## 2024-11-04 DIAGNOSIS — I130 Hypertensive heart and chronic kidney disease with heart failure and stage 1 through stage 4 chronic kidney disease, or unspecified chronic kidney disease: Secondary | ICD-10-CM | POA: Diagnosis present

## 2024-11-04 DIAGNOSIS — E1142 Type 2 diabetes mellitus with diabetic polyneuropathy: Secondary | ICD-10-CM | POA: Diagnosis present

## 2024-11-04 DIAGNOSIS — I739 Peripheral vascular disease, unspecified: Secondary | ICD-10-CM | POA: Diagnosis present

## 2024-11-04 DIAGNOSIS — Z515 Encounter for palliative care: Secondary | ICD-10-CM

## 2024-11-04 DIAGNOSIS — Z66 Do not resuscitate: Secondary | ICD-10-CM | POA: Diagnosis present

## 2024-11-04 DIAGNOSIS — Z7984 Long term (current) use of oral hypoglycemic drugs: Secondary | ICD-10-CM

## 2024-11-04 DIAGNOSIS — I5042 Chronic combined systolic (congestive) and diastolic (congestive) heart failure: Secondary | ICD-10-CM | POA: Diagnosis present

## 2024-11-04 DIAGNOSIS — R911 Solitary pulmonary nodule: Secondary | ICD-10-CM | POA: Diagnosis present

## 2024-11-04 DIAGNOSIS — I252 Old myocardial infarction: Secondary | ICD-10-CM

## 2024-11-04 DIAGNOSIS — E1151 Type 2 diabetes mellitus with diabetic peripheral angiopathy without gangrene: Secondary | ICD-10-CM | POA: Diagnosis present

## 2024-11-04 DIAGNOSIS — F0390 Unspecified dementia without behavioral disturbance: Secondary | ICD-10-CM | POA: Diagnosis present

## 2024-11-04 DIAGNOSIS — Z7901 Long term (current) use of anticoagulants: Secondary | ICD-10-CM

## 2024-11-04 DIAGNOSIS — I951 Orthostatic hypotension: Secondary | ICD-10-CM | POA: Diagnosis present

## 2024-11-04 DIAGNOSIS — E11319 Type 2 diabetes mellitus with unspecified diabetic retinopathy without macular edema: Secondary | ICD-10-CM | POA: Diagnosis present

## 2024-11-04 DIAGNOSIS — R160 Hepatomegaly, not elsewhere classified: Principal | ICD-10-CM | POA: Diagnosis present

## 2024-11-04 DIAGNOSIS — I482 Chronic atrial fibrillation, unspecified: Secondary | ICD-10-CM | POA: Diagnosis present

## 2024-11-04 DIAGNOSIS — E8809 Other disorders of plasma-protein metabolism, not elsewhere classified: Secondary | ICD-10-CM | POA: Diagnosis present

## 2024-11-04 DIAGNOSIS — I442 Atrioventricular block, complete: Secondary | ICD-10-CM | POA: Diagnosis present

## 2024-11-04 DIAGNOSIS — N3000 Acute cystitis without hematuria: Secondary | ICD-10-CM

## 2024-11-04 DIAGNOSIS — D509 Iron deficiency anemia, unspecified: Secondary | ICD-10-CM | POA: Diagnosis present

## 2024-11-04 DIAGNOSIS — E1159 Type 2 diabetes mellitus with other circulatory complications: Secondary | ICD-10-CM | POA: Diagnosis present

## 2024-11-04 DIAGNOSIS — E7849 Other hyperlipidemia: Secondary | ICD-10-CM | POA: Diagnosis present

## 2024-11-04 LAB — CUP PACEART REMOTE DEVICE CHECK
Battery Remaining Longevity: 85 mo
Battery Remaining Percentage: 70 %
Battery Voltage: 2.99 V
Brady Statistic AP VP Percent: 18 %
Brady Statistic AP VS Percent: 1 %
Brady Statistic AS VP Percent: 81 %
Brady Statistic AS VS Percent: 1 %
Brady Statistic RA Percent Paced: 4.5 %
Brady Statistic RV Percent Paced: 86 %
Date Time Interrogation Session: 20251208020012
Implantable Lead Connection Status: 753985
Implantable Lead Connection Status: 753985
Implantable Lead Implant Date: 20220912
Implantable Lead Implant Date: 20220912
Implantable Lead Location: 753859
Implantable Lead Location: 753860
Implantable Pulse Generator Implant Date: 20220912
Lead Channel Impedance Value: 360 Ohm
Lead Channel Impedance Value: 510 Ohm
Lead Channel Pacing Threshold Amplitude: 0.625 V
Lead Channel Pacing Threshold Amplitude: 0.875 V
Lead Channel Pacing Threshold Pulse Width: 0.5 ms
Lead Channel Pacing Threshold Pulse Width: 0.5 ms
Lead Channel Sensing Intrinsic Amplitude: 12 mV
Lead Channel Sensing Intrinsic Amplitude: 3.1 mV
Lead Channel Setting Pacing Amplitude: 1.125
Lead Channel Setting Pacing Amplitude: 1.625
Lead Channel Setting Pacing Pulse Width: 0.5 ms
Lead Channel Setting Sensing Sensitivity: 4 mV
Pulse Gen Model: 2272
Pulse Gen Serial Number: 3956661

## 2024-11-04 LAB — COMPREHENSIVE METABOLIC PANEL WITH GFR
ALT: 39 U/L (ref 0–44)
AST: 63 U/L — ABNORMAL HIGH (ref 15–41)
Albumin: 2.9 g/dL — ABNORMAL LOW (ref 3.5–5.0)
Alkaline Phosphatase: 467 U/L — ABNORMAL HIGH (ref 38–126)
Anion gap: 12 (ref 5–15)
BUN: 19 mg/dL (ref 8–23)
CO2: 25 mmol/L (ref 22–32)
Calcium: 7.9 mg/dL — ABNORMAL LOW (ref 8.9–10.3)
Chloride: 102 mmol/L (ref 98–111)
Creatinine, Ser: 1.17 mg/dL — ABNORMAL HIGH (ref 0.44–1.00)
GFR, Estimated: 46 mL/min — ABNORMAL LOW (ref >60)
Glucose, Bld: 286 mg/dL — ABNORMAL HIGH (ref 70–99)
Potassium: 4 mmol/L (ref 3.5–5.1)
Sodium: 139 mmol/L (ref 135–145)
Total Bilirubin: 2.1 mg/dL — ABNORMAL HIGH (ref 0.0–1.2)
Total Protein: 5.6 g/dL — ABNORMAL LOW (ref 6.5–8.1)

## 2024-11-04 LAB — URINALYSIS, ROUTINE W REFLEX MICROSCOPIC
Bilirubin Urine: NEGATIVE
Glucose, UA: >=500 mg/dL — AB
Ketones, ur: 5 mg/dL — AB
Leukocytes,Ua: NEGATIVE
Nitrite: NEGATIVE
Protein, ur: NEGATIVE mg/dL
Specific Gravity, Urine: 1.025 (ref 1.005–1.030)
pH: 5 (ref 5.0–8.0)

## 2024-11-04 LAB — CBC
HCT: 30 % — ABNORMAL LOW (ref 36.0–46.0)
Hemoglobin: 8.9 g/dL — ABNORMAL LOW (ref 12.0–15.0)
MCH: 25.6 pg — ABNORMAL LOW (ref 26.0–34.0)
MCHC: 29.7 g/dL — ABNORMAL LOW (ref 30.0–36.0)
MCV: 86.2 fL (ref 80.0–100.0)
Platelets: 302 K/uL (ref 150–400)
RBC: 3.48 MIL/uL — ABNORMAL LOW (ref 3.87–5.11)
RDW: 19.3 % — ABNORMAL HIGH (ref 11.5–15.5)
WBC: 8.4 K/uL (ref 4.0–10.5)
nRBC: 0.2 % (ref 0.0–0.2)

## 2024-11-04 LAB — LIPASE, BLOOD: Lipase: 35 U/L (ref 11–51)

## 2024-11-04 LAB — CBG MONITORING, ED: Glucose-Capillary: 281 mg/dL — ABNORMAL HIGH (ref 70–99)

## 2024-11-04 MED ORDER — LACTATED RINGERS IV BOLUS
500.0000 mL | Freq: Once | INTRAVENOUS | Status: AC
  Administered 2024-11-04: 500 mL via INTRAVENOUS

## 2024-11-04 MED ORDER — METOPROLOL TARTRATE 25 MG PO TABS
25.0000 mg | ORAL_TABLET | Freq: Two times a day (BID) | ORAL | Status: DC
  Administered 2024-11-05 – 2024-11-13 (×18): 25 mg via ORAL
  Filled 2024-11-04 (×19): qty 1

## 2024-11-04 MED ORDER — IOHEXOL 350 MG/ML SOLN
75.0000 mL | Freq: Once | INTRAVENOUS | Status: AC | PRN
  Administered 2024-11-04: 75 mL via INTRAVENOUS

## 2024-11-04 MED ORDER — SODIUM CHLORIDE 0.9 % IV SOLN: 1.0000 g | Freq: Once | INTRAVENOUS | Status: DC

## 2024-11-04 MED ORDER — PANTOPRAZOLE SODIUM 40 MG PO TBEC
40.0000 mg | DELAYED_RELEASE_TABLET | Freq: Every day | ORAL | Status: DC
  Administered 2024-11-05 – 2024-11-13 (×8): 40 mg via ORAL
  Filled 2024-11-04 (×9): qty 1

## 2024-11-04 MED ORDER — LIDOCAINE VISCOUS HCL 2 % MT SOLN
15.0000 mL | Freq: Once | OROMUCOSAL | Status: AC
  Administered 2024-11-04: 15 mL via ORAL
  Filled 2024-11-04: qty 15

## 2024-11-04 MED ORDER — SODIUM CHLORIDE 0.9 % IV SOLN
2.0000 g | Freq: Once | INTRAVENOUS | Status: AC
  Administered 2024-11-04: 2 g via INTRAVENOUS
  Filled 2024-11-04: qty 20

## 2024-11-04 MED ORDER — ALUM & MAG HYDROXIDE-SIMETH 200-200-20 MG/5ML PO SUSP
30.0000 mL | Freq: Once | ORAL | Status: AC
  Administered 2024-11-04: 30 mL via ORAL
  Filled 2024-11-04: qty 30

## 2024-11-04 NOTE — ED Provider Notes (Signed)
 Amanda Stewart EMERGENCY DEPARTMENT AT [facility] Provider Note   CSN: [id number] Arrival date & time: 11/04/24  1528     Patient presents with: No chief complaint on file.   Amanda Stewart is a 83 y.o. female.   83 year old female presents for evaluation of not feeling well.  She has had some nausea and vomiting and felt very dizzy.  States has been feeling weak all day.  Denies any other symptoms or concerns.        Prior to Admission medications   Medication Sig Start Date End Date Taking? Authorizing Provider  acetaminophen  (TYLENOL ) 325 MG tablet Take 1-2 tablets (325-650 mg total) by mouth every 4 (four) hours as needed for mild pain. Patient taking differently: Take 325-650 mg by mouth as needed for mild pain (pain score 1-3). 08/10/21   Claudene Pacific, MD  albuterol  (VENTOLIN  HFA) 108 (90 Base) MCG/ACT inhaler Inhale 2 puffs into the lungs every 6 (six) hours as needed for wheezing or shortness of breath.    [provider]  amLODipine  (NORVASC ) 5 MG tablet Take 5 mg by mouth daily. 09/25/23   [provider]  aspirin  EC 81 MG tablet Take 81 mg by mouth daily.    [provider]  atorvastatin  (LIPITOR ) 80 MG tablet Take 1 tablet (80 mg total) by mouth daily. 09/07/23   Samtani, Jai-Gurmukh, MD  chlorhexidine  (PERIDEX ) 0.12 % solution Use as directed 5 mLs in the mouth or throat 4 (four) times daily. Patient taking differently: Use as directed 5 mLs in the mouth or throat 2 (two) times daily. 10/29/23   [provider]  ELIQUIS  5 MG TABS tablet TAKE 1 TABLET(5 MG) BY MOUTH TWICE DAILY 10/06/24   Fenton, Clint R, PA  empagliflozin  (JARDIANCE ) 25 MG TABS tablet Take 25 mg by mouth daily.    [provider]  ibuprofen (ADVIL) 800 MG tablet Take 800 mg by mouth as needed. 11/09/23   [provider]  losartan  (COZAAR ) 100 MG tablet Take 100 mg by mouth daily. 10/01/23   [provider]  metoprolol  tartrate (LOPRESSOR )  25 MG tablet Take 1 tablet (25 mg total) by mouth 2 (two) times daily. 08/13/23   Patsy Lenis, MD  Multiple Vitamin (MULTIVITAMIN WITH MINERALS) TABS tablet Take 1 tablet by mouth daily.    [provider]  nitroGLYCERIN  (NITROSTAT ) 0.4 MG SL tablet Place 1 tablet (0.4 mg total) under the tongue every 5 (five) minutes x 3 doses as needed for chest pain. 12/12/14   Claudene Pacific, MD  pantoprazole  (PROTONIX ) 40 MG tablet Take 1 tablet (40 mg total) by mouth daily at 6 (six) AM. 08/11/21   Claudene Pacific, MD  potassium chloride  (KLOR-CON ) 10 MEQ tablet Take 10 mEq by mouth daily. 10/02/23   [provider]    Allergies: Metformin  and related    Review of Systems  Constitutional:  Positive for fatigue. Negative for chills and fever.  HENT:  Negative for ear pain and sore throat.   Eyes:  Negative for pain and visual disturbance.  Respiratory:  Negative for cough and shortness of breath.   Cardiovascular:  Negative for chest pain and palpitations.  Gastrointestinal:  Positive for nausea and vomiting. Negative for abdominal pain.  Genitourinary:  Negative for dysuria and hematuria.  Musculoskeletal:  Negative for arthralgias and back pain.  Skin:  Negative for color change and rash.  Neurological:  Negative for seizures and syncope.  All other systems reviewed and are negative.  Updated Vital Signs BP (!) 145/70   Pulse 70   Temp 98.2 F (36.8 C) (Oral)   Resp 16   SpO2 100%   Physical Exam Vitals and nursing note reviewed.  Constitutional:      General: She is not in acute distress.    Appearance: Normal appearance. She is well-developed. She is ill-appearing.  HENT:     Head: Normocephalic and atraumatic.  Eyes:     Conjunctiva/sclera: Conjunctivae normal.  Cardiovascular:     Rate and Rhythm: Normal rate and regular rhythm.     Heart sounds: No murmur heard. Pulmonary:     Effort: Pulmonary effort is normal. No respiratory distress.     Breath sounds: Normal  breath sounds.  Abdominal:     Palpations: Abdomen is soft.     Tenderness: There is no abdominal tenderness.  Musculoskeletal:        General: No swelling.     Cervical back: Neck supple.  Skin:    General: Skin is warm and dry.     Capillary Refill: Capillary refill takes less than 2 seconds.  Neurological:     Mental Status: She is alert.  Psychiatric:        Mood and Affect: Mood normal.     (all labs ordered are listed, but only abnormal results are displayed) Labs Reviewed  COMPREHENSIVE METABOLIC PANEL WITH GFR - Abnormal; Notable for the following components:      Result Value   Glucose, Bld 286 (*)    Creatinine, Ser 1.17 (*)    Calcium  7.9 (*)    Total Protein 5.6 (*)    Albumin 2.9 (*)    AST 63 (*)    Alkaline Phosphatase 467 (*)    Total Bilirubin 2.1 (*)    GFR, Estimated 46 (*)    All other components within normal limits  CBC - Abnormal; Notable for the following components:   RBC 3.48 (*)    Hemoglobin 8.9 (*)    HCT 30.0 (*)    MCH 25.6 (*)    MCHC 29.7 (*)    RDW 19.3 (*)    All other components within normal limits  URINALYSIS, ROUTINE W REFLEX MICROSCOPIC - Abnormal; Notable for the following components:   APPearance HAZY (*)    Glucose, UA >=500 (*)    Hgb urine dipstick SMALL (*)    Ketones, ur 5 (*)    Bacteria, UA MANY (*)    All other components within normal limits  CBG MONITORING, ED - Abnormal; Notable for the following components:   Glucose-Capillary 281 (*)    All other components within normal limits  LIPASE, BLOOD    EKG: None  Radiology: CT ABDOMEN PELVIS W CONTRAST Result Date: 11/04/2024 EXAM: CT ABDOMEN AND PELVIS WITH CONTRAST 11/04/2024 08:23:52 PM TECHNIQUE: CT of the abdomen and pelvis was performed with the administration of intravenous contrast (75 mL iohexol  (OMNIPAQUE ) 350 MG/ML injection). Multiplanar reformatted images are provided for review. Automated exposure control, iterative reconstruction, and/or  weight-based adjustment of the mA/kV was utilized to reduce the radiation dose to as low as reasonably achievable. COMPARISON: None available. CLINICAL HISTORY: abd pain, elevated bilirubin, vomiting FINDINGS: LOWER CHEST: Aortic valve leaflet calcification. Partially visualized cardiac implantable electronic device (defibrillator). LIVER: Multiple lobulated fluid density lesions within the liver that appears slightly heterogeneous centrally with some vascularity running through the lesions. GALLBLADDER AND BILE DUCTS: Gallbladder is unremarkable. No biliary ductal dilatation. SPLEEN: Multiple subcentimeter splenules noted. The spleen is  unremarkable. PANCREAS: No acute abnormality. ADRENAL GLANDS: Bilateral adrenal hyperplasia with no definite nodularity. KIDNEYS, URETERS AND BLADDER: No stones in the kidneys or ureters. No hydronephrosis. No perinephric or periureteral stranding. Urinary bladder is unremarkable. GI AND BOWEL: Small hiatal hernia. No small or large bowel thickening or dilatation. The appendix is unremarkable. PERITONEUM AND RETROPERITONEUM: No ascites. No free air. VASCULATURE: Severe atherosclerotic plaque of the aorta. LYMPH NODES: No lymphadenopathy. REPRODUCTIVE ORGANS: No acute abnormality. BONES AND SOFT TISSUES: Multilevel degenerative changes of the spine. No acute osseous abnormality. No focal soft tissue abnormality. IMPRESSION: 1. No acute findings in the abdomen or pelvis. 2. Multiple lobulated fluid-density hepatic lesions, slightly heterogeneous centrally with internal vascularity; recommend dedicated liver MRI with contrast for characterization. 3. Other, non-acute and/or normal findings as above. Electronically signed by: Kate Plummer MD 11/04/2024 08:34 PM EST RP Workstation: [id number]   US  Abdomen Limited Result Date: 11/04/2024 EXAM: Right Upper Quadrant Abdominal Ultrasound 11/04/2024 07:37:00 PM TECHNIQUE: Real-time ultrasonography of the right upper quadrant of the  abdomen was performed. COMPARISON: None available. CLINICAL HISTORY: RUQ US  RUQ US  FINDINGS: LIVER: The liver demonstrates normal echogenicity. No intrahepatic biliary ductal dilatation. Multiple hypoechoic lesions noted in the liver, particularly the left hepatic lobe measuring up to 2.4 cm. These appear to reflect solid mass lesions. BILIARY SYSTEM: No pericholecystic fluid or wall thickening. No cholelithiasis. Common bile duct is within normal limits measuring 4 mm. OTHER: No right upper quadrant ascites. IMPRESSION: 1. Multiple hypoechoic solid liver masses, up to 2.4 cm, suspicious for hepatic neoplasm; recommend dedicated multiphasic liver MRI or contrast-enhanced CT for further characterization. Electronically signed by: Franky Crease MD 11/04/2024 07:40 PM EST RP Workstation: [id number]   DG Chest 1 View Result Date: 11/04/2024 EXAM: 1 VIEW(S) XRAY OF THE CHEST 11/04/2024 06:13:00 PM COMPARISON: 11/10/2023. CLINICAL HISTORY: cough FINDINGS: LUNGS AND PLEURA: No focal pulmonary opacity. No pleural effusion. No pneumothorax. HEART AND MEDIASTINUM: Left pacer remains in place, unchanged. BONES AND SOFT TISSUES: No acute osseous abnormality. IMPRESSION: 1. No acute cardiopulmonary process. 2. Left pacer in place without interval change. Electronically signed by: Franky Crease MD 11/04/2024 06:34 PM EST RP Workstation: [id number]   CUP PACEART REMOTE DEVICE CHECK Result Date: 11/04/2024 PPM Scheduled remote reviewed. Normal device function.  Presenting rhythm:  AF/AFL/VP Known AF, controlled rates, Eliquis  per PA report Next remote transmission per protocol. LA, CVRS    Procedures   Medications Ordered in the ED  cefTRIAXone  (ROCEPHIN ) 2 g in sodium chloride  0.9 % 100 mL IVPB (has no administration in time range)  lactated ringers  bolus 500 mL (0 mLs Intravenous Stopped 11/04/24 1928)  iohexol  (OMNIPAQUE ) 350 MG/ML injection 75 mL (75 mLs Intravenous Contrast Given 11/04/24 2019)                                     Medical Decision Making Cardiac monitor interpretation: Sinus rhythm, no ectopy  Social determinants of health: Patient with history of dementia, poor historian  Patient here for nausea and vomiting.  Was given fluids and Zofran  and feeling much better.  Found to have an elevation in in her T. bili, alk phos and AST.  CT abdomen pelvis and right upper quadrant ultrasound show evidence of likely liver masses that are vascular.  Also found to have a UTI and started on Rocephin .  She is feeling much better and I discussed all results with patient and family at bedside.  Discussed patient's case with Dr. Franky and he recommended ordering MRI that was recommended by radiology on CT and he will evaluate the patient admit her for further workup and management.    Problems Addressed: Acute cystitis without hematuria: acute illness or injury Liver masses: undiagnosed new problem with uncertain prognosis Nausea and vomiting, unspecified vomiting type: acute illness or injury  Amount and/or Complexity of Data Reviewed External Data Reviewed: notes.    Details: Prior hospital records reviewed and patient was admitted 11-10-2023 for AKI Labs: ordered. Decision-making details documented in ED Course.    Details: Ordered and reviewed by me and patient does have elevation in alk phos, T. bili and AST except for UTI Radiology: ordered and independent interpretation performed. Decision-making details documented in ED Course.    Details: Ordered and interpreted by me independently of radiology  CT abdomen pelvis and right upper quadrant ultrasound both show evidence of vascular liver lesions  Discussion of management or test interpretation with external provider(s): Dr. Franky -hospitalist-he would admit the patient to his service for further workup and management  Risk OTC drugs. Prescription drug management. Drug therapy requiring intensive monitoring for toxicity. Decision  regarding hospitalization. Diagnosis or treatment significantly limited by social determinants of health.     Final diagnoses:  Liver masses  Nausea and vomiting, unspecified vomiting type  Acute cystitis without hematuria    ED Discharge Orders     None          Gennaro Duwaine CROME, DO 11/04/24 2203

## 2024-11-04 NOTE — H&P (Signed)
 History and Physical    Amanda Stewart FMW:[id number] DOB: June 07, 1941 DOA: 11/04/2024  Patient coming from: ***  Chief Complaint: ***  HPI: Amanda Stewart is a 83 y.o. female with ***   ED Course: ***  Review of Systems: As per HPI, rest all negative.   Past Medical History:  Diagnosis Date   Allergic rhinitis    Anemia    Arrhythmia    CAD (coronary artery disease)    Chest pain    Chronic kidney disease, stage 3a (HCC) 08/11/2023   Combined systolic and diastolic heart failure (HCC)    Diabetes mellitus without complication (HCC)    Diabetic peripheral neuropathy (HCC)    GERD (gastroesophageal reflux disease)    Heart block    Hyperlipidemia    Hypertension    Left bundle branch block    Low back pain    Myocardial infarction (HCC)    Peripheral arterial disease    Retinopathy     Past Surgical History:  Procedure Laterality Date   ABDOMINAL HYSTERECTOMY     LEFT AND RIGHT HEART CATHETERIZATION WITH CORONARY ANGIOGRAM N/A 05/26/2014   Procedure: LEFT AND RIGHT HEART CATHETERIZATION WITH CORONARY ANGIOGRAM;  Surgeon: Salena GORMAN Negri, MD;  Location: [facility] CATH LAB;  Service: Cardiovascular;  Laterality: N/A;   PACEMAKER IMPLANT N/A 08/08/2021   Procedure: PACEMAKER IMPLANT;  Surgeon: Cindie Ole DASEN, MD;  Location: [facility] INVASIVE CV LAB;  Service: Cardiovascular;  Laterality: N/A;   TEMPORARY PACEMAKER N/A 08/07/2021   Procedure: TEMPORARY PACEMAKER;  Surgeon: Levern Hutching, MD;  Location: [facility] INVASIVE CV LAB;  Service: Cardiovascular;  Laterality: N/A;   TRANSCAROTID ARTERY REVASCULARIZATION  Right 09/04/2023   Procedure: Transcarotid Artery Revascularization;  Surgeon: Pearline Norman GORMAN, MD;  Location: [facility] OR;  Service: Vascular;  Laterality: Right;   ULTRASOUND GUIDANCE FOR VASCULAR ACCESS Left 09/04/2023   Procedure: ULTRASOUND GUIDANCE FOR VASCULAR ACCESS;  Surgeon: Pearline Norman GORMAN, MD;  Location: [facility] OR;  Service: Vascular;  Laterality: Left;     reports that she has never  smoked. She has never used smokeless tobacco. She reports current alcohol use of about 1.0 standard drink of alcohol per week. She reports that she does not use drugs.  Allergies  Allergen Reactions   Metformin  And Related Other (See Comments)    Diarrhea     Family History  Problem Relation Age of Onset   Diabetes Mother    Diabetes Father    Stroke Father     Prior to Admission medications   Medication Sig Start Date End Date Taking? Authorizing Provider  acetaminophen  (TYLENOL ) 325 MG tablet Take 1-2 tablets (325-650 mg total) by mouth every 4 (four) hours as needed for mild pain. Patient taking differently: Take 325-650 mg by mouth as needed for mild pain (pain score 1-3). 08/10/21   Negri Salena, MD  albuterol  (VENTOLIN  HFA) 108 (90 Base) MCG/ACT inhaler Inhale 2 puffs into the lungs every 6 (six) hours as needed for wheezing or shortness of breath.    [provider]  amLODipine  (NORVASC ) 5 MG tablet Take 5 mg by mouth daily. 09/25/23   [provider]  aspirin  EC 81 MG tablet Take 81 mg by mouth daily.    [provider]  atorvastatin  (LIPITOR ) 80 MG tablet Take 1 tablet (80 mg total) by mouth daily. 09/07/23   Samtani, Jai-Gurmukh, MD  chlorhexidine  (PERIDEX ) 0.12 % solution Use as directed 5 mLs in the mouth or throat 4 (four) times daily. Patient taking  differently: Use as directed 5 mLs in the mouth or throat 2 (two) times daily. 10/29/23   [provider]  ELIQUIS  5 MG TABS tablet TAKE 1 TABLET(5 MG) BY MOUTH TWICE DAILY 10/06/24   Fenton, Clint R, PA  empagliflozin  (JARDIANCE ) 25 MG TABS tablet Take 25 mg by mouth daily.    [provider]  ibuprofen (ADVIL) 800 MG tablet Take 800 mg by mouth as needed. 11/09/23   [provider]  losartan  (COZAAR ) 100 MG tablet Take 100 mg by mouth daily. 10/01/23   [provider]  metoprolol  tartrate (LOPRESSOR ) 25 MG tablet Take 1 tablet (25 mg total) by mouth 2 (two) times  daily. 08/13/23   Patsy Lenis, MD  Multiple Vitamin (MULTIVITAMIN WITH MINERALS) TABS tablet Take 1 tablet by mouth daily.    [provider]  nitroGLYCERIN  (NITROSTAT ) 0.4 MG SL tablet Place 1 tablet (0.4 mg total) under the tongue every 5 (five) minutes x 3 doses as needed for chest pain. 12/12/14   Claudene Pacific, MD  pantoprazole  (PROTONIX ) 40 MG tablet Take 1 tablet (40 mg total) by mouth daily at 6 (six) AM. 08/11/21   Claudene Pacific, MD  potassium chloride  (KLOR-CON ) 10 MEQ tablet Take 10 mEq by mouth daily. 10/02/23   [provider]    Physical Exam: Constitutional: *** Vitals:   11/04/24 1928 11/04/24 2000 11/04/24 2050 11/04/24 2246  BP:  (!) 145/70    Pulse:  70    Resp:  16  16  Temp:   98.2 F (36.8 C) 98.1 F (36.7 C)  TempSrc:   Oral Oral  SpO2: 95% 100%     Eyes: *** ENMT: *** Neck: *** Respiratory: *** Cardiovascular: *** Abdomen: *** Musculoskeletal: *** Skin: *** Neurologic:*** Psychiatric: ***   Labs on Admission: I have personally reviewed following labs and imaging studies  CBC: Recent Labs  Lab 11/04/24 1542  WBC 8.4  HGB 8.9*  HCT 30.0*  MCV 86.2  PLT 302   Basic Metabolic Panel: Recent Labs  Lab 11/04/24 1542  NA 139  K 4.0  CL 102  CO2 25  GLUCOSE 286*  BUN 19  CREATININE 1.17*  CALCIUM  7.9*   GFR: CrCl cannot be calculated (Unknown ideal weight.). Liver Function Tests: Recent Labs  Lab 11/04/24 1542  AST 63*  ALT 39  ALKPHOS 467*  BILITOT 2.1*  PROT 5.6*  ALBUMIN 2.9*   Recent Labs  Lab 11/04/24 1542  LIPASE 35   No results for input(s): AMMONIA in the last 168 hours. Coagulation Profile: No results for input(s): INR, PROTIME in the last 168 hours. Cardiac Enzymes: No results for input(s): CKTOTAL, CKMB, CKMBINDEX, TROPONINI in the last 168 hours. BNP (last 3 results) No results for input(s): PROBNP in the last 8760 hours. HbA1C: No results for input(s): HGBA1C in the last  72 hours. CBG: Recent Labs  Lab 11/04/24 1549  GLUCAP 281*   Lipid Profile: No results for input(s): CHOL, HDL, LDLCALC, TRIG, CHOLHDL, LDLDIRECT in the last 72 hours. Thyroid Function Tests: No results for input(s): TSH, T4TOTAL, FREET4, T3FREE, THYROIDAB in the last 72 hours. Anemia Panel: No results for input(s): VITAMINB12, FOLATE, FERRITIN, TIBC, IRON, RETICCTPCT in the last 72 hours. Urine analysis:    Component Value Date/Time   COLORURINE YELLOW 11/04/2024 2040   APPEARANCEUR HAZY (A) 11/04/2024 2040   LABSPEC 1.025 11/04/2024 2040   PHURINE 5.0 11/04/2024 2040   GLUCOSEU >=500 (A) 11/04/2024 2040   HGBUR SMALL (A) 11/04/2024 2040  BILIRUBINUR NEGATIVE 11/04/2024 2040   KETONESUR 5 (A) 11/04/2024 2040   PROTEINUR NEGATIVE 11/04/2024 2040   NITRITE NEGATIVE 11/04/2024 2040   LEUKOCYTESUR NEGATIVE 11/04/2024 2040   Sepsis Labs: @LABRCNTIP (procalcitonin:4,lacticidven:4) )No results found for this or any previous visit (from the past 240 hours).   Radiological Exams on Admission: CT ABDOMEN PELVIS W CONTRAST Result Date: 11/04/2024 EXAM: CT ABDOMEN AND PELVIS WITH CONTRAST 11/04/2024 08:23:52 PM TECHNIQUE: CT of the abdomen and pelvis was performed with the administration of intravenous contrast (75 mL iohexol  (OMNIPAQUE ) 350 MG/ML injection). Multiplanar reformatted images are provided for review. Automated exposure control, iterative reconstruction, and/or weight-based adjustment of the mA/kV was utilized to reduce the radiation dose to as low as reasonably achievable. COMPARISON: None available. CLINICAL HISTORY: abd pain, elevated bilirubin, vomiting FINDINGS: LOWER CHEST: Aortic valve leaflet calcification. Partially visualized cardiac implantable electronic device (defibrillator). LIVER: Multiple lobulated fluid density lesions within the liver that appears slightly heterogeneous centrally with some vascularity running through the  lesions. GALLBLADDER AND BILE DUCTS: Gallbladder is unremarkable. No biliary ductal dilatation. SPLEEN: Multiple subcentimeter splenules noted. The spleen is unremarkable. PANCREAS: No acute abnormality. ADRENAL GLANDS: Bilateral adrenal hyperplasia with no definite nodularity. KIDNEYS, URETERS AND BLADDER: No stones in the kidneys or ureters. No hydronephrosis. No perinephric or periureteral stranding. Urinary bladder is unremarkable. GI AND BOWEL: Small hiatal hernia. No small or large bowel thickening or dilatation. The appendix is unremarkable. PERITONEUM AND RETROPERITONEUM: No ascites. No free air. VASCULATURE: Severe atherosclerotic plaque of the aorta. LYMPH NODES: No lymphadenopathy. REPRODUCTIVE ORGANS: No acute abnormality. BONES AND SOFT TISSUES: Multilevel degenerative changes of the spine. No acute osseous abnormality. No focal soft tissue abnormality. IMPRESSION: 1. No acute findings in the abdomen or pelvis. 2. Multiple lobulated fluid-density hepatic lesions, slightly heterogeneous centrally with internal vascularity; recommend dedicated liver MRI with contrast for characterization. 3. Other, non-acute and/or normal findings as above. Electronically signed by: Kate Plummer MD 11/04/2024 08:34 PM EST RP Workstation: [id number]   US  Abdomen Limited Result Date: 11/04/2024 EXAM: Right Upper Quadrant Abdominal Ultrasound 11/04/2024 07:37:00 PM TECHNIQUE: Real-time ultrasonography of the right upper quadrant of the abdomen was performed. COMPARISON: None available. CLINICAL HISTORY: RUQ US  RUQ US  FINDINGS: LIVER: The liver demonstrates normal echogenicity. No intrahepatic biliary ductal dilatation. Multiple hypoechoic lesions noted in the liver, particularly the left hepatic lobe measuring up to 2.4 cm. These appear to reflect solid mass lesions. BILIARY SYSTEM: No pericholecystic fluid or wall thickening. No cholelithiasis. Common bile duct is within normal limits measuring 4 mm. OTHER: No right  upper quadrant ascites. IMPRESSION: 1. Multiple hypoechoic solid liver masses, up to 2.4 cm, suspicious for hepatic neoplasm; recommend dedicated multiphasic liver MRI or contrast-enhanced CT for further characterization. Electronically signed by: Franky Crease MD 11/04/2024 07:40 PM EST RP Workstation: [id number]   DG Chest 1 View Result Date: 11/04/2024 EXAM: 1 VIEW(S) XRAY OF THE CHEST 11/04/2024 06:13:00 PM COMPARISON: 11/10/2023. CLINICAL HISTORY: cough FINDINGS: LUNGS AND PLEURA: No focal pulmonary opacity. No pleural effusion. No pneumothorax. HEART AND MEDIASTINUM: Left pacer remains in place, unchanged. BONES AND SOFT TISSUES: No acute osseous abnormality. IMPRESSION: 1. No acute cardiopulmonary process. 2. Left pacer in place without interval change. Electronically signed by: Franky Crease MD 11/04/2024 06:34 PM EST RP Workstation: [id number]   CUP PACEART REMOTE DEVICE CHECK Result Date: 11/04/2024 PPM Scheduled remote reviewed. Normal device function.  Presenting rhythm:  AF/AFL/VP Known AF, controlled rates, Eliquis  per PA report Next remote transmission per protocol. LA, CVRS   EKG:  Independently reviewed. ***  Assessment/Plan Principal Problem:   Abdominal pain Active Problems:   CAD (coronary artery disease)   Diabetes mellitus, type 2 (HCC)   Pacemaker   Chronic kidney disease, stage 3a (HCC)   Hypertension associated with diabetes (HCC)   Hyperlipidemia associated with type 2 diabetes mellitus (HCC)   PAD (peripheral artery disease)   Paroxysmal atrial fibrillation (HCC)   Liver mass   Elevated LFTs    ***   DVT prophylaxis: *** Code Status: ***  Family Communication: ***  Disposition Plan: ***  Consults called: ***  Admission status: ***

## 2024-11-04 NOTE — ED Notes (Signed)
 Ultrasound tech at bedside. Pt given call light and encouraged to call when she needs to void to provide urine sample.

## 2024-11-04 NOTE — ED Triage Notes (Signed)
 Pt bib ems abdominal pain for the last few days. Also with dizziness, nausea, clammy on scene. Hx dementia, hx pacemaker. Pt has not eaten today. CBG normal. Pt did take her medications today. Given 4mg  zofran  pta.. has been taking alka seltzer at home without relief of symptoms.  12 lead paced.  144/55

## 2024-11-04 NOTE — ED Notes (Signed)
 Provided pt sandwich bag

## 2024-11-04 NOTE — ED Provider Triage Note (Signed)
 Emergency Medicine Provider Triage Evaluation Note  Amanda Stewart , a 83 y.o. female  was evaluated in triage.  Pt complains of fatigue. States she's had some nausea and vomiting today and feels very fatigued.   Review of Systems  Positive: Nausea, fatigue, vomiting Negative: Dysuria, chest pain, sob   Physical Exam  BP 129/64 (BP Location: Right Arm)   Pulse 70   Temp 98.5 F (36.9 C) (Oral)   Resp 17   SpO2 96%  Gen:   Awake, no distress   Resp:  Normal effort  MSK:   Moves extremities without difficulty    Medical Decision Making  Medically screening exam initiated at 4:44 PM.  Appropriate orders placed.  Amanda Stewart was informed that the remainder of the evaluation will be completed by another provider, this initial triage assessment does not replace that evaluation, and the importance of remaining in the ED until their evaluation is complete.     Amanda Duwaine CROME, DO 11/04/24 1645

## 2024-11-05 ENCOUNTER — Observation Stay (HOSPITAL_COMMUNITY)

## 2024-11-05 ENCOUNTER — Other Ambulatory Visit: Payer: Self-pay

## 2024-11-05 DIAGNOSIS — R12 Heartburn: Secondary | ICD-10-CM | POA: Diagnosis not present

## 2024-11-05 DIAGNOSIS — I63231 Cerebral infarction due to unspecified occlusion or stenosis of right carotid arteries: Secondary | ICD-10-CM | POA: Diagnosis not present

## 2024-11-05 DIAGNOSIS — R7989 Other specified abnormal findings of blood chemistry: Secondary | ICD-10-CM | POA: Diagnosis not present

## 2024-11-05 DIAGNOSIS — C801 Malignant (primary) neoplasm, unspecified: Secondary | ICD-10-CM | POA: Diagnosis not present

## 2024-11-05 DIAGNOSIS — C787 Secondary malignant neoplasm of liver and intrahepatic bile duct: Secondary | ICD-10-CM | POA: Diagnosis not present

## 2024-11-05 DIAGNOSIS — I480 Paroxysmal atrial fibrillation: Secondary | ICD-10-CM | POA: Diagnosis not present

## 2024-11-05 DIAGNOSIS — R160 Hepatomegaly, not elsewhere classified: Secondary | ICD-10-CM | POA: Diagnosis not present

## 2024-11-05 DIAGNOSIS — R110 Nausea: Secondary | ICD-10-CM | POA: Diagnosis not present

## 2024-11-05 DIAGNOSIS — R1011 Right upper quadrant pain: Secondary | ICD-10-CM | POA: Diagnosis not present

## 2024-11-05 DIAGNOSIS — R911 Solitary pulmonary nodule: Secondary | ICD-10-CM

## 2024-11-05 DIAGNOSIS — Z8673 Personal history of transient ischemic attack (TIA), and cerebral infarction without residual deficits: Secondary | ICD-10-CM | POA: Diagnosis not present

## 2024-11-05 DIAGNOSIS — I951 Orthostatic hypotension: Secondary | ICD-10-CM | POA: Diagnosis not present

## 2024-11-05 LAB — HEPATIC FUNCTION PANEL
ALT: 40 U/L (ref 0–44)
AST: 53 U/L — ABNORMAL HIGH (ref 15–41)
Albumin: 2.9 g/dL — ABNORMAL LOW (ref 3.5–5.0)
Alkaline Phosphatase: 485 U/L — ABNORMAL HIGH (ref 38–126)
Bilirubin, Direct: 0.3 mg/dL — ABNORMAL HIGH (ref 0.0–0.2)
Indirect Bilirubin: 0.6 mg/dL (ref 0.3–0.9)
Total Bilirubin: 0.9 mg/dL (ref 0.0–1.2)
Total Protein: 5.9 g/dL — ABNORMAL LOW (ref 6.5–8.1)

## 2024-11-05 LAB — CBC WITH DIFFERENTIAL/PLATELET
Abs Immature Granulocytes: 0.06 K/uL (ref 0.00–0.07)
Basophils Absolute: 0.1 K/uL (ref 0.0–0.1)
Basophils Relative: 1 %
Eosinophils Absolute: 0.1 K/uL (ref 0.0–0.5)
Eosinophils Relative: 1 %
HCT: 27.6 % — ABNORMAL LOW (ref 36.0–46.0)
Hemoglobin: 8.1 g/dL — ABNORMAL LOW (ref 12.0–15.0)
Immature Granulocytes: 1 %
Lymphocytes Relative: 16 %
Lymphs Abs: 1.4 K/uL (ref 0.7–4.0)
MCH: 24.8 pg — ABNORMAL LOW (ref 26.0–34.0)
MCHC: 29.3 g/dL — ABNORMAL LOW (ref 30.0–36.0)
MCV: 84.7 fL (ref 80.0–100.0)
Monocytes Absolute: 0.8 K/uL (ref 0.1–1.0)
Monocytes Relative: 10 %
Neutro Abs: 6 K/uL (ref 1.7–7.7)
Neutrophils Relative %: 71 %
Platelets: 261 K/uL (ref 150–400)
RBC: 3.26 MIL/uL — ABNORMAL LOW (ref 3.87–5.11)
RDW: 19.1 % — ABNORMAL HIGH (ref 11.5–15.5)
WBC: 8.4 K/uL (ref 4.0–10.5)
nRBC: 0 % (ref 0.0–0.2)

## 2024-11-05 LAB — BASIC METABOLIC PANEL WITH GFR
Anion gap: 10 (ref 5–15)
BUN: 17 mg/dL (ref 8–23)
CO2: 27 mmol/L (ref 22–32)
Calcium: 8.3 mg/dL — ABNORMAL LOW (ref 8.9–10.3)
Chloride: 101 mmol/L (ref 98–111)
Creatinine, Ser: 1.21 mg/dL — ABNORMAL HIGH (ref 0.44–1.00)
GFR, Estimated: 44 mL/min — ABNORMAL LOW (ref >60)
Glucose, Bld: 207 mg/dL — ABNORMAL HIGH (ref 70–99)
Potassium: 3 mmol/L — ABNORMAL LOW (ref 3.5–5.1)
Sodium: 138 mmol/L (ref 135–145)

## 2024-11-05 LAB — GLUCOSE, CAPILLARY
Glucose-Capillary: 195 mg/dL — ABNORMAL HIGH (ref 70–99)
Glucose-Capillary: 163 mg/dL — ABNORMAL HIGH (ref 70–99)
Glucose-Capillary: 211 mg/dL — ABNORMAL HIGH (ref 70–99)

## 2024-11-05 LAB — HEMOGLOBIN A1C
Hgb A1c MFr Bld: 10.2 % — ABNORMAL HIGH (ref 4.8–5.6)
Mean Plasma Glucose: 246.04 mg/dL

## 2024-11-05 LAB — APTT
aPTT: >200 s (ref 24–36)
aPTT: >200 s (ref 24–36)

## 2024-11-05 LAB — HEPARIN LEVEL (UNFRACTIONATED)
Heparin Unfractionated: >1.1 [IU]/mL — ABNORMAL HIGH (ref 0.30–0.70)
Heparin Unfractionated: >1.1 [IU]/mL — ABNORMAL HIGH (ref 0.30–0.70)

## 2024-11-05 LAB — CBG MONITORING, ED
Glucose-Capillary: 173 mg/dL — ABNORMAL HIGH (ref 70–99)
Glucose-Capillary: 138 mg/dL — ABNORMAL HIGH (ref 70–99)

## 2024-11-05 MED ORDER — GADOBUTROL 1 MMOL/ML IV SOLN
7.0000 mL | Freq: Once | INTRAVENOUS | Status: AC | PRN
  Administered 2024-11-05: 7 mL via INTRAVENOUS

## 2024-11-05 MED ORDER — POTASSIUM CHLORIDE CRYS ER 20 MEQ PO TBCR
40.0000 meq | EXTENDED_RELEASE_TABLET | Freq: Once | ORAL | Status: AC
  Administered 2024-11-05: 40 meq via ORAL
  Filled 2024-11-05: qty 2

## 2024-11-05 MED ORDER — HEPARIN (PORCINE) 25000 UT/250ML-% IV SOLN
950.0000 [IU]/h | INTRAVENOUS | Status: DC
  Administered 2024-11-05: 950 [IU]/h via INTRAVENOUS
  Filled 2024-11-05: qty 250

## 2024-11-05 MED ORDER — SIMETHICONE 40 MG/0.6ML PO SUSP
80.0000 mg | Freq: Once | ORAL | Status: AC
  Administered 2024-11-05: 80 mg via ORAL
  Filled 2024-11-05: qty 1.2

## 2024-11-05 MED ORDER — HYDRALAZINE HCL 20 MG/ML IJ SOLN
5.0000 mg | Freq: Once | INTRAMUSCULAR | Status: AC
  Administered 2024-11-05: 5 mg via INTRAVENOUS
  Filled 2024-11-05: qty 1

## 2024-11-05 MED ORDER — HEPARIN (PORCINE) 25000 UT/250ML-% IV SOLN
750.0000 [IU]/h | INTRAVENOUS | Status: DC
  Administered 2024-11-05: 750 [IU]/h via INTRAVENOUS

## 2024-11-05 MED ORDER — INSULIN ASPART 100 UNIT/ML IJ SOLN
0.0000 [IU] | Freq: Three times a day (TID) | INTRAMUSCULAR | Status: DC
  Administered 2024-11-05 (×2): 1 [IU] via SUBCUTANEOUS
  Administered 2024-11-06: 2 [IU] via SUBCUTANEOUS
  Administered 2024-11-06 – 2024-11-08 (×5): 1 [IU] via SUBCUTANEOUS
  Administered 2024-11-08: 2 [IU] via SUBCUTANEOUS
  Administered 2024-11-09 (×2): 1 [IU] via SUBCUTANEOUS
  Filled 2024-11-05 (×3): qty 2
  Filled 2024-11-05 (×7): qty 1
  Filled 2024-11-05: qty 2

## 2024-11-05 NOTE — ED Notes (Signed)
 Pt being transported to MRI. SWOT called, Amanda Stewart will meet the pt at MRI to monitor them during their imaging. Report given.

## 2024-11-05 NOTE — ED Notes (Signed)
 Pt placed on bed pan and brief changed by this RN.

## 2024-11-05 NOTE — Progress Notes (Signed)
 Remote PPM Transmission

## 2024-11-05 NOTE — Progress Notes (Addendum)
 PHARMACY - ANTICOAGULATION CONSULT NOTE  Pharmacy Consult for heparin  Indication: atrial fibrillation, hx of CVA  Allergies  Allergen Reactions   Metformin  And Related Other (See Comments)    Diarrhea     Patient Measurements: Height: 5' 4 (162.6 cm) Weight: 71.8 kg (158 lb 4.6 oz) (Wt from 07/17/2024) IBW/kg (Calculated) : 54.7 HEPARIN  DW (KG): 69.4  Vital Signs: Temp: 98.2 F (36.8 C) (12/10 0446) Temp Source: Oral (12/10 0308) BP: 146/56 (12/10 0446) Pulse Rate: 73 (12/10 0446)  Labs: Recent Labs    11/04/24 1542 11/05/24 0152  HGB 8.9* 8.1*  HCT 30.0* 27.6*  PLT 302 261  CREATININE 1.17* 1.21*    Estimated Creatinine Clearance: 33 mL/min (A) (by C-G formula based on SCr of 1.21 mg/dL (H)).   Medical History: Past Medical History:  Diagnosis Date   Allergic rhinitis    Anemia    Arrhythmia    CAD (coronary artery disease)    Chest pain    Chronic kidney disease, stage 3a (HCC) 08/11/2023   Combined systolic and diastolic heart failure (HCC)    Diabetes mellitus without complication (HCC)    Diabetic peripheral neuropathy (HCC)    GERD (gastroesophageal reflux disease)    Heart block    Hyperlipidemia    Hypertension    Left bundle branch block    Low back pain    Myocardial infarction (HCC)    Peripheral arterial disease    Retinopathy    Assessment: 8 yoF presented to ED with abdominal pain found to have liver lesion s/p elevated LFTs. Pharmacy consulted to dose heparin  for afib while eliquis  on hold. 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, CHB s/p PPM, CAD, PAD, T2DM, HTN, HLD, afib (on eliquis )  -Last dose of eliquis  5mg  PO BID: 12/9 AM -Hgb 8s (~8 since 10/2023), plts 261 -Previously on triple therapy, but given hx of anemia plavix  d/c'd and takes ASA/eliquis    Goal of Therapy:  Heparin  level 0.3-0.7 units/ml aPTT 66-102 seconds Monitor platelets by anticoagulation protocol: Yes   Plan:  No heparin  bolus given recent eliquis   use Start heparin  infusion at 950 units/hr Check aPTT and anti-Xa level in 8 hours and daily while on heparin  Monitor with aPTTs until correlates with heparin  levels Continue to monitor H&H and platelets  Lynwood Poplar, PharmD, BCPS Clinical Pharmacist 11/05/2024 5:01 AM

## 2024-11-05 NOTE — Inpatient Diabetes Management (Signed)
 Inpatient Diabetes Program Recommendations  AACE/ADA: New Consensus Statement on Inpatient Glycemic Control (2015)  Target Ranges:  Prepandial:   less than 140 mg/dL      Peak postprandial:   less than 180 mg/dL (1-2 hours)      Critically ill patients:  140 - 180 mg/dL   Lab Results  Component Value Date   GLUCAP 173 (H) 11/05/2024   HGBA1C 10.2 (H) 11/05/2024    Latest Reference Range & Units 08/07/21 06:34 08/12/23 05:31 11/05/24 01:52  Hemoglobin A1C 4.8 - 5.6 % 9.3 (H) 10.1 (H) 10.2 (H)  (H): Data is abnormally high  Diabetes history: DM2 Outpatient Diabetes medications:  Jardiance  25 mg daily Current orders for Inpatient glycemic control: Novolog  0-6 units tid correction  Inpatient Diabetes Program Recommendations:    Noted A1c 10.2 Will follow while hospitalized and discuss with pt.  Thank you, Klani Caridi E. Coreen Shippee, RN, MSN, CNS, CDCES  Diabetes Coordinator Inpatient Glycemic Control Team Team Pager [phone] (8am-5pm) 11/05/2024 10:52 AM

## 2024-11-05 NOTE — Care Management Obs Status (Signed)
 MEDICARE OBSERVATION STATUS NOTIFICATION   Patient Details  Name: LERIN JECH MRN: [id number] Date of Birth: 18-Apr-1941   Medicare Observation Status Notification Given:  Yes    Nena LITTIE Coffee, RN 11/05/2024, 1:31 PM

## 2024-11-05 NOTE — Progress Notes (Signed)
 PHARMACY - ANTICOAGULATION CONSULT NOTE  Pharmacy Consult for heparin  Indication: atrial fibrillation, hx of CVA  Allergies  Allergen Reactions   Metformin  And Related Other (See Comments)    Diarrhea     Patient Measurements: Height: 5' 4 (162.6 cm) Weight: 71.8 kg (158 lb 4.6 oz) (Wt from 07/17/2024) IBW/kg (Calculated) : 54.7 HEPARIN  DW (KG): 69.4  Vital Signs: Temp: 97.5 F (36.4 C) (12/10 1604) Temp Source: Oral (12/10 1421) BP: 145/69 (12/10 1604) Pulse Rate: 70 (12/10 1604)  Labs: Recent Labs    11/04/24 1542 11/05/24 0152 11/05/24 1443  HGB 8.9* 8.1*  --   HCT 30.0* 27.6*  --   PLT 302 261  --   APTT  --   --  >200*  HEPARINUNFRC  --   --  >1.10*  CREATININE 1.17* 1.21*  --     Estimated Creatinine Clearance: 33 mL/min (A) (by C-G formula based on SCr of 1.21 mg/dL (H)).   Medical History: Past Medical History:  Diagnosis Date   Allergic rhinitis    Anemia    Arrhythmia    CAD (coronary artery disease)    Chest pain    Chronic kidney disease, stage 3a (HCC) 08/11/2023   Combined systolic and diastolic heart failure (HCC)    Diabetes mellitus without complication (HCC)    Diabetic peripheral neuropathy (HCC)    GERD (gastroesophageal reflux disease)    Heart block    Hyperlipidemia    Hypertension    Left bundle branch block    Low back pain    Myocardial infarction (HCC)    Peripheral arterial disease    Retinopathy    Assessment: 44 yoF presented to ED with abdominal pain found to have liver lesion s/p elevated LFTs. Pharmacy consulted to dose heparin  for afib while eliquis  on hold. 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 Initial levels were thought to be  possibly drawn from same arm where heparin  was running. Repeat levels drawn which resulted in aPTT >200 sec (drawn from arm opposite where heparin  running). No bleeding noted.  Goal of Therapy:  Heparin  level 0.3-0.7 units/ml aPTT 66-102 seconds Monitor platelets by anticoagulation protocol: Yes   Plan:  Hold heparin  x 1 hour Restart at 750 units/hr Will f/u 8 hr aPTT post restart  Vito Ralph, PharmD, BCPS Please see amion for complete clinical pharmacist phone list 11/05/2024 6:04 PM

## 2024-11-05 NOTE — Plan of Care (Signed)

## 2024-11-05 NOTE — Progress Notes (Signed)
 PROGRESS NOTE    Amanda Stewart  FMW:[id number] DOB: 20-Oct-1941 DOA: 11/04/2024 PCP: Shelda Atlas, MD  Subjective:  No acute events overnight. Seen and examined at bedside after MRI scan. Reports having intermittent heartburn and nausea still. Denies any constipation.    Hospital Course:  As per H&P: 83 y.o. female with history of stroke with carotid stenosis status post TCAR, chronic atrial fibrillation, complete heart block status post pacemaker placement, diabetes mellitus type 2, chronic anemia brought to the ER after patient was complaining of abdominal pain.  As per patient's daughter with whom I spoke over the telephone patient has been complaining of epigastric discomfort with poor appetite for the last 2 to 3 weeks.  Did not have any nausea vomiting or diarrhea fever or chills.  Patient's amlodipine  was recently discontinued due to low blood pressure.  Gabapentin also was discontinued recently.    Assessment and Plan:  Liver lesions Possible colon cancer Etiology unclear, concern for metastatic disease Viral hepatitis panel pending Will check AFP MRI abdomen showed diffuse liver metastases, short segment annular wall thickening in proximal ascending colon, highly suspicious for primary colon carcinoma, enlarged R pericolonic lymph nodes  Lung lesion MRI abdomen showed new ill-defined T2 hyperintensity in posterior L lower lobe Etiology unclear Will get CT chest  Atrial fibrillation Complete heart block status post pacemaker Cont metoprolol   Hold home eliquis  for planned IR procedure Cont heparin  drip given CVA roughly 1 year ago  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 Hold home eliquis  as elsewhere Cont heparin  drip  Hold home statin given elevated LFTs  Chronic anemia Hgb 8.1 < 8.9, baseline 7-8 Anticoagulation as elsewhere Monitor CBC  T2DM HgbA1c 10.1 Cont SSI with FS ACHS  CKD 3 Cr 1.21 < 1.17, baseline 0.9-1.1 Monitor  clinically  HTN Cont metoprolol   Possible cognitive impairment As per daughter, some concerns of memory loss more recently May need outpatient neuropsychological testing for dementia  DVT prophylaxis: Place and maintain sequential compression device Start: 11/05/24 0410  IV Heparin    Code Status: Limited: Do not attempt resuscitation (DNR) -DNR-LIMITED -Do Not Intubate/DNI  Disposition Plan: TBD pending clinical course Reason for continuing need for hospitalization: medical acuity, need IR biopsy  Objective: Vitals:   11/05/24 0722 11/05/24 0757 11/05/24 0800 11/05/24 1110  BP:  (!) 176/71 (!) 164/74 (!) 144/73  Pulse:   70 70  Resp:   19 20  Temp:      TempSrc:      SpO2: 99%  98% 100%  Weight:      Height:       No intake or output data in the 24 hours ending 11/05/24 1143 Filed Weights   11/05/24 0500  Weight: 71.8 kg    Examination:  Physical Exam Vitals and nursing note reviewed.  Constitutional:      General: She is not in acute distress.    Appearance: She is ill-appearing.     Comments: Weak, frail  HENT:     Head: Normocephalic and atraumatic.  Cardiovascular:     Rate and Rhythm: Normal rate and regular rhythm.     Pulses: Normal pulses.     Heart sounds: Normal heart sounds.  Pulmonary:     Effort: Pulmonary effort is normal.     Breath sounds: Normal breath sounds.  Abdominal:     General: Bowel sounds are normal. There is no distension.     Palpations: Abdomen is soft.     Tenderness: There  is no abdominal tenderness. There is no guarding or rebound.  Neurological:     Mental Status: She is alert. Mental status is at baseline.     Data Reviewed: I have personally reviewed following labs and imaging studies  CBC: Recent Labs  Lab 11/04/24 1542 11/05/24 0152  WBC 8.4 8.4  NEUTROABS  --  6.0  HGB 8.9* 8.1*  HCT 30.0* 27.6*  MCV 86.2 84.7  PLT 302 261   Basic Metabolic Panel: Recent Labs  Lab 11/04/24 1542 11/05/24 0152  NA 139  138  K 4.0 3.0*  CL 102 101  CO2 25 27  GLUCOSE 286* 207*  BUN 19 17  CREATININE 1.17* 1.21*  CALCIUM  7.9* 8.3*   GFR: Estimated Creatinine Clearance: 33 mL/min (A) (by C-G formula based on SCr of 1.21 mg/dL (H)). Liver Function Tests: Recent Labs  Lab 11/04/24 1542 11/05/24 0152  AST 63* 53*  ALT 39 40  ALKPHOS 467* 485*  BILITOT 2.1* 0.9  PROT 5.6* 5.9*  ALBUMIN 2.9* 2.9*   Recent Labs  Lab 11/04/24 1542  LIPASE 35   No results for input(s): AMMONIA in the last 168 hours. Coagulation Profile: No results for input(s): INR, PROTIME in the last 168 hours. Cardiac Enzymes: No results for input(s): CKTOTAL, CKMB, CKMBINDEX, TROPONINI in the last 168 hours. ProBNP, BNP (last 5 results) No results for input(s): PROBNP, BNP in the last 8760 hours. HbA1C: Recent Labs    11/05/24 0152  HGBA1C 10.2*   CBG: Recent Labs  Lab 11/04/24 1549 11/05/24 0743  GLUCAP 281* 173*   Lipid Profile: No results for input(s): CHOL, HDL, LDLCALC, TRIG, CHOLHDL, LDLDIRECT in the last 72 hours. Thyroid Function Tests: No results for input(s): TSH, T4TOTAL, FREET4, T3FREE, THYROIDAB in the last 72 hours. Anemia Panel: No results for input(s): VITAMINB12, FOLATE, FERRITIN, TIBC, IRON, RETICCTPCT in the last 72 hours. Sepsis Labs: No results for input(s): PROCALCITON, LATICACIDVEN in the last 168 hours.  No results found for this or any previous visit (from the past 240 hours).   Radiology Studies: MR LIVER W WO CONTRAST Result Date: 11/05/2024 CLINICAL DATA:  Abdominal pain. Elevated bilirubin. Hepatic masses on recent CT. EXAM: MRI ABDOMEN WITHOUT AND WITH CONTRAST TECHNIQUE: Multiplanar multisequence MR imaging of the abdomen was performed both before and after the administration of intravenous contrast. CONTRAST:  7mL GADAVIST GADOBUTROL 1 MMOL/ML IV SOLN COMPARISON:  CT on 11/04/2024 FINDINGS: Lower chest: Focal ill-defined T2  hyperintensity is seen in the posterior left lower lobe which is new since recent CT, suspicious for infectious or inflammatory etiology. Hepatobiliary: Numerous heterogeneous hypovascular masses are seen throughout the liver, consistent with diffuse liver metastases. Largest index mass in the lateral right hepatic lobe measures 5.2 x 4.1 cm. T1 hyperintense gallbladder sludge is seen, without signs of cholecystitis. No evidence of biliary ductal dilatation. Pancreas:  No mass or inflammatory changes. Spleen:  Within normal limits in size and appearance. Adrenals/Urinary Tract: Tiny benign-appearing Bosniak category 2 left renal cyst noted (No followup imaging is recommended) . No suspicious masses identified. No evidence of hydronephrosis. Stomach/Bowel: A short segment area of annular wall thickening is seen in the proximal ascending colon measuring approximately 5 cm in length, highly suspicious for primary colon carcinoma. No evidence of bowel obstruction. Vascular/Lymphatic: Several borderline enlarged right pericolonic lymph nodes are also seen, suspicious for local lymph node metastases. No other sites of lymphadenopathy identified. No acute vascular findings. Other:  None. Musculoskeletal:  No suspicious bone lesions identified. IMPRESSION:  Diffuse liver metastases. Short segment annular wall thickening in the proximal ascending colon, highly suspicious for primary colon carcinoma. Several borderline enlarged right pericolonic lymph nodes, suspicious for local lymph node metastases. Focal ill-defined T2 hyperintensity in the posterior left lower lobe, new since recent CT, suspicious for pneumonia or other inflammatory etiology. Consider further evaluation with chest radiographs or chest CT. Electronically Signed   By: Norleen DELENA Kil M.D.   On: 11/05/2024 11:11   CT ABDOMEN PELVIS W CONTRAST Result Date: 11/04/2024 EXAM: CT ABDOMEN AND PELVIS WITH CONTRAST 11/04/2024 08:23:52 PM TECHNIQUE: CT of the abdomen  and pelvis was performed with the administration of intravenous contrast (75 mL iohexol  (OMNIPAQUE ) 350 MG/ML injection). Multiplanar reformatted images are provided for review. Automated exposure control, iterative reconstruction, and/or weight-based adjustment of the mA/kV was utilized to reduce the radiation dose to as low as reasonably achievable. COMPARISON: None available. CLINICAL HISTORY: abd pain, elevated bilirubin, vomiting FINDINGS: LOWER CHEST: Aortic valve leaflet calcification. Partially visualized cardiac implantable electronic device (defibrillator). LIVER: Multiple lobulated fluid density lesions within the liver that appears slightly heterogeneous centrally with some vascularity running through the lesions. GALLBLADDER AND BILE DUCTS: Gallbladder is unremarkable. No biliary ductal dilatation. SPLEEN: Multiple subcentimeter splenules noted. The spleen is unremarkable. PANCREAS: No acute abnormality. ADRENAL GLANDS: Bilateral adrenal hyperplasia with no definite nodularity. KIDNEYS, URETERS AND BLADDER: No stones in the kidneys or ureters. No hydronephrosis. No perinephric or periureteral stranding. Urinary bladder is unremarkable. GI AND BOWEL: Small hiatal hernia. No small or large bowel thickening or dilatation. The appendix is unremarkable. PERITONEUM AND RETROPERITONEUM: No ascites. No free air. VASCULATURE: Severe atherosclerotic plaque of the aorta. LYMPH NODES: No lymphadenopathy. REPRODUCTIVE ORGANS: No acute abnormality. BONES AND SOFT TISSUES: Multilevel degenerative changes of the spine. No acute osseous abnormality. No focal soft tissue abnormality. IMPRESSION: 1. No acute findings in the abdomen or pelvis. 2. Multiple lobulated fluid-density hepatic lesions, slightly heterogeneous centrally with internal vascularity; recommend dedicated liver MRI with contrast for characterization. 3. Other, non-acute and/or normal findings as above. Electronically signed by: Kate Plummer MD  11/04/2024 08:34 PM EST RP Workstation: [id number]   US  Abdomen Limited Result Date: 11/04/2024 EXAM: Right Upper Quadrant Abdominal Ultrasound 11/04/2024 07:37:00 PM TECHNIQUE: Real-time ultrasonography of the right upper quadrant of the abdomen was performed. COMPARISON: None available. CLINICAL HISTORY: RUQ US  RUQ US  FINDINGS: LIVER: The liver demonstrates normal echogenicity. No intrahepatic biliary ductal dilatation. Multiple hypoechoic lesions noted in the liver, particularly the left hepatic lobe measuring up to 2.4 cm. These appear to reflect solid mass lesions. BILIARY SYSTEM: No pericholecystic fluid or wall thickening. No cholelithiasis. Common bile duct is within normal limits measuring 4 mm. OTHER: No right upper quadrant ascites. IMPRESSION: 1. Multiple hypoechoic solid liver masses, up to 2.4 cm, suspicious for hepatic neoplasm; recommend dedicated multiphasic liver MRI or contrast-enhanced CT for further characterization. Electronically signed by: Franky Crease MD 11/04/2024 07:40 PM EST RP Workstation: [id number]   DG Chest 1 View Result Date: 11/04/2024 EXAM: 1 VIEW(S) XRAY OF THE CHEST 11/04/2024 06:13:00 PM COMPARISON: 11/10/2023. CLINICAL HISTORY: cough FINDINGS: LUNGS AND PLEURA: No focal pulmonary opacity. No pleural effusion. No pneumothorax. HEART AND MEDIASTINUM: Left pacer remains in place, unchanged. BONES AND SOFT TISSUES: No acute osseous abnormality. IMPRESSION: 1. No acute cardiopulmonary process. 2. Left pacer in place without interval change. Electronically signed by: Franky Crease MD 11/04/2024 06:34 PM EST RP Workstation: [id number]    Scheduled Meds:  insulin  aspart  0-6 Units Subcutaneous TID WC  metoprolol  tartrate  25 mg Oral BID   pantoprazole   40 mg Oral Q0600   Continuous Infusions:  heparin  950 Units/hr (11/05/24 0811)     LOS: 0 days   Norval Bar, MD  [facility]  11/05/2024, 11:43 AM

## 2024-11-05 NOTE — Consult Note (Signed)
 Chief Complaint: Patient was seen in consultation today for liver lesions.   Referring Physician(s): Dr. Cosette  Supervising Physician: Philip Cornet  Patient Status: [name] - In-pt  History of Present Illness: Amanda Stewart is an 83 y.o. female with a medical history significant for CAD/MI, PAD, CKD III, DM, heart failure, HTN, stroke, chronic atrial fibrillation, complete heart block s/p pacemaker and chronic anemia. She presented to the ED 11/04/24 with abdominal pain and poor appetite for the last several weeks. Work up in the ED showed elevated LFTs and imaging showed multiple liver lesions.   MR Liver 11/05/24 Hepatobiliary: Numerous heterogeneous hypovascular masses are seen throughout the liver, consistent with diffuse liver metastases. Largest index mass in the lateral right hepatic lobe measures 5.2 x 4.1 cm.  IMPRESSION: 1. Diffuse liver metastases. 2. Short segment annular wall thickening in the proximal ascending colon, highly suspicious for primary colon carcinoma. 3. Several borderline enlarged right pericolonic lymph nodes, suspicious for local lymph node metastases. 4. Focal ill-defined T2 hyperintensity in the posterior left lower lobe, new since recent CT, suspicious for pneumonia or other inflammatory etiology. Consider further evaluation with chest radiographs or chest CT.  Interventional Radiology has been asked to evaluate this patient for an image-guided liver lesion biopsy. Imaging reviewed and procedure approved by Dr. Philip.   Past Medical History:  Diagnosis Date   Allergic rhinitis    Anemia    Arrhythmia    CAD (coronary artery disease)    Chest pain    Chronic kidney disease, stage 3a (HCC) 08/11/2023   Combined systolic and diastolic heart failure (HCC)    Diabetes mellitus without complication (HCC)    Diabetic peripheral neuropathy (HCC)    GERD (gastroesophageal reflux disease)    Heart block    Hyperlipidemia    Hypertension    Left  bundle branch block    Low back pain    Myocardial infarction (HCC)    Peripheral arterial disease    Retinopathy     Past Surgical History:  Procedure Laterality Date   ABDOMINAL HYSTERECTOMY     LEFT AND RIGHT HEART CATHETERIZATION WITH CORONARY ANGIOGRAM N/A 05/26/2014   Procedure: LEFT AND RIGHT HEART CATHETERIZATION WITH CORONARY ANGIOGRAM;  Surgeon: Salena GORMAN Negri, MD;  Location: [facility] CATH LAB;  Service: Cardiovascular;  Laterality: N/A;   PACEMAKER IMPLANT N/A 08/08/2021   Procedure: PACEMAKER IMPLANT;  Surgeon: Cindie Ole DASEN, MD;  Location: [facility] INVASIVE CV LAB;  Service: Cardiovascular;  Laterality: N/A;   TEMPORARY PACEMAKER N/A 08/07/2021   Procedure: TEMPORARY PACEMAKER;  Surgeon: Levern Hutching, MD;  Location: [facility] INVASIVE CV LAB;  Service: Cardiovascular;  Laterality: N/A;   TRANSCAROTID ARTERY REVASCULARIZATION  Right 09/04/2023   Procedure: Transcarotid Artery Revascularization;  Surgeon: Pearline Norman GORMAN, MD;  Location: [facility] OR;  Service: Vascular;  Laterality: Right;   ULTRASOUND GUIDANCE FOR VASCULAR ACCESS Left 09/04/2023   Procedure: ULTRASOUND GUIDANCE FOR VASCULAR ACCESS;  Surgeon: Pearline Norman GORMAN, MD;  Location: [facility] OR;  Service: Vascular;  Laterality: Left;    Allergies: Metformin  and related  Medications: Prior to Admission medications   Medication Sig Start Date End Date Taking? Authorizing Provider  acetaminophen  (TYLENOL ) 325 MG tablet Take 1-2 tablets (325-650 mg total) by mouth every 4 (four) hours as needed for mild pain. Patient taking differently: Take 325-650 mg by mouth as needed for mild pain (pain score 1-3) or moderate pain (pain score 4-6). 08/10/21  Yes Negri Salena, MD  albuterol  (VENTOLIN  HFA) 108 [phone] Base)  MCG/ACT inhaler Inhale 2 puffs into the lungs every 6 (six) hours as needed for wheezing or shortness of breath.   Yes [provider]  aspirin  EC 81 MG tablet Take 81 mg by mouth daily.   Yes [provider]  atorvastatin   (LIPITOR ) 80 MG tablet Take 1 tablet (80 mg total) by mouth daily. 09/07/23  Yes Samtani, Jai-Gurmukh, MD  ELIQUIS  5 MG TABS tablet TAKE 1 TABLET(5 MG) BY MOUTH TWICE DAILY 10/06/24  Yes Fenton, Clint R, PA  empagliflozin  (JARDIANCE ) 25 MG TABS tablet Take 25 mg by mouth daily.   Yes [provider]  escitalopram (LEXAPRO) 5 MG tablet Take 5 mg by mouth at bedtime. 10/27/24  Yes [provider]  losartan  (COZAAR ) 100 MG tablet Take 100 mg by mouth daily. 10/01/23  Yes [provider]  metoprolol  tartrate (LOPRESSOR ) 25 MG tablet Take 1 tablet (25 mg total) by mouth 2 (two) times daily. 08/13/23  Yes Patsy Lenis, MD  Multiple Vitamin (MULTIVITAMIN WITH MINERALS) TABS tablet Take 1 tablet by mouth daily.   Yes [provider]  nitroGLYCERIN  (NITROSTAT ) 0.4 MG SL tablet Place 1 tablet (0.4 mg total) under the tongue every 5 (five) minutes x 3 doses as needed for chest pain. 12/12/14  Yes Claudene Pacific, MD  pantoprazole  (PROTONIX ) 40 MG tablet Take 1 tablet (40 mg total) by mouth daily at 6 (six) AM. 08/11/21  Yes Claudene Pacific, MD  amLODipine  (NORVASC ) 5 MG tablet Take 5 mg by mouth daily. Patient not taking: Reported on 11/05/2024 09/25/23   [provider]  chlorhexidine  (PERIDEX ) 0.12 % solution Use as directed 5 mLs in the mouth or throat 4 (four) times daily. Patient not taking: Reported on 11/05/2024 10/29/23   [provider]  potassium chloride  (KLOR-CON ) 10 MEQ tablet Take 10 mEq by mouth daily. Patient not taking: Reported on 11/05/2024 10/02/23   [provider]     Family History  Problem Relation Age of Onset   Diabetes Mother    Diabetes Father    Stroke Father     Social History   Socioeconomic History   Marital status: Married    Spouse name: Not on file   Number of children: Not on file   Years of education: Not on file   Highest education level: Not on file  Occupational History   Not on file  Tobacco Use    Smoking status: Never   Smokeless tobacco: Never   Tobacco comments:    Never smoked 10/05/23  Vaping Use   Vaping status: Never Used  Substance and Sexual Activity   Alcohol use: Yes    Alcohol/week: 1.0 standard drink of alcohol    Types: 1 Glasses of wine per week    Comment: 1 glass of wine once a month 10/05/23   Drug use: No   Sexual activity: Not on file  Other Topics Concern   Not on file  Social History Narrative   Not on file   Social Drivers of Health   Financial Resource Strain: Not on file  Food Insecurity: No Food Insecurity (11/11/2023)   Hunger Vital Sign    Worried About Running Out of Food in the Last Year: Never true    Ran Out of Food in the Last Year: Never true  Transportation Needs: No Transportation Needs (11/11/2023)   PRAPARE - Administrator, Civil Service (Medical): No    Lack of Transportation (Non-Medical): No  Physical Activity: Not on  file  Stress: Not on file  Social Connections: Not on file    Review of Systems: A 12 point ROS discussed and pertinent positives are indicated in the HPI above.  All other systems are negative.  Review of Systems  All other systems reviewed and are negative.   Vital Signs: BP (!) 144/73   Pulse 70   Temp 98.2 F (36.8 C)   Resp 20   Ht 5' 4 (1.626 m)   Wt 158 lb 4.6 oz (71.8 kg) Comment: Wt from 07/17/2024  SpO2 100%   BMI 27.17 kg/m   Physical Exam Constitutional:      General: She is not in acute distress.    Appearance: She is not ill-appearing.  HENT:     Mouth/Throat:     Mouth: Mucous membranes are moist.     Pharynx: Oropharynx is clear.  Cardiovascular:     Rate and Rhythm: Normal rate.  Pulmonary:     Effort: Pulmonary effort is normal.  Abdominal:     Tenderness: There is no abdominal tenderness.  Skin:    General: Skin is warm and dry.  Neurological:     Mental Status: She is alert and oriented to person, place, and time.     Labs:  CBC: Recent Labs     11/14/23 0333 11/15/23 0330 11/16/23 0401 11/04/24 1542 11/05/24 0152  WBC 7.0 6.8  --  8.4 8.4  HGB 8.3* 7.5* 8.3* 8.9* 8.1*  HCT 25.3* 23.3* 24.3* 30.0* 27.6*  PLT 189 225  --  302 261    COAGS: Recent Labs    11/10/23 2130 11/12/23 1410  INR 1.8* 1.3*    BMP: Recent Labs    11/14/23 0333 11/15/23 0330 11/04/24 1542 11/05/24 0152  NA 143 140 139 138  K 3.9 3.7 4.0 3.0*  CL 111 107 102 101  CO2 24 26 25 27   GLUCOSE 126* 140* 286* 207*  BUN 7* 8 19 17   CALCIUM  8.4* 8.5* 7.9* 8.3*  CREATININE 0.92 1.05* 1.17* 1.21*  GFRNONAA >60 52* 46* 44*    LIVER FUNCTION TESTS: Recent Labs    11/10/23 2130 11/04/24 1542 11/05/24 0152  BILITOT 0.6 2.1* 0.9  AST 17 63* 53*  ALT 14 39 40  ALKPHOS 83 467* 485*  PROT 6.4* 5.6* 5.9*  ALBUMIN 3.0* 2.9* 2.9*    TUMOR MARKERS: No results for input(s): AFPTM, CEA, CA199, CHROMGRNA in the last 8760 hours.  Assessment and Plan:  Liver lesions: Jenkins DOROTHA Surita, 82 year old female, is tentatively scheduled 11/07/24 for an image-guided liver lesion biopsy. The procedure is scheduled for 11/07/24 to allow for Plavix  washout. The procedure was discussed with patient at the bedside and she is agreeable to proceed.   Risks and benefits of liver lesion biopsy were discussed with the patient and/or patient's family including, but not limited to bleeding, infection, damage to adjacent structures or low yield requiring additional tests.  All of the questions were answered and there is agreement to proceed. She will be NPO at midnight 11/07/24. CBC and PT/INR will be obtained that morning. The heparin  infusion will need to be held 4 hours and IR will coordinate the timing of this with the unit nurse Thursday afternoon/Friday morning.   Consent signed and in IR.   Thank you for this interesting consult.  I greatly enjoyed meeting VANETTA RULE and look forward to participating in their care.  A copy of this report was sent to the  requesting provider on  this date.  Electronically Signed: Warren Dais, AGACNP-BC 11/05/2024, 12:06 PM   I spent a total of 20 Minutes    in face to face in clinical consultation, greater than 50% of which was counseling/coordinating care for liver lesions.

## 2024-11-05 NOTE — ED Notes (Signed)
 Pt found attempting to get out of bed to use the restroom. After redirection, pt was placed on a bed pan and able to use the bathroom.

## 2024-11-05 NOTE — Progress Notes (Signed)
°   11/05/24 1335  TOC Brief Assessment  Insurance and Status Reviewed  Patient has primary care physician Yes  Home environment has been reviewed From home alone  Prior level of function: Independent  Prior/Current Home Services No current home services  Social Drivers of Health Review SDOH reviewed no interventions necessary  Readmission risk has been reviewed Yes  Transition of care needs no transition of care needs at this time   Pt lives alone c/two daughters nearby who are available to help c/transportation and errands.   Current DME: walker, rollator

## 2024-11-05 NOTE — Hospital Course (Signed)
 Amanda Stewart is a 83 y.o. female with a history of carotid artery stenosis status post TCAR, chronic atrial fibrillation, complete heart block status post pacemaker placement, diabetes mellitus type 2, chronic anemia.  Patient presented secondary to abdominal pain with initial evidence of liver lesions concerning for cancer of unknown etiology.  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  Medical oncology was consulted and after discussion with patient and family, decision was made to transition to comfort measures with enrollment with hospice at home.

## 2024-11-06 ENCOUNTER — Ambulatory Visit (HOSPITAL_COMMUNITY): Payer: Self-pay

## 2024-11-06 DIAGNOSIS — I4892 Unspecified atrial flutter: Secondary | ICD-10-CM | POA: Diagnosis present

## 2024-11-06 DIAGNOSIS — E1142 Type 2 diabetes mellitus with diabetic polyneuropathy: Secondary | ICD-10-CM | POA: Diagnosis present

## 2024-11-06 DIAGNOSIS — R110 Nausea: Secondary | ICD-10-CM

## 2024-11-06 DIAGNOSIS — R160 Hepatomegaly, not elsewhere classified: Secondary | ICD-10-CM | POA: Diagnosis present

## 2024-11-06 DIAGNOSIS — R413 Other amnesia: Secondary | ICD-10-CM | POA: Diagnosis present

## 2024-11-06 DIAGNOSIS — R12 Heartburn: Secondary | ICD-10-CM

## 2024-11-06 DIAGNOSIS — I130 Hypertensive heart and chronic kidney disease with heart failure and stage 1 through stage 4 chronic kidney disease, or unspecified chronic kidney disease: Secondary | ICD-10-CM | POA: Diagnosis present

## 2024-11-06 DIAGNOSIS — E1165 Type 2 diabetes mellitus with hyperglycemia: Secondary | ICD-10-CM | POA: Diagnosis present

## 2024-11-06 DIAGNOSIS — Z7901 Long term (current) use of anticoagulants: Secondary | ICD-10-CM | POA: Diagnosis not present

## 2024-11-06 DIAGNOSIS — K219 Gastro-esophageal reflux disease without esophagitis: Secondary | ICD-10-CM | POA: Diagnosis present

## 2024-11-06 DIAGNOSIS — D509 Iron deficiency anemia, unspecified: Secondary | ICD-10-CM | POA: Diagnosis present

## 2024-11-06 DIAGNOSIS — C189 Malignant neoplasm of colon, unspecified: Secondary | ICD-10-CM | POA: Diagnosis not present

## 2024-11-06 DIAGNOSIS — R1011 Right upper quadrant pain: Secondary | ICD-10-CM | POA: Diagnosis not present

## 2024-11-06 DIAGNOSIS — Z8673 Personal history of transient ischemic attack (TIA), and cerebral infarction without residual deficits: Secondary | ICD-10-CM | POA: Diagnosis not present

## 2024-11-06 DIAGNOSIS — E1151 Type 2 diabetes mellitus with diabetic peripheral angiopathy without gangrene: Secondary | ICD-10-CM | POA: Diagnosis present

## 2024-11-06 DIAGNOSIS — I482 Chronic atrial fibrillation, unspecified: Secondary | ICD-10-CM | POA: Diagnosis present

## 2024-11-06 DIAGNOSIS — C787 Secondary malignant neoplasm of liver and intrahepatic bile duct: Secondary | ICD-10-CM | POA: Diagnosis present

## 2024-11-06 DIAGNOSIS — I5042 Chronic combined systolic (congestive) and diastolic (congestive) heart failure: Secondary | ICD-10-CM | POA: Diagnosis present

## 2024-11-06 DIAGNOSIS — R7989 Other specified abnormal findings of blood chemistry: Secondary | ICD-10-CM | POA: Diagnosis not present

## 2024-11-06 DIAGNOSIS — C19 Malignant neoplasm of rectosigmoid junction: Secondary | ICD-10-CM | POA: Diagnosis present

## 2024-11-06 DIAGNOSIS — Z7189 Other specified counseling: Secondary | ICD-10-CM | POA: Diagnosis not present

## 2024-11-06 DIAGNOSIS — E11319 Type 2 diabetes mellitus with unspecified diabetic retinopathy without macular edema: Secondary | ICD-10-CM | POA: Diagnosis present

## 2024-11-06 DIAGNOSIS — N1831 Chronic kidney disease, stage 3a: Secondary | ICD-10-CM | POA: Diagnosis present

## 2024-11-06 DIAGNOSIS — Z66 Do not resuscitate: Secondary | ICD-10-CM | POA: Diagnosis present

## 2024-11-06 DIAGNOSIS — I442 Atrioventricular block, complete: Secondary | ICD-10-CM | POA: Diagnosis present

## 2024-11-06 DIAGNOSIS — E7849 Other hyperlipidemia: Secondary | ICD-10-CM | POA: Diagnosis present

## 2024-11-06 DIAGNOSIS — E8809 Other disorders of plasma-protein metabolism, not elsewhere classified: Secondary | ICD-10-CM | POA: Diagnosis present

## 2024-11-06 DIAGNOSIS — F0390 Unspecified dementia without behavioral disturbance: Secondary | ICD-10-CM | POA: Diagnosis present

## 2024-11-06 DIAGNOSIS — I480 Paroxysmal atrial fibrillation: Secondary | ICD-10-CM | POA: Diagnosis present

## 2024-11-06 DIAGNOSIS — Z515 Encounter for palliative care: Secondary | ICD-10-CM | POA: Diagnosis not present

## 2024-11-06 DIAGNOSIS — I951 Orthostatic hypotension: Secondary | ICD-10-CM

## 2024-11-06 DIAGNOSIS — E1122 Type 2 diabetes mellitus with diabetic chronic kidney disease: Secondary | ICD-10-CM | POA: Diagnosis present

## 2024-11-06 LAB — HEPATIC FUNCTION PANEL
ALT: 42 U/L (ref 0–44)
AST: 48 U/L — ABNORMAL HIGH (ref 15–41)
Albumin: 2.9 g/dL — ABNORMAL LOW (ref 3.5–5.0)
Alkaline Phosphatase: 520 U/L — ABNORMAL HIGH (ref 38–126)
Bilirubin, Direct: 0.3 mg/dL — ABNORMAL HIGH (ref 0.0–0.2)
Indirect Bilirubin: 0.9 mg/dL (ref 0.3–0.9)
Total Bilirubin: 1.2 mg/dL (ref 0.0–1.2)
Total Protein: 5.9 g/dL — ABNORMAL LOW (ref 6.5–8.1)

## 2024-11-06 LAB — GLUCOSE, CAPILLARY
Glucose-Capillary: 157 mg/dL — ABNORMAL HIGH (ref 70–99)
Glucose-Capillary: 161 mg/dL — ABNORMAL HIGH (ref 70–99)
Glucose-Capillary: 166 mg/dL — ABNORMAL HIGH (ref 70–99)
Glucose-Capillary: 172 mg/dL — ABNORMAL HIGH (ref 70–99)
Glucose-Capillary: 233 mg/dL — ABNORMAL HIGH (ref 70–99)

## 2024-11-06 LAB — AFP TUMOR MARKER: AFP, Serum, Tumor Marker: <1.8 ng/mL (ref 0.0–8.7)

## 2024-11-06 LAB — CBC
HCT: 25.8 % — ABNORMAL LOW (ref 36.0–46.0)
Hemoglobin: 7.5 g/dL — ABNORMAL LOW (ref 12.0–15.0)
MCH: 24.5 pg — ABNORMAL LOW (ref 26.0–34.0)
MCHC: 29.1 g/dL — ABNORMAL LOW (ref 30.0–36.0)
MCV: 84.3 fL (ref 80.0–100.0)
Platelets: 256 K/uL (ref 150–400)
RBC: 3.06 MIL/uL — ABNORMAL LOW (ref 3.87–5.11)
RDW: 19.1 % — ABNORMAL HIGH (ref 11.5–15.5)
WBC: 7.7 K/uL (ref 4.0–10.5)
nRBC: 0 % (ref 0.0–0.2)

## 2024-11-06 LAB — APTT
aPTT: >200 s (ref 24–36)
aPTT: 160 s — ABNORMAL HIGH (ref 24–36)

## 2024-11-06 LAB — BASIC METABOLIC PANEL WITH GFR
Anion gap: 13 (ref 5–15)
BUN: 17 mg/dL (ref 8–23)
CO2: 26 mmol/L (ref 22–32)
Calcium: 8.7 mg/dL — ABNORMAL LOW (ref 8.9–10.3)
Chloride: 100 mmol/L (ref 98–111)
Creatinine, Ser: 1.01 mg/dL — ABNORMAL HIGH (ref 0.44–1.00)
GFR, Estimated: 55 mL/min — ABNORMAL LOW (ref >60)
Glucose, Bld: 187 mg/dL — ABNORMAL HIGH (ref 70–99)
Potassium: 4.7 mmol/L (ref 3.5–5.1)
Sodium: 139 mmol/L (ref 135–145)

## 2024-11-06 LAB — HEPATITIS PANEL, ACUTE
HCV Ab: NONREACTIVE
Hep A IgM: NONREACTIVE
Hep B C IgM: NONREACTIVE
Hepatitis B Surface Ag: NONREACTIVE

## 2024-11-06 LAB — HEPARIN LEVEL (UNFRACTIONATED)
Heparin Unfractionated: >1.1 [IU]/mL — ABNORMAL HIGH (ref 0.30–0.70)
Heparin Unfractionated: >1.1 [IU]/mL — ABNORMAL HIGH (ref 0.30–0.70)

## 2024-11-06 MED ORDER — HEPARIN (PORCINE) 25000 UT/250ML-% IV SOLN
550.0000 [IU]/h | INTRAVENOUS | Status: DC
  Administered 2024-11-06 (×2): 550 [IU]/h via INTRAVENOUS
  Filled 2024-11-06: qty 250

## 2024-11-06 MED ORDER — LACTATED RINGERS IV SOLN: INTRAVENOUS | Status: AC

## 2024-11-06 MED ORDER — ONDANSETRON 4 MG PO TBDP: 4.0000 mg | ORAL_TABLET | Freq: Three times a day (TID) | ORAL | Status: DC | PRN

## 2024-11-06 MED ORDER — ALUM & MAG HYDROXIDE-SIMETH 200-200-20 MG/5ML PO SUSP
15.0000 mL | Freq: Four times a day (QID) | ORAL | Status: DC | PRN
  Administered 2024-11-08: 15 mL via ORAL
  Filled 2024-11-06: qty 30

## 2024-11-06 MED ORDER — ONDANSETRON HCL 4 MG/2ML IJ SOLN
4.0000 mg | Freq: Three times a day (TID) | INTRAMUSCULAR | Status: DC | PRN
  Administered 2024-11-12: 14:00:00 4 mg via INTRAVENOUS
  Filled 2024-11-06: qty 2

## 2024-11-06 NOTE — Plan of Care (Signed)

## 2024-11-06 NOTE — TOC Progression Note (Addendum)
 Transition of Care [facility]) - Progression Note    Patient Details  Name: Amanda Stewart MRN: [id number] Date of Birth: 1941-07-04  [facility] CM/SW Contact  Rosaline JONELLE Joe, RN Phone Number: 11/06/2024, 11:42 AM  Clinical Narrative:    CM met with the patient at the bedside to discuss IP Care management needs for likely SNF placement.  The patient was seen by OT and is pending PT evaluation at this time.  The patient lives alone and her daughter, Alfonso assists with her care 2 hours every day.  DME at the home includes Rolator, RW, [place], and shower seat.  I spoke with the patient at the bedside and patient is agreeable for SNF placement - pending PT evaluation.  MSW to follow up once PT sees the patient, FL2 is completed and bed offers are available.  11/06/2024 1401 - FL2 completed and patient faxed out in the hub for bed offers.  MSW to follow up with the patient/daughter regarding bed offer choice once available.                     Expected Discharge Plan and Services                                               Social Drivers of Health (SDOH) Interventions SDOH Screenings   Food Insecurity: No Food Insecurity (11/05/2024)  Housing: Unknown (11/05/2024)  Transportation Needs: No Transportation Needs (11/05/2024)  Utilities: Not At Risk (11/05/2024)  Social Connections: Unknown (11/05/2024)  Tobacco Use: Low Risk (11/04/2024)    Readmission Risk Interventions     No data to display

## 2024-11-06 NOTE — Progress Notes (Signed)
 PROGRESS NOTE    Amanda Stewart  FMW:[id number] DOB: 1941/05/25 DOA: 11/04/2024 PCP: Amanda Atlas, MD  Subjective:  No acute events overnight. Seen and examined at bedside. Reports still having heartburn but nausea is better. Denies vomiting or constipation.   Hospital Course: As per H&P: 83 y.o. female with history of stroke with carotid stenosis status post TCAR, chronic atrial fibrillation, complete heart block status post pacemaker placement, diabetes mellitus type 2, chronic anemia brought to the ER after patient was complaining of abdominal pain.  As per patient's daughter with whom I spoke over the telephone patient has been complaining of epigastric discomfort with poor appetite for the last 2 to 3 weeks.  Did not have any nausea vomiting or diarrhea fever or chills.  Patient's amlodipine  was recently discontinued due to low blood pressure.  Gabapentin also was discontinued recently.    Assessment and Plan: No notes have been filed under this hospital service. Service: Hospitalist    Liver lesions Possible colon cancer Etiology unclear, concern for metastatic disease Viral hepatitis panel pending AFP not elevated MRI abdomen showed diffuse liver metastases, short segment annular wall thickening in proximal ascending colon, highly suspicious for primary colon carcinoma, enlarged R pericolonic lymph nodes Plan for IR liver biopsy on 12/12   Orthostatic hypotension Drop in diastolic BP > 10 on standing Will start IV fluids and continue until taking PO well Encourage oral intake/hydration Will repeat orthostatic vitals daily  Heartburn/Nausea Etiology unclear,  maybe related to liver lesions, dyspepsia Cont pantoprazole  daily Start maalox PRN Start zofran  PRN Encourage oral intake/hydration  Lung lesion MRI abdomen showed new ill-defined T2 hyperintensity in posterior L lower lobe CT chest with no definitive metastatic disease Concern for acute infection low as no  respiratory symptoms Monitor clinically   Atrial fibrillation Complete heart block status post pacemaker Cont metoprolol   Hold home eliquis  for planned IR procedure Cont heparin  drip given CVA roughly 1 year ago   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 Hold home eliquis  as elsewhere Cont heparin  drip  Hold home statin given elevated LFTs   Chronic anemia Hgb 8.1 < 8.9, baseline 7-8 Anticoagulation as elsewhere Monitor CBC   T2DM HgbA1c 10.1 Cont SSI with FS ACHS   CKD 3 Cr 1.01 < 1.21, baseline 0.9-1.1 Monitor clinically   HTN Cont metoprolol    Possible cognitive impairment As per daughter, some concerns of memory loss more recently May need outpatient neuropsychological testing for dementia   DVT prophylaxis: Place and maintain sequential compression device Start: 11/05/24 0410  IV Heparin    Code Status: Limited: Do not attempt resuscitation (DNR) -DNR-LIMITED -Do Not Intubate/DNI  Family Communication: Spoke with daughter on phone and updated on treatment plan Disposition Plan: TBD pending clinical course Reason for continuing need for hospitalization: severity of illness, needs IR biopsy  Objective: Vitals:   11/06/24 0028 11/06/24 0506 11/06/24 0920 11/06/24 1142  BP: (!) 155/67 (!) 168/70 (!) 164/78 (!) 141/70  Pulse: 70 70 70 69  Resp: 18 18 18 18   Temp: 97.9 F (36.6 C) 97.8 F (36.6 C) 97.7 F (36.5 C) 97.9 F (36.6 C)  TempSrc:   Oral   SpO2: 98% 96% 98% 100%  Weight:      Height:        Intake/Output Summary (Last 24 hours) at 11/06/2024 1233 Last data filed at 11/06/2024 0434 Gross per 24 hour  Intake 280.01 ml  Output --  Net 280.01 ml   [employer]  11/05/24 0500  Weight: 71.8 kg    Examination:  Physical Exam Vitals and nursing note reviewed.  Constitutional:      General: She is not in acute distress.    Appearance: She is ill-appearing.     Comments: Weak, frail  HENT:     Head: Normocephalic and  atraumatic.  Cardiovascular:     Rate and Rhythm: Normal rate and regular rhythm.     Pulses: Normal pulses.     Heart sounds: Normal heart sounds.  Pulmonary:     Effort: Pulmonary effort is normal.     Breath sounds: Normal breath sounds.  Abdominal:     General: Bowel sounds are normal.     Palpations: Abdomen is soft.  Neurological:     Mental Status: She is alert. Mental status is at baseline.     Data Reviewed: I have personally reviewed following labs and imaging studies  CBC: Recent Labs  Lab 11/04/24 1542 11/05/24 0152  WBC 8.4 8.4  NEUTROABS  --  6.0  HGB 8.9* 8.1*  HCT 30.0* 27.6*  MCV 86.2 84.7  PLT 302 261   Basic Metabolic Panel: Recent Labs  Lab 11/04/24 1542 11/05/24 0152 11/06/24 0716  NA 139 138 139  K 4.0 3.0* 4.7  CL 102 101 100  CO2 25 27 26   GLUCOSE 286* 207* 187*  BUN 19 17 17   CREATININE 1.17* 1.21* 1.01*  CALCIUM  7.9* 8.3* 8.7*   GFR: Estimated Creatinine Clearance: 39.5 mL/min (A) (by C-G formula based on SCr of 1.01 mg/dL (H)). Liver Function Tests: Recent Labs  Lab 11/04/24 1542 11/05/24 0152 11/06/24 0716  AST 63* 53* 48*  ALT 39 40 42  ALKPHOS 467* 485* 520*  BILITOT 2.1* 0.9 1.2  PROT 5.6* 5.9* 5.9*  ALBUMIN 2.9* 2.9* 2.9*   Recent Labs  Lab 11/04/24 1542  LIPASE 35   No results for input(s): AMMONIA in the last 168 hours. Coagulation Profile: No results for input(s): INR, PROTIME in the last 168 hours. Cardiac Enzymes: No results for input(s): CKTOTAL, CKMB, CKMBINDEX, TROPONINI in the last 168 hours. ProBNP, BNP (last 5 results) No results for input(s): PROBNP, BNP in the last 8760 hours. HbA1C: Recent Labs    11/05/24 0152  HGBA1C 10.2*   CBG: Recent Labs  Lab 11/05/24 1659 11/05/24 2031 11/06/24 0029 11/06/24 0916 11/06/24 1145  GLUCAP 163* 211* 166* 233* 161*   Lipid Profile: No results for input(s): CHOL, HDL, LDLCALC, TRIG, CHOLHDL, LDLDIRECT in the last 72  hours. Thyroid Function Tests: No results for input(s): TSH, T4TOTAL, FREET4, T3FREE, THYROIDAB in the last 72 hours. Anemia Panel: No results for input(s): VITAMINB12, FOLATE, FERRITIN, TIBC, IRON, RETICCTPCT in the last 72 hours. Sepsis Labs: No results for input(s): PROCALCITON, LATICACIDVEN in the last 168 hours.  No results found for this or any previous visit (from the past 240 hours).   Radiology Studies: CT CHEST WO CONTRAST Result Date: 11/05/2024 EXAM: CT CHEST WITHOUT CONTRAST 11/05/2024 12:37:11 PM TECHNIQUE: CT of the chest was performed without the administration of intravenous contrast. Multiplanar reformatted images are provided for review. Automated exposure control, iterative reconstruction, and/or weight based adjustment of the mA/kV was utilized to reduce the radiation dose to as low as reasonably achievable. COMPARISON: CT of the abdomen and pelvis dated 11/04/2024. CLINICAL HISTORY: Metastasis evaluation, Patient with liver lesions/metastases and possible colon cancer, evaluate Left lung lesion/hyperdensity. FINDINGS: MEDIASTINUM: Heart: Normal in size. Moderate calcific coronary artery disease. A cardiac pacemaker is present. Pericardium  is unremarkable. The central airways are clear. The thoracic aorta demonstrates moderate calcific atheromatous disease. There is a vascular stent within the right common carotid artery. LYMPH NODES: No mediastinal, hilar or axillary lymphadenopathy. LUNGS AND PLEURA: There is a patchy area of ground-glass and subsolid opacities present posteromedially within the left lower lobe on images 80 through 88 of series 3, which appeared most likely to be infectious or inflammatory. There are streaky linear opacities present medially within the right lower lobe, likely representing compressive atelectasis from the thoracic osteophytes. There is minimal dependent atelectasis. The lungs are clear otherwise. No pleural effusion or  pneumothorax. SOFT TISSUES/BONES: No acute abnormality of the bones or soft tissues. There are no osseous lesions concerning for metastatic disease. UPPER ABDOMEN: Numerous lesions again demonstrated within the liver highly concerning for hepatic metastatic disease. Limited images of the upper abdomen demonstrates no acute abnormality. IMPRESSION: 1. Patchy ground-glass and subsolid opacities in the left lower lobe, likely infectious or inflammatory. Attention on follow up is suggested. 2. Streaky linear opacities in the right lower lobe, likely compressive atelectasis from thoracic osteophytes. 3. Numerous liver lesions highly concerning for hepatic metastatic disease. 4. No thoracic lymphadenopathy or osseous lesions to suggest metastatic disease; no convincing evidence of thoracic metastatic disease. Electronically signed by: Evalene Coho MD 11/05/2024 01:04 PM EST RP Workstation: [id number]   MR LIVER W WO CONTRAST Result Date: 11/05/2024 CLINICAL DATA:  Abdominal pain. Elevated bilirubin. Hepatic masses on recent CT. EXAM: MRI ABDOMEN WITHOUT AND WITH CONTRAST TECHNIQUE: Multiplanar multisequence MR imaging of the abdomen was performed both before and after the administration of intravenous contrast. CONTRAST:  7mL GADAVIST GADOBUTROL 1 MMOL/ML IV SOLN COMPARISON:  CT on 11/04/2024 FINDINGS: Lower chest: Focal ill-defined T2 hyperintensity is seen in the posterior left lower lobe which is new since recent CT, suspicious for infectious or inflammatory etiology. Hepatobiliary: Numerous heterogeneous hypovascular masses are seen throughout the liver, consistent with diffuse liver metastases. Largest index mass in the lateral right hepatic lobe measures 5.2 x 4.1 cm. T1 hyperintense gallbladder sludge is seen, without signs of cholecystitis. No evidence of biliary ductal dilatation. Pancreas:  No mass or inflammatory changes. Spleen:  Within normal limits in size and appearance. Adrenals/Urinary Tract: Tiny  benign-appearing Bosniak category 2 left renal cyst noted (No followup imaging is recommended) . No suspicious masses identified. No evidence of hydronephrosis. Stomach/Bowel: A short segment area of annular wall thickening is seen in the proximal ascending colon measuring approximately 5 cm in length, highly suspicious for primary colon carcinoma. No evidence of bowel obstruction. Vascular/Lymphatic: Several borderline enlarged right pericolonic lymph nodes are also seen, suspicious for local lymph node metastases. No other sites of lymphadenopathy identified. No acute vascular findings. Other:  None. Musculoskeletal:  No suspicious bone lesions identified. IMPRESSION: Diffuse liver metastases. Short segment annular wall thickening in the proximal ascending colon, highly suspicious for primary colon carcinoma. Several borderline enlarged right pericolonic lymph nodes, suspicious for local lymph node metastases. Focal ill-defined T2 hyperintensity in the posterior left lower lobe, new since recent CT, suspicious for pneumonia or other inflammatory etiology. Consider further evaluation with chest radiographs or chest CT. Electronically Signed   By: Norleen DELENA Kil M.D.   On: 11/05/2024 11:11   CT ABDOMEN PELVIS W CONTRAST Result Date: 11/04/2024 EXAM: CT ABDOMEN AND PELVIS WITH CONTRAST 11/04/2024 08:23:52 PM TECHNIQUE: CT of the abdomen and pelvis was performed with the administration of intravenous contrast (75 mL iohexol  (OMNIPAQUE ) 350 MG/ML injection). Multiplanar reformatted images are provided  for review. Automated exposure control, iterative reconstruction, and/or weight-based adjustment of the mA/kV was utilized to reduce the radiation dose to as low as reasonably achievable. COMPARISON: None available. CLINICAL HISTORY: abd pain, elevated bilirubin, vomiting FINDINGS: LOWER CHEST: Aortic valve leaflet calcification. Partially visualized cardiac implantable electronic device (defibrillator). LIVER: Multiple  lobulated fluid density lesions within the liver that appears slightly heterogeneous centrally with some vascularity running through the lesions. GALLBLADDER AND BILE DUCTS: Gallbladder is unremarkable. No biliary ductal dilatation. SPLEEN: Multiple subcentimeter splenules noted. The spleen is unremarkable. PANCREAS: No acute abnormality. ADRENAL GLANDS: Bilateral adrenal hyperplasia with no definite nodularity. KIDNEYS, URETERS AND BLADDER: No stones in the kidneys or ureters. No hydronephrosis. No perinephric or periureteral stranding. Urinary bladder is unremarkable. GI AND BOWEL: Small hiatal hernia. No small or large bowel thickening or dilatation. The appendix is unremarkable. PERITONEUM AND RETROPERITONEUM: No ascites. No free air. VASCULATURE: Severe atherosclerotic plaque of the aorta. LYMPH NODES: No lymphadenopathy. REPRODUCTIVE ORGANS: No acute abnormality. BONES AND SOFT TISSUES: Multilevel degenerative changes of the spine. No acute osseous abnormality. No focal soft tissue abnormality. IMPRESSION: 1. No acute findings in the abdomen or pelvis. 2. Multiple lobulated fluid-density hepatic lesions, slightly heterogeneous centrally with internal vascularity; recommend dedicated liver MRI with contrast for characterization. 3. Other, non-acute and/or normal findings as above. Electronically signed by: Kate Plummer MD 11/04/2024 08:34 PM EST RP Workstation: [id number]   US  Abdomen Limited Result Date: 11/04/2024 EXAM: Right Upper Quadrant Abdominal Ultrasound 11/04/2024 07:37:00 PM TECHNIQUE: Real-time ultrasonography of the right upper quadrant of the abdomen was performed. COMPARISON: None available. CLINICAL HISTORY: RUQ US  RUQ US  FINDINGS: LIVER: The liver demonstrates normal echogenicity. No intrahepatic biliary ductal dilatation. Multiple hypoechoic lesions noted in the liver, particularly the left hepatic lobe measuring up to 2.4 cm. These appear to reflect solid mass lesions. BILIARY SYSTEM: No  pericholecystic fluid or wall thickening. No cholelithiasis. Common bile duct is within normal limits measuring 4 mm. OTHER: No right upper quadrant ascites. IMPRESSION: 1. Multiple hypoechoic solid liver masses, up to 2.4 cm, suspicious for hepatic neoplasm; recommend dedicated multiphasic liver MRI or contrast-enhanced CT for further characterization. Electronically signed by: Franky Crease MD 11/04/2024 07:40 PM EST RP Workstation: [id number]   DG Chest 1 View Result Date: 11/04/2024 EXAM: 1 VIEW(S) XRAY OF THE CHEST 11/04/2024 06:13:00 PM COMPARISON: 11/10/2023. CLINICAL HISTORY: cough FINDINGS: LUNGS AND PLEURA: No focal pulmonary opacity. No pleural effusion. No pneumothorax. HEART AND MEDIASTINUM: Left pacer remains in place, unchanged. BONES AND SOFT TISSUES: No acute osseous abnormality. IMPRESSION: 1. No acute cardiopulmonary process. 2. Left pacer in place without interval change. Electronically signed by: Franky Crease MD 11/04/2024 06:34 PM EST RP Workstation: [id number]    Scheduled Meds:  insulin  aspart  0-6 Units Subcutaneous TID WC   metoprolol  tartrate  25 mg Oral BID   pantoprazole   40 mg Oral Q0600   Continuous Infusions:  heparin  550 Units/hr (11/06/24 0916)   lactated ringers        LOS: 0 days   Norval Bar, MD  [facility]  11/06/2024, 12:33 PM

## 2024-11-06 NOTE — Progress Notes (Signed)
 PHARMACY - ANTICOAGULATION CONSULT NOTE  Pharmacy Consult for heparin  Indication: atrial fibrillation, hx of CVA  Allergies  Allergen Reactions   Metformin  And Related Other (See Comments)    Diarrhea     Patient Measurements: Height: 5' 4 (162.6 cm) Weight: 71.8 kg (158 lb 4.6 oz) (Wt from 07/17/2024) IBW/kg (Calculated) : 54.7 HEPARIN  DW (KG): 69.4  Vital Signs: Temp: 97.8 F (36.6 C) (12/11 0506) BP: 168/70 (12/11 0506) Pulse Rate: 70 (12/11 0506)  Labs: Recent Labs    11/04/24 1542 11/05/24 0152 11/05/24 1443 11/05/24 1638 11/06/24 0606  HGB 8.9* 8.1*  --   --   --   HCT 30.0* 27.6*  --   --   --   PLT 302 261  --   --   --   APTT  --   --  >200* >200*  --   HEPARINUNFRC  --   --  >1.10* >1.10* >1.10*  CREATININE 1.17* 1.21*  --   --   --     Estimated Creatinine Clearance: 33 mL/min (A) (by C-G formula based on SCr of 1.21 mg/dL (H)).   Medical History: Past Medical History:  Diagnosis Date   Allergic rhinitis    Anemia    Arrhythmia    CAD (coronary artery disease)    Chest pain    Chronic kidney disease, stage 3a (HCC) 08/11/2023   Combined systolic and diastolic heart failure (HCC)    Diabetes mellitus without complication (HCC)    Diabetic peripheral neuropathy (HCC)    GERD (gastroesophageal reflux disease)    Heart block    Hyperlipidemia    Hypertension    Left bundle branch block    Low back pain    Myocardial infarction (HCC)    Peripheral arterial disease    Retinopathy    Assessment: 47 yoF presented to ED with abdominal pain found to have liver lesion s/p elevated LFTs. Pharmacy consulted to dose heparin  for afib while eliquis  on hold. 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, CHB s/p PPM, CAD, PAD, T2DM, HTN, HLD, afib (on eliquis ) -Last dose of eliquis  5mg  PO BID: 12/9 AM -Hgb 8s (~8 since 10/2023), plts 261 -Previously on triple therapy, but given hx of anemia plavix  d/c'd and takes ASA/eliquis   12/11: Heparin  level >1.1  (on Eliquis ), aPTT >200s . This has happened x 3. Previously thought to be because the level was drawn from the same side as heparin  was running, but previous levels drawn appropriately. RN not positive drawn from opposite side in this instance (pt has bandages on both arms and pt unable to verify). Based on this same situation happening previously with levels having been drawn appropriately, will treat as a real level. No bleeding noted.  Goal of Therapy:  Heparin  level 0.3-0.7 units/ml aPTT 66-102 seconds Monitor platelets by anticoagulation protocol: Yes   Plan:  Hold heparin  x 1 hour Restart at 550 units/hr Will f/u 8 hr aPTT,HL,CBC post restart  Massie Fila, PharmD Clinical Pharmacist  11/06/2024 7:35 AM

## 2024-11-06 NOTE — Progress Notes (Signed)
°   11/06/24 0754  Provider Notification  Provider Name/Title Dr. Cosette  Date Provider Notified 11/06/24  Time Provider Notified [phone]  Method of Notification Page [email or web address])  Notification Reason Critical Result  Test performed and critical result PTT >200  Date Critical Result Received 11/06/24  Time Critical Result Received 0751  Provider response See new orders  Date of Provider Response 11/06/24  Time of Provider Response [phone]   Per Dr. Cosette, pause heparin  at this time and notify pharmacy for next steps. Massie Fila, [facility] notified.

## 2024-11-06 NOTE — Progress Notes (Signed)
 Overnight cross coverage  Pharmacist is asking if it is okay to hold heparin  drip until after liver biopsy in the morning as patient's hemoglobin has been gradually dropping.  It was 8.9 on labs 2 days ago and 7.5 on repeat labs tonight.  No overt bleeding reported by nursing staff.  Patient is on anticoagulation due to history of A-fib and prior CVA in July 2024.  Given worsening anemia, plan is to hold heparin  drip for now and pharmacy to follow-up with day team to see when it is safe to resume anticoagulation depending on repeat labs in the morning.

## 2024-11-06 NOTE — NC FL2 (Signed)
 [place]  MEDICAID FL2 LEVEL OF CARE FORM     IDENTIFICATION  Patient Name: Amanda Stewart Birthdate: 1940-12-13 Sex: female Admission Date (Current Location): 11/04/2024  [facility] and [place] Number:  Producer, Television/film/video and Address:  [facility], [address], [place], [place] [address]      Provider Number: [id number]  Attending Physician Name and Address:  Cosette Blackwater, MD  Relative Name and Phone Number:       Current Level of Care: Hospital Recommended Level of Care: Skilled Nursing Facility Prior Approval Number:    Date Approved/Denied:   PASRR Number: [id number]  Discharge Plan: SNF    Current Diagnoses: Patient Active Problem List   Diagnosis Date Noted   Liver mass 11/04/2024   Elevated LFTs 11/04/2024   Abdominal pain 11/04/2024   Acute blood loss anemia 11/11/2023   Increased anion gap metabolic acidosis 11/11/2023   AKI (acute kidney injury) 11/10/2023   Paroxysmal atrial fibrillation (HCC) 10/05/2023   Hypercoagulable state due to paroxysmal atrial fibrillation (HCC) 10/05/2023   Stroke (HCC) 08/29/2023   Physical deconditioning 08/12/2023   Hypotension 08/11/2023   History of COVID-19 08/11/2023   Chronic kidney disease, stage 3a (HCC) 08/11/2023   Hypertension associated with diabetes (HCC) 08/11/2023   Hyperlipidemia associated with type 2 diabetes mellitus (HCC) 08/11/2023   PAD (peripheral artery disease) 08/11/2023   Pacemaker 11/23/2021   Complete heart block (HCC) 08/07/2021   Hyperkalemia 11/04/2015   Rhabdomyolysis 11/04/2015   CAD (coronary artery disease) 11/04/2015   Transaminitis 11/04/2015   Diabetes mellitus, type 2 (HCC) 11/04/2015   Chest pain 12/10/2014   Chest pain at rest 12/10/2014   Cardiac arrest (HCC) 05/24/2014   Acute respiratory failure with hypoxia (HCC) 05/24/2014   Acute renal insufficiency 05/24/2014    Orientation RESPIRATION BLADDER Height & Weight     Self, Time, Situation,  Place  Normal Continent Weight: 71.8 kg (Wt from 07/17/2024) Height:  5' 4 (162.6 cm)  BEHAVIORAL SYMPTOMS/MOOD NEUROLOGICAL BOWEL NUTRITION STATUS      Continent Diet  AMBULATORY STATUS COMMUNICATION OF NEEDS Skin   Extensive Assist Verbally Normal                       Personal Care Assistance Level of Assistance  Bathing, Feeding, Dressing Bathing Assistance: Limited assistance Feeding assistance: Independent Dressing Assistance: Limited assistance     Functional Limitations Info  Sight, Hearing, Speech Sight Info: Impaired (wears glasses) Hearing Info: Impaired Speech Info: Adequate    SPECIAL CARE FACTORS FREQUENCY  PT (By licensed PT), OT (By licensed OT)     PT Frequency: 5 x per week OT Frequency: 5 x per week            Contractures Contractures Info: Not present    Additional Factors Info  Code Status, Allergies, Insulin  Sliding Scale Code Status Info: DNR Allergies Info: Metformin    Insulin  Sliding Scale Info: Novolog  very sensitive sliding scale 3 x per day with meals       Current Medications (11/06/2024):  This is the current hospital active medication list Current Facility-Administered Medications  Medication Dose Route Frequency Provider Last Rate Last Admin   alum & mag hydroxide-simeth (MAALOX/MYLANTA) 200-200-20 MG/5ML suspension 15 mL  15 mL Oral Q6H PRN Cosette Blackwater, MD       heparin  ADULT infusion 100 units/mL (25000 units/250mL)  550 Units/hr Intravenous Continuous Paytes, Austin A, RPH 5.5 mL/hr at 11/06/24 0916 550 Units/hr at  11/06/24 0916   insulin  aspart (novoLOG ) injection 0-6 Units  0-6 Units Subcutaneous TID WC Kakrakandy, Arshad N, MD   1 Units at 11/06/24 1227   lactated ringers  infusion   Intravenous Continuous Cosette Blackwater, MD 75 mL/hr at 11/06/24 1302 New Bag at 11/06/24 1302   metoprolol  tartrate (LOPRESSOR ) tablet 25 mg  25 mg Oral BID Franky Redia SAILOR, MD   25 mg at 11/06/24 9082   ondansetron  (ZOFRAN -ODT)  disintegrating tablet 4 mg  4 mg Oral Q8H PRN Cosette Blackwater, MD       Or   ondansetron  (ZOFRAN ) injection 4 mg  4 mg Intravenous Q8H PRN Cosette Blackwater, MD       pantoprazole  (PROTONIX ) EC tablet 40 mg  40 mg Oral Q0600 Kakrakandy, Arshad N, MD   40 mg at 11/06/24 9362     Discharge Medications: Please see discharge summary for a list of discharge medications.  Relevant Imaging Results:  Relevant Lab Results:   Additional Information SSN: [id number]  Rosaline JONELLE Joe, RN

## 2024-11-06 NOTE — Evaluation (Signed)
 Occupational Therapy Evaluation Patient Details Name: Amanda Stewart MRN: [id number] DOB: 1941/08/17 Today's Date: 11/06/2024   History of Present Illness   Pt is 83 yo presenting to [facility] due to abdominal pain. MRI with concerns for metastatic disease per MD note on 12/10. PMH: CVA, carotid stenosis s/p TCAR, chronic a-fib, complete heart block s/p pacemaker placement, DM II, chronic anemia.     Clinical Impressions Pt c/o pain in her mouth/jaw, unable to describe or isolate where pain was. Pt lives alone, reports daughter comes by for 2 hours each day to assist, uses rollator to get around, has decreased activity tolerance at baseline. Pt reports several falls but is unable to give a timeline on when they occurred. Pt is A/O x4, but reports she has memory issues. Pt currently likely close to baseline, overall CGA with cues for safety for ADLs and mobility with RW for support. Pt ambulated 20 feet and needed a rest break, will have difficulty getting around home as needed, making meals, using restroom, etc. Pt would benefit from postacute rehab <3hrs/day to maximize functional strength and return to mod I level, if daughter can provide support during the day for Pt, Pt could progress to HHOT. Will continue to see acutely to progress as able.      If plan is discharge home, recommend the following:   A little help with walking and/or transfers;A little help with bathing/dressing/bathroom;Assistance with cooking/housework;Assist for transportation     Functional Status Assessment   Patient has had a recent decline in their functional status and demonstrates the ability to make significant improvements in function in a reasonable and predictable amount of time.     Equipment Recommendations   Other (comment) (defer)     Recommendations for Other Services         Precautions/Restrictions   Precautions Precautions: Fall Recall of Precautions/Restrictions:  Impaired Restrictions Weight Bearing Restrictions Per Provider Order: No     Mobility Bed Mobility Overal bed mobility: Needs Assistance Bed Mobility: Supine to Sit, Sit to Supine     Supine to sit: Supervision Sit to supine: Supervision   General bed mobility comments: supervision for safety    Transfers Overall transfer level: Needs assistance Equipment used: Rolling walker (2 wheels) Transfers: Sit to/from Stand Sit to Stand: Supervision           General transfer comment: Pt overall supervision with cues for hand placement. Pt attempted to stand while holding onto RW, unable to, cues to push from bed.      Balance Overall balance assessment: Needs assistance Sitting-balance support: No upper extremity supported, Feet supported Sitting balance-Leahy Scale: Good     Standing balance support: Bilateral upper extremity supported, During functional activity Standing balance-Leahy Scale: Poor Standing balance comment: reliant on RW for support                           ADL either performed or assessed with clinical judgement   ADL Overall ADL's : Needs assistance/impaired                                       General ADL Comments: overall set up/CGA, but with noticable decreased activity tolerance. Typically uses rollator around apartment and takes frequent breaks     Vision Baseline Vision/History: 0 No visual deficits Ability to See in Adequate Light: 0 Adequate Patient  Visual Report: No change from baseline       Perception         Praxis         Pertinent Vitals/Pain       Extremity/Trunk Assessment             Communication Communication Communication: Impaired Factors Affecting Communication: Hearing impaired   Cognition Arousal: Alert Behavior During Therapy: WFL for tasks assessed/performed Cognition: No family/caregiver present to determine baseline             OT - Cognition Comments: grossly  WFLs, HOH, reports she is aware her memory isn't the best, but answered all questions without inconsistencies.                 Following commands: Intact       Cueing  General Comments   Cueing Techniques: Verbal cues;Gestural cues;Tactile cues;Visual cues      Exercises     Shoulder Instructions      Home Living Family/patient expects to be discharged to:: Private residence Living Arrangements: Alone Available Help at Discharge: Family;Available PRN/intermittently Type of Home: Apartment Home Access: Elevator     Home Layout: One level     Bathroom Shower/Tub: Tub/shower unit         Home Equipment: Shower seat;Cane - single point;Rolling Environmental Consultant (2 wheels);Rollator (4 wheels)   Additional Comments: Pt lives alone, daughter stops by 2 hours every day      Prior Functioning/Environment Prior Level of Function : Independent/Modified Independent             Mobility Comments: mod I, rollator, reports 9 falls in the last few months ADLs Comments: mod I    OT Problem List: Decreased strength;Decreased range of motion;Decreased activity tolerance;Impaired balance (sitting and/or standing);Decreased cognition;Decreased safety awareness   OT Treatment/Interventions: Self-care/ADL training;Therapeutic exercise;Energy conservation;DME and/or AE instruction;Therapeutic activities;Patient/family education;Balance training      OT Goals(Current goals can be found in the care plan section)   Acute Rehab OT Goals Patient Stated Goal: to improve activity tolerance OT Goal Formulation: With patient Time For Goal Achievement: 11/20/24 Potential to Achieve Goals: Fair   OT Frequency:  Min 2X/week    Co-evaluation              AM-PAC OT 6 Clicks Daily Activity     Outcome Measure Help from another person eating meals?: None Help from another person taking care of personal grooming?: A Little Help from another person toileting, which includes using  toliet, bedpan, or urinal?: A Little Help from another person bathing (including washing, rinsing, drying)?: A Little Help from another person to put on and taking off regular upper body clothing?: A Little Help from another person to put on and taking off regular lower body clothing?: A Little 6 Click Score: 19   End of Session Equipment Utilized During Treatment: Gait belt;Rolling walker (2 wheels) Nurse Communication: Mobility status  Activity Tolerance: Patient tolerated treatment well Patient left: in bed;with call bell/phone within reach;with bed alarm set  OT Visit Diagnosis: Unsteadiness on feet (R26.81);Other abnormalities of gait and mobility (R26.89);Muscle weakness (generalized) (M62.81);History of falling (Z91.81);Repeated falls (R29.6);Other symptoms and signs involving cognitive function                Time: 1030-1054 OT Time Calculation (min): 24 min Charges:  OT General Charges $OT Visit: 1 Visit OT Evaluation $OT Eval Low Complexity: 1 Low OT Treatments $Self Care/Home Management : 8-22 mins  Elouise Bott, OTR/L  Elouise JONELLE Bott 11/06/2024, 10:58 AM

## 2024-11-06 NOTE — Evaluation (Signed)
 Physical Therapy Evaluation Patient Details Name: Amanda Stewart MRN: [id number] DOB: [birth year] Today's Date: 11/06/2024  History of Present Illness  Pt is 83 yo presenting to [facility] due to abdominal pain. MRI with concerns for metastatic disease per MD note on 12/10. PMH: CVA, carotid stenosis s/p TCAR, chronic a-fib, complete heart block s/p pacemaker placement, DM II, chronic anemia.  Clinical Impression  Pt is currently presenting at supervision for bed mobility, Min A for sit to stand with RW and Min A to CGA for very short distance gait. Pt unable to stand longer than 3 min due to fatigue/dizziness. RN/MD notified about BP. Due to pt current functional status, home set up and available assistance at home recommending skilled physical therapy services < 3 hours/day in order to address strength, balance and functional mobility to decrease risk for falls, injury, immobility, skin break down and re-hospitalization.          If plan is discharge home, recommend the following: Assistance with cooking/housework;Assist for transportation;Help with stairs or ramp for entrance   Can travel by private vehicle   Yes    Equipment Recommendations Rolling walker (2 wheels);BSC/3in1     Functional Status Assessment Patient has had a recent decline in their functional status and demonstrates the ability to make significant improvements in function in a reasonable and predictable amount of time.     Precautions / Restrictions Precautions Precautions: Fall Recall of Precautions/Restrictions: Impaired Restrictions Weight Bearing Restrictions Per Provider Order: No      Mobility  Bed Mobility Overal bed mobility: Needs Assistance Bed Mobility: Supine to Sit     Supine to sit: Supervision     General bed mobility comments: supervision for safety    Transfers Overall transfer level: Needs assistance Equipment used: Rolling walker (2 wheels) Transfers: Sit to/from Stand Sit to Stand: Min  assist           General transfer comment: Min A for sit to stand, requires verbal cues for hand placement; initially trying to attempt standing by pulling up on RW 2x and unsuccessful.    Ambulation/Gait Ambulation/Gait assistance: Contact guard assist, Min assist Gait Distance (Feet): 15 Feet Assistive device: Rolling walker (2 wheels) Gait Pattern/deviations: Step-through pattern, Decreased stride length, Narrow base of support Gait velocity: decreased Gait velocity interpretation: <1.31 ft/sec, indicative of household ambulator   General Gait Details: Narrow BOS, Min A for navigation of AD> Pt reports dizziness in standing, unable to stand longer than 3 min     Balance Overall balance assessment: Needs assistance Sitting-balance support: No upper extremity supported, Feet supported Sitting balance-Leahy Scale: Good     Standing balance support: Bilateral upper extremity supported, During functional activity Standing balance-Leahy Scale: Poor Standing balance comment: reliant on RW for support         Pertinent Vitals/Pain Pain Assessment Pain Assessment: No/denies pain    Home Living Family/patient expects to be discharged to:: Private residence Living Arrangements: Alone Available Help at Discharge: Family;Available PRN/intermittently Type of Home: Apartment Home Access: Elevator       Home Layout: One level Home Equipment: Shower seat;Cane - single point;Rolling Environmental Consultant (2 wheels);Rollator (4 wheels) Additional Comments: Pt lives alone, daughter stops by 2 hours every day    Prior Function Prior Level of Function : Independent/Modified Independent             Mobility Comments: mod I, rollator, reports 9 falls in the last few months ADLs Comments: mod I     Extremity/Trunk Assessment  Upper Extremity Assessment Upper Extremity Assessment: Defer to OT evaluation    Lower Extremity Assessment Lower Extremity Assessment: Generalized weakness     Cervical / Trunk Assessment Cervical / Trunk Assessment: Normal  Communication   Communication Communication: Impaired Factors Affecting Communication: Hearing impaired    Cognition Arousal: Alert Behavior During Therapy: WFL for tasks assessed/performed   PT - Cognitive impairments: No family/caregiver present to determine baseline       PT - Cognition Comments: overall no apparent impairments. Pt knows she is at hospital, slightly confused about situation. Very HOH Following commands: Intact       Cueing Cueing Techniques: Verbal cues, Gestural cues, Tactile cues, Visual cues     General Comments General comments (skin integrity, edema, etc.): pt BP 122/86 in sitting, 121/57 in standing, 115/52 in standing after 3 min but pt unable to stand for full 3 min had to sit mid BP check        Assessment/Plan    PT Assessment Patient needs continued PT services  PT Problem List Decreased strength;Decreased activity tolerance;Decreased balance;Decreased mobility;Decreased safety awareness       PT Treatment Interventions DME instruction;Balance training;Gait training;Neuromuscular re-education;Stair training;Functional mobility training;Patient/family education;Therapeutic activities;Wheelchair mobility training;Therapeutic exercise    PT Goals (Current goals can be found in the Care Plan section)  Acute Rehab PT Goals Patient Stated Goal: to go home PT Goal Formulation: With patient Time For Goal Achievement: 11/20/24 Potential to Achieve Goals: Fair    Frequency Min 2X/week        AM-PAC PT 6 Clicks Mobility  Outcome Measure Help needed turning from your back to your side while in a flat bed without using bedrails?: A Little Help needed moving from lying on your back to sitting on the side of a flat bed without using bedrails?: A Little Help needed moving to and from a bed to a chair (including a wheelchair)?: A Little Help needed standing up from a chair using  your arms (e.g., wheelchair or bedside chair)?: A Little Help needed to walk in hospital room?: A Lot Help needed climbing 3-5 steps with a railing? : Total 6 Click Score: 15    End of Session Equipment Utilized During Treatment: Gait belt Activity Tolerance: Patient tolerated treatment well;Other (comment) (limited by dizziness/ fatigue.)     PT Visit Diagnosis: Unsteadiness on feet (R26.81);Other abnormalities of gait and mobility (R26.89);Muscle weakness (generalized) (M62.81);Repeated falls (R29.6)    Time: 8859-8793 PT Time Calculation (min) (ACUTE ONLY): 26 min   Charges:   PT Evaluation $PT Eval Low Complexity: 1 Low PT Treatments $Therapeutic Activity: 8-22 mins PT General Charges $$ ACUTE PT VISIT: 1 Visit       Dorothyann Maier, DPT, CLT  Acute Rehabilitation Services Office: [phone] (Secure chat preferred)   Dorothyann VEAR Maier 11/06/2024, 12:12 PM

## 2024-11-07 ENCOUNTER — Inpatient Hospital Stay (HOSPITAL_COMMUNITY)

## 2024-11-07 DIAGNOSIS — I480 Paroxysmal atrial fibrillation: Secondary | ICD-10-CM

## 2024-11-07 LAB — BASIC METABOLIC PANEL WITH GFR
Anion gap: 13 (ref 5–15)
BUN: 16 mg/dL (ref 8–23)
CO2: 25 mmol/L (ref 22–32)
Calcium: 8.9 mg/dL (ref 8.9–10.3)
Chloride: 100 mmol/L (ref 98–111)
Creatinine, Ser: 1.08 mg/dL — ABNORMAL HIGH (ref 0.44–1.00)
GFR, Estimated: 50 mL/min — ABNORMAL LOW (ref >60)
Glucose, Bld: 155 mg/dL — ABNORMAL HIGH (ref 70–99)
Potassium: 3.9 mmol/L (ref 3.5–5.1)
Sodium: 138 mmol/L (ref 135–145)

## 2024-11-07 LAB — HEPATIC FUNCTION PANEL
ALT: 49 U/L — ABNORMAL HIGH (ref 0–44)
AST: 59 U/L — ABNORMAL HIGH (ref 15–41)
Albumin: 2.9 g/dL — ABNORMAL LOW (ref 3.5–5.0)
Alkaline Phosphatase: 490 U/L — ABNORMAL HIGH (ref 38–126)
Bilirubin, Direct: 0.2 mg/dL (ref 0.0–0.2)
Indirect Bilirubin: 0.7 mg/dL (ref 0.3–0.9)
Total Bilirubin: 0.9 mg/dL (ref 0.0–1.2)
Total Protein: 5.9 g/dL — ABNORMAL LOW (ref 6.5–8.1)

## 2024-11-07 LAB — CBC
HCT: 26.9 % — ABNORMAL LOW (ref 36.0–46.0)
Hemoglobin: 7.9 g/dL — ABNORMAL LOW (ref 12.0–15.0)
MCH: 24.8 pg — ABNORMAL LOW (ref 26.0–34.0)
MCHC: 29.4 g/dL — ABNORMAL LOW (ref 30.0–36.0)
MCV: 84.3 fL (ref 80.0–100.0)
Platelets: 242 K/uL (ref 150–400)
RBC: 3.19 MIL/uL — ABNORMAL LOW (ref 3.87–5.11)
RDW: 19.2 % — ABNORMAL HIGH (ref 11.5–15.5)
WBC: 7.7 K/uL (ref 4.0–10.5)
nRBC: 0 % (ref 0.0–0.2)

## 2024-11-07 LAB — APTT: aPTT: 39 s — ABNORMAL HIGH (ref 24–36)

## 2024-11-07 LAB — GLUCOSE, CAPILLARY
Glucose-Capillary: 138 mg/dL — ABNORMAL HIGH (ref 70–99)
Glucose-Capillary: 135 mg/dL — ABNORMAL HIGH (ref 70–99)
Glucose-Capillary: 177 mg/dL — ABNORMAL HIGH (ref 70–99)
Glucose-Capillary: 221 mg/dL — ABNORMAL HIGH (ref 70–99)

## 2024-11-07 LAB — HEPARIN LEVEL (UNFRACTIONATED): Heparin Unfractionated: 0.64 [IU]/mL (ref 0.30–0.70)

## 2024-11-07 LAB — PROTIME-INR
INR: 1.1 (ref 0.8–1.2)
Prothrombin Time: 14.4 s (ref 11.4–15.2)

## 2024-11-07 MED ORDER — HEPARIN (PORCINE) 25000 UT/250ML-% IV SOLN
500.0000 [IU]/h | INTRAVENOUS | Status: DC
  Administered 2024-11-07: 500 [IU]/h via INTRAVENOUS
  Filled 2024-11-07: qty 250

## 2024-11-07 MED ORDER — FENTANYL CITRATE (PF) 100 MCG/2ML IJ SOLN
INTRAMUSCULAR | Status: AC | PRN
  Administered 2024-11-07: 50 ug via INTRAVENOUS

## 2024-11-07 MED ORDER — FENTANYL CITRATE (PF) 100 MCG/2ML IJ SOLN
INTRAMUSCULAR | Status: AC
  Filled 2024-11-07: qty 2

## 2024-11-07 MED ORDER — MIDAZOLAM HCL (PF) 2 MG/2ML IJ SOLN
INTRAMUSCULAR | Status: AC | PRN
  Administered 2024-11-07: 1 mg via INTRAVENOUS

## 2024-11-07 MED ORDER — GELATIN ABSORBABLE 12-7 MM EX MISC
1.0000 | Freq: Once | CUTANEOUS | Status: DC
  Filled 2024-11-07: qty 1

## 2024-11-07 MED ORDER — MIDAZOLAM HCL 2 MG/2ML IJ SOLN
INTRAMUSCULAR | Status: AC
  Filled 2024-11-07: qty 2

## 2024-11-07 MED ORDER — LIDOCAINE HCL (PF) 1 % IJ SOLN
10.0000 mL | Freq: Once | INTRAMUSCULAR | Status: DC
  Filled 2024-11-07 (×2): qty 10

## 2024-11-07 NOTE — Procedures (Signed)
 Interventional Radiology Procedure Note  Procedure: US  guided core biopsy of liver lesion  Complications: None  Estimated Blood Loss: None  Recommendations: - bedrest x 2 hrs - path sent  Signed,  Wilkie LOIS Lent, MD

## 2024-11-07 NOTE — Inpatient Diabetes Management (Signed)
 Inpatient Diabetes Program Recommendations  AACE/ADA: New Consensus Statement on Inpatient Glycemic Control (2015)  Target Ranges:  Prepandial:   less than 140 mg/dL      Peak postprandial:   less than 180 mg/dL (1-2 hours)      Critically ill patients:  140 - 180 mg/dL   Lab Results  Component Value Date   GLUCAP 135 (H) 11/07/2024   HGBA1C 10.2 (H) 11/05/2024    Review of Glycemic Control  Latest Reference Range & Units 11/06/24 09:16 11/06/24 11:45 11/06/24 16:26 11/06/24 19:30 11/07/24 09:44 11/07/24 11:32  Glucose-Capillary 70 - 99 mg/dL 766 (H) 838 (H) 827 (H) 157 (H) 138 (H) 135 (H)   Diabetes history: DM 2 Outpatient Diabetes medications:  Jardiance  25 mg daily Current orders for Inpatient glycemic control:  Novolog  0-6 units tid with meals  Inpatient Diabetes Program Recommendations:    Note A1C =10.2% (indicating average CBG of 246 mg/dL).  CBG's in the hospital have been within hospital goals. No recommendations at this time for changes in home DM medications.   Thanks,  Randall Bullocks, RN, BC-ADM Inpatient Diabetes Coordinator Pager [phone]  (8a-5p)

## 2024-11-07 NOTE — Plan of Care (Signed)
   Problem: Education: Goal: Ability to describe self-care measures that may prevent or decrease complications (Diabetes Survival Skills Education) will improve Outcome: Progressing Goal: Individualized Educational Video(s) Outcome: Progressing

## 2024-11-07 NOTE — Progress Notes (Signed)
 PHARMACY - ANTICOAGULATION CONSULT NOTE  Pharmacy Consult for heparin  Indication: atrial fibrillation, hx of CVA  Allergies  Allergen Reactions   Metformin  And Related Other (See Comments)    Diarrhea     Patient Measurements: Height: 5' 4 (162.6 cm) Weight: 71.8 kg (158 lb 4.6 oz) (Wt from 07/17/2024) IBW/kg (Calculated) : 54.7 HEPARIN  DW (KG): 69.4  Vital Signs: Temp: 97.7 F (36.5 C) (12/12 0451) Temp Source: Oral (12/12 0451) BP: 159/70 (12/12 0944) Pulse Rate: 70 (12/12 0944)  Labs: Recent Labs    11/05/24 0152 11/05/24 1443 11/06/24 0606 11/06/24 0716 11/06/24 1948 11/07/24 0457  HGB 8.1*  --   --   --  7.5* 7.9*  HCT 27.6*  --   --   --  25.8* 26.9*  PLT 261  --   --   --  256 242  APTT  --    < > >200*  --  160* 39*  LABPROT  --   --   --   --   --  14.4  INR  --   --   --   --   --  1.1  HEPARINUNFRC  --    < > >1.10*  --  >1.10* 0.64  CREATININE 1.21*  --   --  1.01*  --  1.08*   < > = values in this interval not displayed.    Estimated Creatinine Clearance: 37 mL/min (A) (by C-G formula based on SCr of 1.08 mg/dL (H)).  Assessment: 34 yoF presented to ED with abdominal pain found to have liver lesion s/p elevated LFTs. Pharmacy consulted to dose heparin  for afib while Eliquis  on hold. 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 Last aPTT >200, heparin  held for 1h and resumed at 550 units/hr. Repeat aPTT 160 and supratherapeutic. Heparin  turned off at 21:17. 12/12: Heparin  level 0.64, aPTT 39 off heparin  drip. Now s/p liver biopsy. Resume heparin  drip 2h s/p biopsy and ok to resume Eliquis  in 24-48h per Dr Karalee.   Goal of Therapy:   Heparin  level 0.3-0.7 units/ml aPTT 66-102 seconds Monitor platelets by anticoagulation protocol: Yes   Plan:  Restart heparin  drip at 500 units/hr at 11:30 - no bolus 8 hr aPTT Daily heparin  level, aPTT, and CBC Monitor for s/sx of bleeding  Thank you for involving pharmacy in this patient's care.  Delon Sax, PharmD, BCPS Clinical Pharmacist 11/07/2024 10:42 AM

## 2024-11-07 NOTE — Progress Notes (Signed)
 PROGRESS NOTE    KIMMIE BERGGREN  FMW:[id number] DOB: [birth year] DOA: 11/04/2024 PCP: Shelda Atlas, MD  Subjective:  Due to concerns of gradually decreasing Hgb with no signs of bleed, heparin  drip was held last night. Seen and examined at bedside after liver biopsy procedure. Tolerated procedure well. Reports pain controlled. Denies nausea, vomiting, constipation.   Hospital Course: As per H&P: 83 y.o. female with history of stroke with carotid stenosis status post TCAR, chronic atrial fibrillation, complete heart block status post pacemaker placement, diabetes mellitus type 2, chronic anemia brought to the ER after patient was complaining of abdominal pain.  As per patient's daughter with whom I spoke over the telephone patient has been complaining of epigastric discomfort with poor appetite for the last 2 to 3 weeks.  Did not have any nausea vomiting or diarrhea fever or chills.  Patient's amlodipine  was recently discontinued due to low blood pressure.  Gabapentin also was discontinued recently.    Assessment and Plan:  Liver lesions Possible colon cancer Etiology unclear, concern for metastatic disease MRI abdomen showed diffuse liver metastases, short segment annular wall thickening in proximal ascending colon, highly suspicious for primary colon carcinoma, enlarged R pericolonic lymph nodes Status post IR liver biopsy on 12/12 AFP not elevated Viral hepatitis panel pending Follow up biopsy results   Orthostatic hypotension Drop in diastolic BP > 10 on standing on 12/11 Cont IV fluids and continue until taking PO well Encourage oral intake/hydration Will repeat orthostatic vitals    Heartburn/Nausea Etiology unclear,  maybe related to liver lesions, dyspepsia Cont pantoprazole  daily Cont maalox PRN Cont zofran  PRN Encourage oral intake/hydration   Lung lesion MRI abdomen showed new ill-defined T2 hyperintensity in posterior L lower lobe CT chest with no definitive  metastatic disease Concern for acute infection low as no respiratory symptoms Monitor clinically   Atrial fibrillation Complete heart block status post pacemaker Cont metoprolol   Resume heparin  drip 2 hr after biopsy as per IR recs Plan to transition to home eliquis  on Sun 12/14   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 Heparin  drip as elsewhere Plan to transition to home eliquis  on Sun 12/14 Hold home statin given elevated LFTs   Chronic anemia Hgb 7.9 < 8.1, baseline 7-8 Anticoagulation as elsewhere Monitor CBC   T2DM HgbA1c 10.1 Cont SSI with FS ACHS   CKD 3 Cr 1.01 < 1.21, baseline 0.9-1.1 Monitor clinically   HTN Cont metoprolol    Possible cognitive impairment As per daughter, some concerns of memory loss more recently May need outpatient neuropsychological testing for dementia  DVT prophylaxis: Place and maintain sequential compression device Start: 11/05/24 0410  SCDs   Code Status: Limited: Do not attempt resuscitation (DNR) -DNR-LIMITED -Do Not Intubate/DNI   Disposition Plan: SNF Reason for continuing need for hospitalization: monitor for signs of bleeding, IV heparin , plan to resume PO anticoagulation in 2 days, needs SNF placement  Objective: Vitals:   11/07/24 0903 11/07/24 0910 11/07/24 0915 11/07/24 0944  BP: (!) 181/79 (!) 180/81 (!) 165/77 (!) 159/70  Pulse: 70 70 70 70  Resp: 20 20 18 16   Temp:      TempSrc:      SpO2: 96% 100% 100% 96%  Weight:      Height:        Intake/Output Summary (Last 24 hours) at 11/07/2024 1124 Last data filed at 11/06/2024 2120 Gross per 24 hour  Intake 687.89 ml  Output --  Net 687.89 ml   Fredricka  Weights   11/05/24 0500  Weight: 71.8 kg    Examination:  Physical Exam Vitals and nursing note reviewed.  Constitutional:      General: She is not in acute distress.    Appearance: She is ill-appearing.     Comments: Weak, frail  HENT:     Head: Normocephalic and atraumatic.   Cardiovascular:     Rate and Rhythm: Normal rate and regular rhythm.     Pulses: Normal pulses.     Heart sounds: Normal heart sounds.  Pulmonary:     Effort: Pulmonary effort is normal.     Breath sounds: Normal breath sounds.  Abdominal:     General: Bowel sounds are normal.     Palpations: Abdomen is soft.  Neurological:     Mental Status: She is alert.     Data Reviewed: I have personally reviewed following labs and imaging studies  CBC: Recent Labs  Lab 11/04/24 1542 11/05/24 0152 11/06/24 1948 11/07/24 0457  WBC 8.4 8.4 7.7 7.7  NEUTROABS  --  6.0  --   --   HGB 8.9* 8.1* 7.5* 7.9*  HCT 30.0* 27.6* 25.8* 26.9*  MCV 86.2 84.7 84.3 84.3  PLT 302 261 256 242   Basic Metabolic Panel: Recent Labs  Lab 11/04/24 1542 11/05/24 0152 11/06/24 0716 11/07/24 0457  NA 139 138 139 138  K 4.0 3.0* 4.7 3.9  CL 102 101 100 100  CO2 25 27 26 25   GLUCOSE 286* 207* 187* 155*  BUN 19 17 17 16   CREATININE 1.17* 1.21* 1.01* 1.08*  CALCIUM  7.9* 8.3* 8.7* 8.9   GFR: Estimated Creatinine Clearance: 37 mL/min (A) (by C-G formula based on SCr of 1.08 mg/dL (H)). Liver Function Tests: Recent Labs  Lab 11/04/24 1542 11/05/24 0152 11/06/24 0716 11/07/24 0457  AST 63* 53* 48* 59*  ALT 39 40 42 49*  ALKPHOS 467* 485* 520* 490*  BILITOT 2.1* 0.9 1.2 0.9  PROT 5.6* 5.9* 5.9* 5.9*  ALBUMIN 2.9* 2.9* 2.9* 2.9*   Recent Labs  Lab 11/04/24 1542  LIPASE 35   No results for input(s): AMMONIA in the last 168 hours. Coagulation Profile: Recent Labs  Lab 11/07/24 0457  INR 1.1   Cardiac Enzymes: No results for input(s): CKTOTAL, CKMB, CKMBINDEX, TROPONINI in the last 168 hours. ProBNP, BNP (last 5 results) No results for input(s): PROBNP, BNP in the last 8760 hours. HbA1C: Recent Labs    11/05/24 0152  HGBA1C 10.2*   CBG: Recent Labs  Lab 11/06/24 0916 11/06/24 1145 11/06/24 1626 11/06/24 1930 11/07/24 0944  GLUCAP 233* 161* 172* 157* 138*    Lipid Profile: No results for input(s): CHOL, HDL, LDLCALC, TRIG, CHOLHDL, LDLDIRECT in the last 72 hours. Thyroid Function Tests: No results for input(s): TSH, T4TOTAL, FREET4, T3FREE, THYROIDAB in the last 72 hours. Anemia Panel: No results for input(s): VITAMINB12, FOLATE, FERRITIN, TIBC, IRON, RETICCTPCT in the last 72 hours. Sepsis Labs: No results for input(s): PROCALCITON, LATICACIDVEN in the last 168 hours.  No results found for this or any previous visit (from the past 240 hours).   Radiology Studies: CT CHEST WO CONTRAST Result Date: 11/05/2024 EXAM: CT CHEST WITHOUT CONTRAST 11/05/2024 12:37:11 PM TECHNIQUE: CT of the chest was performed without the administration of intravenous contrast. Multiplanar reformatted images are provided for review. Automated exposure control, iterative reconstruction, and/or weight based adjustment of the mA/kV was utilized to reduce the radiation dose to as low as reasonably achievable. COMPARISON: CT of the abdomen and  pelvis dated 11/04/2024. CLINICAL HISTORY: Metastasis evaluation, Patient with liver lesions/metastases and possible colon cancer, evaluate Left lung lesion/hyperdensity. FINDINGS: MEDIASTINUM: Heart: Normal in size. Moderate calcific coronary artery disease. A cardiac pacemaker is present. Pericardium is unremarkable. The central airways are clear. The thoracic aorta demonstrates moderate calcific atheromatous disease. There is a vascular stent within the right common carotid artery. LYMPH NODES: No mediastinal, hilar or axillary lymphadenopathy. LUNGS AND PLEURA: There is a patchy area of ground-glass and subsolid opacities present posteromedially within the left lower lobe on images 80 through 88 of series 3, which appeared most likely to be infectious or inflammatory. There are streaky linear opacities present medially within the right lower lobe, likely representing compressive atelectasis from the  thoracic osteophytes. There is minimal dependent atelectasis. The lungs are clear otherwise. No pleural effusion or pneumothorax. SOFT TISSUES/BONES: No acute abnormality of the bones or soft tissues. There are no osseous lesions concerning for metastatic disease. UPPER ABDOMEN: Numerous lesions again demonstrated within the liver highly concerning for hepatic metastatic disease. Limited images of the upper abdomen demonstrates no acute abnormality. IMPRESSION: 1. Patchy ground-glass and subsolid opacities in the left lower lobe, likely infectious or inflammatory. Attention on follow up is suggested. 2. Streaky linear opacities in the right lower lobe, likely compressive atelectasis from thoracic osteophytes. 3. Numerous liver lesions highly concerning for hepatic metastatic disease. 4. No thoracic lymphadenopathy or osseous lesions to suggest metastatic disease; no convincing evidence of thoracic metastatic disease. Electronically signed by: Evalene Coho MD 11/05/2024 01:04 PM EST RP Workstation: [id number]    Scheduled Meds:  gelatin adsorbable  1 each Topical Once   insulin  aspart  0-6 Units Subcutaneous TID WC   lidocaine  (PF)  10 mL Other Once   metoprolol  tartrate  25 mg Oral BID   pantoprazole   40 mg Oral Q0600   Continuous Infusions:  heparin      lactated ringers  75 mL/hr at 11/07/24 0225     LOS: 1 day   Norval Bar, MD  [facility]  11/07/2024, 11:24 AM

## 2024-11-08 DIAGNOSIS — Z8673 Personal history of transient ischemic attack (TIA), and cerebral infarction without residual deficits: Secondary | ICD-10-CM

## 2024-11-08 LAB — GLUCOSE, CAPILLARY
Glucose-Capillary: 180 mg/dL — ABNORMAL HIGH (ref 70–99)
Glucose-Capillary: 230 mg/dL — ABNORMAL HIGH (ref 70–99)
Glucose-Capillary: 172 mg/dL — ABNORMAL HIGH (ref 70–99)
Glucose-Capillary: 184 mg/dL — ABNORMAL HIGH (ref 70–99)

## 2024-11-08 LAB — CBC
HCT: 26.3 % — ABNORMAL LOW (ref 36.0–46.0)
Hemoglobin: 7.8 g/dL — ABNORMAL LOW (ref 12.0–15.0)
MCH: 24.9 pg — ABNORMAL LOW (ref 26.0–34.0)
MCHC: 29.7 g/dL — ABNORMAL LOW (ref 30.0–36.0)
MCV: 84 fL (ref 80.0–100.0)
Platelets: 228 K/uL (ref 150–400)
RBC: 3.13 MIL/uL — ABNORMAL LOW (ref 3.87–5.11)
RDW: 19.2 % — ABNORMAL HIGH (ref 11.5–15.5)
WBC: 7.3 K/uL (ref 4.0–10.5)
nRBC: 0.3 % — ABNORMAL HIGH (ref 0.0–0.2)

## 2024-11-08 LAB — HEPATIC FUNCTION PANEL
ALT: 50 U/L — ABNORMAL HIGH (ref 0–44)
AST: 51 U/L — ABNORMAL HIGH (ref 15–41)
Albumin: 2.7 g/dL — ABNORMAL LOW (ref 3.5–5.0)
Alkaline Phosphatase: 497 U/L — ABNORMAL HIGH (ref 38–126)
Bilirubin, Direct: 0.2 mg/dL (ref 0.0–0.2)
Indirect Bilirubin: 0.6 mg/dL (ref 0.3–0.9)
Total Bilirubin: 0.8 mg/dL (ref 0.0–1.2)
Total Protein: 5.6 g/dL — ABNORMAL LOW (ref 6.5–8.1)

## 2024-11-08 LAB — BASIC METABOLIC PANEL WITH GFR
Anion gap: 4 — ABNORMAL LOW (ref 5–15)
BUN: 15 mg/dL (ref 8–23)
CO2: 30 mmol/L (ref 22–32)
Calcium: 8.5 mg/dL — ABNORMAL LOW (ref 8.9–10.3)
Chloride: 103 mmol/L (ref 98–111)
Creatinine, Ser: 1.07 mg/dL — ABNORMAL HIGH (ref 0.44–1.00)
GFR, Estimated: 51 mL/min — ABNORMAL LOW (ref >60)
Glucose, Bld: 199 mg/dL — ABNORMAL HIGH (ref 70–99)
Potassium: 3.9 mmol/L (ref 3.5–5.1)
Sodium: 137 mmol/L (ref 135–145)

## 2024-11-08 MED ORDER — ESCITALOPRAM OXALATE 10 MG PO TABS
5.0000 mg | ORAL_TABLET | Freq: Every day | ORAL | Status: DC
  Administered 2024-11-08 – 2024-11-12 (×5): 5 mg via ORAL
  Filled 2024-11-08 (×6): qty 1

## 2024-11-08 MED ORDER — ACETAMINOPHEN 325 MG PO TABS
650.0000 mg | ORAL_TABLET | Freq: Four times a day (QID) | ORAL | Status: DC | PRN
  Administered 2024-11-08 – 2024-11-10 (×3): 650 mg via ORAL
  Filled 2024-11-08 (×3): qty 2

## 2024-11-08 NOTE — Plan of Care (Signed)

## 2024-11-08 NOTE — Progress Notes (Signed)
 PROGRESS NOTE    Amanda Stewart  FMW:[id number] DOB: 05-24-1941 DOA: 11/04/2024 PCP: Shelda Atlas, MD  Subjective: No acute events overnight. Seen and examined at bedside. No new complaints. Reports abdominal pain is better. Tolerating oral intake without n/v. Denies constipation.   Hospital Course: As per H&P: 83 y.o. female with history of stroke with carotid stenosis status post TCAR, chronic atrial fibrillation, complete heart block status post pacemaker placement, diabetes mellitus type 2, chronic anemia brought to the ER after patient was complaining of abdominal pain.  As per patient's daughter with whom I spoke over the telephone patient has been complaining of epigastric discomfort with poor appetite for the last 2 to 3 weeks.  Did not have any nausea vomiting or diarrhea fever or chills.  Patient's amlodipine  was recently discontinued due to low blood pressure.  Gabapentin also was discontinued recently.    Assessment and Plan:  Liver lesions Possible colon cancer Etiology unclear, concern for metastatic disease MRI abdomen showed diffuse liver metastases, short segment annular wall thickening in proximal ascending colon, highly suspicious for primary colon carcinoma, enlarged R pericolonic lymph nodes Status post IR liver biopsy on 12/12 AFP not elevated Viral hepatitis panel negative Follow up biopsy results Will need to follow up with PCP and possibly oncology closely  Heartburn/Nausea Improving Etiology unclear,  maybe related to liver lesions, dyspepsia Cont pantoprazole  daily Cont maalox PRN Cont zofran  PRN Encourage oral intake/hydration   Lung lesion MRI abdomen showed new ill-defined T2 hyperintensity in posterior L lower lobe CT chest with no definitive metastatic disease Concern for acute infection low as no respiratory symptoms Monitor clinically   Atrial fibrillation Complete heart block status post pacemaker Cont metoprolol   Stopped heparin  drip  Plan  to restart home eliquis  on Sun 12/14   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 Stopped heparin  drip as no need to bridge for old CVA Plan to restart home eliquis  on Sun 12/14 Hold home statin given elevated LFTs   Chronic anemia Hgb 7.8 < 8.1, baseline 7-8 Anticoagulation as elsewhere Monitor CBC   T2DM HgbA1c 10.1 Cont SSI with FS ACHS   CKD 3 Cr 1.01 < 1.21, baseline 0.9-1.1 Monitor clinically   HTN Cont metoprolol   Orthostatic hypotension Resolved with IV fluids Encourage oral intake/hydration   Possible cognitive impairment As per daughter, some concerns of memory loss more recently May need outpatient neuropsychological testing for dementia  DVT prophylaxis: Place and maintain sequential compression device Start: 11/05/24 0410  SCDs   Code Status: Limited: Do not attempt resuscitation (DNR) -DNR-LIMITED -Do Not Intubate/DNI   Disposition Plan: SNF Reason for continuing need for hospitalization: medically ready, placement pending  Objective: Vitals:   11/08/24 0034 11/08/24 0445 11/08/24 0753 11/08/24 1134  BP: (!) 153/64 (!) 162/66 (!) 178/86 131/77  Pulse: 70 70 70 71  Resp:   17 16  Temp: 98.1 F (36.7 C) 97.6 F (36.4 C) 97.8 F (36.6 C) 98.2 F (36.8 C)  TempSrc:      SpO2: 99% 98% 98% 100%  Weight:      Height:        Intake/Output Summary (Last 24 hours) at 11/08/2024 1140 Last data filed at 11/08/2024 0955 Gross per 24 hour  Intake 909.95 ml  Output 250 ml  Net 659.95 ml   Filed Weights   11/05/24 0500  Weight: 71.8 kg    Examination:  Physical Exam Vitals and nursing note reviewed.  Constitutional:  General: She is not in acute distress.    Appearance: She is ill-appearing.     Comments: Weak, frail  HENT:     Head: Normocephalic and atraumatic.  Cardiovascular:     Rate and Rhythm: Normal rate and regular rhythm.     Pulses: Normal pulses.     Heart sounds: Normal heart sounds.  Pulmonary:     Effort:  Pulmonary effort is normal.     Breath sounds: Normal breath sounds.  Abdominal:     General: Bowel sounds are normal.     Palpations: Abdomen is soft.  Neurological:     Mental Status: She is alert. Mental status is at baseline.     Data Reviewed: I have personally reviewed following labs and imaging studies  CBC: Recent Labs  Lab 11/04/24 1542 11/05/24 0152 11/06/24 1948 11/07/24 0457 11/08/24 0456  WBC 8.4 8.4 7.7 7.7 7.3  NEUTROABS  --  6.0  --   --   --   HGB 8.9* 8.1* 7.5* 7.9* 7.8*  HCT 30.0* 27.6* 25.8* 26.9* 26.3*  MCV 86.2 84.7 84.3 84.3 84.0  PLT 302 261 256 242 228   Basic Metabolic Panel: Recent Labs  Lab 11/04/24 1542 11/05/24 0152 11/06/24 0716 11/07/24 0457 11/08/24 0456  NA 139 138 139 138 137  K 4.0 3.0* 4.7 3.9 3.9  CL 102 101 100 100 103  CO2 25 27 26 25 30   GLUCOSE 286* 207* 187* 155* 199*  BUN 19 17 17 16 15   CREATININE 1.17* 1.21* 1.01* 1.08* 1.07*  CALCIUM  7.9* 8.3* 8.7* 8.9 8.5*   GFR: Estimated Creatinine Clearance: 37.3 mL/min (A) (by C-G formula based on SCr of 1.07 mg/dL (H)). Liver Function Tests: Recent Labs  Lab 11/04/24 1542 11/05/24 0152 11/06/24 0716 11/07/24 0457 11/08/24 0456  AST 63* 53* 48* 59* 51*  ALT 39 40 42 49* 50*  ALKPHOS 467* 485* 520* 490* 497*  BILITOT 2.1* 0.9 1.2 0.9 0.8  PROT 5.6* 5.9* 5.9* 5.9* 5.6*  ALBUMIN 2.9* 2.9* 2.9* 2.9* 2.7*   Recent Labs  Lab 11/04/24 1542  LIPASE 35   No results for input(s): AMMONIA in the last 168 hours. Coagulation Profile: Recent Labs  Lab 11/07/24 0457  INR 1.1   Cardiac Enzymes: No results for input(s): CKTOTAL, CKMB, CKMBINDEX, TROPONINI in the last 168 hours. ProBNP, BNP (last 5 results) No results for input(s): PROBNP, BNP in the last 8760 hours. HbA1C: No results for input(s): HGBA1C in the last 72 hours. CBG: Recent Labs  Lab 11/07/24 1132 11/07/24 1600 11/07/24 2015 11/08/24 0751 11/08/24 1135  GLUCAP 135* 177* 221* 180*  230*   Lipid Profile: No results for input(s): CHOL, HDL, LDLCALC, TRIG, CHOLHDL, LDLDIRECT in the last 72 hours. Thyroid Function Tests: No results for input(s): TSH, T4TOTAL, FREET4, T3FREE, THYROIDAB in the last 72 hours. Anemia Panel: No results for input(s): VITAMINB12, FOLATE, FERRITIN, TIBC, IRON, RETICCTPCT in the last 72 hours. Sepsis Labs: No results for input(s): PROCALCITON, LATICACIDVEN in the last 168 hours.  No results found for this or any previous visit (from the past 240 hours).   Radiology Studies: US  BIOPSY (LIVER) Result Date: 11/07/2024 INDICATION: 83 year old female with extensive metastatic hepatic disease. She presents for ultrasound-guided core biopsy to confirm tissue diagnosis. EXAM: ULTRASOUND BIOPSY CORE LIVER MEDICATIONS: None. ANESTHESIA/SEDATION: Moderate (conscious) sedation was employed during this procedure. A total of Versed  1 mg and Fentanyl  50 mcg was administered intravenously by the Radiology nurse. Moderate Sedation Time: 11 minutes. The  patient's level of consciousness and vital signs were monitored continuously by radiology nursing throughout the procedure under my direct supervision. FLUOROSCOPY TIME:  None. COMPLICATIONS: None immediate. PROCEDURE: Informed written consent was obtained from the patient after a thorough discussion of the procedural risks, benefits and alternatives. All questions were addressed. Maximal Sterile Barrier Technique was utilized including caps, mask, sterile gowns, sterile gloves, sterile drape, hand hygiene and skin antiseptic. A timeout was performed prior to the initiation of the procedure. The right upper quadrant was interrogated with ultrasound. Multiple hypoechoic solid masses. A suitable skin entry site was selected and marked. The region was sterilely prepped and draped in the standard fashion using chlorhexidine  skin prep. Local anesthesia was attained by infiltration with 1%  lidocaine . A small dermatotomy was made. Under real-time sonographic guidance, a 17 gauge introducer needle was advanced into the margin of the mass. Multiple 18 gauge biopsies were then coaxially obtained using the BioPince automated biopsy device. Biopsy samples were placed in formalin and delivered to pathology for further analysis. As a 17 gauge introducer needle was removed, the biopsy tract was embolized with a Gel-Foam slurry. Post biopsy ultrasound imaging demonstrates no evidence of active hemorrhage or perihepatic hematoma. The patient tolerated the procedure well. IMPRESSION: Technically successful ultrasound-guided core biopsy of hepatic lesion. Electronically Signed   By: Wilkie Lent M.D.   On: 11/07/2024 12:53    Scheduled Meds:  escitalopram   5 mg Oral QHS   gelatin adsorbable  1 each Topical Once   insulin  aspart  0-6 Units Subcutaneous TID WC   lidocaine  (PF)  10 mL Other Once   metoprolol  tartrate  25 mg Oral BID   pantoprazole   40 mg Oral Q0600   Continuous Infusions:   LOS: 2 days   Norval Bar, MD  [facility]  11/08/2024, 11:40 AM

## 2024-11-08 NOTE — Plan of Care (Signed)

## 2024-11-08 NOTE — Progress Notes (Signed)
 SLP Cancellation Note  Patient Details Name: Amanda Stewart MRN: [id number] DOB: Dec 24, 1940   Cancelled treatment:        Pt attempted to be seen for cognitive evaluation. Upon arrival pt requesting the bed pan, SLP got staff for assistance who reports she takes a long time. SLP to f/u as time permits.  Manuelita Blew M.S. CCC-SLP

## 2024-11-09 LAB — GLUCOSE, CAPILLARY
Glucose-Capillary: 171 mg/dL — ABNORMAL HIGH (ref 70–99)
Glucose-Capillary: 185 mg/dL — ABNORMAL HIGH (ref 70–99)
Glucose-Capillary: 171 mg/dL — ABNORMAL HIGH (ref 70–99)
Glucose-Capillary: 160 mg/dL — ABNORMAL HIGH (ref 70–99)

## 2024-11-09 LAB — CBC
HCT: 29.2 % — ABNORMAL LOW (ref 36.0–46.0)
Hemoglobin: 8.7 g/dL — ABNORMAL LOW (ref 12.0–15.0)
MCH: 24.6 pg — ABNORMAL LOW (ref 26.0–34.0)
MCHC: 29.8 g/dL — ABNORMAL LOW (ref 30.0–36.0)
MCV: 82.5 fL (ref 80.0–100.0)
Platelets: 263 K/uL (ref 150–400)
RBC: 3.54 MIL/uL — ABNORMAL LOW (ref 3.87–5.11)
RDW: 19 % — ABNORMAL HIGH (ref 11.5–15.5)
WBC: 8 K/uL (ref 4.0–10.5)
nRBC: 0 % (ref 0.0–0.2)

## 2024-11-09 MED ORDER — HYDRALAZINE HCL 20 MG/ML IJ SOLN
5.0000 mg | INTRAMUSCULAR | Status: DC | PRN
  Administered 2024-11-09: 5 mg via INTRAVENOUS
  Filled 2024-11-09: qty 1

## 2024-11-09 MED ORDER — NALOXONE HCL 0.4 MG/ML IJ SOLN: 0.4000 mg | INTRAMUSCULAR | Status: DC | PRN

## 2024-11-09 MED ORDER — OXYCODONE HCL 5 MG PO TABS: 5.0000 mg | ORAL_TABLET | Freq: Once | ORAL | Status: DC | PRN

## 2024-11-09 MED ORDER — LOSARTAN POTASSIUM 50 MG PO TABS
100.0000 mg | ORAL_TABLET | Freq: Every day | ORAL | Status: DC
  Administered 2024-11-09 – 2024-11-13 (×5): 100 mg via ORAL
  Filled 2024-11-09 (×5): qty 2

## 2024-11-09 MED ORDER — INSULIN ASPART 100 UNIT/ML IJ SOLN
0.0000 [IU] | Freq: Three times a day (TID) | INTRAMUSCULAR | Status: AC
  Administered 2024-11-09: 2 [IU] via SUBCUTANEOUS
  Administered 2024-11-10: 14:00:00 5 [IU] via SUBCUTANEOUS
  Administered 2024-11-10: 18:00:00 2 [IU] via SUBCUTANEOUS
  Administered 2024-11-11: 09:00:00 3 [IU] via SUBCUTANEOUS
  Administered 2024-11-11: 13:00:00 5 [IU] via SUBCUTANEOUS
  Administered 2024-11-11: 17:00:00 2 [IU] via SUBCUTANEOUS
  Administered 2024-11-12: 17:00:00 3 [IU] via SUBCUTANEOUS
  Administered 2024-11-12: 13:00:00 7 [IU] via SUBCUTANEOUS
  Administered 2024-11-12: 10:00:00 2 [IU] via SUBCUTANEOUS
  Administered 2024-11-13: 10:00:00 1 [IU] via SUBCUTANEOUS
  Administered 2024-11-13: 12:00:00 5 [IU] via SUBCUTANEOUS
  Filled 2024-11-09: qty 5
  Filled 2024-11-09: qty 2
  Filled 2024-11-09: qty 5
  Filled 2024-11-09: qty 3
  Filled 2024-11-09: qty 1
  Filled 2024-11-09 (×2): qty 2
  Filled 2024-11-09: qty 3
  Filled 2024-11-09: qty 2
  Filled 2024-11-09 (×2): qty 5

## 2024-11-09 MED ORDER — APIXABAN 5 MG PO TABS
5.0000 mg | ORAL_TABLET | Freq: Two times a day (BID) | ORAL | Status: DC
  Administered 2024-11-09 – 2024-11-13 (×9): 5 mg via ORAL
  Filled 2024-11-09 (×9): qty 1

## 2024-11-09 MED ORDER — ASPIRIN 81 MG PO TBEC
81.0000 mg | DELAYED_RELEASE_TABLET | Freq: Every day | ORAL | Status: DC
  Administered 2024-11-09 – 2024-11-13 (×5): 81 mg via ORAL
  Filled 2024-11-09 (×5): qty 1

## 2024-11-09 NOTE — Progress Notes (Signed)
 PROGRESS NOTE    Amanda Stewart  FMW:[id number] DOB: 07/24/1941 DOA: 11/04/2024 PCP: Shelda Atlas, MD  Subjective: Noted to have acute onset headache overnight that didn't improve with tylenol  with associated high BP for which oxycodone  given once. Seen and examined at bedside. Appears somnolent. Unable to recall episode of headache from the night. No new complaints at this time. Denies nausea, vomiting, constipation.   Hospital Course: As per H&P: 83 y.o. female with history of stroke with carotid stenosis status post TCAR, chronic atrial fibrillation, complete heart block status post pacemaker placement, diabetes mellitus type 2, chronic anemia brought to the ER after patient was complaining of abdominal pain.  As per patient's daughter with whom I spoke over the telephone patient has been complaining of epigastric discomfort with poor appetite for the last 2 to 3 weeks.  Did not have any nausea vomiting or diarrhea fever or chills.  Patient's amlodipine  was recently discontinued due to low blood pressure.  Gabapentin also was discontinued recently.    Assessment and Plan:  Liver lesions Possible colon cancer Etiology unclear, concern for metastatic disease MRI abdomen showed diffuse liver metastases, short segment annular wall thickening in proximal ascending colon, highly suspicious for primary colon carcinoma, enlarged R pericolonic lymph nodes Status post IR liver biopsy on 12/12 AFP not elevated Viral hepatitis panel negative Follow up biopsy results Will need to follow up with PCP and possibly oncology closely as outpatient   Heartburn/Nausea Improving Etiology unclear,  maybe related to liver lesions, dyspepsia Cont pantoprazole  daily Cont maalox PRN Cont zofran  PRN Encourage oral intake/hydration   Lung lesion MRI abdomen showed new ill-defined T2 hyperintensity in posterior L lower lobe CT chest with no definitive metastatic disease Concern for acute infection low as no  respiratory symptoms Monitor clinically   Atrial fibrillation Complete heart block status post pacemaker Cont metoprolol   Resume home eliquis    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 Stopped heparin  drip as no need to bridge for old CVA Resume home eliquis  Hold home statin given elevated LFTs   Chronic anemia Hgb 7.8 < 8.1, baseline 7-8 Anticoagulation as elsewhere Monitor CBC   T2DM HgbA1c 10.1 Cont SSI with FS ACHS   CKD 3 Cr 1.01 < 1.21, baseline 0.9-1.1 Monitor clinically   HTN Cont metoprolol    Orthostatic hypotension Resolved with IV fluids Encourage oral intake/hydration   Possible cognitive impairment As per daughter, some concerns of memory loss more recently. Is AAOx3 at baseline May need outpatient neuropsychological testing for dementia  DVT prophylaxis: Place and maintain sequential compression device Start: 11/05/24 0410  Eliquis    Code Status: Limited: Do not attempt resuscitation (DNR) -DNR-LIMITED -Do Not Intubate/DNI   Disposition Plan: SNF Reason for continuing need for hospitalization: medically ready, placement pending  Objective: Vitals:   11/09/24 0020 11/09/24 0400 11/09/24 0724 11/09/24 1122  BP: (!) 173/68 (!) 173/76 (!) 145/72 115/80  Pulse: 70 70 69 70  Resp: 16 16 17 17   Temp: 98.8 F (37.1 C)   98.2 F (36.8 C)  TempSrc: Oral     SpO2: 100% 100% 100% 99%  Weight:      Height:        Intake/Output Summary (Last 24 hours) at 11/09/2024 1126 Last data filed at 11/09/2024 0728 Gross per 24 hour  Intake 600 ml  Output 1000 ml  Net -400 ml   Filed Weights   11/05/24 0500  Weight: 71.8 kg    Examination:  Physical Exam Vitals  and nursing note reviewed.  Constitutional:      General: She is not in acute distress.    Appearance: She is ill-appearing.     Comments: Weak, frail  HENT:     Head: Normocephalic and atraumatic.  Cardiovascular:     Rate and Rhythm: Normal rate and regular rhythm.      Pulses: Normal pulses.     Heart sounds: Normal heart sounds.  Pulmonary:     Effort: Pulmonary effort is normal.     Breath sounds: Normal breath sounds.  Abdominal:     General: Bowel sounds are normal.     Palpations: Abdomen is soft.  Neurological:     Mental Status: She is alert. Mental status is at baseline.     Data Reviewed: I have personally reviewed following labs and imaging studies  CBC: Recent Labs  Lab 11/05/24 0152 11/06/24 1948 11/07/24 0457 11/08/24 0456 11/09/24 0530  WBC 8.4 7.7 7.7 7.3 8.0  NEUTROABS 6.0  --   --   --   --   HGB 8.1* 7.5* 7.9* 7.8* 8.7*  HCT 27.6* 25.8* 26.9* 26.3* 29.2*  MCV 84.7 84.3 84.3 84.0 82.5  PLT 261 256 242 228 263   Basic Metabolic Panel: Recent Labs  Lab 11/04/24 1542 11/05/24 0152 11/06/24 0716 11/07/24 0457 11/08/24 0456  NA 139 138 139 138 137  K 4.0 3.0* 4.7 3.9 3.9  CL 102 101 100 100 103  CO2 25 27 26 25 30   GLUCOSE 286* 207* 187* 155* 199*  BUN 19 17 17 16 15   CREATININE 1.17* 1.21* 1.01* 1.08* 1.07*  CALCIUM  7.9* 8.3* 8.7* 8.9 8.5*   GFR: Estimated Creatinine Clearance: 37.3 mL/min (A) (by C-G formula based on SCr of 1.07 mg/dL (H)). Liver Function Tests: Recent Labs  Lab 11/04/24 1542 11/05/24 0152 11/06/24 0716 11/07/24 0457 11/08/24 0456  AST 63* 53* 48* 59* 51*  ALT 39 40 42 49* 50*  ALKPHOS 467* 485* 520* 490* 497*  BILITOT 2.1* 0.9 1.2 0.9 0.8  PROT 5.6* 5.9* 5.9* 5.9* 5.6*  ALBUMIN 2.9* 2.9* 2.9* 2.9* 2.7*   Recent Labs  Lab 11/04/24 1542  LIPASE 35   No results for input(s): AMMONIA in the last 168 hours. Coagulation Profile: Recent Labs  Lab 11/07/24 0457  INR 1.1   Cardiac Enzymes: No results for input(s): CKTOTAL, CKMB, CKMBINDEX, TROPONINI in the last 168 hours. ProBNP, BNP (last 5 results) No results for input(s): PROBNP, BNP in the last 8760 hours. HbA1C: No results for input(s): HGBA1C in the last 72 hours. CBG: Recent Labs  Lab 11/08/24 1135  11/08/24 1611 11/08/24 2033 11/09/24 0725 11/09/24 1123  GLUCAP 230* 172* 184* 171* 185*   Lipid Profile: No results for input(s): CHOL, HDL, LDLCALC, TRIG, CHOLHDL, LDLDIRECT in the last 72 hours. Thyroid Function Tests: No results for input(s): TSH, T4TOTAL, FREET4, T3FREE, THYROIDAB in the last 72 hours. Anemia Panel: No results for input(s): VITAMINB12, FOLATE, FERRITIN, TIBC, IRON, RETICCTPCT in the last 72 hours. Sepsis Labs: No results for input(s): PROCALCITON, LATICACIDVEN in the last 168 hours.  No results found for this or any previous visit (from the past 240 hours).   Radiology Studies: No results found.  Scheduled Meds:  escitalopram   5 mg Oral QHS   gelatin adsorbable  1 each Topical Once   insulin  aspart  0-6 Units Subcutaneous TID WC   lidocaine  (PF)  10 mL Other Once   losartan   100 mg Oral Daily   metoprolol   tartrate  25 mg Oral BID   pantoprazole   40 mg Oral Q0600   Continuous Infusions:   LOS: 3 days   Norval Bar, MD  [facility]  11/09/2024, 11:26 AM

## 2024-11-09 NOTE — Plan of Care (Signed)

## 2024-11-09 NOTE — Progress Notes (Signed)
 SLP Cancellation Note  Patient Details Name: Amanda Stewart MRN: [id number] DOB: September 15, 1941   Cancelled treatment:       Reason Eval/Treat Not Completed: Other (comment) (patient receiving personal care from caregiver. SLP will continue efforts)   Norleen IVAR Blase, MA, CCC-SLP Speech Therapy  11/09/2024, 4:51 PM

## 2024-11-09 NOTE — Progress Notes (Signed)
 Overnight cross coverage  Informed by RN that patient is complaining of constant 10 out of 10 intensity headache unrelieved with Tylenol  and blood pressure elevated.  Most recent vital signs: Temperature 98.8 F, pulse 70, respiratory rate 16, blood pressure 173/68, and SpO2 100% on room air.  Per RN, no change in neurologic exam and no nausea or vomiting.  No falls or head injury reported.  Per review of chart, anticoagulation currently on hold.  Heparin  drip was stopped on 12/12.  On scheduled metoprolol  for hypertension.  -Oxy IR 5 mg x 1 and continue to reassess -IV hydralazine  PRN SBP >160 -May need head CT if headache does not improve or any change in neurologic exam

## 2024-11-10 LAB — GLUCOSE, CAPILLARY
Glucose-Capillary: 294 mg/dL — ABNORMAL HIGH (ref 70–99)
Glucose-Capillary: 185 mg/dL — ABNORMAL HIGH (ref 70–99)
Glucose-Capillary: 241 mg/dL — ABNORMAL HIGH (ref 70–99)

## 2024-11-10 LAB — SURGICAL PATHOLOGY

## 2024-11-10 NOTE — Evaluation (Signed)
 Speech Language Pathology Evaluation Patient Details Name: Amanda Stewart MRN: [id number] DOB: 06/28/1941 Today's Date: 11/10/2024 Time: 0925-0950 SLP Time Calculation (min) (ACUTE ONLY): 25 min  Problem List:  Patient Active Problem List   Diagnosis Date Noted   Liver mass 11/04/2024   Elevated LFTs 11/04/2024   Abdominal pain 11/04/2024   Acute blood loss anemia 11/11/2023   Increased anion gap metabolic acidosis 11/11/2023   AKI (acute kidney injury) 11/10/2023   Paroxysmal atrial fibrillation (HCC) 10/05/2023   Hypercoagulable state due to paroxysmal atrial fibrillation (HCC) 10/05/2023   Stroke (HCC) 08/29/2023   Physical deconditioning 08/12/2023   Hypotension 08/11/2023   History of COVID-19 08/11/2023   Chronic kidney disease, stage 3a (HCC) 08/11/2023   Hypertension associated with diabetes (HCC) 08/11/2023   Hyperlipidemia associated with type 2 diabetes mellitus (HCC) 08/11/2023   PAD (peripheral artery disease) 08/11/2023   Pacemaker 11/23/2021   Complete heart block (HCC) 08/07/2021   Hyperkalemia 11/04/2015   Rhabdomyolysis 11/04/2015   CAD (coronary artery disease) 11/04/2015   Transaminitis 11/04/2015   Diabetes mellitus, type 2 (HCC) 11/04/2015   Chest pain 12/10/2014   Chest pain at rest 12/10/2014   Cardiac arrest (HCC) 05/24/2014   Acute respiratory failure with hypoxia (HCC) 05/24/2014   Acute renal insufficiency 05/24/2014   Past Medical History:  Past Medical History:  Diagnosis Date   Allergic rhinitis    Anemia    Arrhythmia    CAD (coronary artery disease)    Chest pain    Chronic kidney disease, stage 3a (HCC) 08/11/2023   Combined systolic and diastolic heart failure (HCC)    Diabetes mellitus without complication (HCC)    Diabetic peripheral neuropathy (HCC)    GERD (gastroesophageal reflux disease)    Heart block    Hyperlipidemia    Hypertension    Left bundle branch block    Low back pain    Myocardial infarction (HCC)     Peripheral arterial disease    Retinopathy    Past Surgical History:  Past Surgical History:  Procedure Laterality Date   ABDOMINAL HYSTERECTOMY     LEFT AND RIGHT HEART CATHETERIZATION WITH CORONARY ANGIOGRAM N/A 05/26/2014   Procedure: LEFT AND RIGHT HEART CATHETERIZATION WITH CORONARY ANGIOGRAM;  Surgeon: Salena GORMAN Negri, MD;  Location: [facility] CATH LAB;  Service: Cardiovascular;  Laterality: N/A;   PACEMAKER IMPLANT N/A 08/08/2021   Procedure: PACEMAKER IMPLANT;  Surgeon: Cindie Ole DASEN, MD;  Location: [facility] INVASIVE CV LAB;  Service: Cardiovascular;  Laterality: N/A;   TEMPORARY PACEMAKER N/A 08/07/2021   Procedure: TEMPORARY PACEMAKER;  Surgeon: Levern Hutching, MD;  Location: [facility] INVASIVE CV LAB;  Service: Cardiovascular;  Laterality: N/A;   TRANSCAROTID ARTERY REVASCULARIZATION  Right 09/04/2023   Procedure: Transcarotid Artery Revascularization;  Surgeon: Pearline Norman GORMAN, MD;  Location: [facility] OR;  Service: Vascular;  Laterality: Right;   ULTRASOUND GUIDANCE FOR VASCULAR ACCESS Left 09/04/2023   Procedure: ULTRASOUND GUIDANCE FOR VASCULAR ACCESS;  Surgeon: Pearline Norman GORMAN, MD;  Location: [facility] OR;  Service: Vascular;  Laterality: Left;   HPI:  Pt is 83 yo presenting to [facility] due to abdominal pain. MRI with concerns for metastatic disease per MD note on 12/10. PMH: CVA, carotid stenosis s/p TCAR, chronic a-fib, complete heart block s/p pacemaker placement, DM II, chronic anemia. Cognitive evaluation completed 08/30/24 where patient scored 24/26.   Assessment / Plan / Recommendation Clinical Impression   Patient was evaluated via the Cognistat to assess cognitive linguistic functioning. Patient was evaluated by ST  on 08/30/24 via the SLUMs and patient scored 24/26. This date, patient oriented x4 with intact expressive/receptive language skills, though moderate-severe deficits in problem solving, attention, and memory. Patient with significant difficulty with immediate recall of words/numbers, executive  functioning (calculations, judgement, similarities) and short term memory. Patient aware of deficits and reports change in cognitive functioning in the last two months. Patient would benefit from skilled ST during acute stay and post d/c.     SLP Assessment  SLP Recommendation/Assessment: Patient needs continued Speech Language Pathology Services SLP Visit Diagnosis: Cognitive communication deficit (R41.841)     Assistance Recommended at Discharge  Frequent or constant Supervision/Assistance  Functional Status Assessment Patient has had a recent decline in their functional status and demonstrates the ability to make significant improvements in function in a reasonable and predictable amount of time.  Frequency and Duration min 2x/week  2 weeks      SLP Evaluation Cognition  Overall Cognitive Status: Impaired/Different from baseline Arousal/Alertness: Awake/alert Orientation Level: Oriented X4 Year: 2025 Month: December Day of Week: Correct Attention: Focused;Sustained Focused Attention: Appears intact Sustained Attention: Impaired Sustained Attention Impairment: Verbal basic Memory: Impaired Memory Impairment: Decreased short term memory;Decreased recall of new information;Storage deficit Decreased Short Term Memory: Verbal basic Awareness: Impaired Awareness Impairment: Emergent impairment Problem Solving: Impaired Problem Solving Impairment: Verbal basic Safety/Judgment: Appears intact       Comprehension  Auditory Comprehension Overall Auditory Comprehension: Appears within functional limits for tasks assessed    Expression Expression Primary Mode of Expression: Verbal Verbal Expression Overall Verbal Expression: Appears within functional limits for tasks assessed   Oral / Motor  Motor Speech Overall Motor Speech: Appears within functional limits for tasks assessed            Lyndell Allaire M.A., CCC-SLP 11/10/2024, 9:55 AM

## 2024-11-10 NOTE — Progress Notes (Signed)
 PROGRESS NOTE    Amanda Stewart  FMW:[id number] DOB: 23-Jun-1941 DOA: 11/04/2024 PCP: Shelda Atlas, MD  Subjective: No acute events overnight. Seen and examined at bedside. No new complaints. Tolerating oral intake without n/v. Denies constipation.   Hospital Course: As per H&P: 83 y.o. female with history of stroke with carotid stenosis status post TCAR, chronic atrial fibrillation, complete heart block status post pacemaker placement, diabetes mellitus type 2, chronic anemia brought to the ER after patient was complaining of abdominal pain.  As per patient's daughter with whom I spoke over the telephone patient has been complaining of epigastric discomfort with poor appetite for the last 2 to 3 weeks.  Did not have any nausea vomiting or diarrhea fever or chills.  Patient's amlodipine  was recently discontinued due to low blood pressure.  Gabapentin also was discontinued recently.    Assessment and Plan:  Liver lesions Possible colon cancer Etiology unclear, concern for metastatic disease MRI abdomen showed diffuse liver metastases, short segment annular wall thickening in proximal ascending colon, highly suspicious for primary colon carcinoma, enlarged R pericolonic lymph nodes Status post IR liver biopsy on 12/12 AFP not elevated Viral hepatitis panel negative Follow up biopsy results Will need to follow up with PCP and possibly oncology closely as outpatient   Heartburn/Nausea Improving Etiology unclear,  maybe related to liver lesions, dyspepsia Cont pantoprazole  daily Cont maalox PRN Cont zofran  PRN Encourage oral intake/hydration   Lung lesion MRI abdomen showed new ill-defined T2 hyperintensity in posterior L lower lobe CT chest with no definitive metastatic disease Concern for acute infection low as no respiratory symptoms Monitor clinically   Atrial fibrillation Complete heart block status post pacemaker Cont metoprolol   Cont home eliquis    CVA Due to R ICA  stenosis status post TCAR 05/2023 Stopped heparin  drip as no need to bridge for old CVA Resume home eliquis  Hold home statin given elevated LFTs   Chronic anemia Hgb 7.8 < 8.1, baseline 7-8 Anticoagulation as elsewhere Monitor CBC   T2DM HgbA1c 10.1 Cont SSI with FS ACHS   CKD 3 Cr 1.01 < 1.21, baseline 0.9-1.1 Monitor clinically   HTN Cont metoprolol    Orthostatic hypotension Resolved with IV fluids Encourage oral intake/hydration   Possible cognitive impairment As per daughter, some concerns of memory loss more recently. Is AAOx3 at baseline May need outpatient neuropsychological testing for dementia  DVT prophylaxis: Place and maintain sequential compression device Start: 11/05/24 0410 apixaban  (ELIQUIS ) tablet 5 mg  Eliquis    Code Status: Limited: Do not attempt resuscitation (DNR) -DNR-LIMITED -Do Not Intubate/DNI   Disposition Plan: SNF Reason for continuing need for hospitalization:  medically ready, placement pending, SNF following  Objective: Vitals:   11/09/24 2054 11/10/24 0029 11/10/24 0439 11/10/24 0800  BP: (!) 171/72 (!) 108/94 (!) 152/81 (!) 141/64  Pulse: 69 70 74 70  Resp: 20 20 20 18   Temp: 98.6 F (37 C) 98 F (36.7 C) 98.2 F (36.8 C) 97.7 F (36.5 C)  TempSrc:   Oral Oral  SpO2: 100% 98% 100% 99%  Weight:      Height:        Intake/Output Summary (Last 24 hours) at 11/10/2024 1422 Last data filed at 11/10/2024 0230 Gross per 24 hour  Intake 560 ml  Output 150 ml  Net 410 ml   Filed Weights   11/05/24 0500  Weight: 71.8 kg    Examination:  Physical Exam Vitals and nursing note reviewed.  Constitutional:      General: She  is not in acute distress.    Appearance: She is ill-appearing.     Comments: Weak, frail  HENT:     Head: Normocephalic and atraumatic.  Cardiovascular:     Rate and Rhythm: Normal rate and regular rhythm.     Pulses: Normal pulses.     Heart sounds: Normal heart sounds.  Pulmonary:     Effort:  Pulmonary effort is normal. No respiratory distress.     Breath sounds: Normal breath sounds. No wheezing.  Abdominal:     General: Bowel sounds are normal. There is no distension.     Palpations: Abdomen is soft.     Tenderness: There is no abdominal tenderness.  Neurological:     Mental Status: She is alert.     Data Reviewed: I have personally reviewed following labs and imaging studies  CBC: Recent Labs  Lab 11/05/24 0152 11/06/24 1948 11/07/24 0457 11/08/24 0456 11/09/24 0530  WBC 8.4 7.7 7.7 7.3 8.0  NEUTROABS 6.0  --   --   --   --   HGB 8.1* 7.5* 7.9* 7.8* 8.7*  HCT 27.6* 25.8* 26.9* 26.3* 29.2*  MCV 84.7 84.3 84.3 84.0 82.5  PLT 261 256 242 228 263   Basic Metabolic Panel: Recent Labs  Lab 11/04/24 1542 11/05/24 0152 11/06/24 0716 11/07/24 0457 11/08/24 0456  NA 139 138 139 138 137  K 4.0 3.0* 4.7 3.9 3.9  CL 102 101 100 100 103  CO2 25 27 26 25 30   GLUCOSE 286* 207* 187* 155* 199*  BUN 19 17 17 16 15   CREATININE 1.17* 1.21* 1.01* 1.08* 1.07*  CALCIUM  7.9* 8.3* 8.7* 8.9 8.5*   GFR: Estimated Creatinine Clearance: 37.3 mL/min (A) (by C-G formula based on SCr of 1.07 mg/dL (H)). Liver Function Tests: Recent Labs  Lab 11/04/24 1542 11/05/24 0152 11/06/24 0716 11/07/24 0457 11/08/24 0456  AST 63* 53* 48* 59* 51*  ALT 39 40 42 49* 50*  ALKPHOS 467* 485* 520* 490* 497*  BILITOT 2.1* 0.9 1.2 0.9 0.8  PROT 5.6* 5.9* 5.9* 5.9* 5.6*  ALBUMIN 2.9* 2.9* 2.9* 2.9* 2.7*   Recent Labs  Lab 11/04/24 1542  LIPASE 35   No results for input(s): AMMONIA in the last 168 hours. Coagulation Profile: Recent Labs  Lab 11/07/24 0457  INR 1.1   Cardiac Enzymes: No results for input(s): CKTOTAL, CKMB, CKMBINDEX, TROPONINI in the last 168 hours. ProBNP, BNP (last 5 results) No results for input(s): PROBNP, BNP in the last 8760 hours. HbA1C: No results for input(s): HGBA1C in the last 72 hours. CBG: Recent Labs  Lab 11/09/24 0725  11/09/24 1123 11/09/24 1611 11/09/24 2055 11/10/24 1330  GLUCAP 171* 185* 171* 160* 294*   Lipid Profile: No results for input(s): CHOL, HDL, LDLCALC, TRIG, CHOLHDL, LDLDIRECT in the last 72 hours. Thyroid Function Tests: No results for input(s): TSH, T4TOTAL, FREET4, T3FREE, THYROIDAB in the last 72 hours. Anemia Panel: No results for input(s): VITAMINB12, FOLATE, FERRITIN, TIBC, IRON, RETICCTPCT in the last 72 hours. Sepsis Labs: No results for input(s): PROCALCITON, LATICACIDVEN in the last 168 hours.  No results found for this or any previous visit (from the past 240 hours).   Radiology Studies: No results found.  Scheduled Meds:  apixaban   5 mg Oral BID   aspirin  EC  81 mg Oral Daily   escitalopram   5 mg Oral QHS   insulin  aspart  0-9 Units Subcutaneous TID WC   lidocaine  (PF)  10 mL Other Once  losartan   100 mg Oral Daily   metoprolol  tartrate  25 mg Oral BID   pantoprazole   40 mg Oral Q0600   Continuous Infusions:   LOS: 4 days    Norval Bar, MD  [facility]  11/10/2024, 2:22 PM

## 2024-11-10 NOTE — Progress Notes (Signed)
 Occupational Therapy Treatment Patient Details Name: Amanda Stewart MRN: [id number] DOB: June 18, 1941 Today's Date: Stewart   History of present illness Pt is 83 yo presenting to [facility] due to abdominal pain. MRI with concerns for metastatic disease per MD note on 12/10. PMH: CVA, carotid stenosis s/p TCAR, chronic a-fib, complete heart block s/p pacemaker placement, DM II, chronic anemia.   OT comments  Pt no complaints at rest upon entry, c/o dizziness when standing, quickly sits after short bouts of ambulation. Pt slightly declined from initial eval 1 week ago, min A for bed mobility and ambulation, ambulated 10 feet 3 times, chair follow due to dizziness, which improves when sitting. BP 103/54 sitting, 100/69 when standing. Pt able to complete dressing sitting EOB with set up/CGA, slight LOB when trying to don/doff socks. Pt c/o fatigue, left in bed with alarm on, all needs met. Instructed Pt to call if needed, Pt reports it's hard to get ahold of nurses when she presses the button.Pt would benefit from continued acute OT to maximize participation in ADLs, continue to recommend postacute <3hrs/day.       If plan is discharge home, recommend the following:  A little help with walking and/or transfers;A little help with bathing/dressing/bathroom;Assistance with cooking/housework;Assist for transportation   Equipment Recommendations  Other (comment) (defer)    Recommendations for Other Services      Precautions / Restrictions Precautions Precautions: Fall Recall of Precautions/Restrictions: Impaired Restrictions Weight Bearing Restrictions Per Provider Order: No       Mobility Bed Mobility Overal bed mobility: Needs Assistance Bed Mobility: Supine to Sit, Sit to Supine     Supine to sit: Min assist Sit to supine: Min assist   General bed mobility comments: min A in/out of bed, slightly declined from last week, feeling tired.    Transfers Overall transfer level: Needs  assistance   Transfers: Sit to/from Stand Sit to Stand: Min assist           General transfer comment: min A for STS, gets dizzy quick when ambulating today, 3X10 feet with min a for helping sit due to dizziness.     Balance Overall balance assessment: Needs assistance Sitting-balance support: No upper extremity supported, Feet supported Sitting balance-Leahy Scale: Good     Standing balance support: Bilateral upper extremity supported, During functional activity Standing balance-Leahy Scale: Poor Standing balance comment: reliant on RW, dizzy today, chair follow                           ADL either performed or assessed with clinical judgement   ADL Overall ADL's : Needs assistance/impaired                                       General ADL Comments: set up/CGA, poor activity tolerance, has been getting dizzy when standing limiting OOB activities.    Extremity/Trunk Assessment Upper Extremity Assessment Upper Extremity Assessment: Overall WFL for tasks assessed            Vision       Perception     Praxis     Communication Communication Communication: Impaired Factors Affecting Communication: Hearing impaired   Cognition Arousal: Alert Behavior During Therapy: WFL for tasks assessed/performed Cognition: No family/caregiver present to determine baseline             OT - Cognition Comments: grossly WFLs,  mild safety awareness deficits, HOH                 Following commands: Intact        Cueing   Cueing Techniques: Verbal cues, Gestural cues, Tactile cues, Visual cues  Exercises      Shoulder Instructions       General Comments BP 103/54 sitting, 100/69 standing. Pt reports dizziness when standing, ambulated 3X, 10 feet each, quickly sits due to dizziness. Slightly declined from last week.    Pertinent Vitals/ Pain       Pain Assessment Pain Assessment: No/denies pain  Home Living                                           Prior Functioning/Environment              Frequency  Min 2X/week        Progress Toward Goals  OT Goals(current goals can now be found in the care plan section)  Progress towards OT goals: Progressing toward goals  Acute Rehab OT Goals Patient Stated Goal: to improve activity tolerance OT Goal Formulation: With patient Time For Goal Achievement: 11/20/24 Potential to Achieve Goals: Fair ADL Goals Pt Will Perform Upper Body Dressing: with modified independence Pt Will Perform Lower Body Dressing: with modified independence Pt Will Transfer to Toilet: with modified independence Pt Will Perform Toileting - Clothing Manipulation and hygiene: with modified independence  Plan      Co-evaluation                 AM-PAC OT 6 Clicks Daily Activity     Outcome Measure   Help from another person eating meals?: None Help from another person taking care of personal grooming?: A Little Help from another person toileting, which includes using toliet, bedpan, or urinal?: A Little Help from another person bathing (including washing, rinsing, drying)?: A Little Help from another person to put on and taking off regular upper body clothing?: A Little Help from another person to put on and taking off regular lower body clothing?: A Little 6 Click Score: 19    End of Session Equipment Utilized During Treatment: Gait belt;Rolling walker (2 wheels)  OT Visit Diagnosis: Unsteadiness on feet (R26.81);Other abnormalities of gait and mobility (R26.89);Muscle weakness (generalized) (M62.81);History of falling (Z91.81);Repeated falls (R29.6);Other symptoms and signs involving cognitive function   Activity Tolerance Patient limited by fatigue   Patient Left in bed;with call bell/phone within reach;with bed alarm set   Nurse Communication Mobility status        Time: 8451-8393 OT Time Calculation (min): 18 min  Charges: OT General  Charges $OT Visit: 1 Visit OT Treatments $Self Care/Home Management : 8-22 mins  [place], OTR/L   Amanda Stewart, 4:13 PM

## 2024-11-10 NOTE — Care Management Important Message (Signed)
 Important Message  Patient Details  Name: Amanda Stewart MRN: [id number] Date of Birth: [birth year]   Important Message Given:  Yes - Medicare IM     Claretta Deed 11/10/2024, 3:39 PM

## 2024-11-11 DIAGNOSIS — C787 Secondary malignant neoplasm of liver and intrahepatic bile duct: Secondary | ICD-10-CM

## 2024-11-11 DIAGNOSIS — Z7189 Other specified counseling: Secondary | ICD-10-CM

## 2024-11-11 DIAGNOSIS — C189 Malignant neoplasm of colon, unspecified: Secondary | ICD-10-CM

## 2024-11-11 LAB — GLUCOSE, CAPILLARY
Glucose-Capillary: 218 mg/dL — ABNORMAL HIGH (ref 70–99)
Glucose-Capillary: 269 mg/dL — ABNORMAL HIGH (ref 70–99)
Glucose-Capillary: 172 mg/dL — ABNORMAL HIGH (ref 70–99)
Glucose-Capillary: 191 mg/dL — ABNORMAL HIGH (ref 70–99)

## 2024-11-11 NOTE — TOC Initial Note (Addendum)
 Transition of Care [facility]) - Initial/Assessment Note    Patient Details  Name: Amanda Stewart MRN: [id number] Date of Birth: 11/13/1941  Transition of Care [facility]) CM/SW Contact:    Sherline Clack, LCSWA Phone Number: 11/11/2024, 8:53 AM  Clinical Narrative:                  Update 10:45 AM: CSW called daughter Alfonso over the phone to review updated bed offers. [facility] is unable to offer a bed at this time due to low bed availability, so Alfonso identified [facility] as family's choice for SNF. CSW will accept the facility in the hub and will start insurance authorization once new PT note is in.   CSW attempted to review list of facilities and Medicare ratings with patient at bedside, but patient was confused and requested CSW talk to her daughters. CSW reached out to daughter Alfonso to review list. Alfonso requested a referral be sent to [facility], as [facility] is their preferred facility. CSW sent referral and will reach out in a couple hours to follow up on building acceptance or rejection. CSW will continue to follow.   Expected Discharge Plan: Skilled Nursing Facility Barriers to Discharge: SNF Pending bed offer, Insurance Authorization   Patient Goals and CMS Choice     Choice offered to / list presented to : Patient, Adult Children      Expected Discharge Plan and Services       Living arrangements for the past 2 months: Single Family Home                                      Prior Living Arrangements/Services Living arrangements for the past 2 months: Single Family Home Lives with:: Self Patient language and need for interpreter reviewed:: Yes                 Activities of Daily Living   ADL Screening (condition at time of admission) Independently performs ADLs?: No Does the patient have a NEW difficulty with bathing/dressing/toileting/self-feeding that is expected to last >3 days?: Yes (Initiates electronic notice to provider for possible OT consult) Does  the patient have a NEW difficulty with getting in/out of bed, walking, or climbing stairs that is expected to last >3 days?: Yes (Initiates electronic notice to provider for possible PT consult) Does the patient have a NEW difficulty with communication that is expected to last >3 days?: Yes (Initiates electronic notice to provider for possible SLP consult) Is the patient deaf or have difficulty hearing?: Yes Does the patient have difficulty seeing, even when wearing glasses/contacts?: No Does the patient have difficulty concentrating, remembering, or making decisions?: No  Permission Sought/Granted                  Emotional Assessment Appearance:: Appears stated age Attitude/Demeanor/Rapport: Unable to Assess Affect (typically observed): Unable to Assess Orientation: : Oriented to Self, Oriented to Place      Admission diagnosis:  Liver mass [R16.0] Liver masses [R16.0] Acute cystitis without hematuria [N30.00] Abdominal pain [R10.9] Nausea and vomiting, unspecified vomiting type [R11.2] Patient Active Problem List   Diagnosis Date Noted   Liver mass 11/04/2024   Elevated LFTs 11/04/2024   Abdominal pain 11/04/2024   Acute blood loss anemia 11/11/2023   Increased anion gap metabolic acidosis 11/11/2023   AKI (acute kidney injury) 11/10/2023   Paroxysmal atrial fibrillation (HCC) 10/05/2023   Hypercoagulable state  due to paroxysmal atrial fibrillation (HCC) 10/05/2023   Stroke (HCC) 08/29/2023   Physical deconditioning 08/12/2023   Hypotension 08/11/2023   History of COVID-19 08/11/2023   Chronic kidney disease, stage 3a (HCC) 08/11/2023   Hypertension associated with diabetes (HCC) 08/11/2023   Hyperlipidemia associated with type 2 diabetes mellitus (HCC) 08/11/2023   PAD (peripheral artery disease) 08/11/2023   Pacemaker 11/23/2021   Complete heart block (HCC) 08/07/2021   Hyperkalemia 11/04/2015   Rhabdomyolysis 11/04/2015   CAD (coronary artery disease) 11/04/2015    Transaminitis 11/04/2015   Diabetes mellitus, type 2 (HCC) 11/04/2015   Chest pain 12/10/2014   Chest pain at rest 12/10/2014   Cardiac arrest (HCC) 05/24/2014   Acute respiratory failure with hypoxia (HCC) 05/24/2014   Acute renal insufficiency 05/24/2014   PCP:  Shelda Atlas, MD Pharmacy:   [facility] [phone] - [place], [place] - [address] AT [facility] OF [address] [place] [address] Phone: [phone] Fax: [phone]     Social Drivers of Health (SDOH) Social History: SDOH Screenings   Food Insecurity: No Food Insecurity (11/05/2024)  Housing: Unknown (11/05/2024)  Transportation Needs: No Transportation Needs (11/05/2024)  Utilities: Not At Risk (11/05/2024)  Social Connections: Unknown (11/05/2024)  Tobacco Use: Low Risk (11/04/2024)   SDOH Interventions:     Readmission Risk Interventions     No data to display

## 2024-11-11 NOTE — TOC Progression Note (Signed)
 Transition of Care [facility]) - Progression Note    Patient Details  Name: Amanda Stewart MRN: [id number] Date of Birth: 04/05/1941  [facility]) CM/SW Contact  Sherline Clack, [place] Phone Number: 11/11/2024, 4:51 PM  Clinical Narrative:     Patient's shara is currently pending, auth ID: [id number]. CSW will continue to monitor and update DC plan.   Expected Discharge Plan: Skilled Nursing Facility Barriers to Discharge: SNF Pending bed offer, Insurance Authorization               Expected Discharge Plan and Services       Living arrangements for the past 2 months: Single Family Home                                       Social Drivers of Health (SDOH) Interventions SDOH Screenings   Food Insecurity: No Food Insecurity (11/05/2024)  Housing: Unknown (11/05/2024)  Transportation Needs: No Transportation Needs (11/05/2024)  Utilities: Not At Risk (11/05/2024)  Social Connections: Unknown (11/05/2024)  Tobacco Use: Low Risk (11/04/2024)    Readmission Risk Interventions     No data to display

## 2024-11-11 NOTE — Progress Notes (Signed)
 PROGRESS NOTE    Amanda Stewart  FMW:[id number] DOB: 1941/06/09 DOA: 11/04/2024 PCP: Shelda Atlas, MD  Subjective: No acute events overnight. Seen and examined at bedside. No new complaints. Feeling better. Tolerating oral intake without n/v. Denies constipation.   Hospital Course: As per H&P: 83 y.o. female with history of stroke with carotid stenosis status post TCAR, chronic atrial fibrillation, complete heart block status post pacemaker placement, diabetes mellitus type 2, chronic anemia brought to the ER after patient was complaining of abdominal pain.  As per patient's daughter with whom I spoke over the telephone patient has been complaining of epigastric discomfort with poor appetite for the last 2 to 3 weeks.  Did not have any nausea vomiting or diarrhea fever or chills.  Patient's amlodipine  was recently discontinued due to low blood pressure.  Gabapentin also was discontinued recently.    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 Will get oncology consult, paged service and sent secure chat to on-call provider, awaiting call back for recommendations   Heartburn/Nausea Improving Etiology unclear,  maybe related to liver lesions, dyspepsia Cont pantoprazole  daily Cont maalox PRN Cont zofran  PRN Encourage oral intake/hydration   Lung lesion MRI abdomen showed new ill-defined T2 hyperintensity in posterior L lower lobe CT chest with no definitive metastatic disease Concern for acute infection low as no respiratory  symptoms Monitor clinically   Atrial fibrillation Complete heart block status post pacemaker Cont metoprolol   Cont home eliquis    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 Stopped heparin  drip as no need to bridge for old CVA Resume home eliquis  Hold home statin given elevated LFTs   Chronic anemia Hgb 7.8 < 8.1, baseline 7-8 Anticoagulation as elsewhere Monitor CBC   T2DM HgbA1c 10.1 Cont SSI with FS ACHS   CKD 3 Cr 1.01 < 1.21, baseline 0.9-1.1 Monitor clinically   HTN Cont metoprolol    Orthostatic hypotension Resolved with IV fluids Encourage oral intake/hydration   Possible cognitive impairment As per daughter, some concerns of memory loss more recently. Is AAOx3 at baseline May need outpatient neuropsychological testing for dementia  DVT prophylaxis: Place and maintain sequential compression device Start: 11/05/24 0410 apixaban  (ELIQUIS ) tablet 5 mg  Eliquis    Code Status: Limited: Do not attempt resuscitation (DNR) -DNR-LIMITED -Do Not Intubate/DNI  Family Communication: updated daughter by phone Disposition Plan: SNF Reason for continuing need for hospitalization: medically ready, SNF pending, SW following  Objective: Vitals:   11/10/24 2350 11/11/24 0410 11/11/24 0737 11/11/24 1244  BP: (!) 125/97 120/66 (!) 143/58 (!) 89/63  Pulse: 69 70 70 70  Resp: 20 20 18 20   Temp: 98.3 F (36.8 C) 98.1 F (36.7 C) 97.9 F (36.6 C) 97.8 F (36.6 C)  TempSrc: Oral Oral Oral Oral  SpO2: 100% 100% 98% 100%  Weight:      Height:        Intake/Output Summary (Last 24 hours) at 11/11/2024 1428 Last data filed at 11/11/2024 0413 Gross per 24 hour  Intake --  Output 500 ml  Net -500 ml   Filed Weights   11/05/24 0500  Weight: 71.8 kg  Examination:  Physical Exam Vitals and nursing note reviewed.  Constitutional:      General: She is not in acute distress.    Appearance: She is ill-appearing.     Comments: Weak, frail  HENT:     Head:  Normocephalic and atraumatic.  Cardiovascular:     Rate and Rhythm: Normal rate and regular rhythm.     Pulses: Normal pulses.     Heart sounds: Normal heart sounds.  Pulmonary:     Effort: Pulmonary effort is normal. No respiratory distress.     Breath sounds: Normal breath sounds. No wheezing.  Abdominal:     General: Bowel sounds are normal.     Palpations: Abdomen is soft.  Neurological:     Mental Status: She is alert. Mental status is at baseline.     Data Reviewed: I have personally reviewed following labs and imaging studies  CBC: Recent Labs  Lab 11/05/24 0152 11/06/24 1948 11/07/24 0457 11/08/24 0456 11/09/24 0530  WBC 8.4 7.7 7.7 7.3 8.0  NEUTROABS 6.0  --   --   --   --   HGB 8.1* 7.5* 7.9* 7.8* 8.7*  HCT 27.6* 25.8* 26.9* 26.3* 29.2*  MCV 84.7 84.3 84.3 84.0 82.5  PLT 261 256 242 228 263   Basic Metabolic Panel: Recent Labs  Lab 11/04/24 1542 11/05/24 0152 11/06/24 0716 11/07/24 0457 11/08/24 0456  NA 139 138 139 138 137  K 4.0 3.0* 4.7 3.9 3.9  CL 102 101 100 100 103  CO2 25 27 26 25 30   GLUCOSE 286* 207* 187* 155* 199*  BUN 19 17 17 16 15   CREATININE 1.17* 1.21* 1.01* 1.08* 1.07*  CALCIUM  7.9* 8.3* 8.7* 8.9 8.5*   GFR: Estimated Creatinine Clearance: 37.3 mL/min (A) (by C-G formula based on SCr of 1.07 mg/dL (H)). Liver Function Tests: Recent Labs  Lab 11/04/24 1542 11/05/24 0152 11/06/24 0716 11/07/24 0457 11/08/24 0456  AST 63* 53* 48* 59* 51*  ALT 39 40 42 49* 50*  ALKPHOS 467* 485* 520* 490* 497*  BILITOT 2.1* 0.9 1.2 0.9 0.8  PROT 5.6* 5.9* 5.9* 5.9* 5.6*  ALBUMIN 2.9* 2.9* 2.9* 2.9* 2.7*   Recent Labs  Lab 11/04/24 1542  LIPASE 35   No results for input(s): AMMONIA in the last 168 hours. Coagulation Profile: Recent Labs  Lab 11/07/24 0457  INR 1.1   Cardiac Enzymes: No results for input(s): CKTOTAL, CKMB, CKMBINDEX, TROPONINI in the last 168 hours. ProBNP, BNP (last 5 results) No results for input(s):  PROBNP, BNP in the last 8760 hours. HbA1C: No results for input(s): HGBA1C in the last 72 hours. CBG: Recent Labs  Lab 11/10/24 1330 11/10/24 1712 11/10/24 2041 11/11/24 0808 11/11/24 1159  GLUCAP 294* 185* 241* 218* 269*   Lipid Profile: No results for input(s): CHOL, HDL, LDLCALC, TRIG, CHOLHDL, LDLDIRECT in the last 72 hours. Thyroid Function Tests: No results for input(s): TSH, T4TOTAL, FREET4, T3FREE, THYROIDAB in the last 72 hours. Anemia Panel: No results for input(s): VITAMINB12, FOLATE, FERRITIN, TIBC, IRON, RETICCTPCT in the last 72 hours. Sepsis Labs: No results for input(s): PROCALCITON, LATICACIDVEN in the last 168 hours.  No results found for this or any previous visit (from the past 240 hours).   Radiology Studies: No results found.  Scheduled Meds:  apixaban   5 mg Oral BID   aspirin  EC  81 mg Oral Daily   escitalopram   5 mg Oral QHS   insulin  aspart  0-9 Units Subcutaneous TID WC  lidocaine  (PF)  10 mL Other Once   losartan   100 mg Oral Daily   metoprolol  tartrate  25 mg Oral BID   pantoprazole   40 mg Oral Q0600   Continuous Infusions:   LOS: 5 days   Norval Bar, MD  [facility]  11/11/2024, 2:28 PM

## 2024-11-11 NOTE — Progress Notes (Signed)
 Physical Therapy Treatment Patient Details Name: Amanda Stewart MRN: [id number] DOB: 03/04/1941 Today's Date: 11/11/2024   History of Present Illness Pt is 83 yo presenting to [facility] due to abdominal pain. MRI with concerns for metastatic disease per MD note on 12/10. PMH: CVA, carotid stenosis s/p TCAR, chronic a-fib, complete heart block s/p pacemaker placement, DM II, chronic anemia.    PT Comments  Pt slowly progressing towards goals. Currently pt is Min A for sit to stand, bed mobility and CGA to Min A for very short non-functional distance gait with RW. Pt requires safety cues throughout for hand placement and sequencing. Pt is limited by fatigue/weakness and dizziness with activity; currently no noted orthostatic BP (see OT note). Due to pt current functional status, home set up and available assistance at home recommending skilled physical therapy services < 3 hours/day in order to address strength, balance and functional mobility to decrease risk for falls, injury, immobility, skin break down and re-hospitalization.      If plan is discharge home, recommend the following: Assistance with cooking/housework;Assist for transportation;Help with stairs or ramp for entrance   Can travel by private vehicle     Yes  Equipment Recommendations  Rolling walker (2 wheels);BSC/3in1       Precautions / Restrictions Precautions Precautions: Fall Recall of Precautions/Restrictions: Impaired     Mobility  Bed Mobility Overal bed mobility: Needs Assistance Bed Mobility: Sit to Supine       Sit to supine: Min assist   General bed mobility comments: Min A for LE back to bed.    Transfers Overall transfer level: Needs assistance Equipment used: Rolling walker (2 wheels) Transfers: Bed to chair/wheelchair/BSC, Sit to/from Stand Sit to Stand: Min assist           General transfer comment: Min A for sit to stand from recliner, EOB and BSC. Pt performed 4x total during session. Pt reports  dizziness in standing and with gait. Unable to get standing BP due to fatigue/dizziness.    Ambulation/Gait Ambulation/Gait assistance: Contact guard assist, Min assist Gait Distance (Feet): 15 Feet Assistive device: Rolling walker (2 wheels) Gait Pattern/deviations: Step-through pattern, Decreased stride length, Narrow base of support Gait velocity: decreased Gait velocity interpretation: <1.31 ft/sec, indicative of household ambulator   General Gait Details: Narrow BOS, Min A for navigation of AD> Pt reports dizziness in standing, unable to stand longer than 3 min      Balance Overall balance assessment: Needs assistance Sitting-balance support: No upper extremity supported, Feet supported Sitting balance-Leahy Scale: Fair     Standing balance support: Bilateral upper extremity supported, During functional activity Standing balance-Leahy Scale: Poor Standing balance comment: reliant on RW, dizzy      Communication Communication Communication: Impaired Factors Affecting Communication: Hearing impaired  Cognition Arousal: Alert Behavior During Therapy: WFL for tasks assessed/performed   PT - Cognitive impairments: No family/caregiver present to determine baseline, Safety/Judgement, Problem solving   Following commands: Intact      Cueing Cueing Techniques: Verbal cues, Gestural cues, Tactile cues, Visual cues     General Comments General comments (skin integrity, edema, etc.): unable to get standing BP after gait due to fatigue/dizziness. BP in sitting after gait 125/60. HR 77 bpm and O2 sats mid to upp 90's      Pertinent Vitals/Pain Pain Assessment Pain Assessment: No/denies pain     PT Goals (current goals can now be found in the care plan section) Acute Rehab PT Goals Patient Stated Goal: to go home  PT Goal Formulation: With patient Time For Goal Achievement: 11/20/24 Potential to Achieve Goals: Fair Progress towards PT goals: Progressing toward goals     Frequency    Min 2X/week      PT Plan  Continue with current POC        AM-PAC PT 6 Clicks Mobility   Outcome Measure  Help needed turning from your back to your side while in a flat bed without using bedrails?: A Little Help needed moving from lying on your back to sitting on the side of a flat bed without using bedrails?: A Little Help needed moving to and from a bed to a chair (including a wheelchair)?: A Little Help needed standing up from a chair using your arms (e.g., wheelchair or bedside chair)?: A Little Help needed to walk in hospital room?: A Lot Help needed climbing 3-5 steps with a railing? : Total 6 Click Score: 15    End of Session Equipment Utilized During Treatment: Gait belt Activity Tolerance: Patient tolerated treatment well;Patient limited by fatigue Patient left: in bed;with call bell/phone within reach;with bed alarm set Nurse Communication: Mobility status PT Visit Diagnosis: Unsteadiness on feet (R26.81);Other abnormalities of gait and mobility (R26.89);Muscle weakness (generalized) (M62.81);Repeated falls (R29.6)     Time: 1433-1500 PT Time Calculation (min) (ACUTE ONLY): 27 min  Charges:    $Therapeutic Activity: 23-37 mins PT General Charges $$ ACUTE PT VISIT: 1 Visit                    Dorothyann Maier, DPT, CLT  Acute Rehabilitation Services Office: [phone] (Secure chat preferred)    Dorothyann VEAR Maier 11/11/2024, 3:11 PM

## 2024-11-11 NOTE — Consult Note (Signed)
 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 female who has been referred to us  by Dr Cosette for evaluation and management of newly diagnosed metastatic colon cancer.  Per discussion with daughters and chart review, the patient experienced 2-3 weeks of epigastric discomfort, anorexia, and functional decline without associated fevers, vomiting, or bowel changes. Evaluation revealed abnormal liver function tests and imaging concerning for metastatic malignancy.  MRI Abdomen with and without contrast (11/05/2024):  Numerous heterogeneous hypovascular masses throughout the liver consistent with diffuse hepatic metastases; largest lesion in the lateral right hepatic lobe measuring 5.2  4.1 cm.  Short-segment annular wall thickening (~5 cm) in the proximal ascending colon, highly suspicious for primary colon carcinoma.  Several borderline enlarged right pericolonic lymph nodes, suspicious for locoregional nodal metastases.  Gallbladder sludge without cholecystitis; no biliary ductal dilatation.  Focal ill-defined T2 hyperintensity in the posterior left lower lobe, new compared with prior CT, suspicious for infectious or inflammatory etiology.  CT Chest without contrast (11/05/2024):  Patchy ground-glass and subsolid opacities in the left lower lobe, favored infectious or inflammatory; no clinical features to suggest active pneumonia at this time.  No mediastinal,  hilar, or axillary lymphadenopathy.  No osseous lesions or convincing evidence of thoracic metastatic disease.  Findings again confirm extensive hepatic metastatic disease.  Pathology:  Liver biopsy (11/07/2024): Metastatic moderately differentiated adenocarcinoma with extensive mucinous features, morphologically consistent with colorectal primary.  Patient notes fatigue and just not feeling well. Has had issues with memory. Daughter helps her with most of her ADLs those she lives independently currently.  MEDICAL HISTORY:  Past Medical History:  Diagnosis Date   Allergic rhinitis    Anemia    Arrhythmia    CAD (coronary artery disease)    Chest pain    Chronic kidney disease, stage 3a (HCC) 08/11/2023   Combined systolic and diastolic heart failure (HCC)    Diabetes mellitus without complication (HCC)    Diabetic peripheral neuropathy (HCC)    GERD (gastroesophageal reflux disease)    Heart block    Hyperlipidemia    Hypertension    Left bundle branch block    Low back pain    Myocardial infarction (HCC)    Peripheral arterial disease    Retinopathy     SURGICAL HISTORY: Past Surgical History:  Procedure Laterality Date   ABDOMINAL HYSTERECTOMY     LEFT AND RIGHT HEART CATHETERIZATION WITH CORONARY ANGIOGRAM N/A 05/26/2014   Procedure: LEFT AND RIGHT HEART CATHETERIZATION WITH CORONARY ANGIOGRAM;  Surgeon: Pacific GORMAN Claudene, MD;  Location: [facility] CATH LAB;  Service: Cardiovascular;  Laterality: N/A;   PACEMAKER IMPLANT N/A 08/08/2021   Procedure: PACEMAKER IMPLANT;  Surgeon: Cindie Ole DASEN, MD;  Location: [facility] INVASIVE CV LAB;  Service: Cardiovascular;  Laterality: N/A;   TEMPORARY PACEMAKER N/A 08/07/2021   Procedure: TEMPORARY PACEMAKER;  Surgeon: Levern Hutching, MD;  Location: [facility] INVASIVE CV LAB;  Service: Cardiovascular;  Laterality: N/A;   TRANSCAROTID ARTERY REVASCULARIZATION  Right 09/04/2023   Procedure: Transcarotid Artery Revascularization;  Surgeon: Pearline Norman GORMAN, MD;  Location: [facility] OR;  Service: Vascular;  Laterality: Right;   ULTRASOUND GUIDANCE FOR VASCULAR ACCESS Left 09/04/2023   Procedure: ULTRASOUND GUIDANCE FOR VASCULAR ACCESS;  Surgeon: Pearline Norman RAMAN, MD;  Location: [facility] OR;  Service: Vascular;  Laterality: Left;    SOCIAL HISTORY: Social History   Socioeconomic History   Marital status: Married    Spouse name: Not on file   Number of children: Not on file   Years of education: Not on file   Highest education level: Not on file  Occupational History   Not on file  Tobacco Use   Smoking status: Never   Smokeless tobacco: Never   Tobacco comments:    Never smoked 10/05/23  Vaping Use   Vaping status: Never Used  Substance and Sexual Activity   Alcohol use: Yes    Alcohol/week: 1.0 standard drink of alcohol    Types: 1 Glasses of wine per week    Comment: 1 glass of wine once a month 10/05/23   Drug use: No   Sexual activity: Not on file  Other Topics Concern   Not on file  Social History Narrative   Not on file   Social Drivers of Health   Tobacco Use: Low Risk (11/04/2024)   Patient History    Smoking Tobacco Use: Never    Smokeless Tobacco Use: Never    Passive Exposure: Not on file  Financial Resource Strain: Not on file  Food Insecurity: No Food Insecurity (11/05/2024)   Epic    Worried About Programme Researcher, Broadcasting/film/video in the Last Year: Never true    Ran Out of Food in the Last Year: Never true  Transportation Needs: No Transportation Needs (11/05/2024)   Epic    Lack of Transportation (Medical): No    Lack of Transportation (Non-Medical): No  Physical Activity: Not on file  Stress: Not on file  Social Connections: Unknown (11/05/2024)   Social Connection and Isolation Panel    Frequency of Communication with Friends and Family: Twice a week    Frequency of Social Gatherings with Friends and Family: Once a week    Attends Religious Services: 1 to 4 times per year    Active Member of Clubs or Organizations: Patient  unable to answer    Attends Banker Meetings: Never    Marital Status: Patient declined  Intimate Partner Violence: Unknown (11/05/2024)   Epic    Fear of Current or Ex-Partner: No    Emotionally Abused: No    Physically Abused: No    Sexually Abused: Patient declined  Depression (PHQ2-9): Not on file  Alcohol Screen: Not on file  Housing: Unknown (11/05/2024)   Epic    Unable to Pay for Housing in the Last Year: No    Number of Times Moved in the Last Year: 0    Homeless in the Last Year: Patient declined  Utilities: Not At Risk (11/05/2024)   Epic    Threatened with loss of utilities: No  Health Literacy: Not on file    FAMILY HISTORY: Family History  Problem Relation Age of Onset   Diabetes Mother    Diabetes Father    Stroke Father     ALLERGIES:  is allergic to metformin  and related.  MEDICATIONS:  Current Facility-Administered Medications  Medication Dose Route Frequency Provider Last Rate Last Admin   acetaminophen  (TYLENOL ) tablet 650 mg  650 mg Oral Q6H PRN Tariq, Hassan, MD   650 mg at 11/10/24 2044   alum &  mag hydroxide-simeth (MAALOX/MYLANTA) 200-200-20 MG/5ML suspension 15 mL  15 mL Oral Q6H PRN Tariq, Hassan, MD   15 mL at 11/08/24 1934   apixaban  (ELIQUIS ) tablet 5 mg  5 mg Oral BID Tariq, Hassan, MD   5 mg at 11/11/24 2109   aspirin  EC tablet 81 mg  81 mg Oral Daily Tariq, Hassan, MD   81 mg at 11/11/24 9071   escitalopram  (LEXAPRO ) tablet 5 mg  5 mg Oral QHS Tariq, Hassan, MD   5 mg at 11/11/24 2109   hydrALAZINE  (APRESOLINE ) injection 5 mg  5 mg Intravenous Q4H PRN Rathore, Vasundhra, MD   5 mg at 11/09/24 0413   insulin  aspart (novoLOG ) injection 0-9 Units  0-9 Units Subcutaneous TID WC Cosette Blackwater, MD   2 Units at 11/11/24 1658   lidocaine  (PF) (XYLOCAINE ) 1 % injection 10 mL  10 mL Other Once Karalee Wilkie POUR, MD       losartan  (COZAAR ) tablet 100 mg  100 mg Oral Daily Tariq, Hassan, MD   100 mg at 11/11/24 9071   metoprolol  tartrate  (LOPRESSOR ) tablet 25 mg  25 mg Oral BID Franky Redia SAILOR, MD   25 mg at 11/11/24 2109   naloxone  (NARCAN ) injection 0.4 mg  0.4 mg Intravenous PRN Rathore, Vasundhra, MD       ondansetron  (ZOFRAN -ODT) disintegrating tablet 4 mg  4 mg Oral Q8H PRN Cosette Blackwater, MD       Or   ondansetron  (ZOFRAN ) injection 4 mg  4 mg Intravenous Q8H PRN Cosette Blackwater, MD       oxyCODONE  (Oxy IR/ROXICODONE ) immediate release tablet 5 mg  5 mg Oral Once PRN Rathore, Vasundhra, MD       pantoprazole  (PROTONIX ) EC tablet 40 mg  40 mg Oral Q0600 Kakrakandy, Arshad N, MD   40 mg at 11/11/24 9461    REVIEW OF SYSTEMS:    10 Point review of Systems was done is negative except as noted above.  PHYSICAL EXAMINATION: ECOG PERFORMANCE STATUS: 3 - Symptomatic, >50% confined to bed  . Vitals:   11/11/24 1840 11/11/24 2107  BP: (!) 120/53 127/74  Pulse: 70 70  Resp: 19   Temp: 98.3 F (36.8 C) 98 F (36.7 C)  SpO2: 100% 100%   Filed Weights   11/05/24 0500  Weight: 158 lb 4.6 oz (71.8 kg)   .Body mass index is 27.17 kg/m.  GENERAL:alert, frail appearing 83 yo woman SKIN: no acute rashes, no significant lesions EYES: conjunctival pallor + OROPHARYNX: MMM, no exudates, no oropharyngeal erythema or ulceration NECK: supple, no JVD LYMPH:  no palpable lymphadenopathy in the cervical, axillary or inguinal regions LUNGS: clear to auscultation b/l with normal respiratory effort HEART: regular rate & rhythm ABDOMEN:  normoactive bowel sounds , non tender, not distended. Extremity: no pedal edema PSYCH: alert & oriented x 3  NEURO: no focal motor/sensory deficits  LABORATORY DATA:  I have reviewed the data as listed  .    Latest Ref Rng & Units 11/09/2024    5:30 AM 11/08/2024    4:56 AM 11/07/2024    4:57 AM  CBC  WBC 4.0 - 10.5 K/uL 8.0  7.3  7.7   Hemoglobin 12.0 - 15.0 g/dL 8.7  7.8  7.9   Hematocrit 36.0 - 46.0 % 29.2  26.3  26.9   Platelets 150 - 400 K/uL 263  228  242     .     Latest Ref Rng & Units 11/08/2024  4:56 AM 11/07/2024    4:57 AM 11/06/2024    7:16 AM  CMP  Glucose 70 - 99 mg/dL 800  844  812   BUN 8 - 23 mg/dL 15  16  17    Creatinine 0.44 - 1.00 mg/dL 8.92  8.91  8.98   Sodium 135 - 145 mmol/L 137  138  139   Potassium 3.5 - 5.1 mmol/L 3.9  3.9  4.7   Chloride 98 - 111 mmol/L 103  100  100   CO2 22 - 32 mmol/L 30  25  26    Calcium  8.9 - 10.3 mg/dL 8.5  8.9  8.7   Total Protein 6.5 - 8.1 g/dL 5.6  5.9  5.9   Total Bilirubin 0.0 - 1.2 mg/dL 0.8  0.9  1.2   Alkaline Phos 38 - 126 U/L 497  490  520   AST 15 - 41 U/L 51  59  48   ALT 0 - 44 U/L 50  49  42    . Lab Results  Component Value Date   IRON 52 05/31/2014   TIBC 238 (L) 05/31/2014   IRONPCTSAT 22 05/31/2014   (Iron and TIBC)  Lab Results  Component Value Date   FERRITIN 546 (H) 05/29/2014     RADIOGRAPHIC STUDIES: I have personally reviewed the radiological images as listed and agreed with the findings in the report. US  BIOPSY (LIVER) Result Date: 11/07/2024 INDICATION: 83 year old female with extensive metastatic hepatic disease. She presents for ultrasound-guided core biopsy to confirm tissue diagnosis. EXAM: ULTRASOUND BIOPSY CORE LIVER MEDICATIONS: None. ANESTHESIA/SEDATION: Moderate (conscious) sedation was employed during this procedure. A total of Versed  1 mg and Fentanyl  50 mcg was administered intravenously by the Radiology nurse. Moderate Sedation Time: 11 minutes. The patient's level of consciousness and vital signs were monitored continuously by radiology nursing throughout the procedure under my direct supervision. FLUOROSCOPY TIME:  None. COMPLICATIONS: None immediate. PROCEDURE: Informed written consent was obtained from the patient after a thorough discussion of the procedural risks, benefits and alternatives. All questions were addressed. Maximal Sterile Barrier Technique was utilized including caps, mask, sterile gowns, sterile gloves, sterile drape, hand hygiene  and skin antiseptic. A timeout was performed prior to the initiation of the procedure. The right upper quadrant was interrogated with ultrasound. Multiple hypoechoic solid masses. A suitable skin entry site was selected and marked. The region was sterilely prepped and draped in the standard fashion using chlorhexidine  skin prep. Local anesthesia was attained by infiltration with 1% lidocaine . A small dermatotomy was made. Under real-time sonographic guidance, a 17 gauge introducer needle was advanced into the margin of the mass. Multiple 18 gauge biopsies were then coaxially obtained using the BioPince automated biopsy device. Biopsy samples were placed in formalin and delivered to pathology for further analysis. As a 17 gauge introducer needle was removed, the biopsy tract was embolized with a Gel-Foam slurry. Post biopsy ultrasound imaging demonstrates no evidence of active hemorrhage or perihepatic hematoma. The patient tolerated the procedure well. IMPRESSION: Technically successful ultrasound-guided core biopsy of hepatic lesion. Electronically Signed   By: Wilkie Lent M.D.   On: 11/07/2024 12:53   CT CHEST WO CONTRAST Result Date: 11/05/2024 EXAM: CT CHEST WITHOUT CONTRAST 11/05/2024 12:37:11 PM TECHNIQUE: CT of the chest was performed without the administration of intravenous contrast. Multiplanar reformatted images are provided for review. Automated exposure control, iterative reconstruction, and/or weight based adjustment of the mA/kV was utilized to reduce the radiation dose to as low as  reasonably achievable. COMPARISON: CT of the abdomen and pelvis dated 11/04/2024. CLINICAL HISTORY: Metastasis evaluation, Patient with liver lesions/metastases and possible colon cancer, evaluate Left lung lesion/hyperdensity. FINDINGS: MEDIASTINUM: Heart: Normal in size. Moderate calcific coronary artery disease. A cardiac pacemaker is present. Pericardium is unremarkable. The central airways are clear. The  thoracic aorta demonstrates moderate calcific atheromatous disease. There is a vascular stent within the right common carotid artery. LYMPH NODES: No mediastinal, hilar or axillary lymphadenopathy. LUNGS AND PLEURA: There is a patchy area of ground-glass and subsolid opacities present posteromedially within the left lower lobe on images 80 through 88 of series 3, which appeared most likely to be infectious or inflammatory. There are streaky linear opacities present medially within the right lower lobe, likely representing compressive atelectasis from the thoracic osteophytes. There is minimal dependent atelectasis. The lungs are clear otherwise. No pleural effusion or pneumothorax. SOFT TISSUES/BONES: No acute abnormality of the bones or soft tissues. There are no osseous lesions concerning for metastatic disease. UPPER ABDOMEN: Numerous lesions again demonstrated within the liver highly concerning for hepatic metastatic disease. Limited images of the upper abdomen demonstrates no acute abnormality. IMPRESSION: 1. Patchy ground-glass and subsolid opacities in the left lower lobe, likely infectious or inflammatory. Attention on follow up is suggested. 2. Streaky linear opacities in the right lower lobe, likely compressive atelectasis from thoracic osteophytes. 3. Numerous liver lesions highly concerning for hepatic metastatic disease. 4. No thoracic lymphadenopathy or osseous lesions to suggest metastatic disease; no convincing evidence of thoracic metastatic disease. Electronically signed by: Evalene Coho MD 11/05/2024 01:04 PM EST RP Workstation: [id number]   MR LIVER W WO CONTRAST Result Date: 11/05/2024 CLINICAL DATA:  Abdominal pain. Elevated bilirubin. Hepatic masses on recent CT. EXAM: MRI ABDOMEN WITHOUT AND WITH CONTRAST TECHNIQUE: Multiplanar multisequence MR imaging of the abdomen was performed both before and after the administration of intravenous contrast. CONTRAST:  7mL GADAVIST  GADOBUTROL  1  MMOL/ML IV SOLN COMPARISON:  CT on 11/04/2024 FINDINGS: Lower chest: Focal ill-defined T2 hyperintensity is seen in the posterior left lower lobe which is new since recent CT, suspicious for infectious or inflammatory etiology. Hepatobiliary: Numerous heterogeneous hypovascular masses are seen throughout the liver, consistent with diffuse liver metastases. Largest index mass in the lateral right hepatic lobe measures 5.2 x 4.1 cm. T1 hyperintense gallbladder sludge is seen, without signs of cholecystitis. No evidence of biliary ductal dilatation. Pancreas:  No mass or inflammatory changes. Spleen:  Within normal limits in size and appearance. Adrenals/Urinary Tract: Tiny benign-appearing Bosniak category 2 left renal cyst noted (No followup imaging is recommended) . No suspicious masses identified. No evidence of hydronephrosis. Stomach/Bowel: A short segment area of annular wall thickening is seen in the proximal ascending colon measuring approximately 5 cm in length, highly suspicious for primary colon carcinoma. No evidence of bowel obstruction. Vascular/Lymphatic: Several borderline enlarged right pericolonic lymph nodes are also seen, suspicious for local lymph node metastases. No other sites of lymphadenopathy identified. No acute vascular findings. Other:  None. Musculoskeletal:  No suspicious bone lesions identified. IMPRESSION: Diffuse liver metastases. Short segment annular wall thickening in the proximal ascending colon, highly suspicious for primary colon carcinoma. Several borderline enlarged right pericolonic lymph nodes, suspicious for local lymph node metastases. Focal ill-defined T2 hyperintensity in the posterior left lower lobe, new since recent CT, suspicious for pneumonia or other inflammatory etiology. Consider further evaluation with chest radiographs or chest CT. Electronically Signed   By: Norleen DELENA Kil M.D.   On: 11/05/2024 11:11   CT  ABDOMEN PELVIS W CONTRAST Result Date:  11/04/2024 EXAM: CT ABDOMEN AND PELVIS WITH CONTRAST 11/04/2024 08:23:52 PM TECHNIQUE: CT of the abdomen and pelvis was performed with the administration of intravenous contrast (75 mL iohexol  (OMNIPAQUE ) 350 MG/ML injection). Multiplanar reformatted images are provided for review. Automated exposure control, iterative reconstruction, and/or weight-based adjustment of the mA/kV was utilized to reduce the radiation dose to as low as reasonably achievable. COMPARISON: None available. CLINICAL HISTORY: abd pain, elevated bilirubin, vomiting FINDINGS: LOWER CHEST: Aortic valve leaflet calcification. Partially visualized cardiac implantable electronic device (defibrillator). LIVER: Multiple lobulated fluid density lesions within the liver that appears slightly heterogeneous centrally with some vascularity running through the lesions. GALLBLADDER AND BILE DUCTS: Gallbladder is unremarkable. No biliary ductal dilatation. SPLEEN: Multiple subcentimeter splenules noted. The spleen is unremarkable. PANCREAS: No acute abnormality. ADRENAL GLANDS: Bilateral adrenal hyperplasia with no definite nodularity. KIDNEYS, URETERS AND BLADDER: No stones in the kidneys or ureters. No hydronephrosis. No perinephric or periureteral stranding. Urinary bladder is unremarkable. GI AND BOWEL: Small hiatal hernia. No small or large bowel thickening or dilatation. The appendix is unremarkable. PERITONEUM AND RETROPERITONEUM: No ascites. No free air. VASCULATURE: Severe atherosclerotic plaque of the aorta. LYMPH NODES: No lymphadenopathy. REPRODUCTIVE ORGANS: No acute abnormality. BONES AND SOFT TISSUES: Multilevel degenerative changes of the spine. No acute osseous abnormality. No focal soft tissue abnormality. IMPRESSION: 1. No acute findings in the abdomen or pelvis. 2. Multiple lobulated fluid-density hepatic lesions, slightly heterogeneous centrally with internal vascularity; recommend dedicated liver MRI with contrast for characterization.  3. Other, non-acute and/or normal findings as above. Electronically signed by: Kate Plummer MD 11/04/2024 08:34 PM EST RP Workstation: [id number]   US  Abdomen Limited Result Date: 11/04/2024 EXAM: Right Upper Quadrant Abdominal Ultrasound 11/04/2024 07:37:00 PM TECHNIQUE: Real-time ultrasonography of the right upper quadrant of the abdomen was performed. COMPARISON: None available. CLINICAL HISTORY: RUQ US  RUQ US  FINDINGS: LIVER: The liver demonstrates normal echogenicity. No intrahepatic biliary ductal dilatation. Multiple hypoechoic lesions noted in the liver, particularly the left hepatic lobe measuring up to 2.4 cm. These appear to reflect solid mass lesions. BILIARY SYSTEM: No pericholecystic fluid or wall thickening. No cholelithiasis. Common bile duct is within normal limits measuring 4 mm. OTHER: No right upper quadrant ascites. IMPRESSION: 1. Multiple hypoechoic solid liver masses, up to 2.4 cm, suspicious for hepatic neoplasm; recommend dedicated multiphasic liver MRI or contrast-enhanced CT for further characterization. Electronically signed by: Franky Crease MD 11/04/2024 07:40 PM EST RP Workstation: [id number]   DG Chest 1 View Result Date: 11/04/2024 EXAM: 1 VIEW(S) XRAY OF THE CHEST 11/04/2024 06:13:00 PM COMPARISON: 11/10/2023. CLINICAL HISTORY: cough FINDINGS: LUNGS AND PLEURA: No focal pulmonary opacity. No pleural effusion. No pneumothorax. HEART AND MEDIASTINUM: Left pacer remains in place, unchanged. BONES AND SOFT TISSUES: No acute osseous abnormality. IMPRESSION: 1. No acute cardiopulmonary process. 2. Left pacer in place without interval change. Electronically signed by: Franky Crease MD 11/04/2024 06:34 PM EST RP Workstation: [id number]   CUP PACEART REMOTE DEVICE CHECK Result Date: 11/04/2024 PPM Scheduled remote reviewed. Normal device function.  Presenting rhythm:  AF/AFL/VP Known AF, controlled rates, Eliquis  per PA report Next remote transmission per protocol. 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  AJCC 8th Edition (clinical stage): cT? cN1? cM1a (liver).  No convincing evidence of thoracic or osseous metastatic disease.  Poor functional status and limited physiologic reserve  ECOG performance status 3-4 with frailty, recent functional decline, and multiple advanced comorbidities.   2)  Microcytic Anemia with elevated RDW-- likely multifactorial  Annemia of chronic disease/malignancy, inflammatory state, hepatic involvement/Iron deficiency from GI bleeding.   3) Abnormal LFTs -- due to liver metastases  4) Goals of Care Discussion  A comprehensive and compassionate goals-of-care discussion was held with the patient and her three daughters. The following points were reviewed in detail:  Lysle of the diagnosis as incurable metastatic colorectal cancer.  Prognosis, which is poor given diffuse liver metastases, advanced age, frailty, and comorbid conditions.  Potential benefits and risks of systemic chemotherapy, including low likelihood of meaningful survival benefit and high risk of toxicity, hospitalizations, and further decline in quality of life.  Alternative focus on comfort, symptom control, dignity, and time at home.  The patient and family demonstrated clear understanding and expressed consistent wishes to forego chemotherapy, immunotherapy, or further invasive oncologic testing, and to prioritize comfort-focused care.  The patients code status is DNR/DNI, which is concordant with her goals.  Recommendations / Plan  1. Best Supportive Care / Hospice  Recommend enrollment in hospice services (home hospice or hospice at SNF, depending on disposition) to prioritize comfort, symptom management, and quality of life.  2.  Rationale for No Disease-Directed Systemic Therapy  Systemic chemotherapy, targeted therapy, or immunotherapy is not recommended given:  Advanced age (62 years) with ECOG 3-4 performance status  Extensive hepatic metastatic burden with baseline hepatic dysfunction and hypoalbuminemia  Significant cardiovascular, neurologic, and renal comorbidities  High likelihood of treatment-related toxicity, hospitalization, and further functional decline  Low probability of meaningful survival benefit or quality-of-life improvement  After a detailed discussion of risks, benefits, and alternatives, the patient and family declined disease-directed therapy, which is medically appropriate and concordant with oncology guidelines emphasizing performance status and patient-centered goals.  3. Symptom-Focused Management (in coordination with primary team & hospice)  Pain / abdominal discomfort: Opioids as needed; avoid NSAIDs given renal disease and anticoagulation.  Nausea/dyspepsia: Continue pantoprazole , ondansetron  PRN, antacids as helpful.  Anorexia / weight loss: Liberalized diet; avoid non-beneficial nutritional interventions.  Anemia: No further diagnostic evaluation; consider IV iron/one time transfusion for fatigue related to anemia prior to discharge.  4. Medication Review / Deprescribing  De-escalate non-comfort-focused medications over time.  Glycemic control should be permissive.  Reassess anticoagulation periodically in the context of bleeding risk, falls, and evolving goals.  5. Family Communication  Goals of care reviewed in detail with the patient and her three daughters; consensus achieved to pursue comfort-focused care.  Prognosis  Given the extent of metastatic disease, hepatic involvement, frailty, and decision to pursue comfort-focused care, prognosis is limited (likely months). Emphasis should remain on quality of life, symptom relief, and honoring patient  preferences.  All of the patients questions were answered with apparent satisfaction. The patient knows to call the clinic with any problems, questions or concerns.  .The total time spent in the appointment was 80 minutes* .  All of the patient's questions were answered with apparent satisfaction. The patient knows to call the clinic with any problems, questions or concerns.   Emaline Saran MD MS AAHIVMS [facility] Hematology/Oncology Physician [facility]  .*Total Encounter Time as defined by the Centers for Medicare and Medicaid Services includes, in addition to the face-to-face time of a patient visit (documented in the note above) non-face-to-face time: obtaining and reviewing outside history, ordering and reviewing medications, tests or  procedures, care coordination (communications with other health care professionals or caregivers) and documentation in the medical record. 80  11/11/2024 11:13 PM

## 2024-11-12 DIAGNOSIS — C189 Malignant neoplasm of colon, unspecified: Secondary | ICD-10-CM | POA: Diagnosis not present

## 2024-11-12 DIAGNOSIS — E1165 Type 2 diabetes mellitus with hyperglycemia: Secondary | ICD-10-CM

## 2024-11-12 DIAGNOSIS — N1831 Chronic kidney disease, stage 3a: Secondary | ICD-10-CM | POA: Diagnosis not present

## 2024-11-12 DIAGNOSIS — C787 Secondary malignant neoplasm of liver and intrahepatic bile duct: Secondary | ICD-10-CM | POA: Diagnosis not present

## 2024-11-12 LAB — CBC WITH DIFFERENTIAL/PLATELET
Abs Immature Granulocytes: 0.07 K/uL (ref 0.00–0.07)
Basophils Absolute: 0.1 K/uL (ref 0.0–0.1)
Basophils Relative: 1 %
Eosinophils Absolute: 0.1 K/uL (ref 0.0–0.5)
Eosinophils Relative: 1 %
HCT: 28.5 % — ABNORMAL LOW (ref 36.0–46.0)
Hemoglobin: 8.5 g/dL — ABNORMAL LOW (ref 12.0–15.0)
Immature Granulocytes: 1 %
Lymphocytes Relative: 20 %
Lymphs Abs: 1.7 K/uL (ref 0.7–4.0)
MCH: 25 pg — ABNORMAL LOW (ref 26.0–34.0)
MCHC: 29.8 g/dL — ABNORMAL LOW (ref 30.0–36.0)
MCV: 83.8 fL (ref 80.0–100.0)
Monocytes Absolute: 0.8 K/uL (ref 0.1–1.0)
Monocytes Relative: 10 %
Neutro Abs: 5.8 K/uL (ref 1.7–7.7)
Neutrophils Relative %: 67 %
Platelets: 280 K/uL (ref 150–400)
RBC: 3.4 MIL/uL — ABNORMAL LOW (ref 3.87–5.11)
RDW: 19.1 % — ABNORMAL HIGH (ref 11.5–15.5)
WBC: 8.6 K/uL (ref 4.0–10.5)
nRBC: 0 % (ref 0.0–0.2)

## 2024-11-12 LAB — IRON AND TIBC
Iron: 19 ug/dL — ABNORMAL LOW (ref 28–170)
Saturation Ratios: 6 % — ABNORMAL LOW (ref 10.4–31.8)
TIBC: 349 ug/dL (ref 250–450)
UIBC: 329 ug/dL

## 2024-11-12 LAB — TYPE AND SCREEN
ABO/RH(D): O POS
Antibody Screen: NEGATIVE

## 2024-11-12 LAB — FERRITIN: Ferritin: 228 ng/mL (ref 11–307)

## 2024-11-12 LAB — GLUCOSE, CAPILLARY
Glucose-Capillary: 188 mg/dL — ABNORMAL HIGH (ref 70–99)
Glucose-Capillary: 311 mg/dL — ABNORMAL HIGH (ref 70–99)
Glucose-Capillary: 242 mg/dL — ABNORMAL HIGH (ref 70–99)
Glucose-Capillary: 127 mg/dL — ABNORMAL HIGH (ref 70–99)

## 2024-11-12 NOTE — TOC Progression Note (Signed)
 Transition of Care [facility]) - Progression Note    Patient Details  Name: Amanda Stewart MRN: [id number] Date of Birth: April 30, 1941  Transition of Care [facility]) CM/SW Contact  Marval Gell, RN Phone Number: 11/12/2024, 10:26 AM  Clinical Narrative:     Beatris w daughter, Alfonso, to discuss plans for DC. She states that her mother and the family have discussed going home on hospice services, and they would now prefer that to SNF.  She states they will have support between herself and her sister Angelea with whom she will be staying. She states that the patient will go to Angelea's house at [address], [facility] [address].  They will need a hospital bed. Patient is RA, and has a RW and 3/1 at home. They would like to transport by private car, and have bed set up at home before she is discharged. We discussed providers and they named [facility].  Referral made to [facility] and requested delivery of bed today to help expedite DC if it is deemed appropriate by attending.   [facility] Daughter, Emergency Contact [phone]   Expected Discharge Plan: Home w Hospice Care Barriers to Discharge: Continued Medical Work up               Expected Discharge Plan and Services In-house Referral: Clinical Social Work Discharge Planning Services: CM Consult   Living arrangements for the past 2 months: Single Family Home                                       Social Drivers of Health (SDOH) Interventions SDOH Screenings   Food Insecurity: No Food Insecurity (11/05/2024)  Housing: Unknown (11/05/2024)  Transportation Needs: No Transportation Needs (11/05/2024)  Utilities: Not At Risk (11/05/2024)  Social Connections: Unknown (11/05/2024)  Tobacco Use: Low Risk (11/04/2024)    Readmission Risk Interventions     No data to display

## 2024-11-12 NOTE — Progress Notes (Signed)
 Pt sitting on bsc had a very large BM, then vomited x1. Zofran  given, pt verbalized she felt better. NAD noted or voiced concerns, will continue to monitor for any additional needs.

## 2024-11-12 NOTE — Progress Notes (Signed)
 PROGRESS NOTE    Amanda Stewart  FMW:[id number] DOB: 01-16-1941 DOA: 11/04/2024 PCP: Shelda Atlas, MD   Brief Narrative: Amanda Stewart is a 83 y.o. female with a history of carotid artery stenosis status post TCAR, chronic atrial fibrillation, complete heart block status post pacemaker placement, diabetes mellitus type 2, chronic anemia.  Patient presented secondary to abdominal pain with initial evidence of liver lesions concerning for cancer of unknown etiology.  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  Medical oncology was consulted and after discussion with patient and family, decision was made to transition to comfort measures with enrollment with hospice at home.   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  Medical oncology was consulted and after discussion with patient and family at bedside, decision was made to transition to comfort measures and to discharge home with hospice care  GERD -Continue Protonix   Chronic atrial fibrillation -Continue metoprolol  and Eliquis   History of complete heart block Patient is s/p pacemaker placement.  History of CVA Noted. -Continue Eliquis  and aspirin   Chronic microctic anemia Stable.  Diabetes mellitus type 2 Uncontrolled based on hemoglobin A1C of 10.1%. -Continue SSI  CKD stage IIIa Stable.  Primary hypertension -Continue losartan  and metoprolol  tartrate  Orthostatic hypotension Resolved with IV fluids. Antihypertensives continued.  Possible cognitive impairment Complicated by stroke history. Concern for family. Recommendation for outpatient follow-up with neurology.    DVT prophylaxis: Eliquis  Code Status:   Code Status: Limited: Do not attempt resuscitation (DNR)  -DNR-LIMITED -Do Not Intubate/DNI  Family Communication: None at bedside Disposition Plan: Discharge home with hospice likely in 24 hours pending delivery of equipment.   Consultants:  Interventional radiology Medical oncology  Procedures:  US  guided core biopsy of liver lesion   Antimicrobials: Ceftriaxone     Subjective: Patient reports no concerns this morning.  Objective: BP (!) 106/92 (BP Location: Left Arm)   Pulse 70   Temp 97.9 F (36.6 C) (Oral)   Resp 20   Ht 5' 4 (1.626 m)   Wt 71.8 kg Comment: Wt from 07/17/2024  SpO2 100%   BMI 27.17 kg/m   Examination:  General exam: Appears calm and comfortable. Respiratory system: Clear to auscultation. Respiratory effort normal. Cardiovascular system: S1 & S2 heard Gastrointestinal system: Abdomen is nondistended, soft and nontender. Normal bowel sounds heard. Central nervous system: Alert.    Data Reviewed: I have personally reviewed following labs and imaging studies  CBC Lab Results  Component Value Date   WBC 8.6 11/12/2024   RBC 3.40 (L) 11/12/2024   HGB 8.5 (L) 11/12/2024   HCT 28.5 (L) 11/12/2024   MCV 83.8 11/12/2024   MCH 25.0 (L) 11/12/2024   PLT 280 11/12/2024   MCHC 29.8 (L) 11/12/2024   RDW 19.1 (H) 11/12/2024   LYMPHSABS 1.7 11/12/2024   MONOABS 0.8 11/12/2024   EOSABS 0.1 11/12/2024   BASOSABS 0.1 11/12/2024     Last metabolic panel Lab Results  Component Value Date   NA 137 11/08/2024   K 3.9 11/08/2024   CL 103 11/08/2024   CO2 30 11/08/2024   BUN 15 11/08/2024   CREATININE 1.07 (H) 11/08/2024   GLUCOSE 199 (H) 11/08/2024   GFRNONAA 51 (L) 11/08/2024   GFRAA 69 01/05/2021   CALCIUM  8.5 (L) 11/08/2024   PHOS 4.1 05/29/2014   PROT  5.6 (L) 11/08/2024   ALBUMIN 2.7 (L) 11/08/2024   BILITOT 0.8 11/08/2024   ALKPHOS 497 (H) 11/08/2024   AST 51 (H) 11/08/2024   ALT 50 (H) 11/08/2024   ANIONGAP 4 (L) 11/08/2024    GFR: Estimated Creatinine Clearance: 37.3 mL/min (A) (by C-G  formula based on SCr of 1.07 mg/dL (H)).  No results found for this or any previous visit (from the past 240 hours).    Radiology Studies: No results found.    LOS: 6 days    Elgin Lam, MD [facility] 11/12/2024, 7:39 AM   If 7PM-7AM, please contact night-coverage [email or web address]

## 2024-11-12 NOTE — Progress Notes (Addendum)
 [facility] 7T62 [facility] Liaison Note  Received request from Debbie, Care Manager, for hospice services at home after discharge. Spoke with daughter Alfonso to initiate education related to hospice philosophy, services, and team approach to care. Patient/family verbalized understanding of information given. Per discussion, the plan is for discharge home by private vehicle once DME is delivered.  DME needs discussed. Patient has the following equipment in the home: walker, rollator, BSC, shower chair. Patient/family requests the following equipment for delivery: hospital bed The address patient will discharge to is other daughter's home. [address] [place] [address]  Alfonso is the family contact to arrange time of equipment delivery. Please send signed and completed DNR home with patient/family. Please provide prescriptions at discharge as needed to ensure ongoing symptom management.  [facility] information and contact numbers given to: Alfonso. Above information shared with Name, Care Manager. Please call with any questions or concerns. Thank you for the opportunity to participate in this patient's care.  Eleanor Nail, LPN [facility] Liaison [phone]

## 2024-11-13 ENCOUNTER — Ambulatory Visit: Payer: Self-pay | Admitting: Cardiology

## 2024-11-13 DIAGNOSIS — C189 Malignant neoplasm of colon, unspecified: Secondary | ICD-10-CM | POA: Diagnosis not present

## 2024-11-13 DIAGNOSIS — C787 Secondary malignant neoplasm of liver and intrahepatic bile duct: Secondary | ICD-10-CM | POA: Diagnosis not present

## 2024-11-13 LAB — CEA: CEA: 35.2 ng/mL — ABNORMAL HIGH (ref 0.0–4.7)

## 2024-11-13 LAB — GLUCOSE, CAPILLARY
Glucose-Capillary: 131 mg/dL — ABNORMAL HIGH (ref 70–99)
Glucose-Capillary: 283 mg/dL — ABNORMAL HIGH (ref 70–99)

## 2024-11-13 NOTE — Discharge Instructions (Signed)
 Amanda Stewart,  You were in the hospital and found to have evidence of newly diagnosed cancer. You have elected to enroll in hospice. No new changes are recommended.

## 2024-11-13 NOTE — TOC Transition Note (Signed)
 Transition of Care [facility]) - Discharge Note   Patient Details  Name: Amanda Stewart MRN: [id number] Date of Birth: July 12, 1941  Transition [facility] Care [facility]) CM/SW Contact:  Corean JAYSON Canary, RN Phone Number: 11/13/2024, 12:19 PM   Clinical Narrative:     Notified from [facility] hospice that the equipment has been delivered,  The family plan on picking the patient up and taking her to daughters house with hospice.   Final next level of care: Home w Hospice Care Discharge when ready to home hospice Barriers to Discharge: No Barriers Identified   Patient Goals and CMS Choice Patient states their goals for this hospitalization and ongoing recovery are:: home to daughters house CMS Medicare.gov Compare Post Acute Care list provided to:: Other (Comment Required) Choice offered to / list presented to : Adult Children, Patient      Discharge Placement                       Discharge Plan and Services Additional resources added to the After Visit Summary for   In-house Referral: Clinical Social Work Discharge Planning Services: CM Consult                                 Social Drivers of Health (SDOH) Interventions SDOH Screenings   Food Insecurity: No Food Insecurity (11/05/2024)  Housing: Unknown (11/05/2024)  Transportation Needs: No Transportation Needs (11/05/2024)  Utilities: Not At Risk (11/05/2024)  Social Connections: Unknown (11/05/2024)  Tobacco Use: Low Risk (11/04/2024)     Readmission Risk Interventions     No data to display

## 2024-11-13 NOTE — Progress Notes (Signed)
 Physical Therapy Treatment Patient Details Name: Amanda Stewart MRN: [id number] DOB: 01/13/1941 Today's Date: 11/13/2024   History of Present Illness Pt is 83 yo presenting to [facility] due to abdominal pain. MRI with concerns for metastatic disease per MD note on 12/10. PMH: CVA, carotid stenosis s/p TCAR, chronic a-fib, complete heart block s/p pacemaker placement, DM II, chronic anemia.    PT Comments  Pt received in supine and agreeable to session. Pt requires grossly min A for bed mobility and transfers due to weakness and impaired balance. Pt demonstrates a posterior lean sitting EOB initially, but improves with cues and positioning. Pt able to step to chair and perform static standing marches, but is limited by quick fatigue and requires a seated rest break. Pt continues to benefit from PT services to progress toward functional mobility goals.     If plan is discharge home, recommend the following: Assistance with cooking/housework;Assist for transportation;Help with stairs or ramp for entrance   Can travel by private vehicle     Yes  Equipment Recommendations  Rolling walker (2 wheels);BSC/3in1    Recommendations for Other Services       Precautions / Restrictions Precautions Precautions: Fall Recall of Precautions/Restrictions: Impaired Restrictions Weight Bearing Restrictions Per Provider Order: No     Mobility  Bed Mobility Overal bed mobility: Needs Assistance Bed Mobility: Supine to Sit     Supine to sit: Min assist     General bed mobility comments: assist for trunk elevation    Transfers Overall transfer level: Needs assistance Equipment used: Rolling walker (2 wheels) Transfers: Bed to chair/wheelchair/BSC, Sit to/from Stand Sit to Stand: Min assist   Step pivot transfers: Min assist       General transfer comment: From EOB and recliner with min A for rise and cues for anterior weight shift and hand placement. Pivot to recliner with  min A for balance     Ambulation/Gait             Pre-gait activities: static standing Conservation Officer, Historic Buildings     Tilt Bed    Modified Rankin (Stroke Patients Only)       Balance Overall balance assessment: Needs assistance Sitting-balance support: No upper extremity supported, Feet supported Sitting balance-Leahy Scale: Fair Sitting balance - Comments: posterior lean initially progressing to close supervision   Standing balance support: Bilateral upper extremity supported, During functional activity Standing balance-Leahy Scale: Poor Standing balance comment: reliant on RW                            Communication Communication Communication: Impaired Factors Affecting Communication: Hearing impaired  Cognition Arousal: Alert Behavior During Therapy: WFL for tasks assessed/performed   PT - Cognitive impairments: No family/caregiver present to determine baseline, Safety/Judgement, Problem solving, Memory                       PT - Cognition Comments: Pt initially asking for help with breakfast, but stating she is finished at end of session. Following commands: Intact      Cueing Cueing Techniques: Verbal cues, Gestural cues, Tactile cues, Visual cues  Exercises General Exercises - Lower Extremity Hip Flexion/Marching: AROM, Standing, Both, 5 reps (x2)    General Comments        Pertinent Vitals/Pain Pain Assessment Pain Assessment: No/denies pain  PT Goals (current goals can now be found in the care plan section) Acute Rehab PT Goals Patient Stated Goal: to go home PT Goal Formulation: With patient Time For Goal Achievement: 11/20/24 Progress towards PT goals: Progressing toward goals    Frequency    Min 2X/week       AM-PAC PT 6 Clicks Mobility   Outcome Measure  Help needed turning from your back to your side while in a flat bed without using bedrails?: A Little Help needed moving from lying on  your back to sitting on the side of a flat bed without using bedrails?: A Little Help needed moving to and from a bed to a chair (including a wheelchair)?: A Little Help needed standing up from a chair using your arms (e.g., wheelchair or bedside chair)?: A Little Help needed to walk in hospital room?: A Lot Help needed climbing 3-5 steps with a railing? : Total 6 Click Score: 15    End of Session   Activity Tolerance: Patient tolerated treatment well;Patient limited by fatigue Patient left: with call bell/phone within reach;in chair;with chair alarm set Nurse Communication: Mobility status PT Visit Diagnosis: Unsteadiness on feet (R26.81);Other abnormalities of gait and mobility (R26.89);Muscle weakness (generalized) (M62.81);Repeated falls (R29.6)     Time: 9148-9089 PT Time Calculation (min) (ACUTE ONLY): 19 min  Charges:    $Therapeutic Activity: 8-22 mins PT General Charges $$ ACUTE PT VISIT: 1 Visit                    Darryle George, PTA Acute Rehabilitation Services Secure Chat Preferred  [phone]    Darryle George 11/13/2024, 9:42 AM

## 2024-11-13 NOTE — Discharge Summary (Signed)
 Physician Discharge Summary   Patient: Amanda Stewart MRN: [id number] DOB: June 04, 1941  Admit date:     11/04/2024  Discharge date: 11/13/2024  Discharge Physician: Elgin Lam, MD   PCP: Shelda Atlas, MD   Recommendations at discharge:  Hospice at home  Discharge Diagnoses: Principal Problem:   Abdominal pain Active Problems:   CAD (coronary artery disease)   Diabetes mellitus, type 2 (HCC)   Complete heart block (HCC)   Pacemaker   Chronic kidney disease, stage 3a (HCC)   Hypertension associated with diabetes (HCC)   Hyperlipidemia associated with type 2 diabetes mellitus (HCC)   PAD (peripheral artery disease)   Paroxysmal atrial fibrillation (HCC)   Liver mass   Elevated LFTs   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 [facility] Course: Amanda Stewart is a 83 y.o. female with a history of carotid artery stenosis status post TCAR, chronic atrial fibrillation, complete heart block status post pacemaker placement, diabetes mellitus type 2, chronic anemia.  Patient presented secondary to abdominal pain with initial evidence of liver lesions concerning for cancer of unknown etiology.  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  Medical oncology was consulted and after discussion with patient and family, decision was made to transition to comfort measures with enrollment with hospice at home.  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  Medical oncology was consulted and after discussion with patient and family at bedside, decision was made to transition to comfort measures and to discharge home with hospice care.   GERD Continue Protonix .    Chronic atrial fibrillation Continue metoprolol  and Eliquis .   History of complete heart block Patient is s/p pacemaker placement.   History of CVA Noted. Continue Eliquis  and aspirin .   Chronic microctic anemia Stable.   Diabetes mellitus type 2 Uncontrolled based on hemoglobin A1C of 10.1%. Continue Jardiance .   CKD stage IIIa Stable.   Primary hypertension Continue losartan  and metoprolol  tartrate.   Orthostatic hypotension Resolved with IV fluids. Antihypertensives continued.   Possible cognitive impairment Complicated by stroke history. Concern for family. Could consider neurology follow-up, however patient has accepted hospice care at home. May not be beneficial to workup at this time.   Consultants:  Interventional radiology Medical oncology   Procedures:  US  guided core biopsy of liver lesion   Disposition: Hospice care Diet recommendation: Regular diet   DISCHARGE MEDICATION: Allergies as of 11/13/2024       Reactions   Metformin  And Related Other (See Comments)   Diarrhea         Medication List     STOP taking these medications    amLODipine  5 MG tablet Commonly known as: NORVASC    chlorhexidine  0.12 % solution Commonly known as: PERIDEX    potassium chloride  10 MEQ tablet Commonly known as: KLOR-CON        TAKE these medications    acetaminophen  325 MG tablet Commonly known as: TYLENOL  Take 1-2 tablets (325-650 mg total) by mouth every 4 (four) hours as needed for mild pain. What changed:  when to take this reasons to take this   albuterol  108 (90 Base) MCG/ACT inhaler Commonly known as: VENTOLIN  HFA Inhale 2 puffs into the lungs every 6 (six) hours as needed for  wheezing or shortness of breath.   aspirin  EC 81 MG tablet Take 81 mg by mouth daily.   atorvastatin  80 MG tablet Commonly known as: LIPITOR  Take 1 tablet (80 mg total) by mouth daily.   Eliquis  5 MG Tabs tablet Generic drug: apixaban  TAKE 1 TABLET(5 MG) BY  MOUTH TWICE DAILY   escitalopram  5 MG tablet Commonly known as: LEXAPRO  Take 5 mg by mouth at bedtime.   Jardiance  25 MG Tabs tablet Generic drug: empagliflozin  Take 25 mg by mouth daily.   losartan  100 MG tablet Commonly known as: COZAAR  Take 100 mg by mouth daily.   metoprolol  tartrate 25 MG tablet Commonly known as: LOPRESSOR  Take 1 tablet (25 mg total) by mouth 2 (two) times daily.   multivitamin with minerals Tabs tablet Take 1 tablet by mouth daily.   nitroGLYCERIN  0.4 MG SL tablet Commonly known as: NITROSTAT  Place 1 tablet (0.4 mg total) under the tongue every 5 (five) minutes x 3 doses as needed for chest pain.   pantoprazole  40 MG tablet Commonly known as: PROTONIX  Take 1 tablet (40 mg total) by mouth daily at 6 (six) AM.        Contact information for follow-up providers     [facility] Follow up.   Specialty: Hospice and Palliative Medicine Why: For home hospice services Contact information: [address] [place]  [address] [phone]             Contact information for after-discharge care     Destination     [facility] Preferred SNF .   Service: Skilled Nursing Contact information: [address] [place]  [phone]                    Discharge Exam: BP (!) 115/98 (BP Location: Left Arm)   Pulse 68   Temp 98 F (36.7 C) (Oral)   Resp 16   Ht 5' 4 (1.626 m)   Wt 71.8 kg Comment: Wt from 07/17/2024  SpO2 98%   BMI 27.17 kg/m   General exam: Appears calm and comfortable. Respiratory system: Respiratory effort normal. Central nervous system: Alert and oriented. Musculoskeletal: No edema. No calf tenderness Skin: No cyanosis. No rashes    Condition at discharge: stable  The results of significant diagnostics from this hospitalization (including imaging, microbiology, ancillary and laboratory) are listed below for reference.   Imaging  Studies: US  BIOPSY (LIVER) Result Date: 11/07/2024 INDICATION: 83 year old female with extensive metastatic hepatic disease. She presents for ultrasound-guided core biopsy to confirm tissue diagnosis. EXAM: ULTRASOUND BIOPSY CORE LIVER MEDICATIONS: None. ANESTHESIA/SEDATION: Moderate (conscious) sedation was employed during this procedure. A total of Versed  1 mg and Fentanyl  50 mcg was administered intravenously by the Radiology nurse. Moderate Sedation Time: 11 minutes. The patient's level of consciousness and vital signs were monitored continuously by radiology nursing throughout the procedure under my direct supervision. FLUOROSCOPY TIME:  None. COMPLICATIONS: None immediate. PROCEDURE: Informed written consent was obtained from the patient after a thorough discussion of the procedural risks, benefits and alternatives. All questions were addressed. Maximal Sterile Barrier Technique was utilized including caps, mask, sterile gowns, sterile gloves, sterile drape, hand hygiene and skin antiseptic. A timeout was performed prior to the initiation of the procedure. The right upper quadrant was interrogated with ultrasound. Multiple hypoechoic solid masses. A suitable skin entry site was selected and marked. The region was sterilely prepped and draped in the standard fashion using chlorhexidine  skin prep. Local anesthesia  was attained by infiltration with 1% lidocaine . A small dermatotomy was made. Under real-time sonographic guidance, a 17 gauge introducer needle was advanced into the margin of the mass. Multiple 18 gauge biopsies were then coaxially obtained using the BioPince automated biopsy device. Biopsy samples were placed in formalin and delivered to pathology for further analysis. As a 17 gauge introducer needle was removed, the biopsy tract was embolized with a Gel-Foam slurry. Post biopsy ultrasound imaging demonstrates no evidence of active hemorrhage or perihepatic hematoma. The patient tolerated the  procedure well. IMPRESSION: Technically successful ultrasound-guided core biopsy of hepatic lesion. Electronically Signed   By: Wilkie Lent M.D.   On: 11/07/2024 12:53   CT CHEST WO CONTRAST Result Date: 11/05/2024 EXAM: CT CHEST WITHOUT CONTRAST 11/05/2024 12:37:11 PM TECHNIQUE: CT of the chest was performed without the administration of intravenous contrast. Multiplanar reformatted images are provided for review. Automated exposure control, iterative reconstruction, and/or weight based adjustment of the mA/kV was utilized to reduce the radiation dose to as low as reasonably achievable. COMPARISON: CT of the abdomen and pelvis dated 11/04/2024. CLINICAL HISTORY: Metastasis evaluation, Patient with liver lesions/metastases and possible colon cancer, evaluate Left lung lesion/hyperdensity. FINDINGS: MEDIASTINUM: Heart: Normal in size. Moderate calcific coronary artery disease. A cardiac pacemaker is present. Pericardium is unremarkable. The central airways are clear. The thoracic aorta demonstrates moderate calcific atheromatous disease. There is a vascular stent within the right common carotid artery. LYMPH NODES: No mediastinal, hilar or axillary lymphadenopathy. LUNGS AND PLEURA: There is a patchy area of ground-glass and subsolid opacities present posteromedially within the left lower lobe on images 80 through 88 of series 3, which appeared most likely to be infectious or inflammatory. There are streaky linear opacities present medially within the right lower lobe, likely representing compressive atelectasis from the thoracic osteophytes. There is minimal dependent atelectasis. The lungs are clear otherwise. No pleural effusion or pneumothorax. SOFT TISSUES/BONES: No acute abnormality of the bones or soft tissues. There are no osseous lesions concerning for metastatic disease. UPPER ABDOMEN: Numerous lesions again demonstrated within the liver highly concerning for hepatic metastatic disease. Limited  images of the upper abdomen demonstrates no acute abnormality. IMPRESSION: 1. Patchy ground-glass and subsolid opacities in the left lower lobe, likely infectious or inflammatory. Attention on follow up is suggested. 2. Streaky linear opacities in the right lower lobe, likely compressive atelectasis from thoracic osteophytes. 3. Numerous liver lesions highly concerning for hepatic metastatic disease. 4. No thoracic lymphadenopathy or osseous lesions to suggest metastatic disease; no convincing evidence of thoracic metastatic disease. Electronically signed by: Evalene Coho MD 11/05/2024 01:04 PM EST RP Workstation: [id number]   MR LIVER W WO CONTRAST Result Date: 11/05/2024 CLINICAL DATA:  Abdominal pain. Elevated bilirubin. Hepatic masses on recent CT. EXAM: MRI ABDOMEN WITHOUT AND WITH CONTRAST TECHNIQUE: Multiplanar multisequence MR imaging of the abdomen was performed both before and after the administration of intravenous contrast. CONTRAST:  7mL GADAVIST  GADOBUTROL  1 MMOL/ML IV SOLN COMPARISON:  CT on 11/04/2024 FINDINGS: Lower chest: Focal ill-defined T2 hyperintensity is seen in the posterior left lower lobe which is new since recent CT, suspicious for infectious or inflammatory etiology. Hepatobiliary: Numerous heterogeneous hypovascular masses are seen throughout the liver, consistent with diffuse liver metastases. Largest index mass in the lateral right hepatic lobe measures 5.2 x 4.1 cm. T1 hyperintense gallbladder sludge is seen, without signs of cholecystitis. No evidence of biliary ductal dilatation. Pancreas:  No mass or inflammatory changes. Spleen:  Within normal limits in size and appearance.  Adrenals/Urinary Tract: Tiny benign-appearing Bosniak category 2 left renal cyst noted (No followup imaging is recommended) . No suspicious masses identified. No evidence of hydronephrosis. Stomach/Bowel: A short segment area of annular wall thickening is seen in the proximal ascending colon measuring  approximately 5 cm in length, highly suspicious for primary colon carcinoma. No evidence of bowel obstruction. Vascular/Lymphatic: Several borderline enlarged right pericolonic lymph nodes are also seen, suspicious for local lymph node metastases. No other sites of lymphadenopathy identified. No acute vascular findings. Other:  None. Musculoskeletal:  No suspicious bone lesions identified. IMPRESSION: Diffuse liver metastases. Short segment annular wall thickening in the proximal ascending colon, highly suspicious for primary colon carcinoma. Several borderline enlarged right pericolonic lymph nodes, suspicious for local lymph node metastases. Focal ill-defined T2 hyperintensity in the posterior left lower lobe, new since recent CT, suspicious for pneumonia or other inflammatory etiology. Consider further evaluation with chest radiographs or chest CT. Electronically Signed   By: Norleen DELENA Kil M.D.   On: 11/05/2024 11:11   CT ABDOMEN PELVIS W CONTRAST Result Date: 11/04/2024 EXAM: CT ABDOMEN AND PELVIS WITH CONTRAST 11/04/2024 08:23:52 PM TECHNIQUE: CT of the abdomen and pelvis was performed with the administration of intravenous contrast (75 mL iohexol  (OMNIPAQUE ) 350 MG/ML injection). Multiplanar reformatted images are provided for review. Automated exposure control, iterative reconstruction, and/or weight-based adjustment of the mA/kV was utilized to reduce the radiation dose to as low as reasonably achievable. COMPARISON: None available. CLINICAL HISTORY: abd pain, elevated bilirubin, vomiting FINDINGS: LOWER CHEST: Aortic valve leaflet calcification. Partially visualized cardiac implantable electronic device (defibrillator). LIVER: Multiple lobulated fluid density lesions within the liver that appears slightly heterogeneous centrally with some vascularity running through the lesions. GALLBLADDER AND BILE DUCTS: Gallbladder is unremarkable. No biliary ductal dilatation. SPLEEN: Multiple subcentimeter splenules  noted. The spleen is unremarkable. PANCREAS: No acute abnormality. ADRENAL GLANDS: Bilateral adrenal hyperplasia with no definite nodularity. KIDNEYS, URETERS AND BLADDER: No stones in the kidneys or ureters. No hydronephrosis. No perinephric or periureteral stranding. Urinary bladder is unremarkable. GI AND BOWEL: Small hiatal hernia. No small or large bowel thickening or dilatation. The appendix is unremarkable. PERITONEUM AND RETROPERITONEUM: No ascites. No free air. VASCULATURE: Severe atherosclerotic plaque of the aorta. LYMPH NODES: No lymphadenopathy. REPRODUCTIVE ORGANS: No acute abnormality. BONES AND SOFT TISSUES: Multilevel degenerative changes of the spine. No acute osseous abnormality. No focal soft tissue abnormality. IMPRESSION: 1. No acute findings in the abdomen or pelvis. 2. Multiple lobulated fluid-density hepatic lesions, slightly heterogeneous centrally with internal vascularity; recommend dedicated liver MRI with contrast for characterization. 3. Other, non-acute and/or normal findings as above. Electronically signed by: Kate Plummer MD 11/04/2024 08:34 PM EST RP Workstation: [id number]   US  Abdomen Limited Result Date: 11/04/2024 EXAM: Right Upper Quadrant Abdominal Ultrasound 11/04/2024 07:37:00 PM TECHNIQUE: Real-time ultrasonography of the right upper quadrant of the abdomen was performed. COMPARISON: None available. CLINICAL HISTORY: RUQ US  RUQ US  FINDINGS: LIVER: The liver demonstrates normal echogenicity. No intrahepatic biliary ductal dilatation. Multiple hypoechoic lesions noted in the liver, particularly the left hepatic lobe measuring up to 2.4 cm. These appear to reflect solid mass lesions. BILIARY SYSTEM: No pericholecystic fluid or wall thickening. No cholelithiasis. Common bile duct is within normal limits measuring 4 mm. OTHER: No right upper quadrant ascites. IMPRESSION: 1. Multiple hypoechoic solid liver masses, up to 2.4 cm, suspicious for hepatic neoplasm; recommend  dedicated multiphasic liver MRI or contrast-enhanced CT for further characterization. Electronically signed by: Franky Crease MD 11/04/2024 07:40 PM EST RP Workstation: [id number]  DG Chest 1 View Result Date: 11/04/2024 EXAM: 1 VIEW(S) XRAY OF THE CHEST 11/04/2024 06:13:00 PM COMPARISON: 11/10/2023. CLINICAL HISTORY: cough FINDINGS: LUNGS AND PLEURA: No focal pulmonary opacity. No pleural effusion. No pneumothorax. HEART AND MEDIASTINUM: Left pacer remains in place, unchanged. BONES AND SOFT TISSUES: No acute osseous abnormality. IMPRESSION: 1. No acute cardiopulmonary process. 2. Left pacer in place without interval change. Electronically signed by: Franky Crease MD 11/04/2024 06:34 PM EST RP Workstation: [id number]   CUP PACEART REMOTE DEVICE CHECK Result Date: 11/04/2024 PPM Scheduled remote reviewed. Normal device function.  Presenting rhythm:  AF/AFL/VP Known AF, controlled rates, Eliquis  per PA report Next remote transmission per protocol. LA, CVRS   Microbiology: Results for orders placed or performed during the hospital encounter of 08/29/23  Surgical pcr screen     Status: None   Collection Time: 09/03/23  1:06 PM   Specimen: Nasal Mucosa; Nasal Swab  Result Value Ref Range Status   MRSA, PCR NEGATIVE NEGATIVE Final   Staphylococcus aureus NEGATIVE NEGATIVE Final    Comment: (NOTE) The Xpert SA Assay (FDA approved for NASAL specimens in patients 29 years of age and older), is one component of a comprehensive surveillance program. It is not intended to diagnose infection nor to guide or monitor treatment. Performed at [facility], [address] [place], [place] [address]     Labs: CBC: Recent Labs  Lab 11/06/24 1948 11/07/24 0457 11/08/24 0456 11/09/24 0530 11/12/24 0426  WBC 7.7 7.7 7.3 8.0 8.6  NEUTROABS  --   --   --   --  5.8  HGB 7.5* 7.9* 7.8* 8.7* 8.5*  HCT 25.8* 26.9* 26.3* 29.2* 28.5*  MCV 84.3 84.3 84.0 82.5 83.8  PLT 256 242 228 263 280   Basic  Metabolic Panel: Recent Labs  Lab 11/07/24 0457 11/08/24 0456  NA 138 137  K 3.9 3.9  CL 100 103  CO2 25 30  GLUCOSE 155* 199*  BUN 16 15  CREATININE 1.08* 1.07*  CALCIUM  8.9 8.5*   Liver Function Tests: Recent Labs  Lab 11/07/24 0457 11/08/24 0456  AST 59* 51*  ALT 49* 50*  ALKPHOS 490* 497*  BILITOT 0.9 0.8  PROT 5.9* 5.6*  ALBUMIN 2.9* 2.7*   CBG: Recent Labs  Lab 11/12/24 1144 11/12/24 1604 11/12/24 1916 11/13/24 0759 11/13/24 1115  GLUCAP 311* 242* 127* 131* 283*    Discharge time spent: 35 minutes.  Signed: Elgin Lam, MD [facility] 11/13/2024

## 2024-11-13 NOTE — Progress Notes (Signed)
 Speech Language Pathology Treatment: Cognitive-Linguistic  Patient Details Name: Amanda Stewart MRN: [id number] DOB: Mar 25, 1941 Today's Date: 11/13/2024 Time: 1205-1226 SLP Time Calculation (min) (ACUTE ONLY): 21 min  Assessment / Plan / Recommendation Clinical Impression  Pt seen for cognitive therapy targeting memory and problem solving. Pt states daughter keeps up with appointments however pt has a calendar and encouraged her to record appointments and participate in recall. Pt took notes while listening to a simulated voicemail message presented by therapist and needed max cues for repetition for accurate information. She looked at calendar in room and accurately stated date, month, day of week and year. She problem solved hypothetical temporal situation re: when able to get next scheduled med without cues and needed one cue for problem re: when to leave for appointment given length of time to get there. Pt scheduled to be discharged home with hospice today- no further ST recommended.   HPI HPI: Pt is 83 yo presenting to [facility] due to abdominal pain. MRI with concerns for metastatic disease per MD note on 12/10. PMH: CVA, carotid stenosis s/p TCAR, chronic a-fib, complete heart block s/p pacemaker placement, DM II, chronic anemia. Cognitive evaluation completed 08/30/24 where patient scored 24/26.      SLP Plan  Other (Comment) (discharging today with hospice- no f/u rec'd)        Swallow Evaluation Recommendations         Recommendations                     Oral care BID   Frequent or constant Supervision/Assistance Cognitive communication deficit (R41.841)     Other (Comment) (discharging today with hospice- no f/u rec'd)     Dustin Olam Bull  11/13/2024, 1:40 PM

## 2024-12-28 DEATH — deceased

## 2025-01-26 ENCOUNTER — Ambulatory Visit: Admitting: Adult Health
